# Patient Record
Sex: Male | Born: 1935 | Race: Black or African American | Hispanic: No | Marital: Married | Smoking: Former smoker
Health system: Southern US, Community
[De-identification: ages and names within clinical notes are randomized; demographics above are authoritative.]

## PROBLEM LIST (undated history)

## (undated) DIAGNOSIS — N189 Chronic kidney disease, unspecified: Secondary | ICD-10-CM

## (undated) DIAGNOSIS — M549 Dorsalgia, unspecified: Secondary | ICD-10-CM

## (undated) DIAGNOSIS — C801 Malignant (primary) neoplasm, unspecified: Secondary | ICD-10-CM

## (undated) DIAGNOSIS — E7800 Pure hypercholesterolemia, unspecified: Secondary | ICD-10-CM

## (undated) DIAGNOSIS — I10 Essential (primary) hypertension: Secondary | ICD-10-CM

## (undated) DIAGNOSIS — G8929 Other chronic pain: Secondary | ICD-10-CM

## (undated) DIAGNOSIS — I714 Abdominal aortic aneurysm, without rupture, unspecified: Secondary | ICD-10-CM

## (undated) HISTORY — DX: Abdominal aortic aneurysm, without rupture, unspecified: I71.40

## (undated) HISTORY — PX: MULTIPLE TOOTH EXTRACTIONS: SHX2053

---

## 2015-08-01 ENCOUNTER — Encounter: Payer: Self-pay | Admitting: Pulmonary Disease

## 2016-07-26 ENCOUNTER — Encounter (HOSPITAL_COMMUNITY): Payer: Self-pay | Admitting: Emergency Medicine

## 2016-07-26 ENCOUNTER — Ambulatory Visit (HOSPITAL_COMMUNITY)
Admission: EM | Admit: 2016-07-26 | Discharge: 2016-07-26 | Disposition: A | Discharge status: Home / Self Care | Payer: Medicare Other | Attending: Family Medicine | Admitting: Family Medicine

## 2016-07-26 DIAGNOSIS — R42 Dizziness and giddiness: Secondary | ICD-10-CM

## 2016-07-26 HISTORY — DX: Other chronic pain: G89.29

## 2016-07-26 HISTORY — DX: Dorsalgia, unspecified: M54.9

## 2016-07-26 HISTORY — DX: Pure hypercholesterolemia, unspecified: E78.00

## 2016-07-26 HISTORY — DX: Essential (primary) hypertension: I10

## 2016-07-26 MED ORDER — MECLIZINE HCL 12.5 MG PO TABS: 12.5000 mg | ORAL_TABLET | Freq: Three times a day (TID) | ORAL | 0 refills | Status: DC | PRN

## 2016-07-26 NOTE — ED Triage Notes (Signed)
The patient presented to the [facility] with a complaint of episodes of dizziness that started last week. The patient reported 2 episodes.

## 2016-07-26 NOTE — Discharge Instructions (Signed)
Follow up with your doctor if symptoms return

## 2016-07-26 NOTE — ED Provider Notes (Signed)
[place]    CSN: [id number] Arrival date & time: 07/26/16  1026     History   Chief Complaint Chief Complaint  Patient presents with  . Dizziness    HPI TARA Danny Duke is a 81 y.o. male.   This 81 year old man who presents to the [facility] urgent care center for evaluation of dizziness. He said he had a similar episode last week but it resolved after several hours. The episode that occurred this morning only lasted a few hours and is getting better. It was positional.  He's had no significant ear pain or loss of hearing. Said no fever. Said no head trauma.  The patient was out of town last week on the drive to [place].  Patient has associated problems of hypertension, chronic pain, and hyperlipidemia.      Past Medical History:  Diagnosis Date  . Chronic back pain   . Hypercholesterolemia   . Hypertension     There are no active problems to display for this patient.   History reviewed. No pertinent surgical history.     Home Medications    Prior to Admission medications   Medication Sig Start Date End Date Taking? Authorizing Provider  amLODipine (NORVASC) 10 MG tablet Take 10 mg by mouth daily.   Yes [provider]  lisinopril (PRINIVIL,ZESTRIL) 40 MG tablet Take 40 mg by mouth daily.   Yes [provider]  naproxen (NAPROSYN) 500 MG tablet Take 500 mg by mouth 2 (two) times daily with a meal.   Yes [provider]  oxycodone (ROXICODONE) 30 MG immediate release tablet Take 30 mg by mouth every 4 (four) hours as needed for pain.   Yes [provider]  simvastatin (ZOCOR) 20 MG tablet Take 20 mg by mouth daily.   Yes [provider]  meclizine (ANTIVERT) 12.5 MG tablet Take 1 tablet (12.5 mg total) by mouth 3 (three) times daily as needed for dizziness. 07/26/16   Robyn Haber, MD    Family History History reviewed. No pertinent family history.  Social History Social  History  Substance Use Topics  . Smoking status: Never Smoker  . Smokeless tobacco: Never Used  . Alcohol use No     Allergies   Patient has no known allergies.   Review of Systems Review of Systems  HENT: Negative.   Cardiovascular: Negative.   Neurological: Positive for dizziness.  All other systems reviewed and are negative.    Physical Exam Triage Vital Signs ED Triage Vitals  Enc Vitals Group     BP 07/26/16 1042 (!) 157/91     Pulse Rate 07/26/16 1042 85     Resp 07/26/16 1042 16     Temp 07/26/16 1042 98.2 F (36.8 C)     Temp Source 07/26/16 1042 Oral     SpO2 07/26/16 1042 99 %     Weight --      Height --      Head Circumference --      Peak Flow --      Pain Score 07/26/16 1047 0     Pain Loc --      Pain Edu? --      Excl. in [place]? --    No data found.   Updated Vital Signs BP (!) 157/91 (BP Location: Right Arm)   Pulse 85   Temp 98.2 F (36.8 C) (Oral)   Resp 16   SpO2 99%   Visual Acuity  Right Eye Distance:   Left Eye Distance:   Bilateral Distance:    Right Eye Near:   Left Eye Near:    Bilateral Near:     Physical Exam  Constitutional: He is oriented to person, place, and time. He appears well-developed and well-nourished.  HENT:  Right Ear: External ear normal.  Left Ear: External ear normal.  Mouth/Throat: Oropharynx is clear and moist.  Bilateral cerumen impaction  Terrible dentition  Eyes: Conjunctivae and EOM are normal. Pupils are equal, round, and reactive to light.  Neck: Normal range of motion. Neck supple.  Pulmonary/Chest: Effort normal.  Musculoskeletal: Normal range of motion.  Neurological: He is alert and oriented to person, place, and time. No cranial nerve deficit.  Skin: Skin is warm and dry.  Nursing note and vitals reviewed.    UC Treatments / Results  Labs (all labs ordered are listed, but only abnormal results are displayed) Labs Reviewed - No data to display  EKG  EKG Interpretation None         Radiology No results found.  Procedures Procedures (including critical care time)  Medications Ordered in UC Medications - No data to display   Initial Impression / Assessment and Plan / UC Course  I have reviewed the triage vital signs and the nursing notes.  Pertinent labs & imaging results that were available during my care of the patient were reviewed by me and considered in my medical decision making (see chart for details).     Final Clinical Impressions(s) / UC Diagnoses   Final diagnoses:  Dizziness    New Prescriptions New Prescriptions   MECLIZINE (ANTIVERT) 12.5 MG TABLET    Take 1 tablet (12.5 mg total) by mouth 3 (three) times daily as needed for dizziness.     Robyn Haber, MD 07/26/16 1148

## 2017-02-08 ENCOUNTER — Ambulatory Visit: Payer: Medicare Other | Admitting: Sports Medicine

## 2017-03-29 ENCOUNTER — Ambulatory Visit: Payer: Medicare Other | Admitting: Sports Medicine

## 2017-04-12 ENCOUNTER — Ambulatory Visit: Payer: Self-pay | Admitting: Sports Medicine

## 2017-06-06 ENCOUNTER — Other Ambulatory Visit: Payer: Self-pay | Admitting: Nurse Practitioner

## 2017-06-06 DIAGNOSIS — R9389 Abnormal findings on diagnostic imaging of other specified body structures: Secondary | ICD-10-CM

## 2017-06-16 ENCOUNTER — Ambulatory Visit
Admission: RE | Admit: 2017-06-16 | Discharge: 2017-06-16 | Disposition: A | Discharge status: Home / Self Care | Payer: Medicare Other | Source: Ambulatory Visit | Attending: Nurse Practitioner | Admitting: Nurse Practitioner

## 2017-06-16 DIAGNOSIS — R9389 Abnormal findings on diagnostic imaging of other specified body structures: Secondary | ICD-10-CM

## 2017-06-16 MED ORDER — IOPAMIDOL (ISOVUE-300) INJECTION 61%
75.0000 mL | Freq: Once | INTRAVENOUS | Status: AC | PRN
  Administered 2017-06-16: 75 mL via INTRAVENOUS

## 2017-06-20 ENCOUNTER — Other Ambulatory Visit (HOSPITAL_COMMUNITY): Payer: Self-pay | Admitting: Family Medicine

## 2017-06-20 ENCOUNTER — Other Ambulatory Visit (HOSPITAL_COMMUNITY): Payer: Self-pay | Admitting: Nurse Practitioner

## 2017-06-20 DIAGNOSIS — R9389 Abnormal findings on diagnostic imaging of other specified body structures: Secondary | ICD-10-CM

## 2017-06-20 DIAGNOSIS — R918 Other nonspecific abnormal finding of lung field: Secondary | ICD-10-CM

## 2017-06-28 ENCOUNTER — Other Ambulatory Visit (HOSPITAL_COMMUNITY): Payer: Self-pay | Admitting: Physician Assistant

## 2017-06-28 DIAGNOSIS — R918 Other nonspecific abnormal finding of lung field: Secondary | ICD-10-CM

## 2017-07-05 ENCOUNTER — Encounter (HOSPITAL_COMMUNITY)
Admission: RE | Admit: 2017-07-05 | Discharge: 2017-07-05 | Disposition: A | Discharge status: Home / Self Care | Payer: Medicare Other | Source: Ambulatory Visit | Attending: Physician Assistant | Admitting: Physician Assistant

## 2017-07-05 DIAGNOSIS — I898 Other specified noninfective disorders of lymphatic vessels and lymph nodes: Secondary | ICD-10-CM | POA: Diagnosis not present

## 2017-07-05 DIAGNOSIS — N2889 Other specified disorders of kidney and ureter: Secondary | ICD-10-CM | POA: Diagnosis not present

## 2017-07-05 DIAGNOSIS — M40294 Other kyphosis, thoracic region: Secondary | ICD-10-CM | POA: Diagnosis not present

## 2017-07-05 DIAGNOSIS — I700 Atherosclerosis of aorta: Secondary | ICD-10-CM | POA: Insufficient documentation

## 2017-07-05 DIAGNOSIS — J90 Pleural effusion, not elsewhere classified: Secondary | ICD-10-CM | POA: Diagnosis not present

## 2017-07-05 DIAGNOSIS — J432 Centrilobular emphysema: Secondary | ICD-10-CM | POA: Insufficient documentation

## 2017-07-05 DIAGNOSIS — J984 Other disorders of lung: Secondary | ICD-10-CM | POA: Diagnosis not present

## 2017-07-05 DIAGNOSIS — I2510 Atherosclerotic heart disease of native coronary artery without angina pectoris: Secondary | ICD-10-CM | POA: Insufficient documentation

## 2017-07-05 DIAGNOSIS — I714 Abdominal aortic aneurysm, without rupture: Secondary | ICD-10-CM | POA: Diagnosis not present

## 2017-07-05 DIAGNOSIS — R918 Other nonspecific abnormal finding of lung field: Secondary | ICD-10-CM | POA: Diagnosis present

## 2017-07-05 DIAGNOSIS — K7689 Other specified diseases of liver: Secondary | ICD-10-CM | POA: Insufficient documentation

## 2017-07-05 LAB — GLUCOSE, CAPILLARY: GLUCOSE-CAPILLARY: 98 mg/dL (ref 65–99)

## 2017-07-05 MED ORDER — FLUDEOXYGLUCOSE F - 18 (FDG) INJECTION
8.2200 | Freq: Once | INTRAVENOUS | Status: AC | PRN
  Administered 2017-07-05: 8.22 via INTRAVENOUS

## 2017-07-08 ENCOUNTER — Telehealth: Payer: Self-pay

## 2017-07-08 NOTE — Telephone Encounter (Signed)
That's fine

## 2017-07-08 NOTE — Telephone Encounter (Signed)
Schedule has apparently changed since previous message was sent as there are no openings on Tuesday schedule.  Ok to double book patient?  Thanks.

## 2017-07-08 NOTE — Telephone Encounter (Signed)
Called Danny Duke at [facility], and call was dropped during transfer.  Called back and lmtcb to schedule pt on Tuesday at 8:30 at [facility] approval.

## 2017-07-08 NOTE — Telephone Encounter (Signed)
Spoke with [place]. She is aware of patient's appt. She will contact patient's wife so he will be aware. Nothing else needed at time of call.

## 2017-07-08 NOTE — Telephone Encounter (Signed)
Kristeen Miss is calling back [phone]# [phone]

## 2017-07-08 NOTE — Telephone Encounter (Signed)
Spoke with Kristeen Miss, she stated that the patient needs an urgent referral to our office for newly discovered right lung mass. Advised her that we did not have any openings on Monday, but there are some openings on Tuesday that will require [place] to get permission from the providers. She verbalized understanding.   Dr. Melvyn Novas, you have some 15 min slots open on Tuesday. Please advise if you would be willing to see this patient. Thanks!

## 2017-07-08 NOTE — Telephone Encounter (Signed)
Created in error

## 2017-07-08 NOTE — Telephone Encounter (Signed)
Bring him in at 54 am as he can start working on paperwork then and we'll work him into the schedule when he's done

## 2017-07-12 ENCOUNTER — Ambulatory Visit (INDEPENDENT_AMBULATORY_CARE_PROVIDER_SITE_OTHER): Payer: Medicare Other | Admitting: Internal Medicine

## 2017-07-12 ENCOUNTER — Telehealth: Payer: Self-pay | Admitting: Internal Medicine

## 2017-07-12 ENCOUNTER — Encounter: Payer: Self-pay | Admitting: Internal Medicine

## 2017-07-12 VITALS — BP 130/80 | HR 66 | Ht 68.0 in | Wt 164.8 lb

## 2017-07-12 DIAGNOSIS — R918 Other nonspecific abnormal finding of lung field: Secondary | ICD-10-CM | POA: Diagnosis not present

## 2017-07-12 DIAGNOSIS — I10 Essential (primary) hypertension: Secondary | ICD-10-CM

## 2017-07-12 DIAGNOSIS — J449 Chronic obstructive pulmonary disease, unspecified: Secondary | ICD-10-CM

## 2017-07-12 LAB — SPIROMETRY WITH GRAPH

## 2017-07-12 NOTE — Patient Instructions (Signed)
Drop by those reports as soon as you can and we will be in touch to schedule a biopsy.
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
## 2017-07-12 NOTE — Telephone Encounter (Signed)
Called and spoke to pt's spouse, [name]. Ann states she called PCP office in [place] and requested that records be faxed to our office, as discuss with MW at today's visit.  I have checked with Magda Paganini and up front in [facility] folder, it does not appear that records have been received.  I have made Ann aware of this information. [name] states she will call back to PCP in [place] and request records again.

## 2017-07-13 ENCOUNTER — Encounter: Payer: Self-pay | Admitting: Internal Medicine

## 2017-07-13 DIAGNOSIS — I10 Essential (primary) hypertension: Secondary | ICD-10-CM | POA: Insufficient documentation

## 2017-07-13 DIAGNOSIS — J449 Chronic obstructive pulmonary disease, unspecified: Secondary | ICD-10-CM | POA: Insufficient documentation

## 2017-07-13 NOTE — Telephone Encounter (Signed)
Attempted to call the pt. I did not receive an answer. I have left a message for pt to return our call.  

## 2017-07-13 NOTE — Assessment & Plan Note (Signed)
Quit smoking 1999 - Spirometry 07/12/2017  FEV1 2.38 (97%)  Ratio 52    F/v contour is abnormal either related to effort and the fact that he is on ACE inhibitors as he appeared to have objective  wheezing during the study that he does not have otherwise but as he is minimally symptomatic at present I would actually favor stopping the ACE inhibitors over treating with bronchodilators at this point if he became more symptomatic based on:    When respiratory symptoms begin or become refractory well after a patient reports complete smoking cessation,  Especially when this wasn't the case while they were smoking, a red flag is raised based on the work of Dr Kris Mouton which states:  if you quit smoking when your best day FEV1 is still well preserved (as is the case here)  it is highly unlikely you will progress to severe disease.  That is to say, once the smoking stops,  the symptoms should not suddenly erupt or markedly worsen.  If so, the differential diagnosis should include  obesity/deconditioning,  LPR/Reflux/Aspiration syndromes,  occult CHF, or  especially side effect of medications commonly used in this population, especially ACEi.

## 2017-07-13 NOTE — Telephone Encounter (Signed)
Received a report for pt and placed into Dr. Gustavus Bryant box. Please advise.

## 2017-07-13 NOTE — Telephone Encounter (Signed)
Reviewed studies (the spot in new since 08/01/15 cxr)  and discussed with colleagues  Best / easiest option = ct guided bx rml mass by IR at [facility] Dr Laurence Ferrari approved

## 2017-07-13 NOTE — Assessment & Plan Note (Signed)
The right middle lobe mass and adjacent right middle lobe nodule are both highly hypermetabolic on today's examination. In addition, there is biapical scarring which is mildly hypermetabolic, and mildly hypermetabolic scattered lymph nodes in the mediastinum and hila which are partially calcified. Although malignancy is clearly a concern in the right middle lobe, part or all of the appearance could also be caused by active granulomatous disease  Calcifications in the nodes are strong indication of long-standing disease like sarcoid and therefore we should see some changes on previous evaluation that was done in [place] and should be readily available. If they did not include a CT scan and then restart back at square one with a question of does he have one disease or 2  : If we do a percutaneous biopsy of the mass which appears almost pleural-based and it turns up noncaseating granulomas inflammation then we don't need to sample the nodes. However, if it turns out to be tumor then the nodes will need  to be sampled as a part of the preoperative workup as he appears to be  a candidate for right middle lobectomy.  I explained this all to the patient and his wife in detail and they will try to obtain copies of previous x-ray reports and CT scans from [place] if possible.  Discussed in detail all the  indications, usual  risks and alternatives  relative to the benefits with patient who agrees to proceed with w/u as outlined.     Total time devoted to counseling  > 50 % of initial 60 min office visit:  review case with pt/ discussion of options/alternatives/ personally creating written customized instructions  in presence of pt  then going over those specific  Instructions directly with the pt including how to use all of the meds but in particular covering each new medication in detail and the difference between the maintenance= "automatic" meds and the prns using an action plan format for the  latter (If this problem/symptom => do that organization reading Left to right).  Please see AVS from this visit for a full list of these instructions which I personally wrote for this pt and  are unique to this visit.

## 2017-07-13 NOTE — Assessment & Plan Note (Signed)
He denies a cough or wheezing but had a very unusual upper airway "wheeze" that occurred during spirometry today and does suggest to me the possibility that he is having ACE inhibitor induced upper airway instability. We may need to consider substituting an alternative if this worsens at all clinically. He denies ever happened before the spirometry which actually did not show significant airflow obstruction at the time of his symptoms suggesting upper airway "wheezing" which is typical of an ACEi case.

## 2017-07-14 NOTE — Telephone Encounter (Signed)
Called and spoke to pt's wife. Informed her of the recs per MW. Order placed. Pt verbalized understanding and denied any further questions or concerns at this time.

## 2017-07-28 ENCOUNTER — Other Ambulatory Visit: Payer: Self-pay | Admitting: Radiology

## 2017-07-28 ENCOUNTER — Other Ambulatory Visit: Payer: Self-pay | Admitting: Student

## 2017-07-29 ENCOUNTER — Encounter (HOSPITAL_COMMUNITY): Payer: Self-pay

## 2017-07-29 ENCOUNTER — Ambulatory Visit (HOSPITAL_COMMUNITY)
Admission: RE | Admit: 2017-07-29 | Discharge: 2017-07-29 | Disposition: A | Discharge status: Home / Self Care | Payer: Medicare Other | Source: Ambulatory Visit | Attending: Internal Medicine | Admitting: Internal Medicine

## 2017-07-29 DIAGNOSIS — E7800 Pure hypercholesterolemia, unspecified: Secondary | ICD-10-CM | POA: Diagnosis not present

## 2017-07-29 DIAGNOSIS — Z9889 Other specified postprocedural states: Secondary | ICD-10-CM

## 2017-07-29 DIAGNOSIS — Z7982 Long term (current) use of aspirin: Secondary | ICD-10-CM | POA: Insufficient documentation

## 2017-07-29 DIAGNOSIS — Z79899 Other long term (current) drug therapy: Secondary | ICD-10-CM | POA: Insufficient documentation

## 2017-07-29 DIAGNOSIS — I10 Essential (primary) hypertension: Secondary | ICD-10-CM | POA: Insufficient documentation

## 2017-07-29 DIAGNOSIS — C342 Malignant neoplasm of middle lobe, bronchus or lung: Secondary | ICD-10-CM | POA: Diagnosis not present

## 2017-07-29 DIAGNOSIS — G8929 Other chronic pain: Secondary | ICD-10-CM | POA: Diagnosis not present

## 2017-07-29 DIAGNOSIS — R918 Other nonspecific abnormal finding of lung field: Secondary | ICD-10-CM

## 2017-07-29 DIAGNOSIS — Z87891 Personal history of nicotine dependence: Secondary | ICD-10-CM | POA: Diagnosis not present

## 2017-07-29 DIAGNOSIS — Z800 Family history of malignant neoplasm of digestive organs: Secondary | ICD-10-CM | POA: Diagnosis not present

## 2017-07-29 DIAGNOSIS — M549 Dorsalgia, unspecified: Secondary | ICD-10-CM | POA: Diagnosis not present

## 2017-07-29 LAB — PROTIME-INR
INR: 1.14
Prothrombin Time: 14.5 seconds (ref 11.4–15.2)

## 2017-07-29 LAB — CBC
HEMATOCRIT: 45.2 % (ref 39.0–52.0)
Hemoglobin: 14.8 g/dL (ref 13.0–17.0)
MCH: 31.2 pg (ref 26.0–34.0)
MCHC: 32.7 g/dL (ref 30.0–36.0)
MCV: 95.2 fL (ref 78.0–100.0)
Platelets: 130 10*3/uL — ABNORMAL LOW (ref 150–400)
RBC: 4.75 MIL/uL (ref 4.22–5.81)
RDW: 13 % (ref 11.5–15.5)
WBC: 7.1 10*3/uL (ref 4.0–10.5)

## 2017-07-29 LAB — APTT: APTT: 35 s (ref 24–36)

## 2017-07-29 MED ORDER — FENTANYL CITRATE (PF) 100 MCG/2ML IJ SOLN
INTRAMUSCULAR | Status: AC | PRN
  Administered 2017-07-29: 50 ug via INTRAVENOUS

## 2017-07-29 MED ORDER — SODIUM CHLORIDE 0.9 % IV SOLN
INTRAVENOUS | Status: AC | PRN
  Administered 2017-07-29: 10 mL/h via INTRAVENOUS

## 2017-07-29 MED ORDER — MIDAZOLAM HCL 2 MG/2ML IJ SOLN
INTRAMUSCULAR | Status: AC | PRN
  Administered 2017-07-29: 0.5 mg via INTRAVENOUS
  Administered 2017-07-29: 1 mg via INTRAVENOUS

## 2017-07-29 MED ORDER — SODIUM CHLORIDE 0.9 % IV SOLN: INTRAVENOUS | Status: DC

## 2017-07-29 NOTE — H&P (Signed)
Chief Complaint: Patient was seen in consultation today for lung mass  Referring Physician(s): Wert,Michael B  Supervising Physician: Jacqulynn Cadet  Patient Status: [facility] - Out-pt  History of Present Illness: ACEN CRAUN is a 82 y.o. male with past medical history of chronic back pain and hypertension who was found to have a lung mass at a recent routine physical.   CT Chest 06/16/17 showed: 1. Right middle lobe mass with spiculated margins. Mass measures 3 cm in greatest dimension, but is also contiguous with additional opacity extending to the anterior inferior aspect of the right middle lobe. Neoplasm is suspected. Tissue sampling is warranted. 2. Irregular nodular opacities in the right upper lobe extending to the apex. This may all be scarring. Cannot exclude any of the small nodular components as neoplastic disease. 3. Bilateral apical pleuroparenchymal scarring 4. Small noncalcified left lobe nodule measuring 4 mm. 5. Advanced emphysema. 6. Mild mediastinal and right hilar adenopathy. Multiple lymph nodes demonstrate calcifications consistent with changes from old, healed granulomatous disease. 7. Coronary artery calcifications  PET 07/05/17 showed: 1. The right middle lobe mass and adjacent right middle lobe nodule are both highly hypermetabolic on today's examination. In addition, there is biapical scarring which is mildly hypermetabolic, and mildly hypermetabolic scattered lymph nodes in the mediastinum and hila which are partially calcified. Although malignancy is clearly a concern in the right middle lobe, part or all of the appearance could also be caused by active granulomatous disease. The possibility of active granulomatous disease is somewhat emphasized in this case given that the biapical scarring and calcified lymph nodes would be more characteristic of a prior granulomatous process. That said, the right middle lobe lesions likely warrant sampling  as these lesions are substantially more metabolic than the rest of the findings. 2. Small right pleural effusion with trace pleural thickening and low-grade activity. 3. Fusiform infrarenal abdominal aortic aneurysm, 3.5 cm in diameter. Recommend followup by [place] in 2 years. This recommendation follows ACR consensus guidelines: White Paper of the ACR Incidental Findings Committee II on Vascular Findings. J Am Coll Radiol 2013; 10:789-794. 4. Other imaging findings of potential clinical significance: Aortic Atherosclerosis (ICD10-I70.0) and Emphysema (ICD10-J43.9). Coronary atherosclerosis. Hypodense hepatic and left renal lesions are likely cysts. Thoracic kyphosis.  IR consulted for lung mass biopsy at the request of Dr. Melvyn Novas.  Case reviewed by Dr. Laurence Ferrari who approves patient for procedure.   He presents to radiology department today without complaint.  He has been NPO.  He does not take blood thinners.    Past Medical History:  Diagnosis Date  . Chronic back pain   . Hypercholesterolemia   . Hypertension     History reviewed. No pertinent surgical history.  Allergies: Patient has no known allergies.  Medications: Prior to Admission medications   Medication Sig Start Date End Date Taking? Authorizing Provider  amLODipine (NORVASC) 10 MG tablet Take 10 mg by mouth daily.   Yes [provider]  aspirin EC 81 MG tablet Take 81 mg by mouth daily.   Yes [provider]  lisinopril (PRINIVIL,ZESTRIL) 40 MG tablet Take 40 mg by mouth daily.   Yes [provider]  meclizine (ANTIVERT) 12.5 MG tablet Take 1 tablet (12.5 mg total) by mouth 3 (three) times daily as needed for dizziness. 07/26/16  Yes Robyn Haber, MD  oxycodone (ROXICODONE) 30 MG immediate release tablet Take 30 mg by mouth 4 (four) times daily.    Yes [provider]  simvastatin (ZOCOR) 20 MG  tablet Take 20 mg by mouth daily.   Yes [provider]  naproxen (NAPROSYN)  500 MG tablet Take 500 mg by mouth 2 (two) times daily as needed.     [provider]     Family History  Problem Relation Age of Onset  . Colon cancer Brother   . Stomach cancer Brother     Social History   Socioeconomic History  . Marital status: Married    Spouse name: Not on file  . Number of children: Not on file  . Years of education: Not on file  . Highest education level: Not on file  Occupational History  . Not on file  Social Needs  . Financial resource strain: Not on file  . Food insecurity:    Worry: Not on file    Inability: Not on file  . Transportation needs:    Medical: Not on file    Non-medical: Not on file  Tobacco Use  . Smoking status: Former Smoker    Packs/day: 0.50    Years: 40.00    Pack years: 20.00    Last attempt to quit: 03/08/1997    Years since quitting: 20.4  . Smokeless tobacco: Never Used  Substance and Sexual Activity  . Alcohol use: No  . Drug use: No  . Sexual activity: Not on file  Lifestyle  . Physical activity:    Days per week: Not on file    Minutes per session: Not on file  . Stress: Not on file  Relationships  . Social connections:    Talks on phone: Not on file    Gets together: Not on file    Attends religious service: Not on file    Active member of club or organization: Not on file    Attends meetings of clubs or organizations: Not on file    Relationship status: Not on file  Other Topics Concern  . Not on file  Social History Narrative  . Not on file     Review of Systems: A 12 point ROS discussed and pertinent positives are indicated in the HPI above.  All other systems are negative.  Review of Systems  Constitutional: Negative for fatigue and fever.  Respiratory: Negative for cough and shortness of breath.   Cardiovascular: Negative for chest pain.  Gastrointestinal: Negative for abdominal pain.  Musculoskeletal: Negative for back pain.  Psychiatric/Behavioral: Negative for behavioral problems  and confusion.    Vital Signs: BP 130/84   Pulse 76   Temp (!) 97.4 F (36.3 C)   Ht 5\' 8"  (1.727 m)   Wt 164 lb (74.4 kg)   SpO2 100%   BMI 24.94 kg/m   Physical Exam  Constitutional: He is oriented to person, place, and time. He appears well-developed.  Cardiovascular: Normal rate, regular rhythm and normal heart sounds.  Pulmonary/Chest: Effort normal and breath sounds normal. No respiratory distress.  Abdominal: Soft. He exhibits no distension. There is no tenderness.  Neurological: He is alert and oriented to person, place, and time.  Skin: Skin is warm and dry.  Psychiatric: He has a normal mood and affect. His behavior is normal. Judgment and thought content normal.  Nursing note and vitals reviewed.    MD Evaluation Airway: WNL Heart: WNL Abdomen: WNL Chest/ Lungs: WNL ASA  Classification: 3 Mallampati/Airway Score: One   Imaging: Nm Pet Image Initial (pi) Skull Base To Thigh  Result Date: 07/05/2017 CLINICAL DATA:  Initial treatment strategy for right middle lobe lung  mass. EXAM: NUCLEAR MEDICINE PET SKULL BASE TO THIGH TECHNIQUE: 8.2 mCi F-18 FDG was injected intravenously. Full-ring PET imaging was performed from the skull base to thigh after the radiotracer. CT data was obtained and used for attenuation correction and anatomic localization. Fasting blood glucose: 98 mg/dl COMPARISON:  Chest CT from 06/16/2017 FINDINGS: Mediastinal blood pool activity: SUV max 2.3 NECK: Substantial misregistration of PET and CT data in the head and neck region, I did pay special attention to the non attenuation corrected data because of this. No significant abnormal accentuated metabolic activity in the neck. Incidental CT findings: None CHEST: Spiculated right middle lobe mass 3.4 by 2.5 cm, previously 3.2 by 2.8 cm by my measurements. This has a maximum SUV of 20.1. An adjacent right middle lobe nodule measuring approximately 1.9 by 1.4 cm on image 48/8 has a maximum SUV of 20.9.  There is hypermetabolic right paratracheal, prevascular, left paratracheal, bilateral hilar, bilateral infrahilar, and subcarinal adenopathy. Index right paratracheal node measuring 1.6 cm in short axis on image 51/4 has a maximum SUV of 4.6. Right hilar adenopathy with speckled calcifications has a maximum SUV of 8.2. Small right pleural effusion with trace pleural thickening and very low-level activity with SUV of about 2.0. Indistinct and irregular biapical densities with some faint calcifications and mild accentuated metabolic activity, maximum SUV 4.5 on the right and 5.3 on the left. Incidental CT findings: Severe centrilobular emphysema. Coronary, aortic arch, and branch vessel atherosclerotic vascular disease. ABDOMEN/PELVIS: Indistinctly marginated portacaval lymph node has mildly accentuated metabolic activity with maximum SUV 5.0, and short axis diameter estimated at 1.0 cm. Incidental CT findings: Nonspecific 0.8 cm hypodense lesion inferiorly in the right hepatic lobe without accentuated metabolic activity. Photopenic fluid density lesion of the left kidney upper pole favoring cyst. Similar low-density lesion of the left kidney lower pole. Infrarenal abdominal aortic aneurysm with mural thrombus, measuring 3.5 by 3.4 cm on image 120/4. SKELETON: No hypermetabolic skeletal metastatic lesion is identified. Incidental CT findings: Thoracic kyphosis. Lower lumbar degenerative facet arthropathy. IMPRESSION: 1. The right middle lobe mass and adjacent right middle lobe nodule are both highly hypermetabolic on today's examination. In addition, there is biapical scarring which is mildly hypermetabolic, and mildly hypermetabolic scattered lymph nodes in the mediastinum and hila which are partially calcified. Although malignancy is clearly a concern in the right middle lobe, part or all of the appearance could also be caused by active granulomatous disease. The possibility of active granulomatous disease is  somewhat emphasized in this case given that the biapical scarring and calcified lymph nodes would be more characteristic of a prior granulomatous process. That said, the right middle lobe lesions likely warrant sampling as these lesions are substantially more metabolic than the rest of the findings. 2. Small right pleural effusion with trace pleural thickening and low-grade activity. 3. Fusiform infrarenal abdominal aortic aneurysm, 3.5 cm in diameter. Recommend followup by [place] in 2 years. This recommendation follows ACR consensus guidelines: White Paper of the ACR Incidental Findings Committee II on Vascular Findings. J Am Coll Radiol 2013; 10:789-794. 4. Other imaging findings of potential clinical significance: Aortic Atherosclerosis (ICD10-I70.0) and Emphysema (ICD10-J43.9). Coronary atherosclerosis. Hypodense hepatic and left renal lesions are likely cysts. Thoracic kyphosis. Electronically Signed   By: Van Clines M.D.   On: 07/05/2017 15:40    Labs:  CBC: Recent Labs    07/29/17 1008  WBC 7.1  HGB 14.8  HCT 45.2  PLT PENDING    COAGS: No results for input(s): INR,  APTT in the last 8760 hours.  BMP: No results for input(s): NA, K, CL, CO2, GLUCOSE, BUN, CALCIUM, CREATININE, GFRNONAA, GFRAA in the last 8760 hours.  Invalid input(s): CMP  LIVER FUNCTION TESTS: No results for input(s): BILITOT, AST, ALT, ALKPHOS, PROT, ALBUMIN in the last 8760 hours.  TUMOR MARKERS: No results for input(s): AFPTM, CEA, CA199, CHROMGRNA in the last 8760 hours.  Assessment and Plan: Patient with past medical history of HTN presents with complaint of lung mass.  IR consulted for lung mass biopsy at the request of Dr. Melvyn Novas. Case reviewed by Dr. Laurence Ferrari who approves patient for procedure.  Patient presents today in their usual state of health.  He has been NPO and is not currently on blood thinners.   Risks and benefits discussed with the patient including, but not limited to bleeding,  hemoptysis, respiratory failure requiring intubation, infection, pneumothorax requiring chest tube placement, stroke from air embolism or even death.  All of the patient's questions were answered, patient is agreeable to proceed. Consent signed and in chart.  Thank you for this interesting consult.  I greatly enjoyed meeting DEANGLO HISSONG and look forward to participating in their care.  A copy of this report was sent to the requesting provider on this date.  Electronically Signed: Docia Barrier, PA 07/29/2017, 10:40 AM   I spent a total of  30 Minutes   in face to face in clinical consultation, greater than 50% of which was counseling/coordinating care for lung mass.

## 2017-07-29 NOTE — Procedures (Signed)
Interventional Radiology Procedure Note  Procedure: CT guided biopsy of RML cavitary mass Complications: No immediate Recommendations: - Bedrest until CXR cleared.  Minimize talking, coughing or otherwise straining.  - Follow up 2 hr CXR pending   Signed,  Criselda Peaches, MD

## 2017-07-29 NOTE — Discharge Instructions (Signed)
Needle Biopsy of the Lung, Care After °This sheet gives you information about how to care for yourself after your procedure. Your health care provider may also give you more specific instructions. If you have problems or questions, contact your health care provider. °What can I expect after the procedure? °After the procedure, it is common to have: °· Soreness, pain, and tenderness where a tissue sample was taken (biopsy site). °· A cough. °· A sore throat. ° °Follow these instructions at home: °Biopsy site care °· Follow instructions from your health care provider about when to remove the bandage that was placed on the biopsy site. °· Keep the bandage dry until it has been removed. °· Check your biopsy site every day for signs of infection. Check for: °? More redness, swelling, or pain. °? More fluid or blood. °? Warmth to the touch. °? Pus or a bad smell. °General instructions °· Rest as directed by your health care provider. Ask your health care provider what activities are safe for you. °· Do not take baths, swim, or use a hot tub until your health care provider approves. °· Take over-the-counter and prescription medicines only as told by your health care provider. °· If you have airplane travel scheduled, talk with your health care provider about when it is safe for you to travel by airplane. °· It is up to you to get the results of your procedure. Ask your health care provider, or the department that is doing the procedure, when your results will be ready. °· Keep all follow-up visits as told by your health care provider. This is important. °Contact a health care provider if: °· You have more redness, swelling, or pain around your biopsy site. °· You have more fluid or blood coming from your biopsy site. °· Your biopsy site feels warm to the touch. °· You have pus or a bad smell coming from your biopsy site. °· You have a fever. °· You have pain that does not get better with medicine. °Get help right away  if: °· You have problems breathing. °· You have chest pain. °· You cough up blood. °· You faint. °· You have a fast heart rate. °Summary °· After a needle biopsy of the lung, it is common to have a cough, a sore throat, or soreness, pain, and tenderness where a tissue sample was taken (biopsy site). °· You should check your biopsy area every day for signs of infection, including pus or a bad smell, warmth, more fluid or blood, or more redness, swelling, or pain. °· You should not take baths, swim, or use a hot tub until your health care provider approves. °· It is up to you to get the results of your procedure. Ask your health care provider, or the department that is doing the procedure, when your results will be ready. °This information is not intended to replace advice given to you by your health care provider. Make sure you discuss any questions you have with your health care provider. °Document Released: 12/20/2006 Document Revised: 01/14/2016 Document Reviewed: 01/14/2016 °Elsevier Interactive Patient Education © 2017 [employer] ° ° °

## 2017-08-04 ENCOUNTER — Encounter (HOSPITAL_COMMUNITY): Payer: Self-pay | Admitting: Emergency Medicine

## 2017-08-04 ENCOUNTER — Other Ambulatory Visit: Payer: Self-pay

## 2017-08-05 ENCOUNTER — Encounter (HOSPITAL_COMMUNITY): Payer: Self-pay | Admitting: Anesthesiology

## 2017-08-05 ENCOUNTER — Ambulatory Visit (HOSPITAL_COMMUNITY): Payer: Medicare Other | Admitting: Anesthesiology

## 2017-08-05 ENCOUNTER — Ambulatory Visit (HOSPITAL_COMMUNITY)
Admission: RE | Admit: 2017-08-05 | Discharge: 2017-08-05 | Disposition: A | Discharge status: Home / Self Care | Payer: Medicare Other | Source: Ambulatory Visit | Attending: Pulmonary Disease | Admitting: Pulmonary Disease

## 2017-08-05 ENCOUNTER — Other Ambulatory Visit: Payer: Self-pay

## 2017-08-05 ENCOUNTER — Encounter (HOSPITAL_COMMUNITY)
Admission: RE | Disposition: A | Discharge status: Home / Self Care | Payer: Self-pay | Source: Ambulatory Visit | Attending: Pulmonary Disease

## 2017-08-05 DIAGNOSIS — M1990 Unspecified osteoarthritis, unspecified site: Secondary | ICD-10-CM | POA: Insufficient documentation

## 2017-08-05 DIAGNOSIS — E7800 Pure hypercholesterolemia, unspecified: Secondary | ICD-10-CM | POA: Diagnosis not present

## 2017-08-05 DIAGNOSIS — C3491 Malignant neoplasm of unspecified part of right bronchus or lung: Secondary | ICD-10-CM

## 2017-08-05 DIAGNOSIS — I10 Essential (primary) hypertension: Secondary | ICD-10-CM | POA: Diagnosis not present

## 2017-08-05 DIAGNOSIS — J449 Chronic obstructive pulmonary disease, unspecified: Secondary | ICD-10-CM | POA: Diagnosis not present

## 2017-08-05 DIAGNOSIS — Z87891 Personal history of nicotine dependence: Secondary | ICD-10-CM | POA: Insufficient documentation

## 2017-08-05 DIAGNOSIS — C342 Malignant neoplasm of middle lobe, bronchus or lung: Secondary | ICD-10-CM | POA: Diagnosis not present

## 2017-08-05 DIAGNOSIS — R590 Localized enlarged lymph nodes: Secondary | ICD-10-CM | POA: Insufficient documentation

## 2017-08-05 HISTORY — PX: BRONCHIAL NEEDLE ASPIRATION BIOPSY: SHX5106

## 2017-08-05 HISTORY — PX: ENDOBRONCHIAL ULTRASOUND: SHX5096

## 2017-08-05 SURGERY — ENDOBRONCHIAL ULTRASOUND (EBUS)
Anesthesia: General | Laterality: Bilateral

## 2017-08-05 MED ORDER — ROCURONIUM BROMIDE 10 MG/ML (PF) SYRINGE
PREFILLED_SYRINGE | INTRAVENOUS | Status: DC | PRN
  Administered 2017-08-05: 50 mg via INTRAVENOUS

## 2017-08-05 MED ORDER — PROPOFOL 10 MG/ML IV BOLUS
INTRAVENOUS | Status: DC | PRN
  Administered 2017-08-05: 200 mg via INTRAVENOUS

## 2017-08-05 MED ORDER — ONDANSETRON HCL 4 MG/2ML IJ SOLN
INTRAMUSCULAR | Status: DC | PRN
  Administered 2017-08-05: 4 mg via INTRAVENOUS

## 2017-08-05 MED ORDER — GLYCOPYRROLATE PF 0.2 MG/ML IJ SOSY
PREFILLED_SYRINGE | INTRAMUSCULAR | Status: DC | PRN
  Administered 2017-08-05: .2 mg via INTRAVENOUS

## 2017-08-05 MED ORDER — SUGAMMADEX SODIUM 200 MG/2ML IV SOLN
INTRAVENOUS | Status: DC | PRN
  Administered 2017-08-05: 200 mg via INTRAVENOUS

## 2017-08-05 MED ORDER — DEXAMETHASONE SODIUM PHOSPHATE 10 MG/ML IJ SOLN
INTRAMUSCULAR | Status: DC | PRN
  Administered 2017-08-05: 10 mg via INTRAVENOUS

## 2017-08-05 MED ORDER — PROPOFOL 10 MG/ML IV BOLUS
INTRAVENOUS | Status: AC
  Filled 2017-08-05: qty 20

## 2017-08-05 MED ORDER — FENTANYL CITRATE (PF) 100 MCG/2ML IJ SOLN
INTRAMUSCULAR | Status: AC
  Filled 2017-08-05: qty 2

## 2017-08-05 MED ORDER — LIDOCAINE 2% (20 MG/ML) 5 ML SYRINGE
INTRAMUSCULAR | Status: DC | PRN
  Administered 2017-08-05: 100 mg via INTRAVENOUS

## 2017-08-05 MED ORDER — LACTATED RINGERS IV SOLN
INTRAVENOUS | Status: DC
  Administered 2017-08-05: 1000 mL via INTRAVENOUS

## 2017-08-05 MED ORDER — FENTANYL CITRATE (PF) 100 MCG/2ML IJ SOLN
INTRAMUSCULAR | Status: DC | PRN
  Administered 2017-08-05 (×2): 25 ug via INTRAVENOUS
  Administered 2017-08-05: 50 ug via INTRAVENOUS

## 2017-08-05 NOTE — Anesthesia Procedure Notes (Signed)
Procedure Name: Intubation Date/Time: 08/05/2017 12:58 PM Performed by: Lavina Hamman, CRNA Pre-anesthesia Checklist: Patient identified, Emergency Drugs available, Suction available, Patient being monitored and Timeout performed Patient Re-evaluated:Patient Re-evaluated prior to induction Oxygen Delivery Method: Circle system utilized Preoxygenation: Pre-oxygenation with 100% oxygen Induction Type: IV induction Ventilation: Mask ventilation without difficulty Laryngoscope Size: Mac and 4 Grade View: Grade II Tube type: Oral Tube size: 9.0 mm Number of attempts: 1 Airway Equipment and Method: Stylet Placement Confirmation: ETT inserted through vocal cords under direct vision,  positive ETCO2,  CO2 detector and breath sounds checked- equal and bilateral Secured at: 21 cm Tube secured with: Tape Dental Injury: Teeth and Oropharynx as per pre-operative assessment

## 2017-08-05 NOTE — Discharge Instructions (Signed)
Lung Biopsy, Care After °This sheet gives you information about how to care for yourself after your procedure. Your health care provider may also give you more specific instructions depending on the type of biopsy you had. If you have problems or questions, contact your health care provider. °What can I expect after the procedure? °After the procedure, it is common to have: °· A cough. °· A sore throat. °· Pain where a needle, bronchoscope, or incision was used to collect a biopsy sample (biopsy site). ° °Follow these instructions at home: °Medicines °· Take over-the-counter and prescription medicines only as told by your health care provider. °· Do not drive for 24 hours if you were given a sedative. °· Do not drink alcohol while taking pain medicine. °· Do not drive or use heavy machinery while taking prescription pain medicine. °· To prevent or treat constipation while you are taking prescription pain medicine, your health care provider may recommend that you: °? Drink enough fluid to keep your urine clear or pale yellow. °? Take over-the-counter or prescription medicines. °? Eat foods that are high in fiber, such as fresh fruits and vegetables, whole grains, and beans. °? Limit foods that are high in fat and processed sugars, such as fried and sweet foods. °Activity °· If you had an incision during your procedure, avoid activities that may pull the incision site open. °· Return to your normal activities as told by your health care provider. Ask your health care provider what activities are safe for you. °If you had an open biopsy:  °· Follow instructions from your health care provider about how to take care of your incision. Make sure you: °? Wash your hands with soap and water before you change your bandage (dressing). If soap and water are not available, use hand sanitizer. °? Change your dressing as told by your health care provider. °? Leave stitches (sutures), skin glue, or adhesive strips in place. These  skin closures may need to stay in place for 2 weeks or longer. If adhesive strip edges start to loosen and curl up, you may trim the loose edges. Do not remove adhesive strips completely unless your health care provider tells you to do that. °· Check your incision area every day for signs of infection. Check for: °? Redness, swelling, or pain. °? Fluid or blood. °? Warmth. °? Pus or a bad smell. °General instructions °· It is up to you to get the results of your procedure. Ask your health care provider, or the department that is doing the procedure, when your results will be ready. °Contact a health care provider if: °· You have a fever. °· You have redness, swelling, or pain around your biopsy site. °· You have fluid or blood coming from your biopsy site. °· Your biopsy site feels warm to the touch. °· You have pus or a bad smell coming from your biopsy site. °Get help right away if: °· You cough up blood. °· You have trouble breathing. °· You have chest pain. °Summary °· After the procedure, it is common to have a sore throat and a cough. °· Return to your normal activities as told by your health care provider. Ask your health care provider what activities are safe for you. °· Take over-the-counter and prescription medicines only as told by your health care provider. °· Report any unusual symptoms to your health care provider. °This information is not intended to replace advice given to you by your health care provider. Make sure   you discuss any questions you have with your health care provider. °Document Released: 03/23/2016 Document Revised: 03/23/2016 Document Reviewed: 03/23/2016 °Elsevier Interactive Patient Education © 2018 [employer] ° °

## 2017-08-05 NOTE — Op Note (Signed)
[facility] Cardiopulmonary Patient Name: Danny Duke Procedure Date: 08/05/2017 MRN: [id number] Attending MD: Juanito Doom , MD Date of Birth: [birth year] CSN: [id number] Age: 82 Admit Type: Outpatient Ethnicity: Not Hispanic or Latino Procedure:            Bronchoscopy Indications:          Mediastinal adenopathy, Known lung cancer of the right                        middle lobe Providers:            Juanito Doom, MD, Cleda Daub, RN, Tinnie Gens,                        Technician, Phillis Knack RRT, RCP, Arnoldo Hooker, CRNA Referring MD:          Medicines:            General Anesthesia Complications:        No immediate complications Estimated Blood Loss: Estimated blood loss was minimal. Procedure:      Pre-Anesthesia Assessment:      - A History and Physical has been performed. The patient's medications,       allergies and sensitivities have been reviewed.      - The risks and benefits of the procedure and the sedation options and       risks were discussed with the patient. All questions were answered and       informed consent was obtained.      - CV Examination: normal.      - Respiratory Examination: clear to auscultation.      - Mental Status Examination: alert and oriented.      After obtaining informed consent, the bronchoscope was passed under       direct vision. Throughout the procedure, the patient's blood pressure,       pulse, and oxygen saturations were monitored continuously. the [id number]       ( [id number]) scope was introduced through the mouth, via the endotracheal       tube and advanced to the tracheobronchial tree. the [id number]       scope was introduced through the mouth, via the endotracheal tube and       advanced to the tracheobronchial tree of both lungs. The procedure was       accomplished without difficulty. The patient tolerated the procedure       well. The total duration of the procedure was 1 hour (and 0 minutes).        The procedure was accomplished without difficulty. Findings:      The endotracheal tube is in good position. The visualized portion of the       trachea is of normal caliber. The carina is sharp. The tracheobronchial       tree was examined to at least the first subsegmental level. Bronchial       mucosa and anatomy are normal; there are no endobronchial lesions, and       no secretions.      An endobronchial ultrasound endoscope was utilized to visualize the       lymphadenopathy. As expected there was adenopathy seen at 4R and 11R as       noted on the CT scan. In the subcarinal area there was a well       circumscribed area of abnormal tissue that was  non-vascular but not of       echodensity typical for lymphadenopathy. Using EBUS, I performed fine       needle aspiration of the 4R lymph node and the area of tissue in the       subcarinal region.      Transbronchial needle aspirations of a lymphadenopathy were performed in       the right paratracheal area and in the subcarinal tissue collection       using a fine needle and sent for routine cytology. The procedure was       guided by ultrasound. 8 were obtained from each lymph node. 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      - Endobronchial ultrasound was performed.      - A transbronchial needle aspiration was performed of 4R and an abnormal       collection of cells in the subcarinal area.      - preliminary report from pathology showed lymph tissue without       malignancy at 4R and mostly blood and epithelial cells from the       subcarinal tissue, suspect this is a cyst of some kind. Moderate Sedation:      General Anesthesia administered by Anesthesiologist Recommendation:      - Await cytology results. Procedure Code(s):      --- Professional ---      [phone], Bronchoscopy, rigid or flexible, including  fluoroscopic guidance,       when performed; with transbronchial needle aspiration biopsy(s),       trachea, main stem and/or lobar bronchus(i)      28315, Bronchoscopy, rigid or flexible, including fluoroscopic guidance,       when performed; with transendoscopic endobronchial ultrasound (EBUS)       during bronchoscopic diagnostic or therapeutic intervention(s) for       peripheral lesion(s) (List separately in addition to code for primary       procedure[s]) Diagnosis Code(s):      --- Professional ---      R59.0, Localized enlarged lymph nodes      C34.2, Malignant neoplasm of middle lobe, bronchus or lung CPT copyright 2017 [employer]. All rights reserved. The codes documented in this report are preliminary and upon coder review may  be revised to meet current compliance requirements. Norlene Campbell, MD Juanito Doom, MD 08/05/2017 2:32:18 PM This report has been signed electronically. Number of Addenda: 0 Scope In: Scope Out:

## 2017-08-05 NOTE — H&P (Signed)
[facility]  HPI: Danny Duke is a former smoker with a R middle lobe mass which was biopsy proven to show squamous cell carcinoma earlier this week.  He also has mediastinal lymphadenopathy.  He is here today for a staging EBUS FNA of his mediastinal lymph nodes.  Past Medical History:  Diagnosis Date  . Chronic back pain   . Hypercholesterolemia   . Hypertension      Family History  Problem Relation Age of Onset  . Colon cancer Brother   . Stomach cancer Brother      Social History   Socioeconomic History  . Marital status: Married    Spouse name: Not on file  . Number of children: Not on file  . Years of education: Not on file  . Highest education level: Not on file  Occupational History  . Not on file  Social Needs  . Financial resource strain: Not on file  . Food insecurity:    Worry: Not on file    Inability: Not on file  . Transportation needs:    Medical: Not on file    Non-medical: Not on file  Tobacco Use  . Smoking status: Former Smoker    Packs/day: 0.50    Years: 40.00    Pack years: 20.00    Last attempt to quit: 03/08/1997    Years since quitting: 20.4  . Smokeless tobacco: Never Used  Substance and Sexual Activity  . Alcohol use: No  . Drug use: No  . Sexual activity: Not on file  Lifestyle  . Physical activity:    Days per week: Not on file    Minutes per session: Not on file  . Stress: Not on file  Relationships  . Social connections:    Talks on phone: Not on file    Gets together: Not on file    Attends religious service: Not on file    Active member of club or organization: Not on file    Attends meetings of clubs or organizations: Not on file    Relationship status: Not on file  . Intimate partner violence:    Fear of current or ex partner: Not on file    Emotionally abused: Not on file    Physically abused: Not on file    Forced sexual activity: Not on file  Other Topics Concern  . Not on file  Social History Narrative  . Not on file      No Known Allergies   @[email or web address] @  Vitals:   08/05/17 1113  BP: (!) 147/81  Resp: 14  Temp: 98.9 F (37.2 C)  TempSrc: Oral  SpO2: 99%   General:  Resting comfortably in bed HENT: NCAT OP clear PULM: CTA B, normal effort CV: RRR, no mgr GI: BS+, soft, nontender MSK: normal bulk and tone Neuro: awake, alert, no distress, MAEW  CT chest / PET CT reviewed: there are nodules in the right lung.  There is hilar adenopathy with calcification, there is a 4R lymph node which is 1.5 cm in size  Impression: Squamous cell carcinoma  Mediastinal lymphadenopathy  Plan: Staging EBUS with FNA of lymph nodes Patient and family understand risks and benefits  Roselie Awkward, MD [place] [facility] Pager: [phone] Cell: [phone] After 3pm or if no response, call [phone]

## 2017-08-05 NOTE — Anesthesia Preprocedure Evaluation (Addendum)
Anesthesia Evaluation  Patient identified by MRN, date of birth, ID band Patient awake    Reviewed: Allergy & Precautions, H&P , NPO status , Patient's Chart, lab work & pertinent test results, reviewed documented beta blocker date and time   Airway Mallampati: II  TM Distance: >3 FB Neck ROM: full    Dental no notable dental hx. (+) Poor Dentition, Chipped, Missing,    Pulmonary COPD, former smoker,    Pulmonary exam normal breath sounds clear to auscultation       Cardiovascular Exercise Tolerance: Good hypertension, Pt. on medications  Rhythm:regular Rate:Normal     Neuro/Psych negative neurological ROS  negative psych ROS   GI/Hepatic negative GI ROS, Neg liver ROS,   Endo/Other  negative endocrine ROS  Renal/GU negative Renal ROS  negative genitourinary   Musculoskeletal  (+) Arthritis , Osteoarthritis,    Abdominal   Peds  Hematology negative hematology ROS (+)   Anesthesia Other Findings   Reproductive/Obstetrics negative OB ROS                           Anesthesia Physical Anesthesia Plan  ASA: II  Anesthesia Plan: General   Post-op Pain Management:    Induction: Intravenous  PONV Risk Score and Plan: 2 and Ondansetron and Treatment may vary due to age or medical condition  Airway Management Planned: Oral ETT and LMA  Additional Equipment:   Intra-op Plan:   Post-operative Plan: Extubation in OR  Informed Consent: I have reviewed the patients History and Physical, chart, labs and discussed the procedure including the risks, benefits and alternatives for the proposed anesthesia with the patient or authorized representative who has indicated his/her understanding and acceptance.   Dental Advisory Given  Plan Discussed with: CRNA, Anesthesiologist and Surgeon  Anesthesia Plan Comments: (  )       Anesthesia Quick Evaluation

## 2017-08-05 NOTE — Transfer of Care (Signed)
Immediate Anesthesia Transfer of Care Note  Patient: Danny Duke  Procedure(s) Performed: Procedure(s): ENDOBRONCHIAL ULTRASOUND (Bilateral) BRONCHIAL NEEDLE ASPIRATION BIOPSIES  Patient Location: Endoscopy Unit  Anesthesia Type:General  Level of Consciousness:  sedated, patient cooperative and responds to stimulation  Airway & Oxygen Therapy:Patient Spontanous Breathing and Patient connected to face mask oxgen  Post-op Assessment:  Report given to PACU RN and Post -op Vital signs reviewed and stable  Post vital signs:  Reviewed and stable  Last Vitals:  Vitals:   08/05/17 1113  BP: (!) 147/81  Resp: 14  Temp: 37.2 C  SpO2: 81%    Complications: No apparent anesthesia complications

## 2017-08-07 NOTE — Anesthesia Postprocedure Evaluation (Signed)
Anesthesia Post Note  Patient: Danny Duke  Procedure(s) Performed: ENDOBRONCHIAL ULTRASOUND (Bilateral ) BRONCHIAL NEEDLE ASPIRATION BIOPSIES     Patient location during evaluation: PACU Anesthesia Type: General Level of consciousness: awake and alert Pain management: pain level controlled Vital Signs Assessment: post-procedure vital signs reviewed and stable Respiratory status: spontaneous breathing, nonlabored ventilation, respiratory function stable and patient connected to nasal cannula oxygen Cardiovascular status: blood pressure returned to baseline and stable Postop Assessment: no apparent nausea or vomiting Anesthetic complications: no    Last Vitals:  Vitals:   08/05/17 1410 08/05/17 1425  BP: (!) 128/97 (!) 137/97  Pulse: (!) 108 95  Resp: 20 17  Temp: 36.6 C   SpO2: 97% 98%    Last Pain:  Vitals:   08/05/17 1410  TempSrc: Oral  PainSc: 0-No pain                 Marlane Hirschmann

## 2017-08-08 ENCOUNTER — Encounter (HOSPITAL_COMMUNITY): Payer: Self-pay | Admitting: Pulmonary Disease

## 2017-08-09 ENCOUNTER — Other Ambulatory Visit: Payer: Self-pay | Admitting: Internal Medicine

## 2017-08-09 DIAGNOSIS — R918 Other nonspecific abnormal finding of lung field: Secondary | ICD-10-CM

## 2017-08-09 NOTE — Progress Notes (Signed)
Referral was sent to [facility]

## 2017-08-18 ENCOUNTER — Institutional Professional Consult (permissible substitution) (INDEPENDENT_AMBULATORY_CARE_PROVIDER_SITE_OTHER): Payer: Medicare Other | Admitting: Thoracic Surgery (Cardiothoracic Vascular Surgery)

## 2017-08-18 ENCOUNTER — Encounter: Payer: Self-pay | Admitting: Thoracic Surgery (Cardiothoracic Vascular Surgery)

## 2017-08-18 ENCOUNTER — Other Ambulatory Visit: Payer: Self-pay

## 2017-08-18 VITALS — BP 137/84 | HR 81 | Resp 18 | Ht 68.0 in | Wt 161.4 lb

## 2017-08-18 DIAGNOSIS — R918 Other nonspecific abnormal finding of lung field: Secondary | ICD-10-CM

## 2017-08-18 NOTE — Progress Notes (Signed)
PCP is [place], Erline Levine, [place] Referring Provider is Tanda Rockers, MD  Chief Complaint  Patient presents with  . Lung Mass    new patient, Chest CT 06/16/2017, PET 07/05/2017    HPI: Danny Duke sent for consultation regarding a right middle lobe mass.  Danny Duke is a 82 year old gentleman with a history of hypertension, hyperlipidemia, chronic back pain, arthritis, and remote tobacco abuse (quit 25 years ago).  His wife thinks he may have emphysema as well.  He was in his usual state of health until recently when he went for an annual physical.  A chest x-ray was done which showed a right middle lobe opacity.  A CT of the chest showed a 3 x 2.9 x 2.8 cm spiculated opacity in the right middle lobe.  There also were some calcified granulomas in the right upper lobe and a noncalcified nodule in the left upper lobe.  On PET CT the nodule was markedly hypermetabolic with an SUV of 20.  There were hypermetabolic right paratracheal, left paratracheal, subcarinal, and bilateral hilar lymph nodes.  There was low-level activity in a small pleural effusion.  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  He underwent endobronchial ultrasound by Dr. Lake Bells on 08/05/2017.  Aspirations of 4R and 7 lymph nodes were negative for tumor.  He says his appetite has been fair.  He is lost about 5 pounds over the past 3 months.  He has not had any cough or hemoptysis.  He has not noted any unusual shortness of breath or wheezing.  He denies chest pain, pressure, or tightness at rest or with activity.  He has not had any unusual headaches or visual changes.  Zubrod Score: At the time of surgery this patient's most appropriate activity status/level should be described as: [x]     0    Normal activity, no symptoms []     1    Restricted in physical strenuous activity but ambulatory, able to do out light work []     2    Ambulatory and capable of self care, unable to do work activities,  up and about >50 % of waking hours                              []     3    Only limited self care, in bed greater than 50% of waking hours []     4    Completely disabled, no self care, confined to bed or chair []     5    Moribund  Past Medical History:  Diagnosis Date  . Chronic back pain   . Hypercholesterolemia   . Hypertension     Past Surgical History:  Procedure Laterality Date  . BRONCHIAL NEEDLE ASPIRATION BIOPSY  08/05/2017   Procedure: BRONCHIAL NEEDLE ASPIRATION BIOPSIES;  Surgeon: Juanito Doom, MD;  Location: [facility];  Service: Cardiopulmonary;;  . ENDOBRONCHIAL ULTRASOUND Bilateral 08/05/2017   Procedure: ENDOBRONCHIAL ULTRASOUND;  Surgeon: Juanito Doom, MD;  Location: [facility];  Service: Cardiopulmonary;  Laterality: Bilateral;    Family History  Problem Relation Age of Onset  . Colon cancer Brother   . Stomach cancer Brother     Social History Social History   Tobacco Use  . Smoking status: Former Smoker    Packs/day: 0.50    Years: 40.00    Pack years: 20.00    Last attempt to quit: 03/08/1997  Years since quitting: 20.4  . Smokeless tobacco: Never Used  Substance Use Topics  . Alcohol use: No  . Drug use: No    Current Outpatient Medications  Medication Sig Dispense Refill  . amLODipine (NORVASC) 10 MG tablet Take 10 mg by mouth daily.    Marland Kitchen aspirin EC 81 MG tablet Take 81 mg by mouth daily.    Marland Kitchen lisinopril (PRINIVIL,ZESTRIL) 40 MG tablet Take 40 mg by mouth daily.    . meclizine (ANTIVERT) 12.5 MG tablet Take 1 tablet (12.5 mg total) by mouth 3 (three) times daily as needed for dizziness. 30 tablet 0  . oxycodone (ROXICODONE) 30 MG immediate release tablet Take 20 mg by mouth 4 (four) times daily.     . simvastatin (ZOCOR) 20 MG tablet Take 20 mg by mouth daily.     No current facility-administered medications for this visit.     No Known Allergies  Review of Systems  Constitutional: Positive for unexpected weight change  (Lost 5 pounds in 3 months). Negative for activity change, appetite change, chills, diaphoresis and fever.  HENT: Negative for trouble swallowing and voice change.   Eyes: Negative for visual disturbance.  Respiratory: Negative for chest tightness, shortness of breath and wheezing.   Cardiovascular: Negative for chest pain and leg swelling.  Gastrointestinal: Negative for abdominal pain and anal bleeding.  Genitourinary: Negative for difficulty urinating and dysuria.  Musculoskeletal: Positive for arthralgias and back pain.  Neurological: Negative for dizziness, seizures and syncope.  Hematological: Negative for adenopathy. Does not bruise/bleed easily.    BP 137/84 (BP Location: Right Arm, Patient Position: Sitting, Cuff Size: Normal)   Pulse 81   Resp 18   Ht 5\' 8"  (1.727 m)   Wt 161 lb 6.4 oz (73.2 kg)   SpO2 98% Comment: RA  BMI 24.54 kg/m  Physical Exam  Constitutional: He is oriented to person, place, and time. He appears well-developed and well-nourished. No distress.  HENT:  Head: Normocephalic and atraumatic.  Mouth/Throat: No oropharyngeal exudate.  Eyes: Pupils are equal, round, and reactive to light. Conjunctivae and EOM are normal. No scleral icterus.  Neck: Neck supple. No thyromegaly present.  Cardiovascular: Normal rate, regular rhythm and normal heart sounds. Exam reveals no gallop and no friction rub.  No murmur heard. Pulmonary/Chest: Effort normal and breath sounds normal. No stridor. No respiratory distress. He has no wheezes.  Abdominal: Soft. He exhibits no distension. There is no tenderness.  Musculoskeletal: He exhibits no edema.  Lymphadenopathy:    He has no cervical adenopathy.  Neurological: He is alert and oriented to person, place, and time. No cranial nerve deficit. He exhibits normal muscle tone. Coordination normal.  Skin: Skin is warm and dry.  Vitals reviewed.    Diagnostic Tests: CT CHEST WITH CONTRAST  TECHNIQUE: Multidetector CT  imaging of the chest was performed during intravenous contrast administration.  Creatinine was obtained on site at [facility] at [address]  Results: Creatinine 1.4 mg/dL.  BUN 21.  GFR 55.  CONTRAST:  80mL ISOVUE-300 IOPAMIDOL (ISOVUE-300) INJECTION 61%  COMPARISON:  None.  FINDINGS: Cardiovascular: Heart is normal in size. No pericardial effusion. Three-vessel coronary artery calcifications, most evident on the left. Great vessels are normal in caliber. No aortic dissection or atherosclerosis.  Mediastinum/Nodes: No neck base or axillary masses or pathologically enlarged nodes are subcentimeter thyroid nodules.  Prominent mildly enlarged mediastinal and hilar lymph nodes, many with associated calcifications. Right paratracheal node measures 13 mm short axis. Subcarinal  node measures 19 mm short axis. Right superior hilar node measures 2.8 x 1.6 cm.  Trachea is patent. Esophagus is mildly distended but otherwise unremarkable.  Lungs/Pleura: Trace right pleural effusion.  In the right middle lobe, there is a masslike opacity with spiculated margins. It measures 3.0 x 2.9 x 2.8 cm. There is contiguous opacity extends to the anterior N inferior pleural margins with intervening cystic change. Mass causes retraction of the oblique fissure anteriorly.  There is pleuroparenchymal scarring at both apices. The right, irregular nodular opacities lie in the right lobe extending to the area pleuroparenchymal scarring.  There are 2 discrete calcified granuloma the right upper lobe. 4 mm noncalcified nodule in the left upper lobe, image 54, series 5.  Lungs also show significant changes of emphysema with other areas of mild interstitial thickening peripherally. Scarring with a Siemens noted in both lung bases.  No pulmonary edema.  No pneumothorax.  Upper Abdomen: No acute findings. No visualized liver or adrenal masses.  Musculoskeletal: No  fracture or acute finding. No osteoblastic or osteolytic lesions.  IMPRESSION: 1. Right middle lobe mass with spiculated margins. Mass measures 3 cm in greatest dimension, but is also contiguous with additional opacity extending to the anterior inferior aspect of the right middle lobe. Neoplasm is suspected. Tissue sampling is warranted. 2. Irregular nodular opacities in the right upper lobe extending to the apex. This may all be scarring. Cannot exclude any of the small nodular components as neoplastic disease. 3. Bilateral apical pleuroparenchymal scarring 4. Small noncalcified left lobe nodule measuring 4 mm. 5. Advanced emphysema. 6. Mild mediastinal and right hilar adenopathy. Multiple lymph nodes demonstrate calcifications consistent with changes from old, healed granulomatous disease. 7. Coronary artery calcifications.  Emphysema (ICD10-J43.9).   Electronically Signed   By: Lajean Manes M.D.   On: 06/16/2017 17:15 NUCLEAR MEDICINE PET SKULL BASE TO THIGH  TECHNIQUE: 8.2 mCi F-18 FDG was injected intravenously. Full-ring PET imaging was performed from the skull base to thigh after the radiotracer. CT data was obtained and used for attenuation correction and anatomic localization.  Fasting blood glucose: 98 mg/dl  COMPARISON:  Chest CT from 06/16/2017  FINDINGS: Mediastinal blood pool activity: SUV max 2.3  NECK: Substantial misregistration of PET and CT data in the head and neck region, I did pay special attention to the non attenuation corrected data because of this.  No significant abnormal accentuated metabolic activity in the neck.  Incidental CT findings: None  CHEST: Spiculated right middle lobe mass 3.4 by 2.5 cm, previously 3.2 by 2.8 cm by my measurements. This has a maximum SUV of 20.1. An adjacent right middle lobe nodule measuring approximately 1.9 by 1.4 cm on image 48/8 has a maximum SUV of 20.9.  There is hypermetabolic right  paratracheal, prevascular, left paratracheal, bilateral hilar, bilateral infrahilar, and subcarinal adenopathy. Index right paratracheal node measuring 1.6 cm in short axis on image 51/4 has a maximum SUV of 4.6. Right hilar adenopathy with speckled calcifications has a maximum SUV of 8.2.  Small right pleural effusion with trace pleural thickening and very low-level activity with SUV of about 2.0.  Indistinct and irregular biapical densities with some faint calcifications and mild accentuated metabolic activity, maximum SUV 4.5 on the right and 5.3 on the left.  Incidental CT findings: Severe centrilobular emphysema. Coronary, aortic arch, and branch vessel atherosclerotic vascular disease.  ABDOMEN/PELVIS: Indistinctly marginated portacaval lymph node has mildly accentuated metabolic activity with maximum SUV 5.0, and short axis diameter estimated at 1.0  cm.  Incidental CT findings: Nonspecific 0.8 cm hypodense lesion inferiorly in the right hepatic lobe without accentuated metabolic activity. Photopenic fluid density lesion of the left kidney upper pole favoring cyst. Similar low-density lesion of the left kidney lower pole.  Infrarenal abdominal aortic aneurysm with mural thrombus, measuring 3.5 by 3.4 cm on image 120/4.  SKELETON: No hypermetabolic skeletal metastatic lesion is identified.  Incidental CT findings: Thoracic kyphosis. Lower lumbar degenerative facet arthropathy.  IMPRESSION: 1. The right middle lobe mass and adjacent right middle lobe nodule are both highly hypermetabolic on today's examination. In addition, there is biapical scarring which is mildly hypermetabolic, and mildly hypermetabolic scattered lymph nodes in the mediastinum and hila which are partially calcified. Although malignancy is clearly a concern in the right middle lobe, part or all of the appearance could also be caused by active granulomatous disease. The possibility of active  granulomatous disease is somewhat emphasized in this case given that the biapical scarring and calcified lymph nodes would be more characteristic of a prior granulomatous process. That said, the right middle lobe lesions likely warrant sampling as these lesions are substantially more metabolic than the rest of the findings. 2. Small right pleural effusion with trace pleural thickening and low-grade activity. 3. Fusiform infrarenal abdominal aortic aneurysm, 3.5 cm in diameter. Recommend followup by [place] in 2 years. This recommendation follows ACR consensus guidelines: White Paper of the ACR Incidental Findings Committee II on Vascular Findings. J Am Coll Radiol 2013; 10:789-794. 4. Other imaging findings of potential clinical significance: Aortic Atherosclerosis (ICD10-I70.0) and Emphysema (ICD10-J43.9). Coronary atherosclerosis. Hypodense hepatic and left renal lesions are likely cysts. Thoracic kyphosis.   Electronically Signed   By: Van Clines M.D.   On: 07/05/2017 15:40 I personally reviewed the CT and PET/CT images and concur with the findings noted above.  Impression: Mr. Verrette is a 82 year old gentleman with a remote history of tobacco abuse, and a history of hypertension, hyperlipidemia, arthritis, chronic back pain, and Gold class I COPD.  He recently was found to have a right middle lobe nodule on a chest x-ray was done at the time of a routine physical.  CT confirmed a spiculated right middle lobe nodule.  A CT of the chest showed a 3 x 2.9 x 2.8 cm spiculated mass in the right middle lobe.  On PET CT the nodule was interpreted as 2 separate nodules both with an SUV of 20.  It is unclear if there actually are 2 separate nodules such as a primary and a satellite or whether this is one more cavitary nodule.  On CT it appears more like a single mass with some central cavitation.  CT-guided biopsy of the nodule showed squamous cell carcinoma.  Endobronchial ultrasound  with lymph node aspiration showed no evidence of involvement of the lymph nodes.  I had a long discussion with Mr. and Danny Duke.  I reviewed the films with them.  We discussed that although clinical staging would suggest T2, N3, stage IIIb disease, pathologic staging at this point suggest T2, N0, stage Ib disease.  I also discussed the potential for a false negative biopsy with endobronchial ultrasound due to the limited sample size.  I do think he would need a formal mediastinoscopy before proceeding with resection.  He could be done at the same setting.  I recommended to them that we proceed with mediastinoscopy, right VATS for right middle lobectomy.  We would only proceed with the resection if the mediastinoscopy was truly negative.  I described the general nature of the procedure including the incisions to be used, the need for general anesthesia, the use of a drainage tube postoperatively, the expected hospital stay, and the overall recovery.  I informed him of the indications, risks, benefits, and alternatives.  They understand the risks include, but not limited to death, MI, stroke, DVT, PE, bleeding, possible need for transfusion, infection, recurrent nerve injury, prolonged air leak, cardiac arrhythmias, as well as possibility of other procedural complications.  Danny Duke is much more interested in pursuing surgery than Danny Duke is.  He says that he just does not want to be cut on.  He wanted to know what the alternative treatments were.  I briefly discussed radiation with or without chemotherapy as a treatment option.  He wishes to speak with radiation oncology medical oncology before making a decision as to how to proceed.  I will arrange for him to be seen in our multidisciplinary thoracic oncology clinic next week.  Plan: [place] next week to see radiation oncology and medical oncology.  I will follow-up with him after he has had a chance to talk with them.  We can further discuss  surgery as an option at that time.  [name], MD [facility] [phone]

## 2017-08-22 ENCOUNTER — Telehealth: Payer: Self-pay | Admitting: *Deleted

## 2017-08-22 DIAGNOSIS — C3491 Malignant neoplasm of unspecified part of right bronchus or lung: Secondary | ICD-10-CM

## 2017-08-22 NOTE — Telephone Encounter (Signed)
Oncology Nurse Navigator Documentation  Oncology Nurse Navigator Flowsheets 08/22/2017  Navigator Location [facility]  Referral date to RadOnc/MedOnc 08/22/2017  Navigator Encounter Type Telephone/I called patient. Updated on appt for [facility] [place] with week.  Telephone Outgoing Call  Treatment Phase Pre-Tx/Tx Discussion  Barriers/Navigation Needs Education;Coordination of Care  Education Other  Interventions Coordination of Care;Education  Coordination of Care Appts  Education Method Verbal  Acuity Level 2  Acuity Level 2 Other  Time Spent with Patient 30

## 2017-08-25 ENCOUNTER — Ambulatory Visit
Admission: RE | Admit: 2017-08-25 | Discharge: 2017-08-25 | Disposition: A | Discharge status: Home / Self Care | Payer: Medicare Other | Source: Ambulatory Visit | Attending: Radiation Oncology | Admitting: Radiation Oncology

## 2017-08-25 ENCOUNTER — Encounter: Payer: Self-pay | Admitting: *Deleted

## 2017-08-25 ENCOUNTER — Inpatient Hospital Stay (HOSPITAL_BASED_OUTPATIENT_CLINIC_OR_DEPARTMENT_OTHER): Payer: Medicare Other | Admitting: Internal Medicine

## 2017-08-25 ENCOUNTER — Other Ambulatory Visit: Payer: Self-pay | Admitting: *Deleted

## 2017-08-25 ENCOUNTER — Inpatient Hospital Stay: Payer: Medicare Other | Attending: Internal Medicine

## 2017-08-25 ENCOUNTER — Encounter: Payer: Self-pay | Admitting: Internal Medicine

## 2017-08-25 VITALS — BP 128/79 | HR 84 | Temp 98.9°F | Resp 18 | Ht 68.0 in | Wt 160.8 lb

## 2017-08-25 DIAGNOSIS — C3491 Malignant neoplasm of unspecified part of right bronchus or lung: Secondary | ICD-10-CM

## 2017-08-25 DIAGNOSIS — Z7982 Long term (current) use of aspirin: Secondary | ICD-10-CM | POA: Insufficient documentation

## 2017-08-25 DIAGNOSIS — I10 Essential (primary) hypertension: Secondary | ICD-10-CM | POA: Diagnosis not present

## 2017-08-25 DIAGNOSIS — C778 Secondary and unspecified malignant neoplasm of lymph nodes of multiple regions: Secondary | ICD-10-CM | POA: Insufficient documentation

## 2017-08-25 DIAGNOSIS — C342 Malignant neoplasm of middle lobe, bronchus or lung: Secondary | ICD-10-CM | POA: Diagnosis present

## 2017-08-25 DIAGNOSIS — M549 Dorsalgia, unspecified: Secondary | ICD-10-CM

## 2017-08-25 DIAGNOSIS — Z79899 Other long term (current) drug therapy: Secondary | ICD-10-CM | POA: Diagnosis not present

## 2017-08-25 DIAGNOSIS — G8929 Other chronic pain: Secondary | ICD-10-CM | POA: Diagnosis not present

## 2017-08-25 DIAGNOSIS — Z800 Family history of malignant neoplasm of digestive organs: Secondary | ICD-10-CM | POA: Insufficient documentation

## 2017-08-25 DIAGNOSIS — Z87891 Personal history of nicotine dependence: Secondary | ICD-10-CM | POA: Diagnosis not present

## 2017-08-25 DIAGNOSIS — C3490 Malignant neoplasm of unspecified part of unspecified bronchus or lung: Secondary | ICD-10-CM

## 2017-08-25 DIAGNOSIS — Z803 Family history of malignant neoplasm of breast: Secondary | ICD-10-CM | POA: Diagnosis not present

## 2017-08-25 LAB — CBC WITH DIFFERENTIAL (CANCER CENTER ONLY)
BASOS ABS: 0 10*3/uL (ref 0.0–0.1)
BASOS PCT: 0 %
Eosinophils Absolute: 0.2 10*3/uL (ref 0.0–0.5)
Eosinophils Relative: 3 %
HEMATOCRIT: 40.6 % (ref 38.4–49.9)
Hemoglobin: 13.3 g/dL (ref 13.0–17.1)
Lymphocytes Relative: 35 %
Lymphs Abs: 2.2 10*3/uL (ref 0.9–3.3)
MCH: 31.5 pg (ref 27.2–33.4)
MCHC: 32.8 g/dL (ref 32.0–36.0)
MCV: 96.2 fL (ref 79.3–98.0)
MONO ABS: 0.7 10*3/uL (ref 0.1–0.9)
Monocytes Relative: 11 %
NEUTROS ABS: 3.3 10*3/uL (ref 1.5–6.5)
Neutrophils Relative %: 51 %
PLATELETS: 144 10*3/uL (ref 140–400)
RBC: 4.22 MIL/uL (ref 4.20–5.82)
RDW: 13.4 % (ref 11.0–14.6)
WBC Count: 6.4 10*3/uL (ref 4.0–10.3)

## 2017-08-25 LAB — CMP (CANCER CENTER ONLY)
ALBUMIN: 3.4 g/dL — AB (ref 3.5–5.0)
ALT: <6 U/L (ref 0–55)
AST: 15 U/L (ref 5–34)
Alkaline Phosphatase: 73 U/L (ref 40–150)
Anion gap: 6 (ref 3–11)
BILIRUBIN TOTAL: 0.3 mg/dL (ref 0.2–1.2)
BUN: 12 mg/dL (ref 7–26)
CHLORIDE: 109 mmol/L (ref 98–109)
CO2: 26 mmol/L (ref 22–29)
Calcium: 9.6 mg/dL (ref 8.4–10.4)
Creatinine: 1.47 mg/dL — ABNORMAL HIGH (ref 0.70–1.30)
GFR, Est AFR Am: 51 mL/min — ABNORMAL LOW (ref >60)
GFR, Estimated: 44 mL/min — ABNORMAL LOW (ref >60)
GLUCOSE: 104 mg/dL (ref 70–140)
POTASSIUM: 4.2 mmol/L (ref 3.5–5.1)
Sodium: 141 mmol/L (ref 136–145)
Total Protein: 7.6 g/dL (ref 6.4–8.3)

## 2017-08-25 NOTE — Progress Notes (Signed)
   [facility] Clinical Social Work  Clinical Social Work met with patient/family to offer support and assess for psychosocial needs.  Patient was accompanied by his spouse.  Patient shared they had recently relocated to [place] from [place], [place].  He initially shared he was not sure how he was processing information, but throughout discussion patient reported he would like to proceed with surgery option.   CSW and patient/family explored common reactions to cancer diagnosis and how to manage opinions/expectations of others when sharing diagnosis information.  Clinical Social Work briefly discussed Clinical Social Work role and [employer] support programs/services.  Clinical Social Work encouraged patient to call with any additional questions or concerns.   Maryjean Morn, MSW, LCSW, OSW-C Clinical Social Worker [facility] [phone]

## 2017-08-25 NOTE — Progress Notes (Signed)
Radiation Oncology         ([phone]) [phone] ________________________________ Multidisciplinary Thoracic Oncology Clinic [facility]) Initial Outpatient Consultation  Name: Danny Duke MRN: [id number]  Date of Service: 08/25/2017 DOB: 09-Jul-1935  XH:[id number], [place], [place]  [name], *   REFERRING PHYSICIAN: [name], *  DIAGNOSIS: The encounter diagnosis was Squamous cell carcinoma lung, right ([facility]).    ICD-10-CM   1. Squamous cell carcinoma lung, right ([facility]) C34.91     HISTORY OF PRESENT ILLNESS: Danny Duke is a 82 y.o. male seen at the request of Dr. Roxan Hockey. He has a PMH significant for hypertension, hyperlipidemia, chronic back pain, and arthritis.  At the time of his recent routine annual physical, chest x-ray showed a right middle lobe opacity.  CT scan of the chest was performed on 06/16/2017 for further evaluation and this confirmed a 3 x 2.9 x 2.8 cm spiculated opacity in the right middle lobe as well as some calcified granulomas in the right upper lobe and a noncalcified nodule in the left upper lobe.  This was further evaluated with a PET CT which was performed on 07/05/2017 and demonstrated a slight interval increase in size of the right middle lobe mass measuring 3.4 x 2.5 cm, previously 3.2 x 2.8 cm on CT as well as an adjacent middle lobe nodule measuring 1.9 x 1.4 cm, both hypermetabolic with SUV of 20.  Additionally, there were multiple mildly hypermetabolic, partially calcified lymph nodes in the mediastinum and hila, suspicious for active granulomatous disease and low-grade activity in a small right pleural effusion.         He underwent a CT-guided core needle biopsy of the right middle lobe mass on 07/29/2017 with final pathology confirming moderately differentiated squamous cell carcinoma.  He underwent a bronch/EBUS with FNA of the mediastinal lymph nodes with Dr. Lake Bells on 08/05/2017 for disease staging.  Aspirations of 4R and 7 lymph nodes were  negative for tumor.  He met with Dr. Roxan Hockey on 08/18/17 to discuss treatment options.  Dr. Roxan Hockey recommended for patient to have mediastinoscopy with a right VATS for right middle lobectomy if the mediastinoscopy was truly negative. The patient is not interested in surgery and was therefore kindly referred today for presentation in the multidisciplinary thoracic oncology conference. Radiology studies and pathology slides were presented there for review and discussion of treatment options. A consensus was discussed regarding potential next steps.  PREVIOUS RADIATION THERAPY: No  PAST MEDICAL HISTORY:  Past Medical History:  Diagnosis Date  . Chronic back pain   . Hypercholesterolemia   . Hypertension       PAST SURGICAL HISTORY: Past Surgical History:  Procedure Laterality Date  . BRONCHIAL NEEDLE ASPIRATION BIOPSY  08/05/2017   Procedure: BRONCHIAL NEEDLE ASPIRATION BIOPSIES;  Surgeon: Juanito Doom, MD;  Location: [facility];  Service: Cardiopulmonary;;  . ENDOBRONCHIAL ULTRASOUND Bilateral 08/05/2017   Procedure: ENDOBRONCHIAL ULTRASOUND;  Surgeon: Juanito Doom, MD;  Location: [facility];  Service: Cardiopulmonary;  Laterality: Bilateral;    FAMILY HISTORY:  Family History  Problem Relation Age of Onset  . Colon cancer Brother   . Stomach cancer Brother     SOCIAL HISTORY:  Social History   Socioeconomic History  . Marital status: Married    Spouse name: Not on file  . Number of children: Not on file  . Years of education: Not on file  . Highest education level: Not on file  Occupational History  . Not on file  Social  Needs  . Financial resource strain: Not on file  . Food insecurity:    Worry: Not on file    Inability: Not on file  . Transportation needs:    Medical: Not on file    Non-medical: Not on file  Tobacco Use  . Smoking status: Former Smoker    Packs/day: 0.50    Years: 40.00    Pack years: 20.00    Last attempt to quit:  03/08/1997    Years since quitting: 20.4  . Smokeless tobacco: Never Used  Substance and Sexual Activity  . Alcohol use: No  . Drug use: No  . Sexual activity: Not on file  Lifestyle  . Physical activity:    Days per week: Not on file    Minutes per session: Not on file  . Stress: Not on file  Relationships  . Social connections:    Talks on phone: Not on file    Gets together: Not on file    Attends religious service: Not on file    Active member of club or organization: Not on file    Attends meetings of clubs or organizations: Not on file    Relationship status: Not on file  . Intimate partner violence:    Fear of current or ex partner: Not on file    Emotionally abused: Not on file    Physically abused: Not on file    Forced sexual activity: Not on file  Other Topics Concern  . Not on file  Social History Narrative  . Not on file    ALLERGIES: Patient has no known allergies.  MEDICATIONS:  Current Outpatient Medications  Medication Sig Dispense Refill  . amLODipine (NORVASC) 10 MG tablet Take 10 mg by mouth daily.    Marland Kitchen aspirin EC 81 MG tablet Take 81 mg by mouth daily.    Marland Kitchen lisinopril (PRINIVIL,ZESTRIL) 40 MG tablet Take 40 mg by mouth daily.    . meclizine (ANTIVERT) 12.5 MG tablet Take 1 tablet (12.5 mg total) by mouth 3 (three) times daily as needed for dizziness. 30 tablet 0  . oxycodone (ROXICODONE) 30 MG immediate release tablet Take 20 mg by mouth 4 (four) times daily.     . simvastatin (ZOCOR) 20 MG tablet Take 20 mg by mouth daily.    . tadalafil (CIALIS) 5 MG tablet Take 5 mg by mouth daily.  12   No current facility-administered medications for this encounter.     REVIEW OF SYSTEMS:  On review of systems, the patient reports that he is doing well overall.  He denies any chest pain, shortness of breath, cough, fevers, chills, night sweats, unintended weight changes.  He denies any bowel or bladder disturbances, and denies abdominal pain, nausea or vomiting.   He denies any new musculoskeletal or joint aches or pains. A complete review of systems is obtained and is otherwise negative.  PHYSICAL EXAM:  Wt Readings from Last 3 Encounters:  08/25/17 160 lb 12.8 oz (72.9 kg)  08/18/17 161 lb 6.4 oz (73.2 kg)  07/29/17 164 lb (74.4 kg)   Temp Readings from Last 3 Encounters:  08/25/17 98.9 F (37.2 C) (Oral)  08/05/17 97.8 F (36.6 C) (Oral)  07/29/17 98 F (36.7 C) (Oral)   BP Readings from Last 3 Encounters:  08/25/17 128/79  08/18/17 137/84  08/05/17 (!) 137/97   Pulse Readings from Last 3 Encounters:  08/25/17 84  08/18/17 81  08/05/17 95    /10  In general this is  a well appearing African-American male in no acute distress.  He is alert and oriented x4 and appropriate throughout the examination. HEENT reveals that the patient is normocephalic, atraumatic. EOMs are intact. PERRLA. Skin is intact without any evidence of gross lesions. Cardiovascular exam reveals a regular rate and rhythm, no clicks rubs or murmurs are auscultated. Chest is clear to auscultation bilaterally. Lymphatic assessment is performed and does not reveal any adenopathy in the cervical, supraclavicular, axillary, or inguinal chains. Abdomen has active bowel sounds in all quadrants and is intact. The abdomen is soft, non tender, non distended. Lower extremities are negative for pretibial pitting edema, deep calf tenderness, cyanosis or clubbing.  KPS = 100  100 - Normal; no complaints; no evidence of disease. 90   - Able to carry on normal activity; minor signs or symptoms of disease. 80   - Normal activity with effort; some signs or symptoms of disease. 45   - Cares for self; unable to carry on normal activity or to do active work. 60   - Requires occasional assistance, but is able to care for most of his personal needs. 50   - Requires considerable assistance and frequent medical care. 2   - Disabled; requires special care and assistance. 55   - Severely  disabled; hospital admission is indicated although death not imminent. 3   - Very sick; hospital admission necessary; active supportive treatment necessary. 10   - Moribund; fatal processes progressing rapidly. 0     - Dead  Karnofsky DA, Abelmann [place], Craver LS and Gilgo JH [phone]) The use of the nitrogen mustards in the palliative treatment of carcinoma: with particular reference to bronchogenic carcinoma Cancer 1 [id number]  LABORATORY DATA:  Lab Results  Component Value Date   WBC 6.4 08/25/2017   HGB 13.3 08/25/2017   HCT 40.6 08/25/2017   MCV 96.2 08/25/2017   PLT 144 08/25/2017   Lab Results  Component Value Date   NA 141 08/25/2017   K 4.2 08/25/2017   CL 109 08/25/2017   CO2 26 08/25/2017   Lab Results  Component Value Date   ALT <6 08/25/2017   AST 15 08/25/2017   ALKPHOS 73 08/25/2017   BILITOT 0.3 08/25/2017     RADIOGRAPHY: Ct Biopsy  Result Date: 07/29/2017 INDICATION: 82 year old male with a history of hypermetabolic right middle lobe cavitary mass concerning for primary bronchogenic carcinoma. EXAM: CT-guided biopsy right middle lobe pulmonary nodule Interventional Radiologist:  Criselda Peaches, MD MEDICATIONS: None. ANESTHESIA/SEDATION: Fentanyl 50 mcg IV; Versed 1.5 mg IV Moderate Sedation Time: 13 minutes The patient was continuously monitored during the procedure by the interventional radiology nurse under my direct supervision. FLUOROSCOPY TIME:  Fluoroscopy Time: 0 minutes 0 seconds (0 mGy). COMPLICATIONS: None immediate. Estimated blood loss:  0 PROCEDURE: Informed written consent was obtained from the patient after a thorough discussion of the procedural risks, benefits and alternatives. All questions were addressed. Maximal Sterile Barrier Technique was utilized including caps, mask, sterile gowns, sterile gloves, sterile drape, hand hygiene and skin antiseptic. A timeout was performed prior to the initiation of the procedure. A planning axial CT scan was  performed. The nodule in the right middle lobe was successfully identified. A suitable skin entry site was selected and marked. The region was then sterilely prepped and draped in standard fashion using Betadine skin prep. Local anesthesia was attained by infiltration with 1% lidocaine. A small dermatotomy was made. Under intermittent CT fluoroscopic guidance, a 17 gauge trocar needle was  advanced into the lung and positioned at the margin of the nodule. Multiple 18 gauge core biopsies were then coaxially obtained using the BioPince automated biopsy device. Biopsy specimens were placed in formalin and delivered to pathology for further analysis. A bio sentry device was then deployed per the usual protocol. Post biopsy axial CT imaging demonstrates no evidence of immediate complication. There is no pneumothorax. Mild perilesional alveolar hemorrhage is not unexpected. The patient tolerated the procedure well. IMPRESSION: Technically successful CT-guided biopsy right middle lobe pulmonary mass. Electronically Signed   By: Jacqulynn Cadet M.D.   On: 07/29/2017 13:15   Dg Chest Port 1 View  Result Date: 07/29/2017 CLINICAL DATA:  Right middle lobe biopsy today EXAM: PORTABLE CHEST 1 VIEW COMPARISON:  CT 07/29/2017 FINDINGS: Right middle lobe mass lesion.  No pneumothorax post biopsy COPD. Apical pleural and parenchymal scarring bilaterally. Small right pleural effusion IMPRESSION: Negative for pneumothorax post right middle lobe biopsy. Electronically Signed   By: Franchot Gallo M.D.   On: 07/29/2017 13:46      IMPRESSION/PLAN: 1. 82 y.o. with recently diagnosed pT2N0 NSCLC, squamous cell carcinoma of the right middle lobe. Today, we talked to the patient and family about the findings and workup thus far. We discussed the natural history of non-small cell carcinoma of the lung and general treatment, highlighting the role of radiotherapy in the management. We discussed the available radiation techniques, and  focused on the details of logistics and delivery. The consensus recommendation from multidisciplinary lung conference today is to proceed with mediastinoscopy with a right VATS for right middle lobectomy if the mediastinoscopy is truly negative.  If mediastinoscopy shows lymph node involvement, he would be referred back to consider concurrent chemoradiation over the course of 6.5 weeks of daily radiation treatments.  If the mediastinoscopy is negative but the patient is unable to undergo VATS resection, we would offer gated SBRT to the RML lesion(s) for treatment of his disease.  We discussed the need to have fiducial markers placed prior to CT Simulation in preparation for radiotherapy.  We reviewed the anticipated acute and late sequelae associated with radiation in this setting. The patient was encouraged to ask questions that were answered to his satisfaction.  At the conclusion of our conversation, the patient elects to proceed with surgical intervention as recommended.  He will meet back with Dr. Roxan Hockey again today to move forward with scheduling this procedure in the near future.  We enjoyed meeting him and his wife today and would be happy to participate in his care if clinically indicated in the future.  More than 50% of today's visit was spent in counseling and/or coordination of care.   Nicholos Johns, PA-C    Tyler Pita, MD  [facility] Direct Dial: [phone]  Fax: [phone] [email or web address]  Skype  LinkedIn

## 2017-08-25 NOTE — Progress Notes (Signed)
[place] [phone]   [phone] Multidisciplinary thoracic oncology clinic  CONSULT NOTE  REFERRING PHYSICIAN: Dr. Simonne Maffucci  REASON FOR CONSULTATION:  82 years old African-American male recently diagnosed with lung cancer.  HPI Danny Duke is a 82 y.o. male with past medical history significant for chronic back pain, hypertension and dyslipidemia as well as long history for smoking.  The patient was seen by his primary care provider for routine physical examination and chest x-ray was performed in March 2019 and showed abnormality in the right lung.  This was followed by CT scan of the chest on 06/16/2017 and it showed a masslike opacity with spiculated margins located in the right middle lobe and measuring 3.0 x 2.9 x 2.8 cm.  There was also a prominent mildly enlarged mediastinal and hilar lymph nodes many with associated calcification.  The right paratracheal node measured 1.3 cm in short axis, subcarinal node measured 1.9 cm, right superior hilar node measured 2.8 x 1.6 cm.  A PET scan was performed on 07/05/2017 and that showed the right middle lobe mass and adjacent right middle lobe nodule or post highly hypermetabolic.  There was also mildly hypermetabolic scattered lymph nodes in the mediastinum and hila which are partially calcified concerning for malignancy versus active granulomatous disease.  On Jul 29, 2017 the patient had CT-guided core biopsy of the right middle lobe lung mass by interventional radiology and the final pathology ([id number]) showed moderately differentiated squamous cell carcinoma.  The patient was seen by Dr. Lake Bells and he underwent bronchoscopy with endobronchial ultrasound and biopsy of level 7 and 4R lymph nodes were negative for malignancy. The patient was also seen by Dr. Roxan Hockey for consideration of mediastinoscopy and surgical resection but he was reluctant about the surgical approach and he would like to  be seen at the multidisciplinary thoracic oncology clinic first before making a decision. When seen today the patient is feeling fine with no specific complaints except for mild dry cough.  He denied having any chest pain, shortness of breath or hemoptysis.  He lost around 5 pounds in the last few weeks.  He denied having any nausea, vomiting, diarrhea or constipation.  He denied having any headache or visual changes. Family history significant for brother with colon cancer, brother with stomach cancer, sister with breast cancer, father died from complication of alcoholism and mother died from old age. The patient is married and has 8 children.  He was accompanied today by his wife, [name]. 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  He has a history of smoking 1 pack/day for around 40 years and quit 25 years ago.  He just recently moved from [place] to [place] 2 years ago.  He has history of alcohol abuse in the past but not recently and no history of drug abuse.  HPI  Past Medical History:  Diagnosis Date  . Chronic back pain   . Hypercholesterolemia   . Hypertension     Past Surgical History:  Procedure Laterality Date  . BRONCHIAL NEEDLE ASPIRATION BIOPSY  08/05/2017   Procedure: BRONCHIAL NEEDLE ASPIRATION BIOPSIES;  Surgeon: Juanito Doom, MD;  Location: [facility];  Service: Cardiopulmonary;;  . ENDOBRONCHIAL ULTRASOUND Bilateral 08/05/2017   Procedure: ENDOBRONCHIAL ULTRASOUND;  Surgeon: Juanito Doom, MD;  Location: [facility];  Service: Cardiopulmonary;  Laterality: Bilateral;    Family History  Problem Relation Age of Onset  . Colon cancer Brother   . Stomach  cancer Brother     Social History Social History   Tobacco Use  . Smoking status: Former Smoker    Packs/day: 0.50    Years: 40.00    Pack years: 20.00    Last attempt to quit: 03/08/1997    Years since quitting: 20.4  . Smokeless tobacco: Never Used  Substance Use Topics  . Alcohol  use: No  . Drug use: No    No Known Allergies  Current Outpatient Medications  Medication Sig Dispense Refill  . amLODipine (NORVASC) 10 MG tablet Take 10 mg by mouth daily.    Marland Kitchen aspirin EC 81 MG tablet Take 81 mg by mouth daily.    Marland Kitchen lisinopril (PRINIVIL,ZESTRIL) 40 MG tablet Take 40 mg by mouth daily.    . meclizine (ANTIVERT) 12.5 MG tablet Take 1 tablet (12.5 mg total) by mouth 3 (three) times daily as needed for dizziness. 30 tablet 0  . oxycodone (ROXICODONE) 30 MG immediate release tablet Take 20 mg by mouth 4 (four) times daily.     . simvastatin (ZOCOR) 20 MG tablet Take 20 mg by mouth daily.     No current facility-administered medications for this visit.     Review of Systems  Constitutional: positive for weight loss Eyes: negative Ears, nose, mouth, throat, and face: negative Respiratory: positive for cough Cardiovascular: negative Gastrointestinal: negative Genitourinary:negative Integument/breast: negative Hematologic/lymphatic: negative Musculoskeletal:negative Neurological: negative Behavioral/Psych: negative Endocrine: negative Allergic/Immunologic: negative  Physical Exam  QIO:NGEXB, healthy, no distress, well nourished and well developed SKIN: skin color, texture, turgor are normal, no rashes or significant lesions HEAD: Normocephalic, No masses, lesions, tenderness or abnormalities EYES: normal, PERRLA, Conjunctiva are pink and non-injected EARS: External ears normal, Canals clear OROPHARYNX:no exudate, no erythema and lips, buccal mucosa, and tongue normal  NECK: supple, no adenopathy, no JVD LYMPH:  no palpable lymphadenopathy, no hepatosplenomegaly LUNGS: clear to auscultation , and palpation HEART: regular rate & rhythm, no murmurs and no gallops ABDOMEN:abdomen soft, non-tender, normal bowel sounds and no masses or organomegaly BACK: Back symmetric, no curvature., No CVA tenderness EXTREMITIES:no joint deformities, effusion, or inflammation,  no edema  NEURO: alert & oriented x 3 with fluent speech, no focal motor/sensory deficits  PERFORMANCE STATUS: ECOG 1  LABORATORY DATA: Lab Results  Component Value Date   WBC 7.1 06-Aug-2017   HGB 14.8 2017/08/06   HCT 45.2 08-06-2017   MCV 95.2 08/06/2017   PLT 130 (L) 08/06/17      Chemistry   No results found for: NA, K, CL, CO2, BUN, CREATININE, GLU No results found for: CALCIUM, ALKPHOS, AST, ALT, BILITOT     RADIOGRAPHIC STUDIES: Ct Biopsy  Result Date: 2017/08/06 INDICATION: 82 year old male with a history of hypermetabolic right middle lobe cavitary mass concerning for primary bronchogenic carcinoma. EXAM: CT-guided biopsy right middle lobe pulmonary nodule Interventional Radiologist:  Criselda Peaches, MD MEDICATIONS: None. ANESTHESIA/SEDATION: Fentanyl 50 mcg IV; Versed 1.5 mg IV Moderate Sedation Time: 13 minutes The patient was continuously monitored during the procedure by the interventional radiology nurse under my direct supervision. FLUOROSCOPY TIME:  Fluoroscopy Time: 0 minutes 0 seconds (0 mGy). COMPLICATIONS: None immediate. Estimated blood loss:  0 PROCEDURE: Informed written consent was obtained from the patient after a thorough discussion of the procedural risks, benefits and alternatives. All questions were addressed. Maximal Sterile Barrier Technique was utilized including caps, mask, sterile gowns, sterile gloves, sterile drape, hand hygiene and skin antiseptic. A timeout was performed prior to the initiation of the procedure. A planning  axial CT scan was performed. The nodule in the right middle lobe was successfully identified. A suitable skin entry site was selected and marked. The region was then sterilely prepped and draped in standard fashion using Betadine skin prep. Local anesthesia was attained by infiltration with 1% lidocaine. A small dermatotomy was made. Under intermittent CT fluoroscopic guidance, a 17 gauge trocar needle was advanced into the lung  and positioned at the margin of the nodule. Multiple 18 gauge core biopsies were then coaxially obtained using the BioPince automated biopsy device. Biopsy specimens were placed in formalin and delivered to pathology for further analysis. A bio sentry device was then deployed per the usual protocol. Post biopsy axial CT imaging demonstrates no evidence of immediate complication. There is no pneumothorax. Mild perilesional alveolar hemorrhage is not unexpected. The patient tolerated the procedure well. IMPRESSION: Technically successful CT-guided biopsy right middle lobe pulmonary mass. Electronically Signed   By: Jacqulynn Cadet M.D.   On: 07/29/2017 13:15   Dg Chest Port 1 View  Result Date: 07/29/2017 CLINICAL DATA:  Right middle lobe biopsy today EXAM: PORTABLE CHEST 1 VIEW COMPARISON:  CT 07/29/2017 FINDINGS: Right middle lobe mass lesion.  No pneumothorax post biopsy COPD. Apical pleural and parenchymal scarring bilaterally. Small right pleural effusion IMPRESSION: Negative for pneumothorax post right middle lobe biopsy. Electronically Signed   By: Franchot Gallo M.D.   On: 07/29/2017 13:46    ASSESSMENT: This is a very pleasant 82 years old African-American male with recently diagnosed non-small cell lung cancer, squamous cell carcinoma suspicious for a stage Ib/IIIb depending on the status of the mediastinal lymph nodes diagnosed in May 2019.  Presented with right middle lobe lung mass as well as adjacent nodule in addition to suspicious hypermetabolic mediastinal lymph nodes.   PLAN: I had a lengthy discussion with the patient and his wife about his current disease stage, prognosis and treatment options.  I personally and independently reviewed the scan images and discussed the result and showed the images to the patient and his wife. I strongly recommended for the patient to proceed with the recommendation of Dr. Roxan Hockey regarding mediastinoscopy followed by surgical resection of the  mediastinal lymph nodes are negative for malignancy. If the mediastinal lymph nodes are positive for malignancy, the patient may benefit from a course of concurrent chemoradiation and this will be discussed with him in more details in the future visit. We will also complete the staging work-up by ordering MRI of the brain to rule out brain metastasis. The patient will also see Dr. Tammi Klippel for evaluation and discussion of the radiotherapy option. He was seen during the multidisciplinary thoracic oncology clinic today by medical oncology, radiation oncology, physical therapist and social worker as well as the thoracic navigator. I will arrange for the patient a follow-up appointment with me after his evaluation by Dr. Roxan Hockey. He was advised to call immediately if he has any concerning symptoms in the interval. The patient voices understanding of current disease status and treatment options and is in agreement with the current care plan.  All questions were answered. The patient knows to call the clinic with any problems, questions or concerns. We can certainly see the patient much sooner if necessary.  Thank you so much for allowing me to participate in the care of Danny Duke. I will continue to follow up the patient with you and assist in his care.  I spent 40 minutes counseling the patient face to face. The total time spent in  the appointment was 60 minutes.  Disclaimer: This note was dictated with voice recognition software. Similar sounding words can inadvertently be transcribed and may not be corrected upon review.   Eilleen Kempf August 25, 2017, 2:28 PM

## 2017-08-26 ENCOUNTER — Other Ambulatory Visit: Payer: Self-pay

## 2017-08-26 DIAGNOSIS — R911 Solitary pulmonary nodule: Secondary | ICD-10-CM

## 2017-08-31 ENCOUNTER — Ambulatory Visit (HOSPITAL_COMMUNITY)
Admission: RE | Admit: 2017-08-31 | Discharge: 2017-08-31 | Disposition: A | Discharge status: Home / Self Care | Payer: Medicare Other | Source: Ambulatory Visit | Attending: Internal Medicine | Admitting: Internal Medicine

## 2017-08-31 DIAGNOSIS — I739 Peripheral vascular disease, unspecified: Secondary | ICD-10-CM | POA: Diagnosis not present

## 2017-08-31 DIAGNOSIS — G319 Degenerative disease of nervous system, unspecified: Secondary | ICD-10-CM | POA: Diagnosis not present

## 2017-08-31 DIAGNOSIS — C3490 Malignant neoplasm of unspecified part of unspecified bronchus or lung: Secondary | ICD-10-CM | POA: Diagnosis present

## 2017-08-31 MED ORDER — GADOBENATE DIMEGLUMINE 529 MG/ML IV SOLN
15.0000 mL | Freq: Once | INTRAVENOUS | Status: AC | PRN
  Administered 2017-08-31: 14 mL via INTRAVENOUS

## 2017-09-06 NOTE — Pre-Procedure Instructions (Signed)
Danny Duke  09/06/2017      [facility] [address], [place] [address] Phone: [phone] Fax: [phone]    Your procedure is scheduled on September 12, 2017.  Report to [facility] Admitting at 830 AM.  Call this number if you have problems the morning of surgery:  [phone]   Remember:  Do not eat or drink after midnight.    Take these medicines the morning of surgery with A SIP OF WATER  Amlodipine (norvasc) Oxycodone meclizine (antivert)-if needed for dizziness  Follow your surgeon's instructions on when to hold/resume aspirin 81 mg.  7 days prior to surgery STOP taking any  Aleve, Naproxen, Ibuprofen, Motrin, Advil, Goody's, BC's, all herbal medications, fish oil, and all vitamins    Do not wear jewelry, make-up or nail polish.  Do not wear lotions, powders, or perfumes, or deodorant.  Do not shave 48 hours prior to surgery.  Men may shave face and neck.  Do not bring valuables to the hospital.  [facility] is not responsible for any belongings or valuables.  Contacts, dentures or bridgework may not be worn into surgery.  Leave your suitcase in the car.  After surgery it may be brought to your room.  For patients admitted to the hospital, discharge time will be determined by your treatment team.  Patients discharged the day of surgery will not be allowed to drive home.    [place]- Preparing For Surgery  Before surgery, you can play an important role. Because skin is not sterile, your skin needs to be as free of germs as possible. You can reduce the number of germs on your skin by washing with CHG (chlorahexidine gluconate) Soap before surgery.  CHG is an antiseptic cleaner which kills germs and bonds with the skin to continue killing germs even after washing.    Oral Hygiene is also important to reduce your risk of infection.  Remember -  BRUSH YOUR TEETH THE MORNING OF SURGERY WITH YOUR REGULAR TOOTHPASTE  Please do not use if you have an allergy to CHG or antibacterial soaps. If your skin becomes reddened/irritated stop using the CHG.  Do not shave (including legs and underarms) for at least 48 hours prior to first CHG shower. It is OK to shave your face.  Please follow these instructions carefully.   1. Shower the NIGHT BEFORE SURGERY and the MORNING OF SURGERY with CHG.   2. If you chose to wash your hair, wash your hair first as usual with your normal shampoo.  3. After you shampoo, rinse your hair and body thoroughly to remove the shampoo.  4. Use CHG as you would any other liquid soap. You can apply CHG directly to the skin and wash gently with a scrungie or a clean washcloth.   5. Apply the CHG Soap to your body ONLY FROM THE NECK DOWN.  Do not use on open wounds or open sores. Avoid contact with your eyes, ears, mouth and genitals (private parts). Wash Face and genitals (private parts)  with your normal soap.  6. Wash thoroughly, paying special attention to the area where your surgery will be performed.  7. Thoroughly rinse your body with warm water from the neck down.  8. DO NOT shower/wash with your normal soap after using and rinsing off the CHG Soap.  9. Pat yourself dry with  a CLEAN TOWEL.  10. Wear CLEAN PAJAMAS to bed the night before surgery, wear comfortable clothes the morning of surgery  11. Place CLEAN SHEETS on your bed the night of your first shower and DO NOT SLEEP WITH PETS.  Day of Surgery:  Do not apply any deodorants/lotions.  Please wear clean clothes to the hospital/surgery center.   Remember to brush your teeth WITH YOUR REGULAR TOOTHPASTE.  Please read over the following fact sheets that you were given. Pain Booklet, Coughing and Deep Breathing, MRSA Information and Surgical Site Infection Prevention

## 2017-09-06 NOTE — Progress Notes (Addendum)
PCP: Precious Gilding, PA  Cardiologist: pt denies  EKG: obtained today  Stress test: pt denies  ECHO: pt denies  Cardiac Cath: pt denies ever  Chest x-ray: will obtain day of surgery per order  Patient is on aspirin 81 mg and reports he took his last dose prior to surgery today 09/07/17

## 2017-09-07 ENCOUNTER — Encounter (HOSPITAL_COMMUNITY)
Admission: RE | Admit: 2017-09-07 | Discharge: 2017-09-07 | Disposition: A | Discharge status: Home / Self Care | Payer: Medicare Other | Source: Ambulatory Visit | Attending: Thoracic Surgery (Cardiothoracic Vascular Surgery) | Admitting: Thoracic Surgery (Cardiothoracic Vascular Surgery)

## 2017-09-07 ENCOUNTER — Other Ambulatory Visit: Payer: Self-pay

## 2017-09-07 ENCOUNTER — Encounter (HOSPITAL_COMMUNITY): Payer: Self-pay

## 2017-09-07 DIAGNOSIS — Z01812 Encounter for preprocedural laboratory examination: Secondary | ICD-10-CM | POA: Diagnosis present

## 2017-09-07 DIAGNOSIS — R911 Solitary pulmonary nodule: Secondary | ICD-10-CM | POA: Insufficient documentation

## 2017-09-07 HISTORY — DX: Malignant (primary) neoplasm, unspecified: C80.1

## 2017-09-07 LAB — URINALYSIS, ROUTINE W REFLEX MICROSCOPIC
BILIRUBIN URINE: NEGATIVE
Bacteria, UA: NONE SEEN
GLUCOSE, UA: NEGATIVE mg/dL
Ketones, ur: NEGATIVE mg/dL
LEUKOCYTES UA: NEGATIVE
Nitrite: NEGATIVE
PROTEIN: NEGATIVE mg/dL
SPECIFIC GRAVITY, URINE: 1.012 (ref 1.005–1.030)
pH: 5 (ref 5.0–8.0)

## 2017-09-07 LAB — COMPREHENSIVE METABOLIC PANEL
ALK PHOS: 64 U/L (ref 38–126)
ALT: 11 U/L (ref 0–44)
AST: 24 U/L (ref 15–41)
Albumin: 3.6 g/dL (ref 3.5–5.0)
Anion gap: 11 (ref 5–15)
BILIRUBIN TOTAL: 0.8 mg/dL (ref 0.3–1.2)
BUN: 12 mg/dL (ref 8–23)
CALCIUM: 9.4 mg/dL (ref 8.9–10.3)
CO2: 22 mmol/L (ref 22–32)
CREATININE: 1.27 mg/dL — AB (ref 0.61–1.24)
Chloride: 106 mmol/L (ref 98–111)
GFR, EST NON AFRICAN AMERICAN: 52 mL/min — AB (ref >60)
Glucose, Bld: 97 mg/dL (ref 70–99)
Potassium: 3.5 mmol/L (ref 3.5–5.1)
Sodium: 139 mmol/L (ref 135–145)
Total Protein: 7.9 g/dL (ref 6.5–8.1)

## 2017-09-07 LAB — BLOOD GAS, ARTERIAL
Acid-base deficit: 0.4 mmol/L (ref 0.0–2.0)
Bicarbonate: 22.9 mmol/L (ref 20.0–28.0)
DRAWN BY: 470591
FIO2: 21
O2 SAT: 89.9 %
PCO2 ART: 32.1 mmHg (ref 32.0–48.0)
Patient temperature: 98.6
pH, Arterial: 7.467 — ABNORMAL HIGH (ref 7.350–7.450)
pO2, Arterial: 55.7 mmHg — ABNORMAL LOW (ref 83.0–108.0)

## 2017-09-07 LAB — CBC
HCT: 42.3 % (ref 39.0–52.0)
HEMOGLOBIN: 13.6 g/dL (ref 13.0–17.0)
MCH: 30.9 pg (ref 26.0–34.0)
MCHC: 32.2 g/dL (ref 30.0–36.0)
MCV: 96.1 fL (ref 78.0–100.0)
Platelets: 151 10*3/uL (ref 150–400)
RBC: 4.4 MIL/uL (ref 4.22–5.81)
RDW: 12.9 % (ref 11.5–15.5)
WBC: 7 10*3/uL (ref 4.0–10.5)

## 2017-09-07 LAB — TYPE AND SCREEN
ABO/RH(D): O POS
Antibody Screen: NEGATIVE

## 2017-09-07 LAB — PROTIME-INR
INR: 1.1
PROTHROMBIN TIME: 14.1 s (ref 11.4–15.2)

## 2017-09-07 LAB — SURGICAL PCR SCREEN
MRSA, PCR: NEGATIVE
Staphylococcus aureus: POSITIVE — AB

## 2017-09-07 LAB — ABO/RH: ABO/RH(D): O POS

## 2017-09-07 LAB — APTT: aPTT: 35 seconds (ref 24–36)

## 2017-09-07 NOTE — Progress Notes (Signed)
Anesthesia Chart Review:  Case:  [id number] Date/Time:  09/12/17 1016   Procedures:      MEDIASTINOSCOPY (N/A )     RIGHT VIDEO ASSISTED THORACOSCOPY (VATS)/ LOBECTOMY (Right Chest)   Anesthesia type:  General   Pre-op diagnosis:      Lung Cancer     RML Lung Nodule   Location:  [facility] OR ROOM 10 / [facility]   Surgeon:  [name], MD      DISCUSSION:  82 y.o. male scheduled for above procedure. Past medical history significant for chronic back pain, hypertension and dyslipidemia as well as long history of smoking.   Noted abnormal ABGs in the setting of non-small cell lung cancer.  Expect can proceed with surgery as planned barring acute status change.  VS: BP (!) 142/92   Pulse 81   Temp 36.8 C   Resp 18   Ht 5\' 8"  (1.727 m)   Wt 160 lb 1.6 oz (72.6 kg)   SpO2 100%   BMI 24.34 kg/m   PROVIDERS: Precious Gilding, PA is PCP at [place], MD is Oncologist last seen 08/25/2017 Tanda Rockers, MD is Pulmonologist last seen 07/12/2017   LABS: Labs reviewed: Acceptable for surgery. (all labs ordered are listed, but only abnormal results are displayed)  Labs Reviewed  SURGICAL PCR SCREEN - Abnormal; Notable for the following components:      Result Value   Staphylococcus aureus POSITIVE (*)    All other components within normal limits  BLOOD GAS, ARTERIAL - Abnormal; Notable for the following components:   pH, Arterial 7.467 (*)    pO2, Arterial 55.7 (*)    All other components within normal limits  COMPREHENSIVE METABOLIC PANEL - Abnormal; Notable for the following components:   Creatinine, Ser 1.27 (*)    GFR calc non Af Amer 52 (*)    All other components within normal limits  URINALYSIS, ROUTINE W REFLEX MICROSCOPIC - Abnormal; Notable for the following components:   Hgb urine dipstick MODERATE (*)    All other components within normal limits  APTT  CBC  PROTIME-INR  TYPE AND SCREEN  ABO/RH     IMAGES: CT Chest  06/16/2017: IMPRESSION: 1. Right middle lobe mass with spiculated margins. Mass measures 3 cm in greatest dimension, but is also contiguous with additional opacity extending to the anterior inferior aspect of the right middle lobe. Neoplasm is suspected. Tissue sampling is warranted. 2. Irregular nodular opacities in the right upper lobe extending to the apex. This may all be scarring. Cannot exclude any of the small nodular components as neoplastic disease. 3. Bilateral apical pleuroparenchymal scarring 4. Small noncalcified left lobe nodule measuring 4 mm. 5. Advanced emphysema. 6. Mild mediastinal and right hilar adenopathy. Multiple lymph nodes demonstrate calcifications consistent with changes from old, healed granulomatous disease. 7. Coronary artery calcifications.  EKG: 09/07/2017 shows NSR  CV: N/A  Past Medical History:  Diagnosis Date  . Cancer [facility])    possible lung cancer  . Chronic back pain   . Hypercholesterolemia   . Hypertension     Past Surgical History:  Procedure Laterality Date  . BRONCHIAL NEEDLE ASPIRATION BIOPSY  08/05/2017   Procedure: BRONCHIAL NEEDLE ASPIRATION BIOPSIES;  Surgeon: Juanito Doom, MD;  Location: [facility];  Service: Cardiopulmonary;;  . ENDOBRONCHIAL ULTRASOUND Bilateral 08/05/2017   Procedure: ENDOBRONCHIAL ULTRASOUND;  Surgeon: Juanito Doom, MD;  Location: [facility];  Service: Cardiopulmonary;  Laterality: Bilateral;  . MULTIPLE TOOTH EXTRACTIONS  MEDICATIONS: . amLODipine (NORVASC) 10 MG tablet  . aspirin EC 81 MG tablet  . lisinopril (PRINIVIL,ZESTRIL) 40 MG tablet  . meclizine (ANTIVERT) 12.5 MG tablet  . Oxycodone HCl 20 MG TABS  . simvastatin (ZOCOR) 20 MG tablet  . tadalafil (CIALIS) 5 MG tablet   No current facility-administered medications for this encounter.     Wynonia Musty [facility] Short Stay Center/Anesthesiology Phone [phone] 09/07/2017 3:04 PM

## 2017-09-12 ENCOUNTER — Other Ambulatory Visit: Payer: Self-pay | Admitting: *Deleted

## 2017-09-12 DIAGNOSIS — R911 Solitary pulmonary nodule: Secondary | ICD-10-CM

## 2017-09-19 ENCOUNTER — Encounter (HOSPITAL_COMMUNITY): Payer: Self-pay | Admitting: *Deleted

## 2017-09-19 ENCOUNTER — Inpatient Hospital Stay (HOSPITAL_COMMUNITY): Payer: Medicare Other

## 2017-09-19 ENCOUNTER — Encounter (HOSPITAL_COMMUNITY)
Admission: RE | Disposition: A | Discharge status: Home / Self Care | Payer: Self-pay | Source: Home / Self Care | Attending: Thoracic Surgery (Cardiothoracic Vascular Surgery)

## 2017-09-19 ENCOUNTER — Inpatient Hospital Stay (HOSPITAL_COMMUNITY): Payer: Medicare Other | Admitting: Certified Registered Nurse Anesthetist

## 2017-09-19 ENCOUNTER — Inpatient Hospital Stay (HOSPITAL_COMMUNITY)
Admission: RE | Admit: 2017-09-19 | Discharge: 2017-09-26 | DRG: 164 | Disposition: A | Discharge status: Home / Self Care | Payer: Medicare Other | Attending: Thoracic Surgery (Cardiothoracic Vascular Surgery) | Admitting: Thoracic Surgery (Cardiothoracic Vascular Surgery)

## 2017-09-19 ENCOUNTER — Other Ambulatory Visit: Payer: Self-pay

## 2017-09-19 ENCOUNTER — Inpatient Hospital Stay (HOSPITAL_COMMUNITY): Payer: Medicare Other | Admitting: Physician Assistant

## 2017-09-19 DIAGNOSIS — E785 Hyperlipidemia, unspecified: Secondary | ICD-10-CM | POA: Diagnosis present

## 2017-09-19 DIAGNOSIS — Z79899 Other long term (current) drug therapy: Secondary | ICD-10-CM

## 2017-09-19 DIAGNOSIS — Z87891 Personal history of nicotine dependence: Secondary | ICD-10-CM | POA: Diagnosis not present

## 2017-09-19 DIAGNOSIS — Z4682 Encounter for fitting and adjustment of non-vascular catheter: Secondary | ICD-10-CM

## 2017-09-19 DIAGNOSIS — D62 Acute posthemorrhagic anemia: Secondary | ICD-10-CM | POA: Diagnosis not present

## 2017-09-19 DIAGNOSIS — J90 Pleural effusion, not elsewhere classified: Secondary | ICD-10-CM | POA: Diagnosis present

## 2017-09-19 DIAGNOSIS — R918 Other nonspecific abnormal finding of lung field: Secondary | ICD-10-CM | POA: Diagnosis present

## 2017-09-19 DIAGNOSIS — J449 Chronic obstructive pulmonary disease, unspecified: Secondary | ICD-10-CM | POA: Diagnosis present

## 2017-09-19 DIAGNOSIS — G8929 Other chronic pain: Secondary | ICD-10-CM | POA: Diagnosis present

## 2017-09-19 DIAGNOSIS — R000 Tachycardia, unspecified: Secondary | ICD-10-CM | POA: Diagnosis not present

## 2017-09-19 DIAGNOSIS — Z79891 Long term (current) use of opiate analgesic: Secondary | ICD-10-CM

## 2017-09-19 DIAGNOSIS — I129 Hypertensive chronic kidney disease with stage 1 through stage 4 chronic kidney disease, or unspecified chronic kidney disease: Secondary | ICD-10-CM | POA: Diagnosis present

## 2017-09-19 DIAGNOSIS — N183 Chronic kidney disease, stage 3 (moderate): Secondary | ICD-10-CM | POA: Diagnosis present

## 2017-09-19 DIAGNOSIS — C342 Malignant neoplasm of middle lobe, bronchus or lung: Secondary | ICD-10-CM | POA: Diagnosis present

## 2017-09-19 DIAGNOSIS — Z7982 Long term (current) use of aspirin: Secondary | ICD-10-CM | POA: Diagnosis not present

## 2017-09-19 DIAGNOSIS — T8182XA Emphysema (subcutaneous) resulting from a procedure, initial encounter: Secondary | ICD-10-CM

## 2017-09-19 DIAGNOSIS — Z9889 Other specified postprocedural states: Secondary | ICD-10-CM

## 2017-09-19 DIAGNOSIS — J9382 Other air leak: Secondary | ICD-10-CM | POA: Diagnosis not present

## 2017-09-19 DIAGNOSIS — D696 Thrombocytopenia, unspecified: Secondary | ICD-10-CM | POA: Diagnosis not present

## 2017-09-19 DIAGNOSIS — Z902 Acquired absence of lung [part of]: Secondary | ICD-10-CM

## 2017-09-19 DIAGNOSIS — I480 Paroxysmal atrial fibrillation: Secondary | ICD-10-CM | POA: Diagnosis not present

## 2017-09-19 DIAGNOSIS — R911 Solitary pulmonary nodule: Secondary | ICD-10-CM

## 2017-09-19 HISTORY — PX: VIDEO ASSISTED THORACOSCOPY (VATS)/ LOBECTOMY: SHX6169

## 2017-09-19 HISTORY — PX: MEDIASTINOSCOPY: SHX5086

## 2017-09-19 LAB — POCT I-STAT 7, (LYTES, BLD GAS, ICA,H+H)
ACID-BASE EXCESS: 1 mmol/L (ref 0.0–2.0)
ACID-BASE EXCESS: 1 mmol/L (ref 0.0–2.0)
BICARBONATE: 26.3 mmol/L (ref 20.0–28.0)
BICARBONATE: 28.2 mmol/L — AB (ref 20.0–28.0)
Calcium, Ion: 1.24 mmol/L (ref 1.15–1.40)
Calcium, Ion: 1.22 mmol/L (ref 1.15–1.40)
HCT: 35 % — ABNORMAL LOW (ref 39.0–52.0)
HCT: 32 % — ABNORMAL LOW (ref 39.0–52.0)
Hemoglobin: 11.9 g/dL — ABNORMAL LOW (ref 13.0–17.0)
Hemoglobin: 10.9 g/dL — ABNORMAL LOW (ref 13.0–17.0)
O2 SAT: 100 %
O2 Saturation: 96 %
PH ART: 7.325 — AB (ref 7.350–7.450)
PO2 ART: 79 mmHg — AB (ref 83.0–108.0)
PO2 ART: 397 mmHg — AB (ref 83.0–108.0)
POTASSIUM: 3.6 mmol/L (ref 3.5–5.1)
Patient temperature: 35.8
Potassium: 4 mmol/L (ref 3.5–5.1)
SODIUM: 141 mmol/L (ref 135–145)
Sodium: 139 mmol/L (ref 135–145)
TCO2: 28 mmol/L (ref 22–32)
TCO2: 30 mmol/L (ref 22–32)
pCO2 arterial: 42.9 mmHg (ref 32.0–48.0)
pCO2 arterial: 53.3 mmHg — ABNORMAL HIGH (ref 32.0–48.0)
pH, Arterial: 7.391 (ref 7.350–7.450)

## 2017-09-19 LAB — CBC
HEMATOCRIT: 44.6 % (ref 39.0–52.0)
Hemoglobin: 14.1 g/dL (ref 13.0–17.0)
MCH: 30.9 pg (ref 26.0–34.0)
MCHC: 31.6 g/dL (ref 30.0–36.0)
MCV: 97.6 fL (ref 78.0–100.0)
PLATELETS: 153 10*3/uL (ref 150–400)
RBC: 4.57 MIL/uL (ref 4.22–5.81)
RDW: 13.1 % (ref 11.5–15.5)
WBC: 7.5 10*3/uL (ref 4.0–10.5)

## 2017-09-19 LAB — BASIC METABOLIC PANEL
Anion gap: 9 (ref 5–15)
BUN: 10 mg/dL (ref 8–23)
CHLORIDE: 110 mmol/L (ref 98–111)
CO2: 22 mmol/L (ref 22–32)
CREATININE: 1.23 mg/dL (ref 0.61–1.24)
Calcium: 9.3 mg/dL (ref 8.9–10.3)
GFR calc Af Amer: >60 mL/min (ref >60)
GFR calc non Af Amer: 54 mL/min — ABNORMAL LOW (ref >60)
GLUCOSE: 97 mg/dL (ref 70–99)
POTASSIUM: 3.5 mmol/L (ref 3.5–5.1)
Sodium: 141 mmol/L (ref 135–145)

## 2017-09-19 SURGERY — MEDIASTINOSCOPY
Anesthesia: General | Site: Chest | Laterality: Right

## 2017-09-19 MED ORDER — SUGAMMADEX SODIUM 200 MG/2ML IV SOLN
INTRAVENOUS | Status: AC
  Filled 2017-09-19: qty 2

## 2017-09-19 MED ORDER — CEFAZOLIN SODIUM-DEXTROSE 2-4 GM/100ML-% IV SOLN
2.0000 g | INTRAVENOUS | Status: AC
  Administered 2017-09-19: 2 g via INTRAVENOUS

## 2017-09-19 MED ORDER — SENNOSIDES-DOCUSATE SODIUM 8.6-50 MG PO TABS
1.0000 | ORAL_TABLET | Freq: Every day | ORAL | Status: DC
  Administered 2017-09-20 – 2017-09-25 (×4): 1 via ORAL
  Filled 2017-09-19 (×6): qty 1

## 2017-09-19 MED ORDER — CEFAZOLIN SODIUM-DEXTROSE 2-4 GM/100ML-% IV SOLN
2.0000 g | Freq: Three times a day (TID) | INTRAVENOUS | Status: AC
  Administered 2017-09-19 – 2017-09-20 (×2): 2 g via INTRAVENOUS
  Filled 2017-09-19 (×2): qty 100

## 2017-09-19 MED ORDER — ALBUMIN HUMAN 5 % IV SOLN
INTRAVENOUS | Status: DC | PRN
  Administered 2017-09-19: 18:00:00 via INTRAVENOUS

## 2017-09-19 MED ORDER — FENTANYL CITRATE (PF) 100 MCG/2ML IJ SOLN: 50.0000 ug | Freq: Once | INTRAMUSCULAR | Status: DC

## 2017-09-19 MED ORDER — PROPOFOL 10 MG/ML IV BOLUS
INTRAVENOUS | Status: AC
  Filled 2017-09-19: qty 20

## 2017-09-19 MED ORDER — FENTANYL CITRATE (PF) 100 MCG/2ML IJ SOLN
INTRAMUSCULAR | Status: AC
  Filled 2017-09-19: qty 2

## 2017-09-19 MED ORDER — DIPHENHYDRAMINE HCL 50 MG/ML IJ SOLN: 12.5000 mg | Freq: Four times a day (QID) | INTRAMUSCULAR | Status: DC | PRN

## 2017-09-19 MED ORDER — FENTANYL 40 MCG/ML IV SOLN
INTRAVENOUS | Status: DC
  Administered 2017-09-19: 1000 ug via INTRAVENOUS
  Administered 2017-09-19 – 2017-09-20 (×3): 10 ug via INTRAVENOUS
  Administered 2017-09-20: 30 ug via INTRAVENOUS
  Administered 2017-09-20 (×4): 20 ug via INTRAVENOUS
  Administered 2017-09-21: 40 ug via INTRAVENOUS
  Administered 2017-09-21: 30 ug via INTRAVENOUS
  Administered 2017-09-22: 40 ug via INTRAVENOUS
  Administered 2017-09-22: 60 ug via INTRAVENOUS
  Administered 2017-09-22: 10 ug via INTRAVENOUS
  Administered 2017-09-22: 20 ug via INTRAVENOUS
  Administered 2017-09-22: 90 ug via INTRAVENOUS
  Administered 2017-09-22: 10 ug via INTRAVENOUS
  Administered 2017-09-23: 80 ug via INTRAVENOUS
  Administered 2017-09-23 (×2): 20 ug via INTRAVENOUS
  Administered 2017-09-23: 4 ug via INTRAVENOUS
  Administered 2017-09-23: 50 ug via INTRAVENOUS
  Administered 2017-09-24: 0 ug via INTRAVENOUS
  Administered 2017-09-24: 10 ug via INTRAVENOUS
  Administered 2017-09-24: 3 ug via INTRAVENOUS
  Administered 2017-09-24: 20 ug via INTRAVENOUS
  Administered 2017-09-25: 50 ug via INTRAVENOUS
  Filled 2017-09-19 (×3): qty 25

## 2017-09-19 MED ORDER — PHENYLEPHRINE 40 MCG/ML (10ML) SYRINGE FOR IV PUSH (FOR BLOOD PRESSURE SUPPORT)
PREFILLED_SYRINGE | INTRAVENOUS | Status: AC
  Filled 2017-09-19: qty 10

## 2017-09-19 MED ORDER — FENTANYL CITRATE (PF) 100 MCG/2ML IJ SOLN
INTRAMUSCULAR | Status: AC
  Administered 2017-09-19: 50 ug
  Filled 2017-09-19: qty 2

## 2017-09-19 MED ORDER — HEMOSTATIC AGENTS (NO CHARGE) OPTIME
TOPICAL | Status: DC | PRN
  Administered 2017-09-19: 1 via TOPICAL

## 2017-09-19 MED ORDER — MEPERIDINE HCL 50 MG/ML IJ SOLN: 6.2500 mg | INTRAMUSCULAR | Status: DC | PRN

## 2017-09-19 MED ORDER — DEXAMETHASONE SODIUM PHOSPHATE 10 MG/ML IJ SOLN
INTRAMUSCULAR | Status: AC
  Filled 2017-09-19: qty 1

## 2017-09-19 MED ORDER — LUNG SURGERY BOOK
Freq: Once | Status: AC
  Administered 2017-09-20: 11:00:00
  Filled 2017-09-19: qty 1

## 2017-09-19 MED ORDER — LEVALBUTEROL HCL 0.63 MG/3ML IN NEBU
0.6300 mg | INHALATION_SOLUTION | Freq: Four times a day (QID) | RESPIRATORY_TRACT | Status: DC
  Administered 2017-09-19 – 2017-09-20 (×2): 0.63 mg via RESPIRATORY_TRACT
  Filled 2017-09-19 (×3): qty 3

## 2017-09-19 MED ORDER — FENTANYL CITRATE (PF) 100 MCG/2ML IJ SOLN
25.0000 ug | INTRAMUSCULAR | Status: DC | PRN
  Administered 2017-09-19 (×2): 50 ug via INTRAVENOUS

## 2017-09-19 MED ORDER — ONDANSETRON HCL 4 MG/2ML IJ SOLN
INTRAMUSCULAR | Status: DC | PRN
  Administered 2017-09-19: 4 mg via INTRAVENOUS

## 2017-09-19 MED ORDER — BUPIVACAINE HCL (PF) 0.5 % IJ SOLN
INTRAMUSCULAR | Status: DC | PRN
  Administered 2017-09-19: 30 mL

## 2017-09-19 MED ORDER — NALOXONE HCL 0.4 MG/ML IJ SOLN: 0.4000 mg | INTRAMUSCULAR | Status: DC | PRN

## 2017-09-19 MED ORDER — BUPIVACAINE HCL (PF) 0.5 % IJ SOLN
INTRAMUSCULAR | Status: AC
  Filled 2017-09-19: qty 30

## 2017-09-19 MED ORDER — PROMETHAZINE HCL 25 MG/ML IJ SOLN: 6.2500 mg | INTRAMUSCULAR | Status: DC | PRN

## 2017-09-19 MED ORDER — SODIUM CHLORIDE 0.9 % IJ SOLN
INTRAMUSCULAR | Status: DC | PRN
  Administered 2017-09-19: 50 mL via INTRAVENOUS

## 2017-09-19 MED ORDER — MIDAZOLAM HCL 2 MG/2ML IJ SOLN: 1.0000 mg | Freq: Once | INTRAMUSCULAR | Status: DC

## 2017-09-19 MED ORDER — TRAMADOL HCL 50 MG PO TABS: 50.0000 mg | ORAL_TABLET | Freq: Four times a day (QID) | ORAL | Status: DC | PRN

## 2017-09-19 MED ORDER — FENTANYL CITRATE (PF) 250 MCG/5ML IJ SOLN
INTRAMUSCULAR | Status: AC
  Filled 2017-09-19: qty 5

## 2017-09-19 MED ORDER — DIPHENHYDRAMINE HCL 12.5 MG/5ML PO ELIX
12.5000 mg | ORAL_SOLUTION | Freq: Four times a day (QID) | ORAL | Status: DC | PRN
  Filled 2017-09-19: qty 5

## 2017-09-19 MED ORDER — SIMVASTATIN 20 MG PO TABS
20.0000 mg | ORAL_TABLET | Freq: Every day | ORAL | Status: DC
  Administered 2017-09-20 – 2017-09-25 (×6): 20 mg via ORAL
  Filled 2017-09-19 (×6): qty 1

## 2017-09-19 MED ORDER — POTASSIUM CHLORIDE 10 MEQ/50ML IV SOLN: 10.0000 meq | Freq: Every day | INTRAVENOUS | Status: DC | PRN

## 2017-09-19 MED ORDER — LACTATED RINGERS IV SOLN
INTRAVENOUS | Status: DC | PRN
  Administered 2017-09-19 (×2): via INTRAVENOUS

## 2017-09-19 MED ORDER — METOCLOPRAMIDE HCL 5 MG/ML IJ SOLN
10.0000 mg | Freq: Four times a day (QID) | INTRAMUSCULAR | Status: AC
  Administered 2017-09-19 – 2017-09-24 (×15): 10 mg via INTRAVENOUS
  Filled 2017-09-19 (×18): qty 2

## 2017-09-19 MED ORDER — BUPIVACAINE LIPOSOME 1.3 % IJ SUSP
20.0000 mL | INTRAMUSCULAR | Status: AC
  Administered 2017-09-19: 20 mL
  Filled 2017-09-19: qty 20

## 2017-09-19 MED ORDER — 0.9 % SODIUM CHLORIDE (POUR BTL) OPTIME
TOPICAL | Status: DC | PRN
  Administered 2017-09-19: 3000 mL

## 2017-09-19 MED ORDER — AMLODIPINE BESYLATE 10 MG PO TABS
10.0000 mg | ORAL_TABLET | Freq: Every day | ORAL | Status: DC
  Administered 2017-09-21 – 2017-09-26 (×6): 10 mg via ORAL
  Filled 2017-09-19 (×7): qty 1

## 2017-09-19 MED ORDER — PHENYLEPHRINE HCL 10 MG/ML IJ SOLN
INTRAMUSCULAR | Status: DC | PRN
  Administered 2017-09-19 (×3): 40 ug via INTRAVENOUS

## 2017-09-19 MED ORDER — ROCURONIUM BROMIDE 10 MG/ML (PF) SYRINGE
PREFILLED_SYRINGE | INTRAVENOUS | Status: AC
  Filled 2017-09-19: qty 10

## 2017-09-19 MED ORDER — DEXAMETHASONE SODIUM PHOSPHATE 10 MG/ML IJ SOLN
INTRAMUSCULAR | Status: DC | PRN
  Administered 2017-09-19: 10 mg via INTRAVENOUS

## 2017-09-19 MED ORDER — SODIUM CHLORIDE 0.9 % IV SOLN
INTRAVENOUS | Status: DC | PRN
  Administered 2017-09-19 – 2017-09-22 (×3): via INTRAVENOUS

## 2017-09-19 MED ORDER — MIDAZOLAM HCL 2 MG/2ML IJ SOLN: 0.5000 mg | Freq: Once | INTRAMUSCULAR | Status: DC | PRN

## 2017-09-19 MED ORDER — ONDANSETRON HCL 4 MG/2ML IJ SOLN
INTRAMUSCULAR | Status: AC
  Filled 2017-09-19: qty 2

## 2017-09-19 MED ORDER — EPHEDRINE SULFATE 50 MG/ML IJ SOLN
INTRAMUSCULAR | Status: DC | PRN
  Administered 2017-09-19 (×2): 5 mg via INTRAVENOUS
  Administered 2017-09-19 (×2): 10 mg via INTRAVENOUS

## 2017-09-19 MED ORDER — ROCURONIUM BROMIDE 100 MG/10ML IV SOLN
INTRAVENOUS | Status: DC | PRN
  Administered 2017-09-19: 10 mg via INTRAVENOUS
  Administered 2017-09-19: 20 mg via INTRAVENOUS
  Administered 2017-09-19: 50 mg via INTRAVENOUS
  Administered 2017-09-19: 10 mg via INTRAVENOUS
  Administered 2017-09-19: 20 mg via INTRAVENOUS
  Administered 2017-09-19 (×2): 10 mg via INTRAVENOUS
  Administered 2017-09-19: 20 mg via INTRAVENOUS

## 2017-09-19 MED ORDER — ACETAMINOPHEN 500 MG PO TABS
1000.0000 mg | ORAL_TABLET | Freq: Four times a day (QID) | ORAL | Status: AC
  Administered 2017-09-19 – 2017-09-24 (×15): 1000 mg via ORAL
  Filled 2017-09-19 (×15): qty 2

## 2017-09-19 MED ORDER — POTASSIUM CHLORIDE IN NACL 20-0.9 MEQ/L-% IV SOLN
INTRAVENOUS | Status: DC
  Administered 2017-09-19: 21:00:00 via INTRAVENOUS
  Filled 2017-09-19 (×2): qty 1000

## 2017-09-19 MED ORDER — SUGAMMADEX SODIUM 200 MG/2ML IV SOLN
INTRAVENOUS | Status: DC | PRN
  Administered 2017-09-19: 200 mg via INTRAVENOUS

## 2017-09-19 MED ORDER — ORAL CARE MOUTH RINSE
15.0000 mL | Freq: Two times a day (BID) | OROMUCOSAL | Status: DC
  Administered 2017-09-20 – 2017-09-25 (×10): 15 mL via OROMUCOSAL

## 2017-09-19 MED ORDER — FENTANYL CITRATE (PF) 100 MCG/2ML IJ SOLN
INTRAMUSCULAR | Status: DC | PRN
  Administered 2017-09-19: 100 ug via INTRAVENOUS
  Administered 2017-09-19: 50 ug via INTRAVENOUS
  Administered 2017-09-19: 100 ug via INTRAVENOUS
  Administered 2017-09-19 (×2): 50 ug via INTRAVENOUS

## 2017-09-19 MED ORDER — LACTATED RINGERS IV SOLN
INTRAVENOUS | Status: DC
  Administered 2017-09-19: 10:00:00 via INTRAVENOUS

## 2017-09-19 MED ORDER — ACETAMINOPHEN 160 MG/5ML PO SOLN: 1000.0000 mg | Freq: Four times a day (QID) | ORAL | Status: AC

## 2017-09-19 MED ORDER — SUCCINYLCHOLINE CHLORIDE 200 MG/10ML IV SOSY
PREFILLED_SYRINGE | INTRAVENOUS | Status: AC
  Filled 2017-09-19: qty 10

## 2017-09-19 MED ORDER — LISINOPRIL 20 MG PO TABS
40.0000 mg | ORAL_TABLET | Freq: Every day | ORAL | Status: DC
  Filled 2017-09-19: qty 2

## 2017-09-19 MED ORDER — BISACODYL 5 MG PO TBEC
10.0000 mg | DELAYED_RELEASE_TABLET | Freq: Every day | ORAL | Status: DC
  Administered 2017-09-20 – 2017-09-26 (×3): 10 mg via ORAL
  Filled 2017-09-19 (×6): qty 2

## 2017-09-19 MED ORDER — MIDAZOLAM HCL 2 MG/2ML IJ SOLN
INTRAMUSCULAR | Status: AC
  Administered 2017-09-19: 1 mg
  Filled 2017-09-19: qty 2

## 2017-09-19 MED ORDER — CEFAZOLIN SODIUM-DEXTROSE 2-4 GM/100ML-% IV SOLN
INTRAVENOUS | Status: AC
  Filled 2017-09-19: qty 100

## 2017-09-19 MED ORDER — PROPOFOL 10 MG/ML IV BOLUS
INTRAVENOUS | Status: DC | PRN
  Administered 2017-09-19: 100 mg via INTRAVENOUS

## 2017-09-19 MED ORDER — ONDANSETRON HCL 4 MG/2ML IJ SOLN: 4.0000 mg | Freq: Four times a day (QID) | INTRAMUSCULAR | Status: DC | PRN

## 2017-09-19 MED ORDER — OXYCODONE HCL 5 MG PO TABS: 20.0000 mg | ORAL_TABLET | Freq: Four times a day (QID) | ORAL | Status: DC

## 2017-09-19 MED ORDER — SODIUM CHLORIDE 0.9% FLUSH
9.0000 mL | INTRAVENOUS | Status: DC | PRN
  Administered 2017-09-23: 10 mL via INTRAVENOUS
  Filled 2017-09-19: qty 9

## 2017-09-19 MED ORDER — CHLORHEXIDINE GLUCONATE CLOTH 2 % EX PADS
6.0000 | MEDICATED_PAD | Freq: Every day | CUTANEOUS | Status: DC
  Administered 2017-09-20 – 2017-09-26 (×7): 6 via TOPICAL

## 2017-09-19 MED ORDER — SODIUM CHLORIDE 0.9% FLUSH
10.0000 mL | Freq: Two times a day (BID) | INTRAVENOUS | Status: DC
  Administered 2017-09-20 – 2017-09-25 (×9): 10 mL

## 2017-09-19 SURGICAL SUPPLY — 105 items
APPLICATOR COTTON TIP 6 STRL (MISCELLANEOUS) ×2 IMPLANT
APPLICATOR COTTON TIP 6IN STRL (MISCELLANEOUS) ×4
APPLIER CLIP LOGIC TI 5 (MISCELLANEOUS) IMPLANT
APPLIER CLIP ROT 10 11.4 M/L (STAPLE)
CANISTER SUCT 3000ML PPV (MISCELLANEOUS) ×4 IMPLANT
CATH THORACIC 28FR (CATHETERS) ×4 IMPLANT
CATH THORACIC 28FR RT ANG (CATHETERS) IMPLANT
CATH THORACIC 36FR (CATHETERS) IMPLANT
CATH THORACIC 36FR RT ANG (CATHETERS) IMPLANT
CLIP APPLIE ROT 10 11.4 M/L (STAPLE) IMPLANT
CLIP VESOCCLUDE MED 6/CT (CLIP) ×4 IMPLANT
CONN ST 1/4X3/8  BEN (MISCELLANEOUS) ×2
CONN ST 1/4X3/8 BEN (MISCELLANEOUS) ×2 IMPLANT
CONN Y 3/8X3/8X3/8  BEN (MISCELLANEOUS)
CONN Y 3/8X3/8X3/8 BEN (MISCELLANEOUS) IMPLANT
CONT SPEC 4OZ CLIKSEAL STRL BL (MISCELLANEOUS) ×92 IMPLANT
COVER SURGICAL LIGHT HANDLE (MISCELLANEOUS) ×4 IMPLANT
CUTTER ECHEON FLEX ENDO 45 340 (ENDOMECHANICALS) ×8 IMPLANT
DERMABOND ADVANCED (GAUZE/BANDAGES/DRESSINGS) ×4
DERMABOND ADVANCED .7 DNX12 (GAUZE/BANDAGES/DRESSINGS) ×4 IMPLANT
DRAIN CHANNEL 28F RND 3/8 FF (WOUND CARE) IMPLANT
DRAIN CHANNEL 32F RND 10.7 FF (WOUND CARE) ×4 IMPLANT
DRAPE CHEST BREAST 15X10 FENES (DRAPES) ×4 IMPLANT
DRAPE LAPAROSCOPIC ABDOMINAL (DRAPES) ×4 IMPLANT
DRAPE WARM FLUID 44X44 (DRAPE) ×4 IMPLANT
ELECT BLADE 6.5 EXT (BLADE) ×4 IMPLANT
ELECT CAUTERY BLADE 6.4 (BLADE) ×4 IMPLANT
ELECT REM PT RETURN 9FT ADLT (ELECTROSURGICAL) ×4
ELECTRODE REM PT RTRN 9FT ADLT (ELECTROSURGICAL) ×2 IMPLANT
GAUZE SPONGE 4X4 12PLY STRL (GAUZE/BANDAGES/DRESSINGS) ×4 IMPLANT
GAUZE SPONGE 4X4 16PLY XRAY LF (GAUZE/BANDAGES/DRESSINGS) ×4 IMPLANT
GLOVE BIO SURGEON STRL SZ 6.5 (GLOVE) ×3 IMPLANT
GLOVE BIO SURGEONS STRL SZ 6.5 (GLOVE) ×1
GLOVE BIOGEL PI IND STRL 6 (GLOVE) ×2 IMPLANT
GLOVE BIOGEL PI IND STRL 6.5 (GLOVE) ×2 IMPLANT
GLOVE BIOGEL PI INDICATOR 6 (GLOVE) ×2
GLOVE BIOGEL PI INDICATOR 6.5 (GLOVE) ×2
GLOVE ECLIPSE 6.5 STRL STRAW (GLOVE) ×4 IMPLANT
GLOVE SURG SIGNA 7.5 PF LTX (GLOVE) ×16 IMPLANT
GOWN STRL REUS W/ TWL LRG LVL3 (GOWN DISPOSABLE) ×8 IMPLANT
GOWN STRL REUS W/ TWL XL LVL3 (GOWN DISPOSABLE) ×4 IMPLANT
GOWN STRL REUS W/TWL LRG LVL3 (GOWN DISPOSABLE) ×8
GOWN STRL REUS W/TWL XL LVL3 (GOWN DISPOSABLE) ×4
HEMOSTAT SURGICEL 2X14 (HEMOSTASIS) ×8 IMPLANT
KIT BASIN OR (CUSTOM PROCEDURE TRAY) ×4 IMPLANT
KIT SUCTION CATH 14FR (SUCTIONS) IMPLANT
KIT TURNOVER KIT B (KITS) ×4 IMPLANT
NEEDLE HYPO 25GX1X1/2 BEV (NEEDLE) ×4 IMPLANT
NEEDLE SPNL 18GX3.5 QUINCKE PK (NEEDLE) ×4 IMPLANT
NEEDLE SPNL 22GX3.5 QUINCKE BK (NEEDLE) IMPLANT
NS IRRIG 1000ML POUR BTL (IV SOLUTION) ×12 IMPLANT
PACK CHEST (CUSTOM PROCEDURE TRAY) ×4 IMPLANT
PACK GENERAL/GYN (CUSTOM PROCEDURE TRAY) ×4 IMPLANT
PAD ARMBOARD 7.5X6 YLW CONV (MISCELLANEOUS) ×8 IMPLANT
POUCH ENDO CATCH II 15MM (MISCELLANEOUS) IMPLANT
POUCH SPECIMEN RETRIEVAL 10MM (ENDOMECHANICALS) ×8 IMPLANT
RELOAD STAPLER GOLD 60MM (STAPLE) ×2 IMPLANT
SCISSORS ENDO CVD 5DCS (MISCELLANEOUS) IMPLANT
SEALANT PROGEL (MISCELLANEOUS) ×4 IMPLANT
SEALANT SURG COSEAL 4ML (VASCULAR PRODUCTS) IMPLANT
SEALANT SURG COSEAL 8ML (VASCULAR PRODUCTS) IMPLANT
SHEARS HARMONIC HDI 20CM (ELECTROSURGICAL) ×8 IMPLANT
SOLUTION ANTI FOG 6CC (MISCELLANEOUS) ×4 IMPLANT
SPECIMEN JAR MEDIUM (MISCELLANEOUS) IMPLANT
SPONGE INTESTINAL PEANUT (DISPOSABLE) ×28 IMPLANT
SPONGE TONSIL TAPE 1 RFD (DISPOSABLE) ×8 IMPLANT
STAPLE RELOAD 2.5MM WHITE (STAPLE) ×12 IMPLANT
STAPLE RELOAD 45 GRN (STAPLE) ×8 IMPLANT
STAPLE RELOAD 45MM GOLD (STAPLE) ×64 IMPLANT
STAPLE RELOAD 45MM GREEN (STAPLE) ×8
STAPLER ECHELON POWERED (MISCELLANEOUS) ×4 IMPLANT
STAPLER RELOAD GOLD 60MM (STAPLE) ×4
STAPLER VASCULAR ECHELON 35 (CUTTER) ×4 IMPLANT
SUT PROLENE 4 0 RB 1 (SUTURE)
SUT PROLENE 4-0 RB1 .5 CRCL 36 (SUTURE) IMPLANT
SUT SILK  1 MH (SUTURE) ×4
SUT SILK 1 MH (SUTURE) ×4 IMPLANT
SUT SILK 1 TIES 10X30 (SUTURE) ×4 IMPLANT
SUT SILK 2 0 (SUTURE)
SUT SILK 2 0 SH (SUTURE) IMPLANT
SUT SILK 2 0SH CR/8 30 (SUTURE) IMPLANT
SUT SILK 2-0 18XBRD TIE 12 (SUTURE) IMPLANT
SUT SILK 3 0 SH 30 (SUTURE) IMPLANT
SUT SILK 3 0SH CR/8 30 (SUTURE) ×4 IMPLANT
SUT VIC AB 1 CTX 36 (SUTURE) ×4
SUT VIC AB 1 CTX36XBRD ANBCTR (SUTURE) ×4 IMPLANT
SUT VIC AB 2-0 CT1 27 (SUTURE)
SUT VIC AB 2-0 CT1 TAPERPNT 27 (SUTURE) IMPLANT
SUT VIC AB 2-0 CTX 36 (SUTURE) ×8 IMPLANT
SUT VIC AB 3-0 MH 27 (SUTURE) IMPLANT
SUT VIC AB 3-0 SH 18 (SUTURE) ×4 IMPLANT
SUT VIC AB 3-0 X1 27 (SUTURE) ×4 IMPLANT
SUT VICRYL 2 TP 1 (SUTURE) IMPLANT
SUT VICRYL 4-0 PS2 18IN ABS (SUTURE) ×4 IMPLANT
SYR 10ML LL (SYRINGE) IMPLANT
SYR 30ML LL (SYRINGE) ×4 IMPLANT
SYSTEM SAHARA CHEST DRAIN ATS (WOUND CARE) ×4 IMPLANT
TAPE CLOTH 4X10 WHT NS (GAUZE/BANDAGES/DRESSINGS) ×4 IMPLANT
TIP APPLICATOR SPRAY EXTEND 16 (VASCULAR PRODUCTS) ×4 IMPLANT
TOWEL GREEN STERILE (TOWEL DISPOSABLE) ×4 IMPLANT
TOWEL GREEN STERILE FF (TOWEL DISPOSABLE) ×4 IMPLANT
TRAP SPECIMEN MUCOUS 40CC (MISCELLANEOUS) ×4 IMPLANT
TRAY FOLEY MTR SLVR 16FR STAT (SET/KITS/TRAYS/PACK) ×4 IMPLANT
TROCAR XCEL BLADELESS 5X75MML (TROCAR) ×4 IMPLANT
WATER STERILE IRR 1000ML POUR (IV SOLUTION) ×4 IMPLANT

## 2017-09-19 NOTE — Interval H&P Note (Signed)
History and Physical Interval Note: After meeting with Oncology and Radiation oncology, [name] has decided to proceed with surgical resection for treatment of his RML mass. We ill plan to do mediastinoscopy first to conform EBUS results (due to possibility of a false negative with needle aspirations. Then Right VATS for middle lobectomy if nodes are clear. He is aware of the indications, risks, benefits and alternatives.  09/19/2017 12:34 PM  Danny Duke  has presented today for surgery, with the diagnosis of Lung Cancer RML Lung Nodule  The various methods of treatment have been discussed with the patient and family. After consideration of risks, benefits and other options for treatment, the patient has consented to  Procedure(s): MEDIASTINOSCOPY (N/A) RIGHT VIDEO ASSISTED THORACOSCOPY (VATS)/ LOBECTOMY (Right) as a surgical intervention .  The patient's history has been reviewed, patient examined, no change in status, stable for surgery.  I have reviewed the patient's chart and labs.  Questions were answered to the patient's satisfaction.     Danny Duke

## 2017-09-19 NOTE — H&P (Signed)
PCP is [place], Erline Levine, [place] Referring Provider is Tanda Rockers, MD      Chief Complaint  Patient presents with  . Lung Mass    new patient, Chest CT 06/16/2017, PET 07/05/2017    HPI: Mr. Champoux sent for consultation regarding a right middle lobe mass.  Dontaye Hur is a 82 year old gentleman with a history of hypertension, hyperlipidemia, chronic back pain, arthritis, and remote tobacco abuse (quit 25 years ago).  His wife thinks he may have emphysema as well.  He was in his usual state of health until recently when he went for an annual physical.  A chest x-ray was done which showed a right middle lobe opacity.  A CT of the chest showed a 3 x 2.9 x 2.8 cm spiculated opacity in the right middle lobe.  There also were some calcified granulomas in the right upper lobe and a noncalcified nodule in the left upper lobe.  On PET CT the nodule was markedly hypermetabolic with an SUV of 20.  There were hypermetabolic right paratracheal, left paratracheal, subcarinal, and bilateral hilar lymph nodes.  There was low-level activity in a small pleural effusion.  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  He underwent endobronchial ultrasound by Dr. Lake Bells on 08/05/2017.  Aspirations of 4R and 7 lymph nodes were negative for tumor.  He says his appetite has been fair.  He is lost about 5 pounds over the past 3 months.  He has not had any cough or hemoptysis.  He has not noted any unusual shortness of breath or wheezing.  He denies chest pain, pressure, or tightness at rest or with activity.  He has not had any unusual headaches or visual changes.  Zubrod Score: At the time of surgery this patient's most appropriate activity status/level should be described as: [x]     0    Normal activity, no symptoms []     1    Restricted in physical strenuous activity but ambulatory, able to do out light work []     2    Ambulatory and capable of self care, unable to do  work activities, up and about >50 % of waking hours                              []     3    Only limited self care, in bed greater than 50% of waking hours []     4    Completely disabled, no self care, confined to bed or chair []     5    Moribund      Past Medical History:  Diagnosis Date  . Chronic back pain   . Hypercholesterolemia   . Hypertension          Past Surgical History:  Procedure Laterality Date  . BRONCHIAL NEEDLE ASPIRATION BIOPSY  08/05/2017   Procedure: BRONCHIAL NEEDLE ASPIRATION BIOPSIES;  Surgeon: Juanito Doom, MD;  Location: [facility];  Service: Cardiopulmonary;;  . ENDOBRONCHIAL ULTRASOUND Bilateral 08/05/2017   Procedure: ENDOBRONCHIAL ULTRASOUND;  Surgeon: Juanito Doom, MD;  Location: [facility];  Service: Cardiopulmonary;  Laterality: Bilateral;         Family History  Problem Relation Age of Onset  . Colon cancer Brother   . Stomach cancer Brother     Social History Social History        Tobacco Use  . Smoking status: Former Smoker  Packs/day: 0.50    Years: 40.00    Pack years: 20.00    Last attempt to quit: 03/08/1997    Years since quitting: 20.4  . Smokeless tobacco: Never Used  Substance Use Topics  . Alcohol use: No  . Drug use: No          Current Outpatient Medications  Medication Sig Dispense Refill  . amLODipine (NORVASC) 10 MG tablet Take 10 mg by mouth daily.    Marland Kitchen aspirin EC 81 MG tablet Take 81 mg by mouth daily.    Marland Kitchen lisinopril (PRINIVIL,ZESTRIL) 40 MG tablet Take 40 mg by mouth daily.    . meclizine (ANTIVERT) 12.5 MG tablet Take 1 tablet (12.5 mg total) by mouth 3 (three) times daily as needed for dizziness. 30 tablet 0  . oxycodone (ROXICODONE) 30 MG immediate release tablet Take 20 mg by mouth 4 (four) times daily.     . simvastatin (ZOCOR) 20 MG tablet Take 20 mg by mouth daily.     No current facility-administered medications for this visit.     No Known  Allergies  Review of Systems  Constitutional: Positive for unexpected weight change (Lost 5 pounds in 3 months). Negative for activity change, appetite change, chills, diaphoresis and fever.  HENT: Negative for trouble swallowing and voice change.   Eyes: Negative for visual disturbance.  Respiratory: Negative for chest tightness, shortness of breath and wheezing.   Cardiovascular: Negative for chest pain and leg swelling.  Gastrointestinal: Negative for abdominal pain and anal bleeding.  Genitourinary: Negative for difficulty urinating and dysuria.  Musculoskeletal: Positive for arthralgias and back pain.  Neurological: Negative for dizziness, seizures and syncope.  Hematological: Negative for adenopathy. Does not bruise/bleed easily.    BP 137/84 (BP Location: Right Arm, Patient Position: Sitting, Cuff Size: Normal)   Pulse 81   Resp 18   Ht 5\' 8"  (1.727 m)   Wt 161 lb 6.4 oz (73.2 kg)   SpO2 98% Comment: RA  BMI 24.54 kg/m  Physical Exam  Constitutional: He is oriented to person, place, and time. He appears well-developed and well-nourished. No distress.  HENT:  Head: Normocephalic and atraumatic.  Mouth/Throat: No oropharyngeal exudate.  Eyes: Pupils are equal, round, and reactive to light. Conjunctivae and EOM are normal. No scleral icterus.  Neck: Neck supple. No thyromegaly present.  Cardiovascular: Normal rate, regular rhythm and normal heart sounds. Exam reveals no gallop and no friction rub.  No murmur heard. Pulmonary/Chest: Effort normal and breath sounds normal. No stridor. No respiratory distress. He has no wheezes.  Abdominal: Soft. He exhibits no distension. There is no tenderness.  Musculoskeletal: He exhibits no edema.  Lymphadenopathy:    He has no cervical adenopathy.  Neurological: He is alert and oriented to person, place, and time. No cranial nerve deficit. He exhibits normal muscle tone. Coordination normal.  Skin: Skin is warm and dry.  Vitals  reviewed.    Diagnostic Tests: CT CHEST WITH CONTRAST  TECHNIQUE: Multidetector CT imaging of the chest was performed during intravenous contrast administration.  Creatinine was obtained on site at [facility] at [address]  Results: Creatinine 1.4 mg/dL. BUN 21. GFR 55.  CONTRAST: 43mL ISOVUE-300 IOPAMIDOL (ISOVUE-300) INJECTION 61%  COMPARISON: None.  FINDINGS: Cardiovascular: Heart is normal in size. No pericardial effusion. Three-vessel coronary artery calcifications, most evident on the left. Great vessels are normal in caliber. No aortic dissection or atherosclerosis.  Mediastinum/Nodes: No neck base or axillary masses or pathologically enlarged nodes  are subcentimeter thyroid nodules.  Prominent mildly enlarged mediastinal and hilar lymph nodes, many with associated calcifications. Right paratracheal node measures 13 mm short axis. Subcarinal node measures 19 mm short axis. Right superior hilar node measures 2.8 x 1.6 cm.  Trachea is patent. Esophagus is mildly distended but otherwise unremarkable.  Lungs/Pleura: Trace right pleural effusion.  In the right middle lobe, there is a masslike opacity with spiculated margins. It measures 3.0 x 2.9 x 2.8 cm. There is contiguous opacity extends to the anterior N inferior pleural margins with intervening cystic change. Mass causes retraction of the oblique fissure anteriorly.  There is pleuroparenchymal scarring at both apices. The right, irregular nodular opacities lie in the right lobe extending to the area pleuroparenchymal scarring.  There are 2 discrete calcified granuloma the right upper lobe. 4 mm noncalcified nodule in the left upper lobe, image 54, series 5.  Lungs also show significant changes of emphysema with other areas of mild interstitial thickening peripherally. Scarring with a Siemens noted in both lung bases.  No pulmonary edema. No  pneumothorax.  Upper Abdomen: No acute findings. No visualized liver or adrenal masses.  Musculoskeletal: No fracture or acute finding. No osteoblastic or osteolytic lesions.  IMPRESSION: 1. Right middle lobe mass with spiculated margins. Mass measures 3 cm in greatest dimension, but is also contiguous with additional opacity extending to the anterior inferior aspect of the right middle lobe. Neoplasm is suspected. Tissue sampling is warranted. 2. Irregular nodular opacities in the right upper lobe extending to the apex. This may all be scarring. Cannot exclude any of the small nodular components as neoplastic disease. 3. Bilateral apical pleuroparenchymal scarring 4. Small noncalcified left lobe nodule measuring 4 mm. 5. Advanced emphysema. 6. Mild mediastinal and right hilar adenopathy. Multiple lymph nodes demonstrate calcifications consistent with changes from old, healed granulomatous disease. 7. Coronary artery calcifications.  Emphysema (ICD10-J43.9).   Electronically Signed By: Lajean Manes M.D. On: 06/16/2017 17:15 NUCLEAR MEDICINE PET SKULL BASE TO THIGH  TECHNIQUE: 8.2 mCi F-18 FDG was injected intravenously. Full-ring PET imaging was performed from the skull base to thigh after the radiotracer. CT data was obtained and used for attenuation correction and anatomic localization.  Fasting blood glucose: 98 mg/dl  COMPARISON: Chest CT from 06/16/2017  FINDINGS: Mediastinal blood pool activity: SUV max 2.3  NECK: Substantial misregistration of PET and CT data in the head and neck region, I did pay special attention to the non attenuation corrected data because of this.  No significant abnormal accentuated metabolic activity in the neck.  Incidental CT findings: None  CHEST: Spiculated right middle lobe mass 3.4 by 2.5 cm, previously 3.2 by 2.8 cm by my measurements. This has a maximum SUV of 20.1. An adjacent right middle lobe nodule  measuring approximately 1.9 by 1.4 cm on image 48/8 has a maximum SUV of 20.9.  There is hypermetabolic right paratracheal, prevascular, left paratracheal, bilateral hilar, bilateral infrahilar, and subcarinal adenopathy. Index right paratracheal node measuring 1.6 cm in short axis on image 51/4 has a maximum SUV of 4.6. Right hilar adenopathy with speckled calcifications has a maximum SUV of 8.2.  Small right pleural effusion with trace pleural thickening and very low-level activity with SUV of about 2.0.  Indistinct and irregular biapical densities with some faint calcifications and mild accentuated metabolic activity, maximum SUV 4.5 on the right and 5.3 on the left.  Incidental CT findings: Severe centrilobular emphysema. Coronary, aortic arch, and branch vessel atherosclerotic vascular disease.  ABDOMEN/PELVIS: Indistinctly marginated  portacaval lymph node has mildly accentuated metabolic activity with maximum SUV 5.0, and short axis diameter estimated at 1.0 cm.  Incidental CT findings: Nonspecific 0.8 cm hypodense lesion inferiorly in the right hepatic lobe without accentuated metabolic activity. Photopenic fluid density lesion of the left kidney upper pole favoring cyst. Similar low-density lesion of the left kidney lower pole.  Infrarenal abdominal aortic aneurysm with mural thrombus, measuring 3.5 by 3.4 cm on image 120/4.  SKELETON: No hypermetabolic skeletal metastatic lesion is identified.  Incidental CT findings: Thoracic kyphosis. Lower lumbar degenerative facet arthropathy.  IMPRESSION: 1. The right middle lobe mass and adjacent right middle lobe nodule are both highly hypermetabolic on today's examination. In addition, there is biapical scarring which is mildly hypermetabolic, and mildly hypermetabolic scattered lymph nodes in the mediastinum and hila which are partially calcified. Although malignancy is clearly a concern in the right middle  lobe, part or all of the appearance could also be caused by active granulomatous disease. The possibility of active granulomatous disease is somewhat emphasized in this case given that the biapical scarring and calcified lymph nodes would be more characteristic of a prior granulomatous process. That said, the right middle lobe lesions likely warrant sampling as these lesions are substantially more metabolic than the rest of the findings. 2. Small right pleural effusion with trace pleural thickening and low-grade activity. 3. Fusiform infrarenal abdominal aortic aneurysm, 3.5 cm in diameter. Recommend followup by [place] in 2 years. This recommendation follows ACR consensus guidelines: White Paper of the ACR Incidental Findings Committee II on Vascular Findings. J Am Coll Radiol 2013; 10:789-794. 4. Other imaging findings of potential clinical significance: Aortic Atherosclerosis (ICD10-I70.0) and Emphysema (ICD10-J43.9). Coronary atherosclerosis. Hypodense hepatic and left renal lesions are likely cysts. Thoracic kyphosis.   Electronically Signed By: Van Clines M.D. On: 07/05/2017 15:40 I personally reviewed the CT and PET/CT images and concur with the findings noted above.  Impression: Mr. Naves is a 82 year old gentleman with a remote history of tobacco abuse, and a history of hypertension, hyperlipidemia, arthritis, chronic back pain, and Gold class I COPD.  He recently was found to have a right middle lobe nodule on a chest x-ray was done at the time of a routine physical.  CT confirmed a spiculated right middle lobe nodule.  A CT of the chest showed a 3 x 2.9 x 2.8 cm spiculated mass in the right middle lobe.  On PET CT the nodule was interpreted as 2 separate nodules both with an SUV of 20.  It is unclear if there actually are 2 separate nodules such as a primary and a satellite or whether this is one more cavitary nodule.  On CT it appears more like a single mass with  some central cavitation.  CT-guided biopsy of the nodule showed squamous cell carcinoma.  Endobronchial ultrasound with lymph node aspiration showed no evidence of involvement of the lymph nodes.  I had a long discussion with Mr. and Mrs. Lenzo.  I reviewed the films with them.  We discussed that although clinical staging would suggest T2, N3, stage IIIb disease, pathologic staging at this point suggest T2, N0, stage Ib disease.  I also discussed the potential for a false negative biopsy with endobronchial ultrasound due to the limited sample size.  I do think he would need a formal mediastinoscopy before proceeding with resection.  He could be done at the same setting.  I recommended to them that we proceed with mediastinoscopy, right VATS for right middle lobectomy.  We would only proceed with the resection if the mediastinoscopy was truly negative.  I described the general nature of the procedure including the incisions to be used, the need for general anesthesia, the use of a drainage tube postoperatively, the expected hospital stay, and the overall recovery.  I informed him of the indications, risks, benefits, and alternatives.  They understand the risks include, but not limited to death, MI, stroke, DVT, PE, bleeding, possible need for transfusion, infection, recurrent nerve injury, prolonged air leak, cardiac arrhythmias, as well as possibility of other procedural complications.  Mrs. Thielke is much more interested in pursuing surgery than Mr. Sawaya is.  He says that he just does not want to be cut on.  He wanted to know what the alternative treatments were.  I briefly discussed radiation with or without chemotherapy as a treatment option.  He wishes to speak with radiation oncology medical oncology before making a decision as to how to proceed.  I will arrange for him to be seen in our multidisciplinary thoracic oncology clinic next week.  Plan: [place] next week to see radiation oncology and  medical oncology.  I will follow-up with him after he has had a chance to talk with them.  We can further discuss surgery as an option at that time.  [name], MD [facility] [phone]

## 2017-09-19 NOTE — Transfer of Care (Signed)
Immediate Anesthesia Transfer of Care Note  Patient: Danny Duke  Procedure(s) Performed: MEDIASTINOSCOPY (N/A ) RIGHT VIDEO ASSISTED THORACOSCOPY (VATS)/ LOBECTOMY (Right Chest)  Patient Location: PACU  Anesthesia Type:General  Level of Consciousness: drowsy  Airway & Oxygen Therapy: Patient Spontanous Breathing and Patient connected to face mask oxygen  Post-op Assessment: Report given to RN and Post -op Vital signs reviewed and stable  Post vital signs: Reviewed and stable  Last Vitals:  Vitals Value Taken Time  BP    Temp    Pulse    Resp    SpO2      Last Pain:  Vitals:   09/19/17 1002  TempSrc:   PainSc: 0-No pain      Patients Stated Pain Goal: 2 (17/49/44 9675)  Complications: No apparent anesthesia complications

## 2017-09-19 NOTE — Anesthesia Procedure Notes (Signed)
Procedure Name: Intubation Date/Time: 09/19/2017 12:58 PM Performed by: Shirlyn Goltz, CRNA Pre-anesthesia Checklist: Patient identified, Emergency Drugs available, Suction available and Patient being monitored Patient Re-evaluated:Patient Re-evaluated prior to induction Oxygen Delivery Method: Circle system utilized Preoxygenation: Pre-oxygenation with 100% oxygen Induction Type: IV induction Ventilation: Mask ventilation without difficulty Laryngoscope Size: Mac and 4 Grade View: Grade I Tube type: Oral Tube size: 8.5 mm Number of attempts: 1 Airway Equipment and Method: Stylet Placement Confirmation: ETT inserted through vocal cords under direct vision,  positive ETCO2 and breath sounds checked- equal and bilateral Secured at: 22 cm Tube secured with: Tape Dental Injury: Teeth and Oropharynx as per pre-operative assessment

## 2017-09-19 NOTE — Anesthesia Preprocedure Evaluation (Addendum)
Anesthesia Evaluation  Patient identified by MRN, date of birth, ID band Patient awake    Reviewed: Allergy & Precautions, NPO status , Patient's Chart, lab work & pertinent test results  History of Anesthesia Complications Negative for: history of anesthetic complications  Airway Mallampati: I  TM Distance: >3 FB Neck ROM: Full    Dental  (+) Poor Dentition, Missing, Chipped, Dental Advisory Given   Pulmonary COPD, former smoker (quit 1999),  Squamous cell RML lung   breath sounds clear to auscultation       Cardiovascular hypertension, Pt. on medications (-) angina Rhythm:Regular Rate:Normal     Neuro/Psych negative neurological ROS     GI/Hepatic negative GI ROS, Neg liver ROS,   Endo/Other  negative endocrine ROS  Renal/GU negative Renal ROS     Musculoskeletal   Abdominal   Peds  Hematology negative hematology ROS (+)   Anesthesia Other Findings   Reproductive/Obstetrics                            Anesthesia Physical Anesthesia Plan  ASA: III  Anesthesia Plan: General   Post-op Pain Management:    Induction: Intravenous  PONV Risk Score and Plan: 3 and Ondansetron, Dexamethasone and Treatment may vary due to age or medical condition  Airway Management Planned: Double Lumen EBT  Additional Equipment: Arterial line, CVP and Ultrasound Guidance Line Placement  Intra-op Plan:   Post-operative Plan: Extubation in OR  Informed Consent: I have reviewed the patients History and Physical, chart, labs and discussed the procedure including the risks, benefits and alternatives for the proposed anesthesia with the patient or authorized representative who has indicated his/her understanding and acceptance.   Dental advisory given  Plan Discussed with: CRNA and Surgeon  Anesthesia Plan Comments: (Plan routine monitors, A line, CVP, GETA with DLT)        Anesthesia Quick  Evaluation

## 2017-09-19 NOTE — Plan of Care (Signed)
  Problem: Education: Goal: Knowledge of General Education information will improve Outcome: Progressing   Problem: Health Behavior/Discharge Planning: Goal: Ability to manage health-related needs will improve Outcome: Progressing   Problem: Clinical Measurements: Goal: Ability to maintain clinical measurements within normal limits will improve Outcome: Progressing Goal: Will remain free from infection Outcome: Progressing Goal: Diagnostic test results will improve Outcome: Progressing Goal: Respiratory complications will improve Outcome: Progressing Goal: Cardiovascular complication will be avoided Outcome: Progressing   Problem: Activity: Goal: Risk for activity intolerance will decrease Outcome: Progressing   Problem: Nutrition: Goal: Adequate nutrition will be maintained Outcome: Progressing   Problem: Coping: Goal: Level of anxiety will decrease Outcome: Progressing   Problem: Elimination: Goal: Will not experience complications related to bowel motility Outcome: Progressing Goal: Will not experience complications related to urinary retention Outcome: Progressing   Problem: Pain Managment: Goal: General experience of comfort will improve Outcome: Progressing   Problem: Safety: Goal: Ability to remain free from injury will improve Outcome: Progressing   Problem: Skin Integrity: Goal: Risk for impaired skin integrity will decrease Outcome: Progressing   Problem: Spiritual Needs Goal: Ability to function at adequate level Outcome: Progressing   Problem: Education: Goal: Knowledge of disease or condition will improve Outcome: Progressing Goal: Knowledge of the prescribed therapeutic regimen will improve Outcome: Progressing   Problem: Activity: Goal: Risk for activity intolerance will decrease Outcome: Progressing   Problem: Cardiac: Goal: Hemodynamic stability will improve Outcome: Progressing   Problem: Clinical Measurements: Goal: Postoperative  complications will be avoided or minimized Outcome: Progressing   Problem: Respiratory: Goal: Respiratory status will improve Outcome: Progressing   Problem: Pain Management: Goal: Pain level will decrease Outcome: Progressing   Problem: Skin Integrity: Goal: Wound healing without signs and symptoms infection will improve Outcome: Progressing

## 2017-09-19 NOTE — Anesthesia Procedure Notes (Signed)
Arterial Line Insertion Start/End7/15/2019 10:45 AM Performed by: White, Amedeo Plenty, CRNA, CRNA  Lidocaine 1% used for infiltration Left, radial was placed Catheter size: 20 G Hand hygiene performed  and maximum sterile barriers used  Allen's test indicative of satisfactory collateral circulation Attempts: 1 Procedure performed without using ultrasound guided technique. Following insertion, dressing applied and Biopatch. Post procedure assessment: normal  Patient tolerated the procedure well with no immediate complications.

## 2017-09-19 NOTE — Anesthesia Procedure Notes (Signed)
Central Venous Catheter Insertion Performed by: Annye Asa, MD, anesthesiologist Start/End7/15/2019 11:33 AM, 09/19/2017 11:42 AM Patient location: Pre-op. Preanesthetic checklist: patient identified, IV checked, site marked, risks and benefits discussed, surgical consent, monitors and equipment checked, pre-op evaluation, timeout performed and anesthesia consent Position: supine Lidocaine 1% used for infiltration and patient sedated Hand hygiene performed , maximum sterile barriers used  and Seldinger technique used Catheter size: 8 Fr Central line was placed.Double lumen Procedure performed using ultrasound guided technique. Ultrasound Notes:anatomy identified, needle tip was noted to be adjacent to the nerve/plexus identified, no ultrasound evidence of intravascular and/or intraneural injection and image(s) printed for medical record Attempts: 1 Following insertion, line sutured, dressing applied and Biopatch. Post procedure assessment: blood return through all ports, free fluid flow and no air  Patient tolerated the procedure well with no immediate complications. Additional procedure comments: CVP: Timeout, sterile prep, drape, FBP R neck.  Supine position.  1% lido local, finder and trocar RIJ 1st pass with US guidance.  2 lumen placed over J wire. Biopatch and sterile dressing on.  Patient tolerated well.  VSS.  Jenita Seashore, MD.

## 2017-09-19 NOTE — Brief Op Note (Signed)
09/19/2017  7:06 PM  PATIENT:  Danny Duke  82 y.o. male  PRE-OPERATIVE DIAGNOSIS:  Lung Cancer RML Lung Nodule  POST-OPERATIVE DIAGNOSIS:  Lung Cancer RML Lung Nodule  PROCEDURE:  Procedure(s):  MEDIASTINOSCOPY (N/A) RIGHT VIDEO ASSISTED THORACOSCOPY  -Right Middle Lobectomy -Lymph Node Sampling -Chest wall Sampling -Intercostal Nerve Block with Exparel  SURGEON:  Surgeon(s) and Role:    * [name], MD - Primary  PHYSICIAN ASSISTANT: Jadene Pierini PA-C, Hibo Blasdell PA-C  ANESTHESIA:   general  EBL:  600 mL   BLOOD ADMINISTERED:none  DRAINS: 32 Blake, 28 Straight Chest Tube   LOCAL MEDICATIONS USED:  BUPIVICAINE   SPECIMEN:  Source of Specimen:  Right Middle Lobe, Lymph Nodes, Chest Wall  DISPOSITION OF SPECIMEN:  PATHOLOGY  COUNTS:  YES  TOURNIQUET:  * No tourniquets in log *  DICTATION: .Dragon Dictation  PLAN OF CARE: Admit to inpatient   PATIENT DISPOSITION:  ICU - extubated and stable.   Delay start of Pharmacological VTE agent (>24hrs) due to surgical blood loss or risk of bleeding: no

## 2017-09-19 NOTE — Anesthesia Postprocedure Evaluation (Signed)
Anesthesia Post Note  Patient: RITA PROM  Procedure(s) Performed: MEDIASTINOSCOPY (N/A ) RIGHT VIDEO ASSISTED THORACOSCOPY (VATS)/ LOBECTOMY (Right Chest)     Patient location during evaluation: PACU Anesthesia Type: General Level of consciousness: awake and alert Pain management: pain level controlled Vital Signs Assessment: post-procedure vital signs reviewed and stable Respiratory status: spontaneous breathing, nonlabored ventilation, respiratory function stable and patient connected to nasal cannula oxygen Cardiovascular status: blood pressure returned to baseline and stable Postop Assessment: no apparent nausea or vomiting Anesthetic complications: no    Last Vitals:  Vitals:   09/19/17 2100 09/19/17 2106  BP:    Pulse: 95   Resp: 17 12  Temp:    SpO2: 96% 96%    Last Pain:  Vitals:   09/19/17 2106  TempSrc:   PainSc: 2                  Effie Berkshire

## 2017-09-19 NOTE — Progress Notes (Signed)
Pt arrived to 2H15 without event at 20:55. Family visiting at 21:15, educated on visitation guidelines. Will continue to monitor.

## 2017-09-20 ENCOUNTER — Encounter (HOSPITAL_COMMUNITY): Payer: Self-pay | Admitting: Thoracic Surgery (Cardiothoracic Vascular Surgery)

## 2017-09-20 ENCOUNTER — Inpatient Hospital Stay (HOSPITAL_COMMUNITY): Payer: Medicare Other

## 2017-09-20 LAB — CBC
HCT: 34.8 % — ABNORMAL LOW (ref 39.0–52.0)
Hemoglobin: 11.4 g/dL — ABNORMAL LOW (ref 13.0–17.0)
MCH: 31.4 pg (ref 26.0–34.0)
MCHC: 32.8 g/dL (ref 30.0–36.0)
MCV: 95.9 fL (ref 78.0–100.0)
PLATELETS: 128 10*3/uL — AB (ref 150–400)
RBC: 3.63 MIL/uL — ABNORMAL LOW (ref 4.22–5.81)
RDW: 13 % (ref 11.5–15.5)
WBC: 9.7 10*3/uL (ref 4.0–10.5)

## 2017-09-20 LAB — BASIC METABOLIC PANEL
ANION GAP: 7 (ref 5–15)
BUN: 12 mg/dL (ref 8–23)
CALCIUM: 8.8 mg/dL — AB (ref 8.9–10.3)
CHLORIDE: 111 mmol/L (ref 98–111)
CO2: 22 mmol/L (ref 22–32)
Creatinine, Ser: 1.2 mg/dL (ref 0.61–1.24)
GFR calc non Af Amer: 55 mL/min — ABNORMAL LOW (ref >60)
GLUCOSE: 182 mg/dL — AB (ref 70–99)
Potassium: 4.6 mmol/L (ref 3.5–5.1)
Sodium: 140 mmol/L (ref 135–145)

## 2017-09-20 LAB — BLOOD GAS, ARTERIAL
Acid-base deficit: 3.7 mmol/L — ABNORMAL HIGH (ref 0.0–2.0)
Bicarbonate: 20.1 mmol/L (ref 20.0–28.0)
Drawn by: 51191
O2 CONTENT: 1 L/min
O2 SAT: 97.2 %
PCO2 ART: 32.4 mmHg (ref 32.0–48.0)
PH ART: 7.41 (ref 7.350–7.450)
Patient temperature: 98.6
pO2, Arterial: 92.6 mmHg (ref 83.0–108.0)

## 2017-09-20 LAB — GLUCOSE, CAPILLARY
GLUCOSE-CAPILLARY: 133 mg/dL — AB (ref 70–99)
Glucose-Capillary: 124 mg/dL — ABNORMAL HIGH (ref 70–99)
Glucose-Capillary: 110 mg/dL — ABNORMAL HIGH (ref 70–99)

## 2017-09-20 MED ORDER — LEVALBUTEROL HCL 0.63 MG/3ML IN NEBU: 0.6300 mg | INHALATION_SOLUTION | Freq: Four times a day (QID) | RESPIRATORY_TRACT | Status: DC | PRN

## 2017-09-20 MED ORDER — OXYCODONE HCL 5 MG PO TABS
20.0000 mg | ORAL_TABLET | Freq: Four times a day (QID) | ORAL | Status: DC | PRN
  Administered 2017-09-20 – 2017-09-26 (×18): 20 mg via ORAL
  Filled 2017-09-20 (×18): qty 4

## 2017-09-20 MED ORDER — INSULIN ASPART 100 UNIT/ML ~~LOC~~ SOLN
0.0000 [IU] | Freq: Three times a day (TID) | SUBCUTANEOUS | Status: DC
  Administered 2017-09-20 – 2017-09-24 (×4): 1 [IU] via SUBCUTANEOUS

## 2017-09-20 MED ORDER — ASPIRIN EC 81 MG PO TBEC
81.0000 mg | DELAYED_RELEASE_TABLET | Freq: Every day | ORAL | Status: DC
  Administered 2017-09-20 – 2017-09-26 (×7): 81 mg via ORAL
  Filled 2017-09-20 (×7): qty 1

## 2017-09-20 MED ORDER — MECLIZINE HCL 12.5 MG PO TABS
12.5000 mg | ORAL_TABLET | Freq: Three times a day (TID) | ORAL | Status: DC | PRN
  Filled 2017-09-20: qty 1

## 2017-09-20 MED ORDER — ENSURE ENLIVE PO LIQD
237.0000 mL | Freq: Two times a day (BID) | ORAL | Status: DC
  Administered 2017-09-20 – 2017-09-25 (×5): 237 mL via ORAL

## 2017-09-20 MED ORDER — ENOXAPARIN SODIUM 40 MG/0.4ML ~~LOC~~ SOLN
40.0000 mg | SUBCUTANEOUS | Status: DC
  Administered 2017-09-20 – 2017-09-26 (×7): 40 mg via SUBCUTANEOUS
  Filled 2017-09-20 (×7): qty 0.4

## 2017-09-20 NOTE — Progress Notes (Signed)
Initial Nutrition Assessment  DOCUMENTATION CODES:   Not applicable  INTERVENTION:    Ensure Enlive po BID, each supplement provides 350 kcal and 20 grams of protein  NUTRITION DIAGNOSIS:   Increased nutrient needs related to catabolic illness, post-op healing as evidenced by estimated needs  GOAL:   Patient will meet greater than or equal to 90% of their needs  MONITOR:   PO intake, Supplement acceptance, Labs, Weight trends, Skin, I & O's  REASON FOR ASSESSMENT:   Malnutrition Screening Tool  ASSESSMENT:   82 yo Male with PMH of history of HTN, HLD, chronic back pain, arthritis, and remote tobacco abuse; admitted for surgical intervention of R lobe mass.   Pt s/p procedures 7/15: RIGHT VIDEO ASSISTED THORACOSCOPY (VATS) LOBECTOMY   RD spoke with pt's wife at bedside. Pt is sitting in his recliner. His wife reports he ate good this AM. Had eggs, hashbrowns and coffee. She reveals pt was experiencing a decreased appetite PTA. He was anxious for surgery.  Wife also shares pt has lost about 8 lbs. Time frame unknown. His UBW is 160 lbs. Feel pt would benefit from addition of nutrition supplements. He is amenable. Labs and mediations reviewed. On IV Reglan.  NUTRITION - FOCUSED PHYSICAL EXAM:  Deferred at this time.  Diet Order:   Diet Order           Diet heart healthy/carb modified Room service appropriate? Yes; Fluid consistency: Thin  Diet effective now         EDUCATION NEEDS:   No education needs have been identified at this time  Skin:  Skin Assessment: Reviewed RN Assessment  Last BM:  7/16  Height:   Ht Readings from Last 1 Encounters:  09/19/17 5\' 8"  (1.727 m)   Weight:   Wt Readings from Last 1 Encounters:  09/19/17 157 lb 13.6 oz (71.6 kg)   Ideal Body Weight:  70 kg  BMI:  Body mass index is 24 kg/m.  Estimated Nutritional Needs:   Kcal:  1800-2000  Protein:  90-105 gm  Fluid:  1.8-2.0 L  Arthur Holms, RD, LDN Pager #:  [phone] After-Hours Pager #: [phone]

## 2017-09-20 NOTE — Care Management Note (Signed)
Case Management Note Marvetta Gibbons RN,BSN Unit [facility] 1-22 Case Manager  [phone]  Patient Details  Name: Danny Duke MRN: [id number] Date of Birth: April 13, 1935  Subjective/Objective:   Pt admitted s/p MEDIASTINOSCOPY (N/A) RIGHT VIDEO ASSISTED THORACOSCOPY (VATS)/ LOBECTOMY (Right)                Action/Plan: PTA pt lived at home with spouse, anticipate return home, CM to follow for transition of care needs  Expected Discharge Date:                  Expected Discharge Plan:  Home/Self Care  In-House Referral:     Discharge planning Services  CM Consult  Post Acute Care Choice:    Choice offered to:     DME Arranged:    DME Agency:     HH Arranged:    [place] Agency:     Status of Service:  In process, will continue to follow  If discussed at Long Length of Stay Meetings, dates discussed:    Discharge Disposition:   Additional Comments:  Dawayne Patricia, RN 09/20/2017, 11:18 AM

## 2017-09-20 NOTE — Progress Notes (Signed)
1 Day Post-Op Procedure(s) (LRB): MEDIASTINOSCOPY (N/A) RIGHT VIDEO ASSISTED THORACOSCOPY (VATS)/ LOBECTOMY (Right) Subjective: Some incisional pain  Objective: Vital signs in last 24 hours: Temp:  [97.5 F (36.4 C)-98.8 F (37.1 C)] 98.2 F (36.8 C) (07/16 0720) Pulse Rate:  [75-98] 85 (07/16 0700) Cardiac Rhythm: Normal sinus rhythm (07/16 0400) Resp:  [11-25] 20 (07/16 0700) BP: (91-148)/(65-95) 110/71 (07/16 0700) SpO2:  [93 %-100 %] 93 % (07/16 0700) Arterial Line BP: (119-166)/(53-74) 127/63 (07/16 0700) Weight:  [157 lb 13.6 oz (71.6 kg)-161 lb (73 kg)] 157 lb 13.6 oz (71.6 kg) (07/15 2100)  Hemodynamic parameters for last 24 hours:    Intake/Output from previous day: 07/15 0701 - 07/16 0700 In: 3426.5 [P.O.:30; I.V.:3036.4; IV Piggyback:350.1] Out: 2175 [Urine:1185; Blood:600; Chest Tube:390] Intake/Output this shift: No intake/output data recorded.  General appearance: alert, cooperative and no distress Neurologic: intact Heart: regular rate and rhythm Lungs: diminished breath sounds right base Abdomen: normal findings: soft, non-tender + air leak  Lab Results: Recent Labs    09/19/17 1015  09/19/17 1852 09/20/17 0332  WBC 7.5  --   --  9.7  HGB 14.1   < > 10.9* 11.4*  HCT 44.6   < > 32.0* 34.8*  PLT 153  --   --  128*   < > = values in this interval not displayed.   BMET:  Recent Labs    09/19/17 1015  09/19/17 1852 09/20/17 0332  NA 141   < > 139 140  K 3.5   < > 4.0 4.6  CL 110  --   --  111  CO2 22  --   --  22  GLUCOSE 97  --   --  182*  BUN 10  --   --  12  CREATININE 1.23  --   --  1.20  CALCIUM 9.3  --   --  8.8*   < > = values in this interval not displayed.    PT/INR: No results for input(s): LABPROT, INR in the last 72 hours. ABG    Component Value Date/Time   PHART 7.410 09/20/2017 0300   HCO3 20.1 09/20/2017 0300   TCO2 30 09/19/2017 1852   ACIDBASEDEF 3.7 (H) 09/20/2017 0300   O2SAT 97.2 09/20/2017 0300   CBG (last 3)   No results for input(s): GLUCAP in the last 72 hours.  Assessment/Plan: S/P Procedure(s) (LRB): MEDIASTINOSCOPY (N/A) RIGHT VIDEO ASSISTED THORACOSCOPY (VATS)/ LOBECTOMY (Right) Plan for transfer to step-down: see transfer orders  Looks good this AM CV- stable- dc A line  RESP- no distress, on minimal O2, ABG OK  IS, xopenex  Air leak- CXR look better this AM, keep CT to suction RENAL- creatinine below baseline, keep Foley today  ENDO- CBG elevated this AM- will order CBG AC and HS  Anemia secondary to ABL- mild, follow  Thrombocytopenia- mild, follow  SCD, enoxaparin + ambulation for DVT prophylaxis   LOS: 1 day    Danny Duke 09/20/2017

## 2017-09-20 NOTE — Progress Notes (Signed)
CT surgery p.m. Rounds  Patient had a good day ambulating in hall O2 sat satisfactory on room air Pain well controlled Moderate persistent postop air leak Continue postop care

## 2017-09-21 ENCOUNTER — Inpatient Hospital Stay (HOSPITAL_COMMUNITY): Payer: Medicare Other

## 2017-09-21 ENCOUNTER — Other Ambulatory Visit: Payer: Self-pay

## 2017-09-21 LAB — GLUCOSE, CAPILLARY
GLUCOSE-CAPILLARY: 139 mg/dL — AB (ref 70–99)
Glucose-Capillary: 108 mg/dL — ABNORMAL HIGH (ref 70–99)
Glucose-Capillary: 138 mg/dL — ABNORMAL HIGH (ref 70–99)
Glucose-Capillary: 141 mg/dL — ABNORMAL HIGH (ref 70–99)

## 2017-09-21 LAB — CBC
HCT: 33.5 % — ABNORMAL LOW (ref 39.0–52.0)
Hemoglobin: 11 g/dL — ABNORMAL LOW (ref 13.0–17.0)
MCH: 31.7 pg (ref 26.0–34.0)
MCHC: 32.8 g/dL (ref 30.0–36.0)
MCV: 96.5 fL (ref 78.0–100.0)
PLATELETS: 120 10*3/uL — AB (ref 150–400)
RBC: 3.47 MIL/uL — ABNORMAL LOW (ref 4.22–5.81)
RDW: 13.2 % (ref 11.5–15.5)
WBC: 12.3 10*3/uL — ABNORMAL HIGH (ref 4.0–10.5)

## 2017-09-21 LAB — COMPREHENSIVE METABOLIC PANEL
ALT: 11 U/L (ref 0–44)
ANION GAP: 6 (ref 5–15)
AST: 25 U/L (ref 15–41)
Albumin: 2.6 g/dL — ABNORMAL LOW (ref 3.5–5.0)
Alkaline Phosphatase: 46 U/L (ref 38–126)
BUN: 20 mg/dL (ref 8–23)
CO2: 23 mmol/L (ref 22–32)
CREATININE: 1.6 mg/dL — AB (ref 0.61–1.24)
Calcium: 8.5 mg/dL — ABNORMAL LOW (ref 8.9–10.3)
Chloride: 106 mmol/L (ref 98–111)
GFR, EST AFRICAN AMERICAN: 45 mL/min — AB (ref >60)
GFR, EST NON AFRICAN AMERICAN: 39 mL/min — AB (ref >60)
Glucose, Bld: 121 mg/dL — ABNORMAL HIGH (ref 70–99)
Potassium: 3.8 mmol/L (ref 3.5–5.1)
SODIUM: 135 mmol/L (ref 135–145)
Total Bilirubin: 0.8 mg/dL (ref 0.3–1.2)
Total Protein: 6.2 g/dL — ABNORMAL LOW (ref 6.5–8.1)

## 2017-09-21 MED ORDER — AMIODARONE HCL IN DEXTROSE 360-4.14 MG/200ML-% IV SOLN
60.0000 mg/h | INTRAVENOUS | Status: AC
  Administered 2017-09-21 (×2): 60 mg/h via INTRAVENOUS
  Filled 2017-09-21: qty 200

## 2017-09-21 MED ORDER — AMIODARONE HCL IN DEXTROSE 360-4.14 MG/200ML-% IV SOLN
30.0000 mg/h | INTRAVENOUS | Status: AC
Start: 2017-09-21 — End: 2017-09-22
  Administered 2017-09-21: 30 mg/h via INTRAVENOUS
  Filled 2017-09-21 (×2): qty 200

## 2017-09-21 MED ORDER — AMIODARONE LOAD VIA INFUSION
150.0000 mg | Freq: Once | INTRAVENOUS | Status: AC
  Administered 2017-09-21: 150 mg via INTRAVENOUS
  Filled 2017-09-21: qty 83.34

## 2017-09-21 NOTE — Progress Notes (Signed)
2 Days Post-Op Procedure(s) (LRB): MEDIASTINOSCOPY (N/A) RIGHT VIDEO ASSISTED THORACOSCOPY (VATS)/ LOBECTOMY (Right) Subjective: No complaints this AM Denies pain and nausea  Objective: Vital signs in last 24 hours: Temp:  [97.5 F (36.4 C)-98.2 F (36.8 C)] 98 F (36.7 C) (07/17 0700) Pulse Rate:  [79-111] 101 (07/17 0800) Cardiac Rhythm: Sinus tachycardia (07/17 0800) Resp:  [11-28] 25 (07/17 0800) BP: (88-135)/(58-87) 111/86 (07/17 0800) SpO2:  [90 %-97 %] 93 % (07/17 0800) Weight:  [146 lb 13.2 oz (66.6 kg)] 146 lb 13.2 oz (66.6 kg) (07/17 0600)  Hemodynamic parameters for last 24 hours:    Intake/Output from previous day: 07/16 0701 - 07/17 0700 In: 1237.6 [P.O.:790; I.V.:347.6; IV Piggyback:100] Out: 2003 [Urine:1065; Chest Tube:440] Intake/Output this shift: Total I/O In: 130 [P.O.:120; I.V.:10] Out: 130 [Urine:80; Chest Tube:50]  General appearance: alert, cooperative and no distress Neurologic: intact Heart: tachy, regular Lungs: diminished breath sounds right base + air leak  Lab Results: Recent Labs    09/20/17 0332 09/21/17 0340  WBC 9.7 12.3*  HGB 11.4* 11.0*  HCT 34.8* 33.5*  PLT 128* 120*   BMET:  Recent Labs    09/20/17 0332 09/21/17 0340  NA 140 135  K 4.6 3.8  CL 111 106  CO2 22 23  GLUCOSE 182* 121*  BUN 12 20  CREATININE 1.20 1.60*  CALCIUM 8.8* 8.5*    PT/INR: No results for input(s): LABPROT, INR in the last 72 hours. ABG    Component Value Date/Time   PHART 7.410 09/20/2017 0300   HCO3 20.1 09/20/2017 0300   TCO2 30 09/19/2017 1852   ACIDBASEDEF 3.7 (H) 09/20/2017 0300   O2SAT 97.2 09/20/2017 0300   CBG (last 3)  Recent Labs    09/20/17 1153 09/20/17 1611 09/20/17 2141  GLUCAP 124* 110* 133*    Assessment/Plan: S/P Procedure(s) (LRB): MEDIASTINOSCOPY (N/A) RIGHT VIDEO ASSISTED THORACOSCOPY (VATS)/ LOBECTOMY (Right) -CV- in sinus tachycardia, BP Ok  RESP- increased SQ air on CXR. Air leak unchanged to  slightly smaller  Will keep CT to suction   Continue IS, nebs  RENAL- creatinine elevated- will stop lisinopril  ENDO- CBG well controlled  SCD + enoxaparin for DVT prophylaxis  DC central line and Foley   LOS: 2 days    [name] 09/21/2017

## 2017-09-21 NOTE — Progress Notes (Signed)
Patient went into Afib RVR 140s-150s. 12 lead EKG obtained. Dr. Roxan Hockey paged and verbal order received to start Amiodarone infusion with loading dose. Education provided to patient and his wife. Will implement orders and continue to monitor patient closely.

## 2017-09-21 NOTE — Plan of Care (Signed)
  Problem: Education: Goal: Knowledge of General Education information will improve Outcome: Progressing   Problem: Health Behavior/Discharge Planning: Goal: Ability to manage health-related needs will improve Outcome: Progressing   Problem: Clinical Measurements: Goal: Ability to maintain clinical measurements within normal limits will improve Outcome: Progressing Goal: Will remain free from infection Outcome: Progressing Goal: Diagnostic test results will improve Outcome: Progressing Goal: Respiratory complications will improve Outcome: Progressing Goal: Cardiovascular complication will be avoided Outcome: Progressing   Problem: Activity: Goal: Risk for activity intolerance will decrease Outcome: Progressing   Problem: Elimination: Goal: Will not experience complications related to bowel motility Outcome: Progressing Goal: Will not experience complications related to urinary retention Outcome: Progressing   Problem: Coping: Goal: Level of anxiety will decrease Outcome: Progressing   Problem: Nutrition: Goal: Adequate nutrition will be maintained Outcome: Progressing   Problem: Activity: Goal: Risk for activity intolerance will decrease Outcome: Progressing

## 2017-09-21 NOTE — Progress Notes (Signed)
Patient ID: Danny Duke, male   DOB: [birth year], 82 y.o.   MRN: [id number] EVENING ROUNDS NOTE :     [address]       [place] [id number]             [phone]                 2 Days Post-Op Procedure(s) (LRB): MEDIASTINOSCOPY (N/A) RIGHT VIDEO ASSISTED THORACOSCOPY (VATS)/ LOBECTOMY (Right)  Total Length of Stay:  LOS: 2 days  BP 130/90   Pulse (!) 111   Temp 99.6 F (37.6 C)   Resp (!) 26   Ht 5\' 8"  (1.727 m)   Wt 146 lb 13.2 oz (66.6 kg)   SpO2 96%   BMI 22.32 kg/m   .Intake/Output      07/16 0701 - 07/17 0700 07/17 0701 - 07/18 0700   P.O. 790 360   I.V. (mL/kg) 347.6 (5.2) 146.6 (2.2)   Other     IV Piggyback 100    Total Intake(mL/kg) 1237.6 (18.6) 506.6 (7.6)   Urine (mL/kg/hr) 1065 (0.7) 355 (0.5)   Stool 0 0   Blood     Chest Tube 440 50   Total Output 1505 405   Net -267.5 +101.6        Stool Occurrence 1 x 1 x     . sodium chloride 10 mL/hr at 09/21/17 1500  . amiodarone 60 mg/hr (09/21/17 1619)   Followed by  . amiodarone    . potassium chloride       Lab Results  Component Value Date   WBC 12.3 (H) 09/21/2017   HGB 11.0 (L) 09/21/2017   HCT 33.5 (L) 09/21/2017   PLT 120 (L) 09/21/2017   GLUCOSE 121 (H) 09/21/2017   ALT 11 09/21/2017   AST 25 09/21/2017   NA 135 09/21/2017   K 3.8 09/21/2017   CL 106 09/21/2017   CREATININE 1.60 (H) 09/21/2017   BUN 20 09/21/2017   CO2 23 09/21/2017   INR 1.10 09/07/2017   started on Cordarone this am for a fib     Grace Isaac MD  [place] Office [phone] 09/21/2017 5:59 PM

## 2017-09-22 ENCOUNTER — Inpatient Hospital Stay (HOSPITAL_COMMUNITY): Payer: Medicare Other

## 2017-09-22 LAB — BASIC METABOLIC PANEL
ANION GAP: 10 (ref 5–15)
BUN: 22 mg/dL (ref 8–23)
CHLORIDE: 101 mmol/L (ref 98–111)
CO2: 24 mmol/L (ref 22–32)
Calcium: 9.1 mg/dL (ref 8.9–10.3)
Creatinine, Ser: 1.63 mg/dL — ABNORMAL HIGH (ref 0.61–1.24)
GFR calc Af Amer: 44 mL/min — ABNORMAL LOW (ref >60)
GFR, EST NON AFRICAN AMERICAN: 38 mL/min — AB (ref >60)
Glucose, Bld: 127 mg/dL — ABNORMAL HIGH (ref 70–99)
POTASSIUM: 3.5 mmol/L (ref 3.5–5.1)
SODIUM: 135 mmol/L (ref 135–145)

## 2017-09-22 LAB — GLUCOSE, CAPILLARY
GLUCOSE-CAPILLARY: 112 mg/dL — AB (ref 70–99)
Glucose-Capillary: 115 mg/dL — ABNORMAL HIGH (ref 70–99)
Glucose-Capillary: 111 mg/dL — ABNORMAL HIGH (ref 70–99)
Glucose-Capillary: 104 mg/dL — ABNORMAL HIGH (ref 70–99)

## 2017-09-22 MED ORDER — POTASSIUM CHLORIDE 10 MEQ/100ML IV SOLN
10.0000 meq | INTRAVENOUS | Status: AC
  Administered 2017-09-22 (×3): 10 meq via INTRAVENOUS
  Filled 2017-09-22 (×3): qty 100

## 2017-09-22 MED ORDER — POTASSIUM CHLORIDE 10 MEQ/50ML IV SOLN: 10.0000 meq | INTRAVENOUS | Status: DC

## 2017-09-22 MED ORDER — AMIODARONE HCL 200 MG PO TABS
400.0000 mg | ORAL_TABLET | Freq: Two times a day (BID) | ORAL | Status: DC
  Administered 2017-09-22 – 2017-09-26 (×9): 400 mg via ORAL
  Filled 2017-09-22 (×10): qty 2

## 2017-09-22 MED ORDER — METOPROLOL SUCCINATE ER 25 MG PO TB24
25.0000 mg | ORAL_TABLET | Freq: Every day | ORAL | Status: DC
  Administered 2017-09-22 – 2017-09-26 (×5): 25 mg via ORAL
  Filled 2017-09-22 (×5): qty 1

## 2017-09-22 NOTE — Op Note (Signed)
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  POSTOPERATIVE DIAGNOSIS:  Nonsmall cell cancer, right middle lobe, clinical stage IB (T2, N0).    PROCEDURE:   Mediastinoscopy, Right video-assisted thoracoscopy, Right middle lobectomy (extrapleural, Mediastinal lymph node sampling, Intercostal nerve block.  SURGEON:  Modesto Charon, MD  ASSISTANT:  Jadene Pierini, PA-C and Ellwood Handler, PA-C.  ANESTHESIA:  General.  FINDINGS:   Mediastinoscopy- enlarged, but otherwise benign appearing lymph nodes, no tumor seen on frozen section.   Right middle lobe mass adherent to the chest wall anteriorly, resected in extrapleural plane.  Deep margins negative for tumor.  CLINICAL NOTE:  [name] is an 82 year old man with a history of tobacco abuse, although he quit 25 years ago.  He also has emphysema.  He recently went for an annual physical and a chest x-ray showed a right middle lobe opacity.  CT of the chest  showed a 3 x 2.9 x 2.8 cm spiculated mass in the right middle lobe.  There also were calcified granulomas in the right upper lobe and a noncalcified nodule in the left upper lobe.  On PET CT of the right middle lobe nodule was markedly hypermetabolic with  an SUV of 20.  There also were hypermetabolic hilar and mediastinal lymph nodes.  He underwent bronchoscopy and endobronchial ultrasound with Dr. Lake Bells.  The lymph nodes were negative.  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  He was advised to undergo mediastinoscopy for confirmation of the mediastinal nodes followed by right VATS for right middle lobectomy, should the mediastinal lymph nodes be negative.  The indications, risks,  benefits, and alternatives  were discussed in detail with the patient.  He understood and accepted the risks and agreed to proceed.  DESCRIPTION OF PROCEDURE:  The patient was brought to the preoperative holding area on 09/19/2017.  Anesthesia placed an arterial blood pressure monitoring line and a central venous catheter.  He was taken to the Operating Room, anesthetized and intubated.   Intravenous antibiotics were administered.  A Foley catheter was placed.  Sequential compression devices were placed on the calves for DVT prophylaxis.  A timeout was performed.  A transverse incision was made one fingerbreadth above the sternal notch.  It was carried through the skin and subcutaneous tissue.  The strap muscles were separated in the midline.  The pretracheal fascia was incised.   Pretracheal plane was developed bluntly into the mediastinum using a fingertip.  The mediastinoscope was inserted and systematic inspection of the mediastinal lymph node stations was carried out.  There were multiple enlarged, but otherwise benign  appearing lymph nodes that were sampled.  These were sent for frozen section.  The frozens returned with no tumor seen.  After the sampling the nodes, there was some bleeding.  The wound was packed with gauze.  After 5 minutes, the packing was removed.  The  mediastinoscope was inserted.  There was no ongoing bleeding.  The mediastinoscope was removed.  The incision was closed in 2 layers.  Dermabond was applied.  By this point, the frozen section results had returned and the decision was made to proceed  with the right VATS as discussed with the patient preoperatively.  He was reintubated with a double lumen endotracheal tube.  He was  placed in a left lateral decubitus position and the right chest was prepped and draped in the usual sterile fashion.  Single lung ventilation of the left lung was initiated and was  tolerated well throughout the procedure.  A solution containing  20 mL of liposomal bupivacaine, 30 mL of 0.5% bupivacaine and 50 mL of saline was used for local anesthesia and nerve blocks.  The solution was injected at the site for the port incision.  The incision was made.  A 5 mm port was  inserted.  The thoracoscope was advanced into the chest.  There was good isolation of the right lung.  The mass was visible adherent to the anterior chest wall.  Posteriorly, there was a thick fibrous appearing pleural plaque.  There was a small pleural  effusion.  The area for the incision was anesthetized with the bupivicaine solution and a 5 cm working incision was made.  This was later extended to approximately 10 cm in length.  No rib spreading was performed during the procedure.  Intercostal nerve blocks were performed  from the third to the tenth interspace.  10 mL of the liposomal bupivacaine solution was injected into each interspace in a subpleural plane.  The pleural effusion was evacuated and sent for cytology.  The inferior ligament was mobilized.  There was edema and some fibrosis in this area.  There was a small lymph node.  Tissue in this area was removed and sent for frozen section.  Both that and  the pleural plaque came back benign with no tumor seen.  Decision was made to proceed with the middle lobectomy.  The pleural reflection was divided at the hilum posteriorly.  Large level 7 nodes were removed.  All lymph nodes that were removed were sent  for permanent pathology.  All were relatively enlarged and had a fibrotic reaction around them, but all were anthracotic and otherwise benign appearing.  There also was relatively fibrotic tissue around the blood vessels and the airways.  The dissection  was limited anteriorly due to the adhesions to the anterior chest wall.  Ultimately, the decision was made to divide the right middle lobe and complete the hilar dissection and the chest wall dissection separately.  This was done with sequential  firings of an Echelon  stapler.  There was then good exposure and the hilar dissection was initiated.  The middle lobe vein was identified.  It was encircled. This was a slow and tedious process due to the fibrotic tissue around the vein. The vein was  encircled and divided with a vascular stapler.  The minor fissure was completed with the Echelon stapler as was the major fissure,  the right middle lobe bronchus was dissected out, encircled and divided with the Echelon stapler using a green  cartridge.  The stapler was initially placed across the bronchus and closed.  A test inflation showed good aeration of the upper and lower lobes.  The stapler was fired transecting the bronchus.  The right middle lobe arterial branches then were divided  individually with the endoscopic stapler.  The specimen was sent for frozen section of the bronchial margin, which later returned with no tumor seen.    Attention then was turned to the anterior chest wall.  Because of dense adhesions in this area, the plan was to do an extrapleural resection and possible chest wall resection as necessary.  The extrapleural plane was developed through relatively  thickened pleural plaque.  The plane then developed relatively easily.  Harmonic scalpel was used to complete the dissection.  A 2 cm rim of the parietal pleura was taken en bloc with the specimen.  There was no evidence of involvement of the muscles or  the ribs.  The specimen was placed into an endoscopic retrieval bag, removed and sent for frozen section.  The anterior, posterior and deep margins were marked for the frozen sections.  Additional nodes were harvested.  The chest was copiously  irrigated with warm saline.  A test inflation showed some air leakage from the right upper lobe and the confluence of the major and minor fissures.  Progel was applied to this area.  The margins came back with some atypical cells at the anterior margin.   Additional tissue was resected from this area and sent  for permanent pathology.  A 28 French chest tube was placed through the original port incision and a 32 [place] Blake drain was placed through a separate port incision.  These were secured to the  skin with #1 silk sutures.  The upper and lower lobes then were reinflated.  The working incision was closed in 3 layers in the standard fashion.  All sponge, needle and instrument counts were correct at the end of the procedure.  The chest tubes were  placed to suction.  The patient was placed back in supine position.    He was extubated in the operating room and taken to the [facility] in good condition.  AN/NUANCE  D:09/22/2017 T:09/22/2017 JOB:[id number]

## 2017-09-22 NOTE — Progress Notes (Signed)
Responded to scc to assist with AD. Nurse will give pt AD and pt and family will review and get back with chaplain when ready to move forward.  Jaclynn Major, [place], [facility], Pager [phone]

## 2017-09-22 NOTE — Plan of Care (Signed)
Continue current care plan 

## 2017-09-22 NOTE — Progress Notes (Signed)
3 Days Post-Op Procedure(s) (LRB): MEDIASTINOSCOPY (N/A) RIGHT VIDEO ASSISTED THORACOSCOPY (VATS)/ LOBECTOMY (Right) Subjective: No complaints this AM. Denies nausea. Using PCA frequently  Objective: Vital signs in last 24 hours: Temp:  [98.4 F (36.9 C)-99.6 F (37.6 C)] 98.4 F (36.9 C) (07/18 0737) Pulse Rate:  [65-154] 142 (07/18 0737) Cardiac Rhythm: Normal sinus rhythm (07/18 0700) Resp:  [11-30] 21 (07/18 0349) BP: (105-160)/(64-116) 122/67 (07/18 0737) SpO2:  [64 %-100 %] 100 % (07/18 0737)  Hemodynamic parameters for last 24 hours:    Intake/Output from previous day: 07/17 0701 - 07/18 0700 In: 714 [P.O.:360; I.V.:354] Out: 900 [Urine:730; Chest Tube:170] Intake/Output this shift: No intake/output data recorded.  General appearance: alert, cooperative and no distress Neurologic: intact Heart: irregularly irregular rhythm Lungs: diminished breath sounds bibasilar Wound: clean and dry no air leak  Lab Results: Recent Labs    09/20/17 0332 09/21/17 0340  WBC 9.7 12.3*  HGB 11.4* 11.0*  HCT 34.8* 33.5*  PLT 128* 120*   BMET:  Recent Labs    09/21/17 0340 09/22/17 0148  NA 135 135  K 3.8 3.5  CL 106 101  CO2 23 24  GLUCOSE 121* 127*  BUN 20 22  CREATININE 1.60* 1.63*  CALCIUM 8.5* 9.1    PT/INR: No results for input(s): LABPROT, INR in the last 72 hours. ABG    Component Value Date/Time   PHART 7.410 09/20/2017 0300   HCO3 20.1 09/20/2017 0300   TCO2 30 09/19/2017 1852   ACIDBASEDEF 3.7 (H) 09/20/2017 0300   O2SAT 97.2 09/20/2017 0300   CBG (last 3)  Recent Labs    09/21/17 1621 09/21/17 2123 09/22/17 0737  GLUCAP 138* 141* 112*    Assessment/Plan: S/P Procedure(s) (LRB): MEDIASTINOSCOPY (N/A) RIGHT VIDEO ASSISTED THORACOSCOPY (VATS)/ LOBECTOMY (Right) -CV- went into atrial fib with RVR yesterday. Started on amiodarone drip  This AM in SR with PACs- will add Toprol 25 mg daily and change Amiodarone to PO  RESP- no air leak- CT  to water seal  Drainage trending down but will keep both tubes today  RENAL- creatinine stable after lisinopril stopped- stage III CKD  Supplement K  ENDO- CBG well controlled  SCD + enoxaparin for DVT prophylaxis  Continue ambulation   LOS: 3 days    [name] 09/22/2017

## 2017-09-23 ENCOUNTER — Inpatient Hospital Stay (HOSPITAL_COMMUNITY): Payer: Medicare Other

## 2017-09-23 LAB — BASIC METABOLIC PANEL
ANION GAP: 7 (ref 5–15)
BUN: 19 mg/dL (ref 8–23)
CALCIUM: 8.9 mg/dL (ref 8.9–10.3)
CO2: 23 mmol/L (ref 22–32)
Chloride: 107 mmol/L (ref 98–111)
Creatinine, Ser: 1.42 mg/dL — ABNORMAL HIGH (ref 0.61–1.24)
GFR, EST AFRICAN AMERICAN: 52 mL/min — AB (ref >60)
GFR, EST NON AFRICAN AMERICAN: 45 mL/min — AB (ref >60)
GLUCOSE: 112 mg/dL — AB (ref 70–99)
POTASSIUM: 4 mmol/L (ref 3.5–5.1)
Sodium: 137 mmol/L (ref 135–145)

## 2017-09-23 LAB — GLUCOSE, CAPILLARY
GLUCOSE-CAPILLARY: 138 mg/dL — AB (ref 70–99)
GLUCOSE-CAPILLARY: 90 mg/dL (ref 70–99)
Glucose-Capillary: 111 mg/dL — ABNORMAL HIGH (ref 70–99)
Glucose-Capillary: 111 mg/dL — ABNORMAL HIGH (ref 70–99)

## 2017-09-23 LAB — MAGNESIUM: MAGNESIUM: 2.1 mg/dL (ref 1.7–2.4)

## 2017-09-23 LAB — TSH: TSH: 1.188 u[IU]/mL (ref 0.350–4.500)

## 2017-09-23 NOTE — Discharge Summary (Addendum)
[address]       [place] [address]             [phone]      Physician Discharge Summary  Patient ID: Danny Duke MRN: [id number] DOB/AGE: May 19, 1935 82 y.o.  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 Status post surgery 09/19/2017  . Squamous cell carcinoma lung, right ([facility])   . COPD GOLD I  07/13/2017  . Essential hypertension 07/13/2017  . 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 Hiswife thinks he may have emphysema as well. He was in his usual state of health until recently when he went for anannual physical. A chest x-ray was done which showed a right middle lobe opacity. A CT of the chest showed a 3 x 2.9 x 2.8 cm spiculated opacity in the right middle lobe. There also were some calcified granulomas in the right upper lobe and a noncalcified nodule in the left upper lobe. On PET CT the nodule was markedly hypermetabolic with an SUV of 20. There were hypermetabolic right paratracheal, left paratracheal, subcarinal, and bilateral hilar lymph nodes. There was low-level activity ina small pleural effusion.  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 He underwent endobronchial ultrasound by Dr. Lake Bells on 08/05/2017. Aspirations of 4R and 7 lymph nodes were negative for tumor.  He says his appetite has been fair. He is lost about 5 pounds over the past 3 months. He has not had any cough or  hemoptysis. He has not noted any unusual shortness of breath or wheezing. He denies chest pain, pressure, or tightness at rest or with activity. He has not had any unusual headaches or visual changes.   Hospital Course:   On 09/19/2017 Danny Duke underwent a mediastinoscopy, right video-assisted thoracoscopy, and right middle lobectomy with Dr. Roxan Hockey.  He tolerated the procedure well and was transferred to the PACU.  He was extubated timely manner.  He was then transferred to the stepdown unit.  Postop day 1 he remained hemodynamically stable.  We discontinued his arterial line.  We initiated Xopenex for wheezing.  He did have a small air leak so we kept his chest tube to suction.  We initiated Lovenox for DVT prophylaxis.Postop day 2 he did have increased subcu air on chest x-ray.  We continued his chest tube to suction.  Due to his creatinine being elevated we stopped his lisinopril.  We discontinued his central line and Foley catheter.  That evening he went into atrial fibrillation therefore IV amiodarone was started per protocol.  Postop day 3 we changed his IV amiodarone to p.o.  We added metoprolol 25 mg daily for better heart rate and blood pressure control.  We change his chest tube to waterseal.  Postop day 4 he continued to progress.  We discontinued his anterior chest tube today but continued his posterior chest tube to waterseal.  All chest tubes have been removed at this point.  His creatinine  was much better after discontinuing the lisinopril.  We continue to avoid nephrotoxic medications.  His afib has stabilized and he is in sinus rhythm.  He is ambulating well without significant difficulty.  Incisions are healing well without evidence of infection.  Oxygen has been weaned and he maintains good saturations on room air.  Pain is well controlled.  At time of discharge the patient is felt to be quite stable.  Consults: nutrition  Significant Diagnostic Studies:   CLINICAL DATA:   Shortness of breath.  EXAM: PORTABLE CHEST 1 VIEW  COMPARISON:  Chest radiograph 09/21/2017.  FINDINGS: Monitoring leads overlie the patient. Right chest tube stable in position. Stable cardiac and mediastinal contours with tortuosity of the thoracic aorta. Grossly unchanged heterogeneous opacities in postsurgical change right lung. Small lucency right lung apex. No large pleural effusion. Subcutaneous emphysema overlying the right lateral chest wall.  IMPRESSION: Tiny lucency right lung apex is nonspecific. Small right apical pneumothorax not excluded versus gas within the overlying soft tissues. Recommend attention on follow-up.  Stable right chest tubes.  Postsurgical changes right hemithorax with associated consolidation, similar to prior.   Electronically Signed   By: Lovey Newcomer M.D.   On: 09/22/2017 10:54   Treatments:   NAME: Danny Duke, Danny Duke MEDICAL RECORD [id number] ACCOUNT [id number] DATE OF BIRTH:February 23, 1936 FACILITY: [facility] LOCATION: [facility] PHYSICIAN:STEVEN Chaya Jan, MD  OPERATIVE REPORT  DATE OF PROCEDURE:  09/22/2017  PREOPERATIVE DIAGNOSIS:  Non-small cell carcinoma, right middle lobe probable T1, N0.  POSTOPERATIVE DIAGNOSIS:  Nonsmall cell cancer, right middle lobe, clinical stage IB (T2, N0).    PROCEDURE:  Mediastinoscopy, right video-assisted thoracoscopy, right middle lobectomy (extrapleural) mediastinal lymph node sampling, intercostal nerve block.  SURGEON:  Modesto Charon, MD  ASSISTANT:  Jadene Pierini, PA-C and Ellwood Handler, PA-C.  ANESTHESIA:  General.  FINDINGS:  Mediastinoscopy enlarged, but otherwise benign appearing lymph nodes, no tumor seen on frozen section.  Right middle lobe mass anteriorly adherent to the chest wall, resecting extrapleural plane deep margins negative for tumor.    Discharge Exam: Blood pressure 122/71, pulse 76, temperature 98.3 F (36.8 C), temperature source Oral, resp. rate  20, height 5\' 8"  (1.727 m), weight 66.6 kg (146 lb 13.2 oz), SpO2 94 %.     General appearance: alert, cooperative and no distress Heart: regular rate and rhythm Lungs: fair air exchange throughout  Abdomen: benign Extremities: no edema or calf tenderness Wound: incis healing well   Disposition: Discharge disposition: 01-Home or Self Care       Discharge Instructions    Discharge patient   Complete by:  As directed    Discharge disposition:  01-Home or Self Care   Discharge patient date:  09/26/2017     Allergies as of 09/26/2017   No Known Allergies     Medication List    STOP taking these medications   lisinopril 40 MG tablet Commonly known as:  PRINIVIL,ZESTRIL   meclizine 12.5 MG tablet Commonly known as:  ANTIVERT   tadalafil 5 MG tablet Commonly known as:  CIALIS     TAKE these medications   amiodarone 200 MG tablet Commonly known as:  PACERONE Take 1 tablet (200 mg total) by mouth 2 (two) times daily.   amLODipine 10 MG tablet Commonly known as:  NORVASC Take 10 mg by mouth daily.   aspirin EC 81 MG tablet Take 81 mg by mouth daily.   metoprolol succinate 25 MG 24 hr tablet Commonly known as:  TOPROL-XL Take 1 tablet (  25 mg total) by mouth daily.   Oxycodone HCl 20 MG Tabs Take 1 tablet (20 mg total) by mouth every 6 (six) hours as needed for up to 5 days for severe pain. What changed:    when to take this  reasons to take this   simvastatin 20 MG tablet Commonly known as:  ZOCOR Take 20 mg by mouth daily.      Follow-up Information    [place], [place], [place]. Call in 1 day(s).   Specialty:  Physician Assistant Contact information: [place] [address] [phone]        [name], MD Follow up.   Specialty:  Cardiothoracic Surgery Why:  Your routine follow-up appointment is on 10/25/2016 at 9:45 AM.  Please arrive at 9:15 AM for a chest x-ray located at [facility] imaging which on the first floor  of our building. Contact information: [address] [place] [address] [phone]        nursing suture removal Follow up.   Why:  Your chest tube suture removal appointment is on 09/30/2017 at 10:30 AM. Contact information: Dr. Leonarda Salon office          Signed: John Giovanni 09/26/2017, 7:55 AM

## 2017-09-23 NOTE — Discharge Instructions (Signed)
Video-Assisted Thoracic Surgery, Care After ° °This sheet gives you information about how to care for yourself after your procedure. Your health care provider may also give you more specific instructions. If you have problems or questions, contact your health care provider. °What can I expect after the procedure? °After the procedure, it is common to have: °· Some pain and soreness in your chest. °· Pain when breathing in (inhaling) and coughing. °· Constipation. °· Fatigue. °· Difficulty sleeping. ° °Follow these instructions at home: °Preventing pneumonia °· Take deep breaths or do breathing exercises as instructed by your health care provider. Doing this helps prevent lung infection (pneumonia). °· Cough frequently. Coughing may cause discomfort, but it is important to clear mucus (phlegm) and expand your lungs. If it hurts to cough, hold a pillow against your chest or place the palms of both hands on top of the incision (use splinting) when you cough. This may help relieve discomfort. °· If you were given an incentive spirometer, use it as directed. An incentive spirometer is a tool that measures how well you are filling your lungs with each breath. °· Participate in pulmonary rehabilitation as directed by your health care provider. This is a program that combines education, exercise, and support from a team of specialists. The goal is to help you heal and get back to your normal activities as soon as possible. °Medicines °· Take over-the-counter or prescription medicines only as told by your health care provider. °· If you have pain, take pain-relieving medicine before your pain becomes severe. This is important because if your pain is under control, you will be able to breathe and cough more comfortably. °· If you were prescribed an antibiotic medicine, take it as told by your health care provider. Do not stop taking the antibiotic even if you start to feel better. °Activity °· Ask your health care provider  what activities are safe for you. °· Avoid activities that use your chest muscles for at least 3-4 weeks. °· Do not lift anything that is heavier than 10 lb (4.5 kg), or the limit that your health care provider tells you, until he or she says that it is safe. °Incision care °· Follow instructions from your health care provider about how to take care of your incision(s). Make sure you: °? Wash your hands with soap and water before you change your bandage (dressing). If soap and water are not available, use hand sanitizer. °? Change your dressing as told by your health care provider. °? Leave stitches (sutures), skin glue, or adhesive strips in place. These skin closures may need to stay in place for 2 weeks or longer. If adhesive strip edges start to loosen and curl up, you may trim the loose edges. Do not remove adhesive strips completely unless your health care provider tells you to do that. °· Keep your dressing dry until it has been removed. °· Check your incision area every day for signs of infection. Check for: °? Redness, swelling, or pain. °? Fluid or blood. °? Warmth. °? Pus or a bad smell. °Bathing °· Do not take baths, swim, or use a hot tub until your health care provider approves. You may take showers. °· After your dressing has been removed, use soap and water to gently wash your incision area. Do not use anything else to clean your incision(s) unless your health care provider tells you to do this. °Driving °· Do not drive until your health care provider approves. °· Do not drive   or use heavy machinery while taking prescription pain medicine. °Eating and drinking °· Eat a healthy, balanced diet as instructed by your health care provider. A healthy diet includes plenty of fresh fruits and vegetables, whole grains, and low-fat (lean) proteins. °· Limit foods that are high in fat and processed sugars, such as fried and sweet foods. °· Drink enough fluid to keep your urine clear or pale yellow. °General  instructions °· To prevent or treat constipation while you are taking prescription pain medicine, your health care provider may recommend that you: °? Take over-the-counter or prescription medicines. °? Eat foods that are high in fiber, such as beans, fresh fruits and vegetables, and whole grains. °· Do not use any products that contain nicotine or tobacco, such as cigarettes and e-cigarettes. If you need help quitting, ask your health care provider. °· Avoid secondhand smoke. °· Wear compression stockings as told by your health care provider. These stockings help to prevent blood clots and reduce swelling in your legs. °· If you have a chest tube, care for it as instructed by your health care provider. Do not travel by airplane during the 2 weeks after your chest tube is removed, or until your health care provider says that this is safe. °· Keep all follow-up visits as told by your health care provider. This is important. °Contact a health care provider if: °· You have redness, swelling, or pain around an incision. °· You have fluid or blood coming from an incision. °· Your incision area feels warm to the touch. °· You have pus or a bad smell coming from an incision. °· You have a fever or chills. °· You have nausea or vomiting. °· You have pain that does not get better with medicine. °Get help right away if: °· You have chest pain. °· Your heart is fluttering or beating rapidly. °· You develop a rash. °· You have shortness of breath or trouble breathing. °· You are confused. °· You have trouble speaking. °· You feel weak, light-headed, or dizzy. °· You faint. °Summary °· To help prevent lung infection (pneumonia), take deep breaths or do breathing exercises as instructed by your health care provider. °· Cough frequently to clear mucus (phlegm) and expand your lungs. If it hurts to cough, hold a pillow against your chest or place the palms of both hands on top of the incision (use splinting) when you cough. °· If  you have pain, take pain-relieving medicine before your pain becomes severe. This is important because if your pain is under control, you will be able to breathe and cough more comfortably. °· Ask your health care provider what activities are safe for you. °This information is not intended to replace advice given to you by your health care provider. Make sure you discuss any questions you have with your health care provider. °Document Released: 06/19/2012 Document Revised: 02/02/2016 Document Reviewed: 02/02/2016 °Elsevier Interactive Patient Education © 2017 [employer] ° °

## 2017-09-23 NOTE — Progress Notes (Addendum)
[address]       [place] [address]             [phone]      4 Days Post-Op Procedure(s) (LRB): MEDIASTINOSCOPY (N/A) RIGHT VIDEO ASSISTED THORACOSCOPY (VATS)/ LOBECTOMY (Right) Subjective: Feels fairly well, denies sign SOB . Ambulation is improving Some interm afib  Objective: Vital signs in last 24 hours: Temp:  [98.3 F (36.8 C)-98.8 F (37.1 C)] 98.8 F (37.1 C) (07/19 0724) Pulse Rate:  [71-86] 84 (07/19 0724) Cardiac Rhythm: Atrial fibrillation (07/19 0700) Resp:  [15-22] 15 (07/19 0724) BP: (110-144)/(70-86) 144/86 (07/19 0724) SpO2:  [95 %-99 %] 98 % (07/19 0724)  Hemodynamic parameters for last 24 hours:    Intake/Output from previous day: 07/18 0701 - 07/19 0700 In: 1361.3 [P.O.:840; I.V.:362.4; IV Piggyback:158.9] Out: 1410 [Urine:1050; Chest Tube:360] Intake/Output this shift: Total I/O In: -  Out: 125 [Urine:125]  General appearance: alert, cooperative and no distress Heart: regular rate and rhythm Lungs: some right basilar crackles Abdomen: benign Extremities: no edema or calf tenderness Wound: incis healing well  Lab Results: Recent Labs    09/21/17 0340  WBC 12.3*  HGB 11.0*  HCT 33.5*  PLT 120*   BMET:  Recent Labs    09/22/17 0148 09/23/17 0252  NA 135 137  K 3.5 4.0  CL 101 107  CO2 24 23  GLUCOSE 127* 112*  BUN 22 19  CREATININE 1.63* 1.42*  CALCIUM 9.1 8.9    PT/INR: No results for input(s): LABPROT, INR in the last 72 hours. ABG    Component Value Date/Time   PHART 7.410 09/20/2017 0300   HCO3 20.1 09/20/2017 0300   TCO2 30 09/19/2017 1852   ACIDBASEDEF 3.7 (H) 09/20/2017 0300   O2SAT 97.2 09/20/2017 0300   CBG (last 3)  Recent Labs    09/22/17 1215 09/22/17 1624 09/22/17 2104  GLUCAP 115* 111* 104*    Meds Scheduled Meds: . acetaminophen  1,000 mg Oral Q6H   Or  . acetaminophen (TYLENOL) oral liquid 160 mg/5 mL  1,000 mg Oral Q6H  . amiodarone  400 mg Oral BID  .  amLODipine  10 mg Oral Daily  . aspirin EC  81 mg Oral Daily  . bisacodyl  10 mg Oral Daily  . Chlorhexidine Gluconate Cloth  6 each Topical Daily  . enoxaparin (LOVENOX) injection  40 mg Subcutaneous Q24H  . feeding supplement (ENSURE ENLIVE)  237 mL Oral BID BM  . fentaNYL   Intravenous Q4H  . insulin aspart  0-9 Units Subcutaneous TID WC  . mouth rinse  15 mL Mouth Rinse BID  . metoCLOPramide (REGLAN) injection  10 mg Intravenous Q6H  . metoprolol succinate  25 mg Oral Daily  . senna-docusate  1 tablet Oral QHS  . simvastatin  20 mg Oral QHS  . sodium chloride flush  10-40 mL Intracatheter Q12H   Continuous Infusions: . sodium chloride 10 mL/hr at 09/22/17 1900  . potassium chloride     PRN Meds:.sodium chloride, diphenhydrAMINE **OR** diphenhydrAMINE, levalbuterol, meclizine, naloxone **AND** sodium chloride flush, ondansetron (ZOFRAN) IV, oxyCODONE, potassium chloride, traMADol  Xrays Dg Chest Port 1 View  Result Date: 09/22/2017 CLINICAL DATA:  Shortness of breath. EXAM: PORTABLE CHEST 1 VIEW COMPARISON:  Chest radiograph 09/21/2017. FINDINGS: Monitoring leads overlie the patient. Right chest tube stable in position. Stable cardiac and mediastinal contours with tortuosity of the thoracic aorta. Grossly unchanged heterogeneous opacities in postsurgical change right lung. Small  lucency right lung apex. No large pleural effusion. Subcutaneous emphysema overlying the right lateral chest wall. IMPRESSION: Tiny lucency right lung apex is nonspecific. Small right apical pneumothorax not excluded versus gas within the overlying soft tissues. Recommend attention on follow-up. Stable right chest tubes. Postsurgical changes right hemithorax with associated consolidation, similar to prior. Electronically Signed   By: Lovey Newcomer M.D.   On: 09/22/2017 10:54    Assessment/Plan: S/P Procedure(s) (LRB): MEDIASTINOSCOPY (N/A) RIGHT VIDEO ASSISTED THORACOSCOPY (VATS)/ LOBECTOMY (Right)  1 conts  to progress well 2 some afib at times- cont amio/Toprol. K+ = 4. Will check MG++ level/also TSH 3 creat trend improving 1.63>>>1.42 today. Does not appear to be on nephrotoxic meds 4 chest tube- 240 cc out yesterday, 120 so far today, + large air leak. Check CXR and cont tubes for now 5 push pulm toilet/ rehab as able- sats good on RA 6 some HTN- mostly well controlled- cont to monitor on current  meds 7 cont PCA for now- pain well controlled 8 sugars controlled 9 on lovenox for DVT prophy  John Giovanni 09/23/2017 Patient seen and examined, agree with above Creatinine better after discontinuing lisinopril I see a large tidal movement with no air leak- will dc anterior CT, keep posterior CT to water seal Path T2, N0 stage IB squamous cell carcinoma  Remo Lipps C. Roxan Hockey, MD [facility] [phone]

## 2017-09-24 ENCOUNTER — Inpatient Hospital Stay (HOSPITAL_COMMUNITY): Payer: Medicare Other

## 2017-09-24 LAB — GLUCOSE, CAPILLARY
GLUCOSE-CAPILLARY: 121 mg/dL — AB (ref 70–99)
GLUCOSE-CAPILLARY: 110 mg/dL — AB (ref 70–99)
Glucose-Capillary: 106 mg/dL — ABNORMAL HIGH (ref 70–99)
Glucose-Capillary: 109 mg/dL — ABNORMAL HIGH (ref 70–99)

## 2017-09-24 NOTE — Progress Notes (Addendum)
      [address]       [place] [address]             [phone]      5 Days Post-Op Procedure(s) (LRB): MEDIASTINOSCOPY (N/A) RIGHT VIDEO ASSISTED THORACOSCOPY (VATS)/ LOBECTOMY (Right)   Subjective:  No new complaints.  Denies pain, states he is doing pretty well.  + BM  Objective: Vital signs in last 24 hours: Temp:  [98.1 F (36.7 C)-99.1 F (37.3 C)] 98.1 F (36.7 C) (07/20 0324) Pulse Rate:  [74-98] 84 (07/20 0324) Cardiac Rhythm: Sinus tachycardia (07/20 0701) Resp:  [16-22] 18 (07/20 0324) BP: (108-130)/(68-86) 130/86 (07/20 0324) SpO2:  [97 %-100 %] 98 % (07/20 0324)  Intake/Output from previous day: 07/19 0701 - 07/20 0700 In: 900 [P.O.:600; I.V.:300] Out: 1345 [Urine:1225; Chest Tube:120] Intake/Output this shift: Total I/O In: -  Out: 300 [Urine:300]  General appearance: alert, cooperative and no distress Heart: regular rate and rhythm Lungs: clear to auscultation bilaterally and some whistling wtih expiration Abdomen: soft, non-tender; bowel sounds normal; no masses,  no organomegaly Extremities: extremities normal, atraumatic, no cyanosis or edema Wound: clean and dry  Lab Results: No results for input(s): WBC, HGB, HCT, PLT in the last 72 hours. BMET:  Recent Labs    09/22/17 0148 09/23/17 0252  NA 135 137  K 3.5 4.0  CL 101 107  CO2 24 23  GLUCOSE 127* 112*  BUN 22 19  CREATININE 1.63* 1.42*  CALCIUM 9.1 8.9    PT/INR: No results for input(s): LABPROT, INR in the last 72 hours. ABG    Component Value Date/Time   PHART 7.410 09/20/2017 0300   HCO3 20.1 09/20/2017 0300   TCO2 30 09/19/2017 1852   ACIDBASEDEF 3.7 (H) 09/20/2017 0300   O2SAT 97.2 09/20/2017 0300   CBG (last 3)  Recent Labs    09/23/17 1237 09/23/17 1702 09/23/17 2125  GLUCAP 111* 138* 90    Assessment/Plan: S/P Procedure(s) (LRB): MEDIASTINOSCOPY (N/A) RIGHT VIDEO ASSISTED THORACOSCOPY (VATS)/ LOBECTOMY (Right)  1. Chest tube- 230 nl  output, no air leak present- CXR without pneumothorax, sub q emphysema remains unchanged- possibly d/c chest tube today vs tomorrow 2. CV- PAF, maintaining Sinus Tach currently, TSH, MG level are okay- continue Amiodarone, Toprol, and Norvasc 3. Pulm- off oxygen, continue IS,  4. cbgs controlled, patient not a diabetic, will stop SSIP 5. Dispo- patient stable, no air leak present on water seal. CXR is free from pneumothorax, there is extensive sub q air present, possibly remove chest tube today vs tomorrow will discuss with staff.   LOS: 5 days    Ellwood Handler 09/24/2017 DC right chest tube in a.m. if no change in x-ray and no air leak present through chest tube Patient breathing comfortably without significant pain Dahlia Byes MD

## 2017-09-24 NOTE — Plan of Care (Signed)
Continue current care plan 

## 2017-09-25 ENCOUNTER — Inpatient Hospital Stay (HOSPITAL_COMMUNITY): Payer: Medicare Other

## 2017-09-25 LAB — GLUCOSE, CAPILLARY
GLUCOSE-CAPILLARY: 103 mg/dL — AB (ref 70–99)
Glucose-Capillary: 103 mg/dL — ABNORMAL HIGH (ref 70–99)

## 2017-09-25 NOTE — Progress Notes (Addendum)
      [address]       [place] [address]             [phone]      6 Days Post-Op Procedure(s) (LRB): MEDIASTINOSCOPY (N/A) RIGHT VIDEO ASSISTED THORACOSCOPY (VATS)/ LOBECTOMY (Right)   Subjective:  No new complaints.  Feels pretty good.  Asks if nephew can bring him some fried chicken for dinner.  + ambulation  + BM  Objective: Vital signs in last 24 hours: Temp:  [98 F (36.7 C)-99.4 F (37.4 C)] 98.3 F (36.8 C) (07/21 0720) Pulse Rate:  [61-102] 102 (07/21 0902) Cardiac Rhythm: Atrial fibrillation (07/21 0720) Resp:  [10-20] 11 (07/21 0720) BP: (127-147)/(78-100) 147/100 (07/21 0720) SpO2:  [90 %-99 %] 99 % (07/21 0720)  Intake/Output from previous day: 07/20 0701 - 07/21 0700 In: 899.8 [P.O.:720; I.V.:179.8] Out: 915 [Urine:825; Chest Tube:90] Intake/Output this shift: Total I/O In: -  Out: 275 [Urine:275]  General appearance: alert, cooperative and no distress Heart: regular rate and rhythm Lungs: clear to auscultation bilaterally Abdomen: soft, non-tender; bowel sounds normal; no masses,  no organomegaly Extremities: extremities normal, atraumatic, no cyanosis or edema Wound: clean and dry  Lab Results: No results for input(s): WBC, HGB, HCT, PLT in the last 72 hours. BMET:  Recent Labs    09/23/17 0252  NA 137  K 4.0  CL 107  CO2 23  GLUCOSE 112*  BUN 19  CREATININE 1.42*  CALCIUM 8.9    PT/INR: No results for input(s): LABPROT, INR in the last 72 hours. ABG    Component Value Date/Time   PHART 7.410 09/20/2017 0300   HCO3 20.1 09/20/2017 0300   TCO2 30 09/19/2017 1852   ACIDBASEDEF 3.7 (H) 09/20/2017 0300   O2SAT 97.2 09/20/2017 0300   CBG (last 3)  Recent Labs    09/24/17 1718 09/24/17 2113 09/25/17 0837  GLUCAP 121* 110* 103*    Assessment/Plan: S/P Procedure(s) (LRB): MEDIASTINOSCOPY (N/A) RIGHT VIDEO ASSISTED THORACOSCOPY (VATS)/ LOBECTOMY (Right)  1. Chest tube- no air leak present, output remains  minimal- will d/c chest tube today 2. Pulm- off oxygen, no acute issues, CXR with stable appearance of sub q air and basilar pneumothorax 3. CV- NSR- continue Amiodarone, Toprol, Norvasc 4. Dispo- patient stable, will d/c chest tube, PCA today, repeat CXR in AM if remains clinically stable, possible d/c in AM   LOS: 6 days    Erin Barrett 09/25/2017  chest tube out, cxr in am   incision clean,dry patient examined and medical record reviewed,agree with above note. Tharon Aquas Trigt III 09/25/2017

## 2017-09-25 NOTE — Progress Notes (Signed)
Wasted 20ML of fentanyl pca with Marita Kansas, Therapist, sports.

## 2017-09-26 ENCOUNTER — Inpatient Hospital Stay (HOSPITAL_COMMUNITY): Payer: Medicare Other

## 2017-09-26 MED ORDER — OXYCODONE HCL 20 MG PO TABS: 20.0000 mg | ORAL_TABLET | Freq: Four times a day (QID) | ORAL | 0 refills | Status: AC | PRN

## 2017-09-26 MED ORDER — AMIODARONE HCL 200 MG PO TABS: 200.0000 mg | ORAL_TABLET | Freq: Two times a day (BID) | ORAL | 1 refills | Status: DC

## 2017-09-26 MED ORDER — METOPROLOL SUCCINATE ER 25 MG PO TB24: 25.0000 mg | ORAL_TABLET | Freq: Every day | ORAL | 1 refills | Status: AC

## 2017-09-26 NOTE — Care Management Important Message (Signed)
Important Message  Patient Details  Name: Danny Duke MRN: [id number] Date of Birth: 08-29-1935   Medicare Important Message Given:  Yes    [name] P Spindale 09/26/2017, 1:19 PM

## 2017-09-26 NOTE — Progress Notes (Signed)
Explained and discussed discharge instructions to wife and pt. Follow up appt given to wife as well as prescriptions. To see dr. Leonarda Salon nurse on 7/26 to d/c sutures. Pain 4 in lower back and knee chromic pain oxy given po.

## 2017-09-26 NOTE — Plan of Care (Signed)
Pt met care plan, pt will be going home later today

## 2017-09-26 NOTE — Progress Notes (Addendum)
[address]       [place] [address]             [phone]      7 Days Post-Op Procedure(s) (LRB): MEDIASTINOSCOPY (N/A) RIGHT VIDEO ASSISTED THORACOSCOPY (VATS)/ LOBECTOMY (Right) Subjective: Feels well, ambulating well, no fevers, no productive cough,+ BM  Objective: Vital signs in last 24 hours: Temp:  [98.1 F (36.7 C)-98.7 F (37.1 C)] 98.3 F (36.8 C) (07/22 0717) Pulse Rate:  [70-102] 76 (07/22 0717) Cardiac Rhythm: Normal sinus rhythm (07/22 0700) Resp:  [13-20] 20 (07/22 0717) BP: (109-131)/(71-98) 122/71 (07/22 0717) SpO2:  [94 %-100 %] 94 % (07/22 0717)  Hemodynamic parameters for last 24 hours:    Intake/Output from previous day: 07/21 0701 - 07/22 0700 In: 480 [P.O.:480] Out: 925 [Urine:925] Intake/Output this shift: No intake/output data recorded.  General appearance: alert, cooperative and no distress Heart: regular rate and rhythm Lungs: fair air exchange throughout  Abdomen: benign Extremities: no edema or calf tenderness Wound: incis healing well  Lab Results: No results for input(s): WBC, HGB, HCT, PLT in the last 72 hours. BMET: No results for input(s): NA, K, CL, CO2, GLUCOSE, BUN, CREATININE, CALCIUM in the last 72 hours.  PT/INR: No results for input(s): LABPROT, INR in the last 72 hours. ABG    Component Value Date/Time   PHART 7.410 09/20/2017 0300   HCO3 20.1 09/20/2017 0300   TCO2 30 09/19/2017 1852   ACIDBASEDEF 3.7 (H) 09/20/2017 0300   O2SAT 97.2 09/20/2017 0300   CBG (last 3)  Recent Labs    09/24/17 2113 09/25/17 0837 09/25/17 1157  GLUCAP 110* 103* 103*    Meds Scheduled Meds: . amiodarone  400 mg Oral BID  . amLODipine  10 mg Oral Daily  . aspirin EC  81 mg Oral Daily  . bisacodyl  10 mg Oral Daily  . Chlorhexidine Gluconate Cloth  6 each Topical Daily  . enoxaparin (LOVENOX) injection  40 mg Subcutaneous Q24H  . feeding supplement (ENSURE ENLIVE)  237 mL Oral BID BM  . mouth rinse   15 mL Mouth Rinse BID  . metoprolol succinate  25 mg Oral Daily  . senna-docusate  1 tablet Oral QHS  . simvastatin  20 mg Oral QHS  . sodium chloride flush  10-40 mL Intracatheter Q12H   Continuous Infusions: . sodium chloride 10 mL/hr at 09/25/17 0000  . potassium chloride     PRN Meds:.sodium chloride, levalbuterol, meclizine, ondansetron (ZOFRAN) IV, oxyCODONE, potassium chloride, traMADol  Xrays Dg Chest Port 1 View  Result Date: 09/25/2017 CLINICAL DATA:  Right chest tube removal. Six days status post VATS. Ex-smoker. EXAM: PORTABLE CHEST 1 VIEW COMPARISON:  Earlier today. FINDINGS: The right chest tube has been removed. No significant change in the previously demonstrated approximately 5% right basilar pneumothorax and right subcutaneous emphysema. No significant change in mild patchy opacity in the right upper lung zone. The left lung remains clear. Biapical pleural and parenchymal scarring is again demonstrated. Normal sized heart. Tortuous aorta. Unremarkable bones. IMPRESSION: 1. Stable approximately 5% right basilar pneumothorax and right subcutaneous emphysema following right chest tube removal. 2. Stable probable mild postoperative edema or hemorrhage in the right upper lung zone. Pneumonia remains less likely. Electronically Signed   By: Claudie Revering M.D.   On: 09/25/2017 14:59   Dg Chest Port 1 View  Result Date: 09/25/2017 CLINICAL DATA:  Followup right subcutaneous emphysema following right lung lobectomy. EXAM: PORTABLE CHEST 1  VIEW COMPARISON:  Yesterday. FINDINGS: A right chest tube remains in place. Tiny right basilar pneumothorax without significant change. No significant change in right lateral subcutaneous emphysema. Mild right upper lung zone airspace opacity and pleural thickening or fluid is unchanged. Biapical pleural and parenchymal scarring is stable. Clear left lung. Normal sized heart. Thoracic spine degenerative changes. IMPRESSION: 1. Stable approximately 5%  right basilar pneumothorax with a chest tube in place. 2. Stable right lateral subcutaneous emphysema. 3. Stable probable mild postoperative edema or hemorrhage in the right upper lung zone. Pneumonia is less likely. Electronically Signed   By: Claudie Revering M.D.   On: 09/25/2017 08:11    Assessment/Plan: S/P Procedure(s) (LRB): MEDIASTINOSCOPY (N/A) RIGHT VIDEO ASSISTED THORACOSCOPY (VATS)/ LOBECTOMY (Right) Plan for discharge: see discharge orders hemodyn stable in sinus rhythm CXR is stable in appearance, sats good on RA Sugars well controlled   LOS: 7 days    Danny Duke 09/26/2017 Patient seen and examined, agree with above Home today  [place]. Roxan Hockey, MD [facility] [phone]

## 2017-09-29 ENCOUNTER — Other Ambulatory Visit: Payer: Self-pay | Admitting: *Deleted

## 2017-09-30 ENCOUNTER — Ambulatory Visit (INDEPENDENT_AMBULATORY_CARE_PROVIDER_SITE_OTHER): Payer: Self-pay

## 2017-09-30 DIAGNOSIS — Z4802 Encounter for removal of sutures: Secondary | ICD-10-CM

## 2017-09-30 NOTE — Progress Notes (Signed)
Removed 2 sutures from chest tube incision sites with no sign of infection and patient tolerated well.

## 2017-10-03 ENCOUNTER — Telehealth: Payer: Self-pay

## 2017-10-03 NOTE — Telephone Encounter (Signed)
Mr. Hase wife, Webb Silversmith contacted the office wanting to know if he could take Musinex DM.  She was aware that some medications would not be tolerated with his medications currently taken.  I advised him to take only the plain Musinex, with only the expectorant in it, no decongestant due to the possibility of raising his blood pressure/ heart rate.  She acknowledged receipt.

## 2017-10-24 ENCOUNTER — Other Ambulatory Visit: Payer: Self-pay | Admitting: Thoracic Surgery (Cardiothoracic Vascular Surgery)

## 2017-10-24 DIAGNOSIS — C3491 Malignant neoplasm of unspecified part of right bronchus or lung: Secondary | ICD-10-CM

## 2017-10-25 ENCOUNTER — Ambulatory Visit (INDEPENDENT_AMBULATORY_CARE_PROVIDER_SITE_OTHER): Payer: Self-pay | Admitting: Thoracic Surgery (Cardiothoracic Vascular Surgery)

## 2017-10-25 ENCOUNTER — Ambulatory Visit
Admission: RE | Admit: 2017-10-25 | Discharge: 2017-10-25 | Disposition: A | Discharge status: Home / Self Care | Payer: Medicare Other | Source: Ambulatory Visit | Attending: Thoracic Surgery (Cardiothoracic Vascular Surgery) | Admitting: Thoracic Surgery (Cardiothoracic Vascular Surgery)

## 2017-10-25 ENCOUNTER — Other Ambulatory Visit: Payer: Self-pay

## 2017-10-25 ENCOUNTER — Encounter: Payer: Self-pay | Admitting: Thoracic Surgery (Cardiothoracic Vascular Surgery)

## 2017-10-25 VITALS — BP 132/77 | HR 60 | Resp 18 | Ht 68.0 in | Wt 152.4 lb

## 2017-10-25 DIAGNOSIS — C3491 Malignant neoplasm of unspecified part of right bronchus or lung: Secondary | ICD-10-CM

## 2017-10-25 DIAGNOSIS — Z902 Acquired absence of lung [part of]: Secondary | ICD-10-CM

## 2017-10-25 MED ORDER — AMIODARONE HCL 200 MG PO TABS: 200.0000 mg | ORAL_TABLET | Freq: Every day | ORAL | 1 refills | Status: DC

## 2017-10-25 NOTE — Patient Instructions (Signed)
Decrease amiodarone to once daily

## 2017-10-25 NOTE — Progress Notes (Signed)
[address]       [place] [address]             [phone]       HPI: Danny Duke returns for a scheduled follow-up visit  Danny Duke is an 82 year old man with a history of remote tobacco abuse and emphysema.  He also has a history of hypertension, hyperlipidemia, chronic back pain, and arthritis.  Earlier this year he had an annual physical.  Chest x-ray showed a right middle lobe opacity.  CT of the chest showed a 3 cm right middle lobe nodule.  It was hypermetabolic on PET CT.  He had a CT-guided biopsy which showed squamous cell carcinoma.  He had enlarged lymph nodes were also hypermetabolic.  EBUS showed no tumor.    I did a mediastinoscopy followed by an extrapleural right middle lobectomy on 09/19/2017.  The tumor turned out to be 4.2 cm with involvement of the visceral pleura but no invasion of the actual chest wall.  All lymph nodes were negative.  Staging was T2, N0, 1B.  He had some transient atrial fibrillation postoperatively he converted to sinus rhythm with amiodarone.  He was discharged home on metoprolol and amiodarone.  He denies pain.  He feels well.  He is not having any respiratory difficulties.  He is anxious to go back to work.  Past Medical History:  Diagnosis Date  . Cancer [facility])    possible lung cancer  . Chronic back pain   . Hypercholesterolemia   . Hypertension       Current Outpatient Medications  Medication Sig Dispense Refill  . amiodarone (PACERONE) 200 MG tablet Take 1 tablet (200 mg total) by mouth daily. 60 tablet 1  . amLODipine (NORVASC) 10 MG tablet Take 10 mg by mouth daily.    Marland Kitchen aspirin EC 81 MG tablet Take 81 mg by mouth daily.    . metoprolol succinate (TOPROL-XL) 25 MG 24 hr tablet Take 1 tablet (25 mg total) by mouth daily. 60 tablet 1  . simvastatin (ZOCOR) 20 MG tablet Take 20 mg by mouth daily.     No current facility-administered medications for this visit.     Physical Exam BP 132/77 (BP Location:  Right Arm, Patient Position: Sitting, Cuff Size: Normal)   Pulse 60   Resp 18   Ht 5\' 8"  (1.727 m)   Wt 152 lb 6.4 oz (69.1 kg)   SpO2 98% Comment: RA  BMI 23.7 kg/m  82 year old man in no acute distress Alert and oriented x3 with no focal deficits Lungs clear with equal breath sounds bilaterally Incisions well-healed Cardiac regular rate and rhythm  Diagnostic Tests: CHEST - 2 VIEW  COMPARISON:  09/26/2017   FINDINGS: Postoperative changes on the right. Areas of pleural thickening, atelectasis and scarring throughout the right lung. Biapical pleural thickening/scarring. Left lung clear. Heart is normal size. No pneumothorax. Subcutaneous emphysema has resolved.  IMPRESSION: Postoperative changes on the right. Areas of scarring and/or atelectasis as well as pleural thickening. No pneumothorax.   Electronically Signed   By: Rolm Baptise M.D.   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  He is now about a month out from surgery and  is doing extremely well.  He is not taking any pain medication.  He does not have any discomfort.  He is anxious to return to work.  He did have a large tumor at 4.2 cm.  He needs to follow-up with oncology and radiation oncology to discuss potential for adjuvant chemo with or without radiation.  We will arrange for that appointment.  He may begin driving.  He should build into new activities gradually.  I think he should wait about another month before returning to work.  Postoperative atrial fibrillation-continue metoprolol.  Decrease amiodarone to 200 mg daily.  Consider discontinuing amiodarone at next follow-up  Plan: Decrease amiodarone to 200 mg  daily Return in 1 month with PA and lateral chest x-ray  [name], MD [facility] [phone]

## 2017-10-26 ENCOUNTER — Telehealth: Payer: Self-pay | Admitting: *Deleted

## 2017-10-26 DIAGNOSIS — C3491 Malignant neoplasm of unspecified part of right bronchus or lung: Secondary | ICD-10-CM

## 2017-10-26 NOTE — Telephone Encounter (Signed)
Oncology Nurse Navigator Documentation  Oncology Nurse Navigator Flowsheets 10/26/2017  Navigator Location [facility]  Referral date to RadOnc/MedOnc 10/24/2017  Navigator Encounter Type Telephone/I received referral on Danny Duke. I called and scheduled him to be seen on 11/10/17 at [facility].    Telephone Outgoing Call  Treatment Phase Other  Barriers/Navigation Needs Education;Coordination of Care  Education Other  Interventions Coordination of Care;Education  Coordination of Care Appts  Education Method Verbal  Acuity Level 1  Time Spent with Patient 15

## 2017-11-03 ENCOUNTER — Telehealth: Payer: Self-pay

## 2017-11-03 NOTE — Telephone Encounter (Signed)
Patient's wife called with questions about Amiodarone.  She stated that she could not remember if he was to continue taking the medication or to stop it.  According to Dr. Leonarda Salon note when the patient was seen in the office on 10/25/17, he stated for patient to continue, but decrease the dosage to 200 mg daily and he would consider stopping it at his next appointment here in the office on 11/29/17.  Patient's wife was explained the plan and will be seen at next follow-up.

## 2017-11-10 ENCOUNTER — Encounter: Payer: Self-pay | Admitting: *Deleted

## 2017-11-10 ENCOUNTER — Inpatient Hospital Stay: Payer: Medicare Other

## 2017-11-10 ENCOUNTER — Ambulatory Visit: Payer: Medicare Other | Attending: Internal Medicine | Admitting: Rehabilitation

## 2017-11-10 ENCOUNTER — Other Ambulatory Visit: Payer: Self-pay

## 2017-11-10 ENCOUNTER — Ambulatory Visit
Admission: RE | Admit: 2017-11-10 | Discharge: 2017-11-10 | Disposition: A | Discharge status: Home / Self Care | Payer: Medicare Other | Source: Ambulatory Visit | Attending: Radiation Oncology | Admitting: Radiation Oncology

## 2017-11-10 ENCOUNTER — Inpatient Hospital Stay: Payer: Medicare Other | Attending: Internal Medicine | Admitting: Internal Medicine

## 2017-11-10 ENCOUNTER — Encounter: Payer: Self-pay | Admitting: Internal Medicine

## 2017-11-10 ENCOUNTER — Encounter: Payer: Self-pay | Admitting: Rehabilitation

## 2017-11-10 ENCOUNTER — Telehealth: Payer: Self-pay | Admitting: Internal Medicine

## 2017-11-10 ENCOUNTER — Other Ambulatory Visit: Payer: Self-pay | Admitting: *Deleted

## 2017-11-10 VITALS — BP 130/79 | HR 70 | Temp 98.4°F | Resp 17 | Ht 68.0 in | Wt 155.6 lb

## 2017-11-10 DIAGNOSIS — C3491 Malignant neoplasm of unspecified part of right bronchus or lung: Secondary | ICD-10-CM

## 2017-11-10 DIAGNOSIS — Z7689 Persons encountering health services in other specified circumstances: Secondary | ICD-10-CM | POA: Insufficient documentation

## 2017-11-10 DIAGNOSIS — Z79899 Other long term (current) drug therapy: Secondary | ICD-10-CM | POA: Diagnosis not present

## 2017-11-10 DIAGNOSIS — I10 Essential (primary) hypertension: Secondary | ICD-10-CM | POA: Diagnosis not present

## 2017-11-10 DIAGNOSIS — Z7982 Long term (current) use of aspirin: Secondary | ICD-10-CM

## 2017-11-10 DIAGNOSIS — C342 Malignant neoplasm of middle lobe, bronchus or lung: Secondary | ICD-10-CM

## 2017-11-10 DIAGNOSIS — Z902 Acquired absence of lung [part of]: Secondary | ICD-10-CM | POA: Insufficient documentation

## 2017-11-10 DIAGNOSIS — Z5111 Encounter for antineoplastic chemotherapy: Secondary | ICD-10-CM | POA: Insufficient documentation

## 2017-11-10 DIAGNOSIS — E7800 Pure hypercholesterolemia, unspecified: Secondary | ICD-10-CM | POA: Diagnosis not present

## 2017-11-10 DIAGNOSIS — R066 Hiccough: Secondary | ICD-10-CM | POA: Diagnosis not present

## 2017-11-10 DIAGNOSIS — M549 Dorsalgia, unspecified: Secondary | ICD-10-CM | POA: Insufficient documentation

## 2017-11-10 DIAGNOSIS — R293 Abnormal posture: Secondary | ICD-10-CM

## 2017-11-10 LAB — CBC WITH DIFFERENTIAL (CANCER CENTER ONLY)
Basophils Absolute: 0 10*3/uL (ref 0.0–0.1)
Basophils Relative: 0 %
Eosinophils Absolute: 0 10*3/uL (ref 0.0–0.5)
Eosinophils Relative: 1 %
HEMATOCRIT: 38.5 % (ref 38.4–49.9)
Hemoglobin: 12.4 g/dL — ABNORMAL LOW (ref 13.0–17.1)
Lymphocytes Relative: 21 %
Lymphs Abs: 1.3 10*3/uL (ref 0.9–3.3)
MCH: 31 pg (ref 27.2–33.4)
MCHC: 32.2 g/dL (ref 32.0–36.0)
MCV: 96.3 fL (ref 79.3–98.0)
MONO ABS: 0.5 10*3/uL (ref 0.1–0.9)
MONOS PCT: 8 %
Neutro Abs: 4.5 10*3/uL (ref 1.5–6.5)
Neutrophils Relative %: 70 %
Platelet Count: 160 10*3/uL (ref 140–400)
RBC: 4 MIL/uL — ABNORMAL LOW (ref 4.20–5.82)
RDW: 14.1 % (ref 11.0–14.6)
WBC: 6.4 10*3/uL (ref 4.0–10.3)

## 2017-11-10 LAB — CMP (CANCER CENTER ONLY)
ALBUMIN: 3.1 g/dL — AB (ref 3.5–5.0)
ALK PHOS: 87 U/L (ref 38–126)
ALT: 7 U/L (ref 0–44)
AST: 15 U/L (ref 15–41)
Anion gap: 8 (ref 5–15)
BILIRUBIN TOTAL: 0.4 mg/dL (ref 0.3–1.2)
BUN: 13 mg/dL (ref 8–23)
CALCIUM: 9.5 mg/dL (ref 8.9–10.3)
CO2: 25 mmol/L (ref 22–32)
Chloride: 107 mmol/L (ref 98–111)
Creatinine: 1.4 mg/dL — ABNORMAL HIGH (ref 0.61–1.24)
GFR, EST NON AFRICAN AMERICAN: 46 mL/min — AB (ref >60)
GFR, Est AFR Am: 53 mL/min — ABNORMAL LOW (ref >60)
GLUCOSE: 110 mg/dL — AB (ref 70–99)
Potassium: 4.3 mmol/L (ref 3.5–5.1)
Sodium: 140 mmol/L (ref 135–145)
TOTAL PROTEIN: 7.9 g/dL (ref 6.5–8.1)

## 2017-11-10 MED ORDER — PROCHLORPERAZINE MALEATE 10 MG PO TABS: 10.0000 mg | ORAL_TABLET | Freq: Four times a day (QID) | ORAL | 0 refills | Status: DC | PRN

## 2017-11-10 NOTE — Telephone Encounter (Signed)
Appt for Chemo Edu scheduled/ added to infusion schedule for treatment start date of 9/11(?) and patient will be advised once appts have been added per 9/5 los

## 2017-11-10 NOTE — Progress Notes (Signed)
START ON PATHWAY REGIMEN - Non-Small Cell Lung     A cycle is every 21 days:     Paclitaxel      Carboplatin   **Always confirm dose/schedule in your pharmacy ordering system**  Patient Characteristics: Stage IIB - III - Resected (Adjuvant), No Prior Chemotherapy, Squamous Cell AJCC T Category: T3 Current Disease Status: No Distant Mets or Local Recurrence AJCC N Category: N0 AJCC M Category: M0 AJCC 8 Stage Grouping: IIB Histology: Squamous Cell Intent of Therapy: Curative Intent, Discussed with Patient

## 2017-11-10 NOTE — Progress Notes (Signed)
Oncology Nurse Navigator Documentation  Oncology Nurse Navigator Flowsheets 11/10/2017  Navigator Location [facility]  Navigator Encounter Type Clinic/MDC/I spoke with patient and wife today at thoracic clinic.  I gave and explained information on diagnosis, treatment, and patient/family support programs. Barriers identified is education and materials provided and explained.   Abnormal Finding Date 06/16/2017  Confirmed Diagnosis Date 09/19/2017  Surgery Date 09/19/2017  Multidisiplinary Clinic Date 11/10/2017  Patient Visit Type MedOnc  Treatment Phase Pre-Tx/Tx Discussion  Barriers/Navigation Needs Education  Education Other  Coordination of Care Other  Education Method Verbal;Written  Acuity Level 2  Time Spent with Patient 50

## 2017-11-10 NOTE — Progress Notes (Signed)
[facility] [phone]   [phone]  OFFICE PROGRESS NOTE  [place], [place], [place] [address]  DIAGNOSIS: Stage IIB (T3, N0, M0) non-small cell lung cancer, invasive well-differentiated squamous cell carcinoma presented with right middle lobe lung mass.  PRIOR THERAPY: Status post right VATS, right middle lobectomy with mediastinal lymph node sampling under the care of Dr. Roxan Hockey on 09/19/2017.  The tumor measured 4.2 cm but the carcinoma extends through the visceral pleura.  CURRENT THERAPY: Adjuvant systemic chemotherapy with carboplatin for AUC of 6 and paclitaxel 200 mg/M2 every 3 weeks.  First dose 11/16/2017.  INTERVAL HISTORY: Danny REIERSON 82 y.o. male returns to the clinic today for follow-up visit accompanied by his wife.  The patient is feeling fine today with no concerning complaints except for mild fatigue and shortness of breath with exertion.  He was seen few weeks ago by Dr. Roxan Hockey for the right middle lobe lung mass and he underwent right VATS with right middle lobectomy with mediastinal lymph node sampling on 09/19/2017 and the final pathology was consistent with invasive well-differentiated squamous cell carcinoma measuring 4.2 cm but with extension through the visceral pleura.  The patient is here today for evaluation and recommendation regarding adjuvant treatment.  He denied having any nausea, vomiting, diarrhea or constipation.  He denied having any fever or chills.  He has no headache or visual changes.  He has no recent weight loss or night sweats.  MEDICAL HISTORY: Past Medical History:  Diagnosis Date  . Cancer [facility])    possible lung cancer  . Chronic back pain   . Hypercholesterolemia   . Hypertension     ALLERGIES:  has No Known Allergies.  MEDICATIONS:  Current Outpatient Medications  Medication Sig Dispense Refill  . amiodarone (PACERONE) 200 MG tablet Take 1 tablet (200 mg  total) by mouth daily. 60 tablet 1  . amLODipine (NORVASC) 10 MG tablet Take 10 mg by mouth daily.    Marland Kitchen aspirin EC 81 MG tablet Take 81 mg by mouth daily.    Marland Kitchen lisinopril (PRINIVIL,ZESTRIL) 40 MG tablet Take 40 mg by mouth daily.  1  . metoprolol succinate (TOPROL-XL) 25 MG 24 hr tablet Take 1 tablet (25 mg total) by mouth daily. 60 tablet 1  . Oxycodone HCl 20 MG TABS Take 1 tablet by mouth every 6 (six) hours as needed.  0  . simvastatin (ZOCOR) 20 MG tablet Take 20 mg by mouth daily.    . tadalafil (CIALIS) 5 MG tablet Take 5 mg by mouth daily.  12   No current facility-administered medications for this visit.     SURGICAL HISTORY:  Past Surgical History:  Procedure Laterality Date  . BRONCHIAL NEEDLE ASPIRATION BIOPSY  08/05/2017   Procedure: BRONCHIAL NEEDLE ASPIRATION BIOPSIES;  Surgeon: Juanito Doom, MD;  Location: [facility];  Service: Cardiopulmonary;;  . ENDOBRONCHIAL ULTRASOUND Bilateral 08/05/2017   Procedure: ENDOBRONCHIAL ULTRASOUND;  Surgeon: Juanito Doom, MD;  Location: [facility];  Service: Cardiopulmonary;  Laterality: Bilateral;  . MEDIASTINOSCOPY N/A 09/19/2017   Procedure: MEDIASTINOSCOPY;  Surgeon: [name], MD;  Location: [facility] OR;  Service: Thoracic;  Laterality: N/A;  . MULTIPLE TOOTH EXTRACTIONS    . VIDEO ASSISTED THORACOSCOPY (VATS)/ LOBECTOMY Right 09/19/2017   Procedure: RIGHT VIDEO ASSISTED THORACOSCOPY (VATS)/ LOBECTOMY;  Surgeon: [name], MD;  Location: [place];  Service: Thoracic;  Laterality: Right;    REVIEW OF SYSTEMS:  Constitutional: positive  for fatigue Eyes: negative Ears, nose, mouth, throat, and face: negative Respiratory: positive for dyspnea on exertion Cardiovascular: negative Gastrointestinal: negative Genitourinary:negative Integument/breast: negative Hematologic/lymphatic: negative Musculoskeletal:negative Neurological: negative Behavioral/Psych: negative Endocrine: negative Allergic/Immunologic:  negative   PHYSICAL EXAMINATION: General appearance: alert, cooperative, fatigued and no distress Head: Normocephalic, without obvious abnormality, atraumatic Neck: no adenopathy, no JVD, supple, symmetrical, trachea midline and thyroid not enlarged, symmetric, no tenderness/mass/nodules Lymph nodes: Cervical, supraclavicular, and axillary nodes normal. Resp: clear to auscultation bilaterally Back: symmetric, no curvature. ROM normal. No CVA tenderness. Cardio: regular rate and rhythm, S1, S2 normal, no murmur, click, rub or gallop GI: soft, non-tender; bowel sounds normal; no masses,  no organomegaly Extremities: extremities normal, atraumatic, no cyanosis or edema Neurologic: Alert and oriented X 3, normal strength and tone. Normal symmetric reflexes. Normal coordination and gait  ECOG PERFORMANCE STATUS: 1 - Symptomatic but completely ambulatory  Blood pressure 130/79, pulse 70, temperature 98.4 F (36.9 C), temperature source Oral, resp. rate 17, height 5\' 8"  (1.727 m), weight 155 lb 9.6 oz (70.6 kg), SpO2 99 %.  LABORATORY DATA: Lab Results  Component Value Date   WBC 6.4 11/10/2017   HGB 12.4 (L) 11/10/2017   HCT 38.5 11/10/2017   MCV 96.3 11/10/2017   PLT 160 11/10/2017      Chemistry      Component Value Date/Time   NA 137 09/23/2017 0252   K 4.0 09/23/2017 0252   CL 107 09/23/2017 0252   CO2 23 09/23/2017 0252   BUN 19 09/23/2017 0252   CREATININE 1.42 (H) 09/23/2017 0252   CREATININE 1.47 (H) 08/25/2017 1424      Component Value Date/Time   CALCIUM 8.9 09/23/2017 0252   ALKPHOS 46 09/21/2017 0340   AST 25 09/21/2017 0340   AST 15 08/25/2017 1424   ALT 11 09/21/2017 0340   ALT <6 08/25/2017 1424   BILITOT 0.8 09/21/2017 0340   BILITOT 0.3 08/25/2017 1424       RADIOGRAPHIC STUDIES: Dg Chest 2 View  Result Date: 10/25/2017 CLINICAL DATA:  Post VATS EXAM: CHEST - 2 VIEW COMPARISON:  09/26/2017 FINDINGS: Postoperative changes on the right. Areas of pleural  thickening, atelectasis and scarring throughout the right lung. Biapical pleural thickening/scarring. Left lung clear. Heart is normal size. No pneumothorax. Subcutaneous emphysema has resolved. IMPRESSION: Postoperative changes on the right. Areas of scarring and/or atelectasis as well as pleural thickening. No pneumothorax. Electronically Signed   By: Rolm Baptise M.D.   On: 10/25/2017 09:38    ASSESSMENT AND PLAN: This is a very pleasant 82 years old African-American male recently diagnosed with stage IIB (T3, N0, M0) invasive well-differentiated squamous cell carcinoma presented with right middle lobe lung mass status post right middle lobectomy with lymph node sampling on September 19, 2017 under the care of Dr. Roxan Hockey. I had a lengthy discussion with the patient and his wife today about his current disease stage, prognosis and treatment options. I recommended for the patient consideration of treatment with adjuvant systemic chemotherapy with platinum based regimen for 4 cycles.  The patient may not be a good candidate for cisplatin because of his age and other comorbidities. I recommended for him treatment with carboplatin for AUC of 6 and paclitaxel 200 mg/M2 every 3 weeks with Neulasta support. I discussed with the patient the adverse effect of this treatment including but not limited to alopecia, myelosuppression, nausea and vomiting, peripheral neuropathy, liver or renal dysfunction. The patient would like to proceed with the treatment as planned.  He is expected to start the first cycle of this treatment on 11/16/2017. I will arrange for the patient to have a chemotherapy education class before the first dose of his treatment. I will send prescription for Compazine 10 mg p.o. every 6 hours as needed for nausea to his pharmacy. After completion of the adjuvant systemic chemotherapy, he may benefit from adjuvant radiotherapy to the invasive pleural area.  He was seen in the past by Dr.  Tammi Klippel. The patient will come back for follow-up visit in 2 weeks for evaluation and management of any adverse effect of his treatment. He was advised to call immediately if he has any concerning symptoms in the interval. The patient voices understanding of current disease status and treatment options and is in agreement with the current care plan.  All questions were answered. The patient knows to call the clinic with any problems, questions or concerns. We can certainly see the patient much sooner if necessary.  I spent 20 minutes counseling the patient face to face. The total time spent in the appointment was 30 minutes.  Disclaimer: This note was dictated with voice recognition software. Similar sounding words can inadvertently be transcribed and may not be corrected upon review.

## 2017-11-10 NOTE — Patient Instructions (Signed)
Given walking program information, why stay active during cancer treatment, and activity modification information

## 2017-11-10 NOTE — Therapy (Signed)
[place], [place], [address] Phone: [phone]   Fax:  [phone]  Physical Therapy Evaluation  Patient Details  Name: Danny Duke MRN: [id number] Date of Birth: [birth year] Referring Provider: [name]   Encounter Date: 11/10/2017  PT End of Session - 11/10/17 1538    Visit Number  1    Number of Visits  1    PT Start Time  0300    PT Stop Time  0320    PT Time Calculation (min)  20 min    Activity Tolerance  Patient tolerated treatment well    Behavior During Therapy  [facility] for tasks assessed/performed       Past Medical History:  Diagnosis Date  . Cancer [facility])    possible lung cancer  . Chronic back pain   . Hypercholesterolemia   . Hypertension     Past Surgical History:  Procedure Laterality Date  . BRONCHIAL NEEDLE ASPIRATION BIOPSY  08/05/2017   Procedure: BRONCHIAL NEEDLE ASPIRATION BIOPSIES;  Surgeon: Danny Doom, MD;  Location: [facility];  Service: Cardiopulmonary;;  . ENDOBRONCHIAL ULTRASOUND Bilateral 08/05/2017   Procedure: ENDOBRONCHIAL ULTRASOUND;  Surgeon: Danny Doom, MD;  Location: [facility];  Service: Cardiopulmonary;  Laterality: Bilateral;  . MEDIASTINOSCOPY N/A 09/19/2017   Procedure: MEDIASTINOSCOPY;  Surgeon: Danny Nakayama, MD;  Location: [facility] OR;  Service: Thoracic;  Laterality: N/A;  . MULTIPLE TOOTH EXTRACTIONS    . VIDEO ASSISTED THORACOSCOPY (VATS)/ LOBECTOMY Right 09/19/2017   Procedure: RIGHT VIDEO ASSISTED THORACOSCOPY (VATS)/ LOBECTOMY;  Surgeon: Danny Nakayama, MD;  Location: [place];  Service: Thoracic;  Laterality: Right;    There were no vitals filed for this visit.   Subjective Assessment - 11/10/17 1524    Subjective  I've never felt better    Patient is accompained by:  Family member    Pertinent History  HTN, high cholesterol, R lobectomy 09/22/17    Patient Stated Goals  get information from all providers     Currently in Pain?  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    Onset Date/Surgical Date  09/22/17    Hand Dominance  Right    Next MD Visit  next week    Prior Therapy  no      Precautions   Precaution Comments  cancer      Restrictions   Weight Bearing Restrictions  No    Other Position/Activity Restrictions  n      Balance Screen   Has the patient fallen in the past 6 months  No    Has the patient had a decrease in activity level because of a fear of falling?   No    Is the patient reluctant to leave their home because of a fear of falling?   No      Home Film/video editor residence    Living Arrangements  Spouse/significant other      Prior Function   Level of [place]  Retired    Leisure  walking and push ups x 20 each day, only doing 10 now      Cognition   Overall Cognitive Status  Within Functional Limits for tasks assessed      Coordination   Gross Motor Movements are Fluid and Coordinated  Yes      Functional Tests   Functional tests  Sit to Stand;Single leg stance;Other      Single Leg Stance   Comments  unable Rt/Lt      Sit to Stand   Comments  x12 in 30 seconds using hands      Other:   Other/ Comments  tandem stance EC increased sway x 6" SBA      Posture/Postural Control   Posture/Postural Control  Postural limitations                Objective measurements completed on examination: See above findings.              PT Education - 11/10/17 1538    Education Details  Given walking program information, why stay active during cancer treatment, and activity modification information    Person(s) Educated  Patient;Spouse    Methods  Explanation;Handout    Comprehension  Verbalized understanding             Lung Clinic Goals - 11/10/17 1542      Patient will be able to verbalize understanding of the benefit of exercise to decrease fatigue.   Status  Achieved      Patient will be able to verbalize the importance of posture.   Status  Achieved      Patient will be able to demonstrate diaphragmatic breathing for improved lung function.   Status  Deferred      Patient will be able to verbalize understanding of the role of physical therapy to prevent functional decline and who to contact if physical therapy is needed.   Status  Achieved           Plan - 11/10/17 1538    Clinical Impression Statement  Danny Duke presents to [facility] today feeling good overall.  He currently walks and does push ups daily.  No SOB with activity reported.  Able to perform sit to stand x 12 which is average to good per normative values.  Balance was diminshed by pt reports no LOB recently.  He was educated on combating side effects of future treatment with walking and continuing to exercise    History and Personal Factors relevant to plan of care:  09/23/17 lobectomy , HTN, OA    Clinical Presentation  Stable    Clinical Decision Making  Low    PT Frequency  One time visit    PT Treatment/Interventions  ADLs/Self Care Home Management;Patient/family education    Consulted and Agree with Plan of Care  Patient       Patient will benefit from skilled therapeutic intervention in order to improve the following deficits and impairments:     Visit Diagnosis: S/P lobectomy of lung  Squamous cell carcinoma lung, right [facility])     Problem List Patient Active Problem List   Diagnosis Date Noted  . Status post surgery 09/19/2017  . S/P lobectomy of lung 09/19/2017  . Squamous cell carcinoma lung, right ([place])   . COPD GOLD I  07/13/2017  . Essential hypertension 07/13/2017  . Mass of middle lobe of right lung 07/12/2017    Danny Duke, PT 11/10/2017, 3:43 PM  [place], [place], [address] Phone: [phone]   Fax:  [phone]  Name: Danny Duke MRN: [id number] Date of Birth:  02/16/1936  

## 2017-11-16 ENCOUNTER — Inpatient Hospital Stay: Payer: Medicare Other

## 2017-11-16 ENCOUNTER — Telehealth: Payer: Self-pay | Admitting: *Deleted

## 2017-11-16 DIAGNOSIS — C342 Malignant neoplasm of middle lobe, bronchus or lung: Secondary | ICD-10-CM | POA: Diagnosis not present

## 2017-11-16 DIAGNOSIS — C3491 Malignant neoplasm of unspecified part of right bronchus or lung: Secondary | ICD-10-CM

## 2017-11-16 LAB — CMP (CANCER CENTER ONLY)
ALT: 8 U/L (ref 0–44)
ANION GAP: 8 (ref 5–15)
AST: 13 U/L — ABNORMAL LOW (ref 15–41)
Albumin: 3.1 g/dL — ABNORMAL LOW (ref 3.5–5.0)
Alkaline Phosphatase: 85 U/L (ref 38–126)
BUN: 13 mg/dL (ref 8–23)
CALCIUM: 10 mg/dL (ref 8.9–10.3)
CHLORIDE: 106 mmol/L (ref 98–111)
CO2: 26 mmol/L (ref 22–32)
Creatinine: 1.38 mg/dL — ABNORMAL HIGH (ref 0.61–1.24)
GFR, Est AFR Am: 54 mL/min — ABNORMAL LOW (ref >60)
GFR, Estimated: 47 mL/min — ABNORMAL LOW (ref >60)
GLUCOSE: 97 mg/dL (ref 70–99)
POTASSIUM: 4.4 mmol/L (ref 3.5–5.1)
Sodium: 140 mmol/L (ref 135–145)
Total Bilirubin: 0.5 mg/dL (ref 0.3–1.2)
Total Protein: 8.1 g/dL (ref 6.5–8.1)

## 2017-11-16 LAB — CBC WITH DIFFERENTIAL (CANCER CENTER ONLY)
Basophils Absolute: 0 10*3/uL (ref 0.0–0.1)
Basophils Relative: 0 %
Eosinophils Absolute: 0.1 10*3/uL (ref 0.0–0.5)
Eosinophils Relative: 1 %
HEMATOCRIT: 39.1 % (ref 38.4–49.9)
HEMOGLOBIN: 12.7 g/dL — AB (ref 13.0–17.1)
LYMPHS ABS: 1.1 10*3/uL (ref 0.9–3.3)
LYMPHS PCT: 13 %
MCH: 30.8 pg (ref 27.2–33.4)
MCHC: 32.6 g/dL (ref 32.0–36.0)
MCV: 94.6 fL (ref 79.3–98.0)
MONO ABS: 0.7 10*3/uL (ref 0.1–0.9)
MONOS PCT: 9 %
NEUTROS ABS: 6.6 10*3/uL — AB (ref 1.5–6.5)
NEUTROS PCT: 77 %
Platelet Count: 163 10*3/uL (ref 140–400)
RBC: 4.14 MIL/uL — ABNORMAL LOW (ref 4.20–5.82)
RDW: 14.5 % (ref 11.0–14.6)
WBC Count: 8.6 10*3/uL (ref 4.0–10.3)

## 2017-11-16 NOTE — Telephone Encounter (Signed)
Call received from spouse of Danny Duke in reference to cancelling tomorrow's appointment.  Call transferred to collaborative to help with this request.

## 2017-11-17 ENCOUNTER — Inpatient Hospital Stay: Payer: Medicare Other

## 2017-11-17 NOTE — Telephone Encounter (Signed)
"  Awaiting return call from yesterday in reference to my husband's appointment today.  Also left message with scheduling.  Would like to reschedule.  Danny Duke would like to see Dr. Julien Nordmann.  He needed more time to think about chemotherapy after Chemo education class.  The information made [place] both nervous."

## 2017-11-19 ENCOUNTER — Ambulatory Visit: Payer: Medicare Other

## 2017-11-21 ENCOUNTER — Telehealth: Payer: Self-pay | Admitting: *Deleted

## 2017-11-21 NOTE — Telephone Encounter (Signed)
Call received from [place] wife "Danny Duke.  We just received a call in reference to an appointment Wednesday we knew nothing about.  He cancelled his first treatment appointment.  Were told the doctor would call and waiting hear from the doctor about canceling the first treatment." Call transferred to Collaborative to assist with this request.

## 2017-11-21 NOTE — Telephone Encounter (Signed)
Spoke w/ pt wife -pt wants to start treatment this week. Schedule message sent to r/s appts for September through 12/15/17.

## 2017-11-23 ENCOUNTER — Other Ambulatory Visit: Payer: Medicare Other

## 2017-11-23 ENCOUNTER — Ambulatory Visit: Payer: Medicare Other | Admitting: Oncology

## 2017-11-24 ENCOUNTER — Inpatient Hospital Stay: Payer: Medicare Other

## 2017-11-24 ENCOUNTER — Other Ambulatory Visit: Payer: Self-pay | Admitting: Internal Medicine

## 2017-11-24 ENCOUNTER — Telehealth: Payer: Self-pay | Admitting: Internal Medicine

## 2017-11-24 VITALS — BP 119/68 | HR 61 | Temp 98.7°F | Resp 16 | Wt 154.2 lb

## 2017-11-24 DIAGNOSIS — C342 Malignant neoplasm of middle lobe, bronchus or lung: Secondary | ICD-10-CM | POA: Diagnosis not present

## 2017-11-24 DIAGNOSIS — C3491 Malignant neoplasm of unspecified part of right bronchus or lung: Secondary | ICD-10-CM

## 2017-11-24 LAB — CMP (CANCER CENTER ONLY)
ALBUMIN: 3 g/dL — AB (ref 3.5–5.0)
ALK PHOS: 83 U/L (ref 38–126)
ALT: 15 U/L (ref 0–44)
AST: 17 U/L (ref 15–41)
Anion gap: 9 (ref 5–15)
BILIRUBIN TOTAL: 0.3 mg/dL (ref 0.3–1.2)
BUN: 15 mg/dL (ref 8–23)
CALCIUM: 9.5 mg/dL (ref 8.9–10.3)
CO2: 24 mmol/L (ref 22–32)
Chloride: 107 mmol/L (ref 98–111)
Creatinine: 1.44 mg/dL — ABNORMAL HIGH (ref 0.61–1.24)
GFR, EST AFRICAN AMERICAN: 51 mL/min — AB (ref >60)
GFR, EST NON AFRICAN AMERICAN: 44 mL/min — AB (ref >60)
GLUCOSE: 156 mg/dL — AB (ref 70–99)
POTASSIUM: 3.4 mmol/L — AB (ref 3.5–5.1)
Sodium: 140 mmol/L (ref 135–145)
Total Protein: 8.3 g/dL — ABNORMAL HIGH (ref 6.5–8.1)

## 2017-11-24 LAB — CBC WITH DIFFERENTIAL (CANCER CENTER ONLY)
BASOS PCT: 0 %
Basophils Absolute: 0 10*3/uL (ref 0.0–0.1)
EOS ABS: 0.1 10*3/uL (ref 0.0–0.5)
EOS PCT: 1 %
HCT: 38.4 % (ref 38.4–49.9)
Hemoglobin: 12.7 g/dL — ABNORMAL LOW (ref 13.0–17.1)
Lymphocytes Relative: 17 %
Lymphs Abs: 1.2 10*3/uL (ref 0.9–3.3)
MCH: 31.2 pg (ref 27.2–33.4)
MCHC: 33 g/dL (ref 32.0–36.0)
MCV: 94.5 fL (ref 79.3–98.0)
MONO ABS: 0.6 10*3/uL (ref 0.1–0.9)
MONOS PCT: 10 %
Neutro Abs: 4.9 10*3/uL (ref 1.5–6.5)
Neutrophils Relative %: 72 %
PLATELETS: 208 10*3/uL (ref 140–400)
RBC: 4.06 MIL/uL — ABNORMAL LOW (ref 4.20–5.82)
RDW: 14.2 % (ref 11.0–14.6)
WBC Count: 6.8 10*3/uL (ref 4.0–10.3)

## 2017-11-24 MED ORDER — FAMOTIDINE IN NACL 20-0.9 MG/50ML-% IV SOLN
20.0000 mg | Freq: Once | INTRAVENOUS | Status: AC
  Administered 2017-11-24: 20 mg via INTRAVENOUS

## 2017-11-24 MED ORDER — PALONOSETRON HCL INJECTION 0.25 MG/5ML
0.2500 mg | Freq: Once | INTRAVENOUS | Status: AC
  Administered 2017-11-24: 0.25 mg via INTRAVENOUS

## 2017-11-24 MED ORDER — SODIUM CHLORIDE 0.9 % IV SOLN
200.0000 mg/m2 | Freq: Once | INTRAVENOUS | Status: AC
  Administered 2017-11-24: 366 mg via INTRAVENOUS
  Filled 2017-11-24: qty 61

## 2017-11-24 MED ORDER — DIPHENHYDRAMINE HCL 50 MG/ML IJ SOLN
50.0000 mg | Freq: Once | INTRAMUSCULAR | Status: AC
  Administered 2017-11-24: 50 mg via INTRAVENOUS

## 2017-11-24 MED ORDER — SODIUM CHLORIDE 0.9 % IV SOLN
Freq: Once | INTRAVENOUS | Status: AC
  Administered 2017-11-24: 10:00:00 via INTRAVENOUS
  Filled 2017-11-24: qty 250

## 2017-11-24 MED ORDER — PALONOSETRON HCL INJECTION 0.25 MG/5ML
INTRAVENOUS | Status: AC
  Filled 2017-11-24: qty 5

## 2017-11-24 MED ORDER — SODIUM CHLORIDE 0.9 % IV SOLN
400.0000 mg | Freq: Once | INTRAVENOUS | Status: AC
  Administered 2017-11-24: 400 mg via INTRAVENOUS
  Filled 2017-11-24: qty 40

## 2017-11-24 MED ORDER — SODIUM CHLORIDE 0.9 % IV SOLN: 20.0000 mg | Freq: Once | INTRAVENOUS | Status: DC

## 2017-11-24 MED ORDER — SODIUM CHLORIDE 0.9 % IV SOLN
Freq: Once | INTRAVENOUS | Status: AC
  Administered 2017-11-24: 11:00:00 via INTRAVENOUS
  Filled 2017-11-24: qty 5

## 2017-11-24 MED ORDER — DIPHENHYDRAMINE HCL 50 MG/ML IJ SOLN
INTRAMUSCULAR | Status: AC
  Filled 2017-11-24: qty 1

## 2017-11-24 MED ORDER — FAMOTIDINE IN NACL 20-0.9 MG/50ML-% IV SOLN
INTRAVENOUS | Status: AC
  Filled 2017-11-24: qty 50

## 2017-11-24 NOTE — Progress Notes (Signed)
Call to physician to add discrete dose for carboplatin. All other orders okay to verify by pharmacy.

## 2017-11-24 NOTE — Telephone Encounter (Signed)
Appointments complete per 9/16 schedule message. Spoke with patient/wife while in infusion. Patient wife given appointment schedule for September and October.

## 2017-11-24 NOTE — Patient Instructions (Signed)
[facility] Discharge Instructions for Patients Receiving Chemotherapy  Today you received the following chemotherapy agents Taxol and carboplatin  To help prevent nausea and vomiting after your treatment, we encourage you to take your nausea medication as directed  If you develop nausea and vomiting that is not controlled by your nausea medication, call the clinic.   BELOW ARE SYMPTOMS THAT SHOULD BE REPORTED IMMEDIATELY:  *FEVER GREATER THAN 100.5 F  *CHILLS WITH OR WITHOUT FEVER  NAUSEA AND VOMITING THAT IS NOT CONTROLLED WITH YOUR NAUSEA MEDICATION  *UNUSUAL SHORTNESS OF BREATH  *UNUSUAL BRUISING OR BLEEDING  TENDERNESS IN MOUTH AND THROAT WITH OR WITHOUT PRESENCE OF ULCERS  *URINARY PROBLEMS  *BOWEL PROBLEMS  UNUSUAL RASH Items with * indicate a potential emergency and should be followed up as soon as possible.  Feel free to call the clinic should you have any questions or concerns. The clinic phone number is [phone].  Please show the [facility] at check-in to the Emergency Department and triage nurse.      Carboplatin injection What is this medicine? CARBOPLATIN (KAR boe pla tin) is a chemotherapy drug. It targets fast dividing cells, like cancer cells, and causes these cells to die. This medicine is used to treat ovarian cancer and many other cancers. This medicine may be used for other purposes; ask your health care provider or pharmacist if you have questions. COMMON BRAND NAME(S): Paraplatin What should I tell my health care provider before I take this medicine? They need to know if you have any of these conditions: -blood disorders -hearing problems -kidney disease -recent or ongoing radiation therapy -an unusual or allergic reaction to carboplatin, cisplatin, other chemotherapy, other medicines, foods, dyes, or preservatives -pregnant or trying to get pregnant -breast-feeding How should I use this medicine? This drug is  usually given as an infusion into a vein. It is administered in a hospital or clinic by a specially trained health care professional. Talk to your pediatrician regarding the use of this medicine in children. Special care may be needed. Overdosage: If you think you have taken too much of this medicine contact a poison control center or emergency room at once. NOTE: This medicine is only for you. Do not share this medicine with others. What if I miss a dose? It is important not to miss a dose. Call your doctor or health care professional if you are unable to keep an appointment. What may interact with this medicine? -medicines for seizures -medicines to increase blood counts like filgrastim, pegfilgrastim, sargramostim -some antibiotics like amikacin, gentamicin, neomycin, streptomycin, tobramycin -vaccines Talk to your doctor or health care professional before taking any of these medicines: -acetaminophen -aspirin -ibuprofen -ketoprofen -naproxen This list may not describe all possible interactions. Give your health care provider a list of all the medicines, herbs, non-prescription drugs, or dietary supplements you use. Also tell them if you smoke, drink alcohol, or use illegal drugs. Some items may interact with your medicine. What should I watch for while using this medicine? Your condition will be monitored carefully while you are receiving this medicine. You will need important blood work done while you are taking this medicine. This drug may make you feel generally unwell. This is not uncommon, as chemotherapy can affect healthy cells as well as cancer cells. Report any side effects. Continue your course of treatment even though you feel ill unless your doctor tells you to stop. In some cases, you may be given additional medicines to help  with side effects. Follow all directions for their use. Call your doctor or health care professional for advice if you get a fever, chills or sore throat,  or other symptoms of a cold or flu. Do not treat yourself. This drug decreases your body's ability to fight infections. Try to avoid being around people who are sick. This medicine may increase your risk to bruise or bleed. Call your doctor or health care professional if you notice any unusual bleeding. Be careful brushing and flossing your teeth or using a toothpick because you may get an infection or bleed more easily. If you have any dental work done, tell your dentist you are receiving this medicine. Avoid taking products that contain aspirin, acetaminophen, ibuprofen, naproxen, or ketoprofen unless instructed by your doctor. These medicines may hide a fever. Do not become pregnant while taking this medicine. Women should inform their doctor if they wish to become pregnant or think they might be pregnant. There is a potential for serious side effects to an unborn child. Talk to your health care professional or pharmacist for more information. Do not breast-feed an infant while taking this medicine. What side effects may I notice from receiving this medicine? Side effects that you should report to your doctor or health care professional as soon as possible: -allergic reactions like skin rash, itching or hives, swelling of the face, lips, or tongue -signs of infection - fever or chills, cough, sore throat, pain or difficulty passing urine -signs of decreased platelets or bleeding - bruising, pinpoint red spots on the skin, black, tarry stools, nosebleeds -signs of decreased red blood cells - unusually weak or tired, fainting spells, lightheadedness -breathing problems -changes in hearing -changes in vision -chest pain -high blood pressure -low blood counts - This drug may decrease the number of white blood cells, red blood cells and platelets. You may be at increased risk for infections and bleeding. -nausea and vomiting -pain, swelling, redness or irritation at the injection site -pain,  tingling, numbness in the hands or feet -problems with balance, talking, walking -trouble passing urine or change in the amount of urine Side effects that usually do not require medical attention (report to your doctor or health care professional if they continue or are bothersome): -hair loss -loss of appetite -metallic taste in the mouth or changes in taste This list may not describe all possible side effects. Call your doctor for medical advice about side effects. You may report side effects to FDA at 1-800-FDA-1088. Where should I keep my medicine? This drug is given in a hospital or clinic and will not be stored at home. NOTE: This sheet is a summary. It may not cover all possible information. If you have questions about this medicine, talk to your doctor, pharmacist, or health care provider.  2018 Elsevier/Gold Standard (2007-05-30 14:38:05) Paclitaxel injection What is this medicine? PACLITAXEL (PAK li TAX el) is a chemotherapy drug. It targets fast dividing cells, like cancer cells, and causes these cells to die. This medicine is used to treat ovarian cancer, breast cancer, and other cancers. This medicine may be used for other purposes; ask your health care provider or pharmacist if you have questions. COMMON BRAND NAME(S): Onxol, Taxol What should I tell my health care provider before I take this medicine? They need to know if you have any of these conditions: -blood disorders -irregular heartbeat -infection (especially a virus infection such as chickenpox, cold sores, or herpes) -liver disease -previous or ongoing radiation therapy -an unusual or  allergic reaction to paclitaxel, alcohol, polyoxyethylated castor oil, other chemotherapy agents, other medicines, foods, dyes, or preservatives -pregnant or trying to get pregnant -breast-feeding How should I use this medicine? This drug is given as an infusion into a vein. It is administered in a hospital or clinic by a specially  trained health care professional. Talk to your pediatrician regarding the use of this medicine in children. Special care may be needed. Overdosage: If you think you have taken too much of this medicine contact a poison control center or emergency room at once. NOTE: This medicine is only for you. Do not share this medicine with others. What if I miss a dose? It is important not to miss your dose. Call your doctor or health care professional if you are unable to keep an appointment. What may interact with this medicine? Do not take this medicine with any of the following medications: -disulfiram -metronidazole This medicine may also interact with the following medications: -cyclosporine -diazepam -ketoconazole -medicines to increase blood counts like filgrastim, pegfilgrastim, sargramostim -other chemotherapy drugs like cisplatin, doxorubicin, epirubicin, etoposide, teniposide, vincristine -quinidine -testosterone -vaccines -verapamil Talk to your doctor or health care professional before taking any of these medicines: -acetaminophen -aspirin -ibuprofen -ketoprofen -naproxen This list may not describe all possible interactions. Give your health care provider a list of all the medicines, herbs, non-prescription drugs, or dietary supplements you use. Also tell them if you smoke, drink alcohol, or use illegal drugs. Some items may interact with your medicine. What should I watch for while using this medicine? Your condition will be monitored carefully while you are receiving this medicine. You will need important blood work done while you are taking this medicine. This medicine can cause serious allergic reactions. To reduce your risk you will need to take other medicine(s) before treatment with this medicine. If you experience allergic reactions like skin rash, itching or hives, swelling of the face, lips, or tongue, tell your doctor or health care professional right away. In some cases,  you may be given additional medicines to help with side effects. Follow all directions for their use. This drug may make you feel generally unwell. This is not uncommon, as chemotherapy can affect healthy cells as well as cancer cells. Report any side effects. Continue your course of treatment even though you feel ill unless your doctor tells you to stop. Call your doctor or health care professional for advice if you get a fever, chills or sore throat, or other symptoms of a cold or flu. Do not treat yourself. This drug decreases your body's ability to fight infections. Try to avoid being around people who are sick. This medicine may increase your risk to bruise or bleed. Call your doctor or health care professional if you notice any unusual bleeding. Be careful brushing and flossing your teeth or using a toothpick because you may get an infection or bleed more easily. If you have any dental work done, tell your dentist you are receiving this medicine. Avoid taking products that contain aspirin, acetaminophen, ibuprofen, naproxen, or ketoprofen unless instructed by your doctor. These medicines may hide a fever. Do not become pregnant while taking this medicine. Women should inform their doctor if they wish to become pregnant or think they might be pregnant. There is a potential for serious side effects to an unborn child. Talk to your health care professional or pharmacist for more information. Do not breast-feed an infant while taking this medicine. Men are advised not to father a child  while receiving this medicine. This product may contain alcohol. Ask your pharmacist or healthcare provider if this medicine contains alcohol. Be sure to tell all healthcare providers you are taking this medicine. Certain medicines, like metronidazole and disulfiram, can cause an unpleasant reaction when taken with alcohol. The reaction includes flushing, headache, nausea, vomiting, sweating, and increased thirst. The  reaction can last from 30 minutes to several hours. What side effects may I notice from receiving this medicine? Side effects that you should report to your doctor or health care professional as soon as possible: -allergic reactions like skin rash, itching or hives, swelling of the face, lips, or tongue -low blood counts - This drug may decrease the number of white blood cells, red blood cells and platelets. You may be at increased risk for infections and bleeding. -signs of infection - fever or chills, cough, sore throat, pain or difficulty passing urine -signs of decreased platelets or bleeding - bruising, pinpoint red spots on the skin, black, tarry stools, nosebleeds -signs of decreased red blood cells - unusually weak or tired, fainting spells, lightheadedness -breathing problems -chest pain -high or low blood pressure -mouth sores -nausea and vomiting -pain, swelling, redness or irritation at the injection site -pain, tingling, numbness in the hands or feet -slow or irregular heartbeat -swelling of the ankle, feet, hands Side effects that usually do not require medical attention (report to your doctor or health care professional if they continue or are bothersome): -bone pain -complete hair loss including hair on your head, underarms, pubic hair, eyebrows, and eyelashes -changes in the color of fingernails -diarrhea -loosening of the fingernails -loss of appetite -muscle or joint pain -red flush to skin -sweating This list may not describe all possible side effects. Call your doctor for medical advice about side effects. You may report side effects to FDA at 1-800-FDA-1088. Where should I keep my medicine? This drug is given in a hospital or clinic and will not be stored at home. NOTE: This sheet is a summary. It may not cover all possible information. If you have questions about this medicine, talk to your doctor, pharmacist, or health care provider.  2018 Elsevier/Gold  Standard (2014-12-24 19:58:00)

## 2017-11-26 ENCOUNTER — Inpatient Hospital Stay: Payer: Medicare Other

## 2017-11-26 VITALS — BP 131/75 | HR 76 | Temp 97.9°F | Resp 16

## 2017-11-26 DIAGNOSIS — C342 Malignant neoplasm of middle lobe, bronchus or lung: Secondary | ICD-10-CM | POA: Diagnosis not present

## 2017-11-26 DIAGNOSIS — C3491 Malignant neoplasm of unspecified part of right bronchus or lung: Secondary | ICD-10-CM

## 2017-11-26 MED ORDER — PEGFILGRASTIM-CBQV 6 MG/0.6ML ~~LOC~~ SOSY
6.0000 mg | PREFILLED_SYRINGE | Freq: Once | SUBCUTANEOUS | Status: AC
  Administered 2017-11-26: 6 mg via SUBCUTANEOUS

## 2017-11-26 MED ORDER — PEGFILGRASTIM-CBQV 6 MG/0.6ML ~~LOC~~ SOSY
PREFILLED_SYRINGE | SUBCUTANEOUS | Status: AC
  Filled 2017-11-26: qty 0.6

## 2017-11-26 NOTE — Patient Instructions (Signed)
Pegfilgrastim injection What is this medicine? PEGFILGRASTIM (PEG fil gra stim) is a long-acting granulocyte colony-stimulating factor that stimulates the growth of neutrophils, a type of white blood cell important in the body's fight against infection. It is used to reduce the incidence of fever and infection in patients with certain types of cancer who are receiving chemotherapy that affects the bone marrow, and to increase survival after being exposed to high doses of radiation. This medicine may be used for other purposes; ask your health care provider or pharmacist if you have questions. COMMON BRAND NAME(S): Neulasta What should I tell my health care provider before I take this medicine? They need to know if you have any of these conditions: -kidney disease -latex allergy -ongoing radiation therapy -sickle cell disease -skin reactions to acrylic adhesives (On-Body Injector only) -an unusual or allergic reaction to pegfilgrastim, filgrastim, other medicines, foods, dyes, or preservatives -pregnant or trying to get pregnant -breast-feeding How should I use this medicine? This medicine is for injection under the skin. If you get this medicine at home, you will be taught how to prepare and give the pre-filled syringe or how to use the On-body Injector. Refer to the patient Instructions for Use for detailed instructions. Use exactly as directed. Tell your healthcare provider immediately if you suspect that the On-body Injector may not have performed as intended or if you suspect the use of the On-body Injector resulted in a missed or partial dose. It is important that you put your used needles and syringes in a special sharps container. Do not put them in a trash can. If you do not have a sharps container, call your pharmacist or healthcare provider to get one. Talk to your pediatrician regarding the use of this medicine in children. While this drug may be prescribed for selected conditions,  precautions do apply. Overdosage: If you think you have taken too much of this medicine contact a poison control center or emergency room at once. NOTE: This medicine is only for you. Do not share this medicine with others. What if I miss a dose? It is important not to miss your dose. Call your doctor or health care professional if you miss your dose. If you miss a dose due to an On-body Injector failure or leakage, a new dose should be administered as soon as possible using a single prefilled syringe for manual use. What may interact with this medicine? Interactions have not been studied. Give your health care provider a list of all the medicines, herbs, non-prescription drugs, or dietary supplements you use. Also tell them if you smoke, drink alcohol, or use illegal drugs. Some items may interact with your medicine. This list may not describe all possible interactions. Give your health care provider a list of all the medicines, herbs, non-prescription drugs, or dietary supplements you use. Also tell them if you smoke, drink alcohol, or use illegal drugs. Some items may interact with your medicine. What should I watch for while using this medicine? You may need blood work done while you are taking this medicine. If you are going to need a MRI, CT scan, or other procedure, tell your doctor that you are using this medicine (On-Body Injector only). What side effects may I notice from receiving this medicine? Side effects that you should report to your doctor or health care professional as soon as possible: -allergic reactions like skin rash, itching or hives, swelling of the face, lips, or tongue -dizziness -fever -pain, redness, or irritation at site   where injected -pinpoint red spots on the skin -red or dark-brown urine -shortness of breath or breathing problems -stomach or side pain, or pain at the shoulder -swelling -tiredness -trouble passing urine or change in the amount of urine Side  effects that usually do not require medical attention (report to your doctor or health care professional if they continue or are bothersome): -bone pain -muscle pain This list may not describe all possible side effects. Call your doctor for medical advice about side effects. You may report side effects to FDA at 1-800-FDA-1088. Where should I keep my medicine? Keep out of the reach of children. Store pre-filled syringes in a refrigerator between 2 and 8 degrees C (36 and 46 degrees F). Do not freeze. Keep in carton to protect from light. Throw away this medicine if it is left out of the refrigerator for more than 48 hours. Throw away any unused medicine after the expiration date. NOTE: This sheet is a summary. It may not cover all possible information. If you have questions about this medicine, talk to your doctor, pharmacist, or health care provider.  2018 Elsevier/Gold Standard (2016-02-19 12:58:03)  

## 2017-11-28 ENCOUNTER — Other Ambulatory Visit: Payer: Self-pay | Admitting: Thoracic Surgery (Cardiothoracic Vascular Surgery)

## 2017-11-28 ENCOUNTER — Telehealth: Payer: Self-pay | Admitting: *Deleted

## 2017-11-28 DIAGNOSIS — C3491 Malignant neoplasm of unspecified part of right bronchus or lung: Secondary | ICD-10-CM

## 2017-11-28 NOTE — Telephone Encounter (Signed)
Returned call to wife regarding pedicure. Discussed it is not a good idea to have manicure or pedicure at this time while going through treatment.Wife verbalized understanding no further concerns

## 2017-11-29 ENCOUNTER — Ambulatory Visit
Admission: RE | Admit: 2017-11-29 | Discharge: 2017-11-29 | Disposition: A | Discharge status: Home / Self Care | Payer: Medicare Other | Source: Ambulatory Visit | Attending: Thoracic Surgery (Cardiothoracic Vascular Surgery) | Admitting: Thoracic Surgery (Cardiothoracic Vascular Surgery)

## 2017-11-29 ENCOUNTER — Ambulatory Visit (INDEPENDENT_AMBULATORY_CARE_PROVIDER_SITE_OTHER): Payer: Self-pay | Admitting: Thoracic Surgery (Cardiothoracic Vascular Surgery)

## 2017-11-29 VITALS — BP 135/74 | HR 92 | Resp 20 | Ht 68.0 in | Wt 157.0 lb

## 2017-11-29 DIAGNOSIS — C3491 Malignant neoplasm of unspecified part of right bronchus or lung: Secondary | ICD-10-CM

## 2017-11-29 DIAGNOSIS — Z902 Acquired absence of lung [part of]: Secondary | ICD-10-CM

## 2017-11-29 NOTE — Progress Notes (Signed)
[address]       [place] [address]             [phone]      HPI: Danny Duke returns for scheduled follow-up visit  Danny Duke is an 82 year old gentleman with a remote history of tobacco abuse.  He also has a history of emphysema, hypertension, hyperlipidemia, chronic back pain, and arthritis.  He had a right middle lobectomy on 09/19/2017.  He had a T2, N0, stage IB squamous cell carcinoma.  Postoperatively he had atrial fibrillation and was treated with amiodarone.  He converted back to sinus prior to discharge.   I saw him in the office in August.  He was doing well at that time.  He was not having any pain.  I decreased his amiodarone to 200 mg daily.  In the interim since his last visit he has started chemotherapy.  He tolerated the first cycle of that well.  He does still have some decrease in his appetite  Current Outpatient Medications  Medication Sig Dispense Refill  . amLODipine (NORVASC) 10 MG tablet Take 10 mg by mouth daily.    Marland Kitchen aspirin EC 81 MG tablet Take 81 mg by mouth daily.    Marland Kitchen lisinopril (PRINIVIL,ZESTRIL) 40 MG tablet Take 40 mg by mouth daily.  1  . metoprolol succinate (TOPROL-XL) 25 MG 24 hr tablet Take 1 tablet (25 mg total) by mouth daily. 60 tablet 1  . Oxycodone HCl 20 MG TABS Take 1 tablet by mouth every 6 (six) hours as needed.  0  . prochlorperazine (COMPAZINE) 10 MG tablet Take 1 tablet (10 mg total) by mouth every 6 (six) hours as needed for nausea or vomiting. 30 tablet 0  . simvastatin (ZOCOR) 20 MG tablet Take 20 mg by mouth daily.    . tadalafil (CIALIS) 5 MG tablet Take 5 mg by mouth daily.  12   No current facility-administered medications for this visit.     Physical Exam BP 135/74   Pulse 92   Resp 20   Ht 5\' 8"  (1.727 m)   Wt 157 lb (71.2 kg)   SpO2 97% Comment: RA  BMI 23.36 kg/m  82 year old man in no acute distress Alert and oriented x3 with no focal deficits Incision well-healed Lungs clear with  equal breath sounds bilaterally Cardiac regular rate and rhythm normal S1-S2  Diagnostic Tests: CHEST - 2 VIEW  COMPARISON:  PA and lateral chest x-ray of October 25, 2017  FINDINGS: The left lung is well-expanded. There is stable apical pleural thickening and patchy density in the apex. There is persistent increased density in the left infrahilar region. There is shift of the mediastinum toward the right which is stable. Patchy interstitial density in the right upper, mid, and lower lung is present with blunting of the lateral and posterior costophrenic angles consistent with small pleural effusion. The heart is normal in size. There is calcification in the wall of the thoracic aorta. There is multilevel degenerative disc disease of the thoracic spine.  IMPRESSION: Stable pleuroparenchymal changes bilaterally. Small right pleural effusion. Stable mediastinal shift toward the right. No acute pneumonia nor obvious progression of disease.  Thoracic aortic atherosclerosis.   Electronically Signed   By: David  Martinique M.D.   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  He is receiving adjuvant chemotherapy.  He is doing well from the standpoint of surgery.  He does not have any pain.  He has had issues with appetite and nausea since the operation.  So that may be due to amiodarone.  He has had no cardiac issues since discharge, so I think we can stop the amiodarone at this point.  Plan: Discontinue amiodarone Continue with chemotherapy and follow-up with Dr. Julien Nordmann Return in 10 months for a one-year follow-up visit.  [name], MD [facility] [phone]

## 2017-11-30 ENCOUNTER — Other Ambulatory Visit: Payer: Self-pay | Admitting: Internal Medicine

## 2017-12-01 ENCOUNTER — Telehealth: Payer: Self-pay

## 2017-12-01 ENCOUNTER — Encounter: Payer: Self-pay | Admitting: Oncology

## 2017-12-01 ENCOUNTER — Other Ambulatory Visit: Payer: Self-pay | Admitting: *Deleted

## 2017-12-01 ENCOUNTER — Inpatient Hospital Stay (HOSPITAL_BASED_OUTPATIENT_CLINIC_OR_DEPARTMENT_OTHER): Payer: Medicare Other | Admitting: Oncology

## 2017-12-01 ENCOUNTER — Inpatient Hospital Stay: Payer: Medicare Other

## 2017-12-01 ENCOUNTER — Other Ambulatory Visit: Payer: Self-pay

## 2017-12-01 VITALS — BP 139/85 | HR 83 | Temp 99.4°F | Resp 18 | Ht 68.0 in | Wt 154.8 lb

## 2017-12-01 DIAGNOSIS — Z5111 Encounter for antineoplastic chemotherapy: Secondary | ICD-10-CM

## 2017-12-01 DIAGNOSIS — R066 Hiccough: Secondary | ICD-10-CM | POA: Diagnosis not present

## 2017-12-01 DIAGNOSIS — I10 Essential (primary) hypertension: Secondary | ICD-10-CM

## 2017-12-01 DIAGNOSIS — Z7689 Persons encountering health services in other specified circumstances: Secondary | ICD-10-CM | POA: Diagnosis not present

## 2017-12-01 DIAGNOSIS — C342 Malignant neoplasm of middle lobe, bronchus or lung: Secondary | ICD-10-CM

## 2017-12-01 DIAGNOSIS — Z7982 Long term (current) use of aspirin: Secondary | ICD-10-CM

## 2017-12-01 DIAGNOSIS — Z79899 Other long term (current) drug therapy: Secondary | ICD-10-CM

## 2017-12-01 DIAGNOSIS — M549 Dorsalgia, unspecified: Secondary | ICD-10-CM

## 2017-12-01 DIAGNOSIS — C3491 Malignant neoplasm of unspecified part of right bronchus or lung: Secondary | ICD-10-CM

## 2017-12-01 DIAGNOSIS — E7800 Pure hypercholesterolemia, unspecified: Secondary | ICD-10-CM

## 2017-12-01 LAB — CMP (CANCER CENTER ONLY)
ALBUMIN: 3.3 g/dL — AB (ref 3.5–5.0)
ALT: 82 U/L — ABNORMAL HIGH (ref 0–44)
ANION GAP: 7 (ref 5–15)
AST: 55 U/L — AB (ref 15–41)
Alkaline Phosphatase: 99 U/L (ref 38–126)
BUN: 13 mg/dL (ref 8–23)
CHLORIDE: 103 mmol/L (ref 98–111)
CO2: 28 mmol/L (ref 22–32)
Calcium: 9.8 mg/dL (ref 8.9–10.3)
Creatinine: 1.39 mg/dL — ABNORMAL HIGH (ref 0.61–1.24)
GFR, EST NON AFRICAN AMERICAN: 46 mL/min — AB (ref >60)
GFR, Est AFR Am: 54 mL/min — ABNORMAL LOW (ref >60)
Glucose, Bld: 110 mg/dL — ABNORMAL HIGH (ref 70–99)
POTASSIUM: 4.5 mmol/L (ref 3.5–5.1)
Sodium: 138 mmol/L (ref 135–145)
Total Bilirubin: 0.5 mg/dL (ref 0.3–1.2)
Total Protein: 8.1 g/dL (ref 6.5–8.1)

## 2017-12-01 LAB — CBC WITH DIFFERENTIAL (CANCER CENTER ONLY)
BASOS ABS: 0 10*3/uL (ref 0.0–0.1)
Basophils Relative: 1 %
EOS PCT: 5 %
Eosinophils Absolute: 0.2 10*3/uL (ref 0.0–0.5)
HEMATOCRIT: 37.5 % — AB (ref 38.4–49.9)
HEMOGLOBIN: 12.2 g/dL — AB (ref 13.0–17.1)
LYMPHS ABS: 1 10*3/uL (ref 0.9–3.3)
LYMPHS PCT: 31 %
MCH: 30.7 pg (ref 27.2–33.4)
MCHC: 32.5 g/dL (ref 32.0–36.0)
MCV: 94.3 fL (ref 79.3–98.0)
Monocytes Absolute: 0.6 10*3/uL (ref 0.1–0.9)
Monocytes Relative: 19 %
NEUTROS ABS: 1.4 10*3/uL — AB (ref 1.5–6.5)
Neutrophils Relative %: 44 %
PLATELETS: 92 10*3/uL — AB (ref 140–400)
RBC: 3.97 MIL/uL — AB (ref 4.20–5.82)
RDW: 14.2 % (ref 11.0–14.6)
WBC Count: 3.2 10*3/uL — ABNORMAL LOW (ref 4.0–10.3)

## 2017-12-01 MED ORDER — CHLORPROMAZINE HCL 25 MG PO TABS: 25.0000 mg | ORAL_TABLET | Freq: Three times a day (TID) | ORAL | 0 refills | Status: DC | PRN

## 2017-12-01 NOTE — Progress Notes (Signed)
[facility] OFFICE PROGRESS NOTE  [place], [place], [place] [address]  DIAGNOSIS: Stage IIB (T3, N0, M0) non-small cell lung cancer, invasive well-differentiated squamous cell carcinoma presented with right middle lobe lung mass.  PRIOR THERAPY: Status post right VATS, right middle lobectomy with mediastinal lymph node sampling under the care of Dr. Roxan Hockey on 09/19/2017.  The tumor measured 4.2 cm but the carcinoma extends through the visceral pleura.  CURRENT THERAPY: Adjuvant systemic chemotherapy with carboplatin for AUC of 6 and paclitaxel 200 mg/M2 every 3 weeks.  First dose 11/16/2017.  Status post 1 cycle.  INTERVAL HISTORY: Danny Duke 82 y.o. male returns for a routine follow-up visit accompanied by his wife.  The patient reports that he tolerated his first cycle of chemotherapy well overall.  He experienced arthralgias related to Neulasta.  He took Claritin and Tylenol as needed.  His arthralgias have resolved.  He denies fevers and chills.  Denies chest pain, shortness of breath, cough, hemoptysis.  Denies nausea, vomiting, constipation, diarrhea.  Denies recent weight loss or night sweats.  He has had increased hiccups with his chemotherapy.  The patient is here for evaluation and repeat lab work.  MEDICAL HISTORY: Past Medical History:  Diagnosis Date  . Cancer [facility])    possible lung cancer  . Chronic back pain   . Hypercholesterolemia   . Hypertension     ALLERGIES:  has No Known Allergies.  MEDICATIONS:  Current Outpatient Medications  Medication Sig Dispense Refill  . amLODipine (NORVASC) 10 MG tablet Take 10 mg by mouth daily.    Marland Kitchen aspirin EC 81 MG tablet Take 81 mg by mouth daily.    Marland Kitchen lisinopril (PRINIVIL,ZESTRIL) 40 MG tablet Take 40 mg by mouth daily.  1  . metoprolol succinate (TOPROL-XL) 25 MG 24 hr tablet Take 1 tablet (25 mg total) by mouth daily. 60 tablet 1  . Oxycodone HCl 20 MG TABS Take 1 tablet by mouth  every 6 (six) hours as needed.  0  . prochlorperazine (COMPAZINE) 10 MG tablet Take 1 tablet (10 mg total) by mouth every 6 (six) hours as needed for nausea or vomiting. 30 tablet 0  . simvastatin (ZOCOR) 20 MG tablet Take 20 mg by mouth daily.    . tadalafil (CIALIS) 5 MG tablet Take 5 mg by mouth daily.  12  . chlorproMAZINE (THORAZINE) 25 MG tablet Take 1 tablet (25 mg total) by mouth 3 (three) times daily as needed. 30 tablet 0   No current facility-administered medications for this visit.     SURGICAL HISTORY:  Past Surgical History:  Procedure Laterality Date  . BRONCHIAL NEEDLE ASPIRATION BIOPSY  08/05/2017   Procedure: BRONCHIAL NEEDLE ASPIRATION BIOPSIES;  Surgeon: Juanito Doom, MD;  Location: [facility];  Service: Cardiopulmonary;;  . ENDOBRONCHIAL ULTRASOUND Bilateral 08/05/2017   Procedure: ENDOBRONCHIAL ULTRASOUND;  Surgeon: Juanito Doom, MD;  Location: [facility];  Service: Cardiopulmonary;  Laterality: Bilateral;  . MEDIASTINOSCOPY N/A 09/19/2017   Procedure: MEDIASTINOSCOPY;  Surgeon: [name], MD;  [facility] OR;  Service: Thoracic;  Laterality: N/A;  . MULTIPLE TOOTH EXTRACTIONS    . VIDEO ASSISTED THORACOSCOPY (VATS)/ LOBECTOMY Right 09/19/2017   Procedure: RIGHT VIDEO ASSISTED THORACOSCOPY (VATS)/ LOBECTOMY;  Surgeon: [name], MD;  Location: [facility];  Service: Thoracic;  Laterality: Right;    REVIEW OF SYSTEMS:   Review of Systems  Constitutional: Negative for appetite change, chills, fatigue, fever and unexpected weight change.  HENT:  Negative for mouth sores, nosebleeds, sore throat and trouble swallowing.   Eyes: Negative for eye problems and icterus.  Respiratory: Negative for cough, hemoptysis, shortness of breath and wheezing.   Cardiovascular: Negative for chest pain and leg swelling.  Gastrointestinal: Negative for abdominal pain, constipation, diarrhea, nausea and vomiting.  Positive for hiccups. Genitourinary:  Negative for bladder incontinence, difficulty urinating, dysuria, frequency and hematuria.   Musculoskeletal: Negative for back pain, gait problem, neck pain and neck stiffness.  Skin: Negative for itching and rash.  Neurological: Negative for dizziness, extremity weakness, gait problem, headaches, light-headedness and seizures.  Hematological: Negative for adenopathy. Does not bruise/bleed easily.  Psychiatric/Behavioral: Negative for confusion, depression and sleep disturbance. The patient is not nervous/anxious.     PHYSICAL EXAMINATION:  Blood pressure 139/85, pulse 83, temperature 99.4 F (37.4 C), temperature source Oral, resp. rate 18, height 5\' 8"  (1.727 m), weight 154 lb 12.8 oz (70.2 kg), SpO2 97 %.  ECOG PERFORMANCE STATUS: 1 - Symptomatic but completely ambulatory  Physical Exam  Constitutional: Oriented to person, place, and time and well-developed, well-nourished, and in no distress. No distress.  HENT:  Head: Normocephalic and atraumatic.  Mouth/Throat: Oropharynx is clear and moist. No oropharyngeal exudate.  Eyes: Conjunctivae are normal. Right eye exhibits no discharge. Left eye exhibits no discharge. No scleral icterus.  Neck: Normal range of motion. Neck supple.  Cardiovascular: Normal rate, regular rhythm, normal heart sounds and intact distal pulses.   Pulmonary/Chest: Effort normal and breath sounds normal. No respiratory distress. No wheezes. No rales.  Abdominal: Soft. Bowel sounds are normal. Exhibits no distension and no mass. There is no tenderness.  Musculoskeletal: Normal range of motion. Exhibits no edema.  Lymphadenopathy:    No cervical adenopathy.  Neurological: Alert and oriented to person, place, and time. Exhibits normal muscle tone. Gait normal. Coordination normal.  Skin: Skin is warm and dry. No rash noted. Not diaphoretic. No erythema. No pallor.  Psychiatric: Mood, memory and judgment normal.  Vitals reviewed.  LABORATORY DATA: Lab Results   Component Value Date   WBC 3.2 (L) 12/01/2017   HGB 12.2 (L) 12/01/2017   HCT 37.5 (L) 12/01/2017   MCV 94.3 12/01/2017   PLT 92 (L) 12/01/2017      Chemistry      Component Value Date/Time   NA 138 12/01/2017 1014   K 4.5 12/01/2017 1014   CL 103 12/01/2017 1014   CO2 28 12/01/2017 1014   BUN 13 12/01/2017 1014   CREATININE 1.39 (H) 12/01/2017 1014      Component Value Date/Time   CALCIUM 9.8 12/01/2017 1014   ALKPHOS 99 12/01/2017 1014   AST 55 (H) 12/01/2017 1014   ALT 82 (H) 12/01/2017 1014   BILITOT 0.5 12/01/2017 1014       RADIOGRAPHIC STUDIES:  Dg Chest 2 View  Result Date: 11/29/2017 CLINICAL DATA:  History of right lobectomy in July 2015 for squamous cell lung malignancy. EXAM: CHEST - 2 VIEW COMPARISON:  PA and lateral chest x-ray of October 25, 2017 FINDINGS: The left lung is well-expanded. There is stable apical pleural thickening and patchy density in the apex. There is persistent increased density in the left infrahilar region. There is shift of the mediastinum toward the right which is stable. Patchy interstitial density in the right upper, mid, and lower lung is present with blunting of the lateral and posterior costophrenic angles consistent with small pleural effusion. The heart is normal in size. There is calcification in the wall of  the thoracic aorta. There is multilevel degenerative disc disease of the thoracic spine. IMPRESSION: Stable pleuroparenchymal changes bilaterally. Small right pleural effusion. Stable mediastinal shift toward the right. No acute pneumonia nor obvious progression of disease. Thoracic aortic atherosclerosis. Electronically Signed   By: David  Martinique M.D.   On: 11/29/2017 10:35     ASSESSMENT/PLAN:  Squamous cell carcinoma lung, right [facility]) This is a very pleasant 82 year old African-American male recently diagnosed with stage IIB (T3, N0, M0) invasive well-differentiated squamous cell carcinoma presented with right middle lobe  lung mass status post right middle lobectomy with lymph node sampling on September 19, 2017 under the care of Dr. Roxan Hockey. He is currently on adjuvant systemic chemotherapy with carboplatin for AUC of 6 and paclitaxel 200 mg/M2 every 3 weeks with Neulasta support.  Status post 1 cycle.  He tolerated the first cycle well overall with exception of arthralgias related to Neulasta and hiccups. Labs have been reviewed with the patient and his wife and are overall stable.  Recommend that he continue to have weekly labs.  For hiccups, he was given a prescription for Thorazine 25 mg 3 times a day as needed.  The patient will follow-up in 2 weeks for evaluation prior to cycle #2 of his chemotherapy.  He was advised to call immediately if he has any concerning symptoms in the interval. The patient voices understanding of current disease status and treatment options and is in agreement with the current care plan.  All questions were answered. The patient knows to call the clinic with any problems, questions or concerns. We can certainly see the patient much sooner if necessary.   No orders of the defined types were placed in this encounter.    Mikey Bussing, DNP, AGPCNP-BC, AOCNP 12/01/17

## 2017-12-01 NOTE — Assessment & Plan Note (Signed)
This is a very pleasant 81 year old African-American male recently diagnosed with stage IIB (T3, N0, M0) invasive well-differentiated squamous cell carcinoma presented with right middle lobe lung mass status post right middle lobectomy with lymph node sampling on September 19, 2017 under the care of Dr. Roxan Hockey. He is currently on adjuvant systemic chemotherapy with carboplatin for AUC of 6 and paclitaxel 200 mg/M2 every 3 weeks with Neulasta support.  Status post 1 cycle.  He tolerated the first cycle well overall with exception of arthralgias related to Neulasta and hiccups. Labs have been reviewed with the patient and his wife and are overall stable.  Recommend that he continue to have weekly labs.  For hiccups, he was given a prescription for Thorazine 25 mg 3 times a day as needed.  The patient will follow-up in 2 weeks for evaluation prior to cycle #2 of his chemotherapy.  He was advised to call immediately if he has any concerning symptoms in the interval. The patient voices understanding of current disease status and treatment options and is in agreement with the current care plan.  All questions were answered. The patient knows to call the clinic with any problems, questions or concerns. We can certainly see the patient much sooner if necessary.

## 2017-12-01 NOTE — Telephone Encounter (Signed)
Printed avs and calender of upcoming appointment. Per 9/26 los 

## 2017-12-07 ENCOUNTER — Ambulatory Visit: Payer: Medicare Other

## 2017-12-08 ENCOUNTER — Inpatient Hospital Stay: Payer: Medicare Other | Attending: Internal Medicine

## 2017-12-08 DIAGNOSIS — G629 Polyneuropathy, unspecified: Secondary | ICD-10-CM | POA: Insufficient documentation

## 2017-12-08 DIAGNOSIS — M549 Dorsalgia, unspecified: Secondary | ICD-10-CM | POA: Diagnosis not present

## 2017-12-08 DIAGNOSIS — E7800 Pure hypercholesterolemia, unspecified: Secondary | ICD-10-CM | POA: Insufficient documentation

## 2017-12-08 DIAGNOSIS — Z7689 Persons encountering health services in other specified circumstances: Secondary | ICD-10-CM | POA: Insufficient documentation

## 2017-12-08 DIAGNOSIS — I10 Essential (primary) hypertension: Secondary | ICD-10-CM | POA: Insufficient documentation

## 2017-12-08 DIAGNOSIS — Z79899 Other long term (current) drug therapy: Secondary | ICD-10-CM | POA: Diagnosis not present

## 2017-12-08 DIAGNOSIS — R630 Anorexia: Secondary | ICD-10-CM | POA: Diagnosis not present

## 2017-12-08 DIAGNOSIS — R634 Abnormal weight loss: Secondary | ICD-10-CM | POA: Insufficient documentation

## 2017-12-08 DIAGNOSIS — Z7982 Long term (current) use of aspirin: Secondary | ICD-10-CM | POA: Diagnosis not present

## 2017-12-08 DIAGNOSIS — C342 Malignant neoplasm of middle lobe, bronchus or lung: Secondary | ICD-10-CM | POA: Diagnosis not present

## 2017-12-08 DIAGNOSIS — C3491 Malignant neoplasm of unspecified part of right bronchus or lung: Secondary | ICD-10-CM

## 2017-12-08 DIAGNOSIS — Z5111 Encounter for antineoplastic chemotherapy: Secondary | ICD-10-CM | POA: Diagnosis not present

## 2017-12-08 DIAGNOSIS — K5900 Constipation, unspecified: Secondary | ICD-10-CM | POA: Diagnosis not present

## 2017-12-08 DIAGNOSIS — R5383 Other fatigue: Secondary | ICD-10-CM | POA: Insufficient documentation

## 2017-12-08 LAB — CMP (CANCER CENTER ONLY)
ALT: 25 U/L (ref 0–44)
AST: 19 U/L (ref 15–41)
Albumin: 3.1 g/dL — ABNORMAL LOW (ref 3.5–5.0)
Alkaline Phosphatase: 117 U/L (ref 38–126)
Anion gap: 8 (ref 5–15)
BUN: 13 mg/dL (ref 8–23)
CHLORIDE: 107 mmol/L (ref 98–111)
CO2: 24 mmol/L (ref 22–32)
Calcium: 9.6 mg/dL (ref 8.9–10.3)
Creatinine: 1.6 mg/dL — ABNORMAL HIGH (ref 0.61–1.24)
GFR, EST AFRICAN AMERICAN: 45 mL/min — AB (ref >60)
GFR, EST NON AFRICAN AMERICAN: 39 mL/min — AB (ref >60)
Glucose, Bld: 109 mg/dL — ABNORMAL HIGH (ref 70–99)
POTASSIUM: 4.5 mmol/L (ref 3.5–5.1)
SODIUM: 139 mmol/L (ref 135–145)
Total Bilirubin: 0.3 mg/dL (ref 0.3–1.2)
Total Protein: 7.8 g/dL (ref 6.5–8.1)

## 2017-12-08 LAB — CBC WITH DIFFERENTIAL (CANCER CENTER ONLY)
Basophils Absolute: 0 10*3/uL (ref 0.0–0.1)
Basophils Relative: 0 %
Eosinophils Absolute: 0.1 10*3/uL (ref 0.0–0.5)
Eosinophils Relative: 0 %
HCT: 37.1 % — ABNORMAL LOW (ref 38.4–49.9)
HEMOGLOBIN: 12.3 g/dL — AB (ref 13.0–17.1)
LYMPHS ABS: 1.6 10*3/uL (ref 0.9–3.3)
LYMPHS PCT: 10 %
MCH: 30.9 pg (ref 27.2–33.4)
MCHC: 33.1 g/dL (ref 32.0–36.0)
MCV: 93.2 fL (ref 79.3–98.0)
Monocytes Absolute: 1.5 10*3/uL — ABNORMAL HIGH (ref 0.1–0.9)
Monocytes Relative: 10 %
NEUTROS PCT: 80 %
Neutro Abs: 12.7 10*3/uL — ABNORMAL HIGH (ref 1.5–6.5)
Platelet Count: 100 10*3/uL — ABNORMAL LOW (ref 140–400)
RBC: 3.98 MIL/uL — AB (ref 4.20–5.82)
RDW: 14.8 % — ABNORMAL HIGH (ref 11.0–14.6)
WBC: 15.9 10*3/uL — AB (ref 4.0–10.3)

## 2017-12-09 ENCOUNTER — Ambulatory Visit: Payer: Medicare Other

## 2017-12-13 ENCOUNTER — Inpatient Hospital Stay: Payer: Medicare Other

## 2017-12-13 ENCOUNTER — Encounter: Payer: Self-pay | Admitting: Internal Medicine

## 2017-12-13 ENCOUNTER — Telehealth: Payer: Self-pay | Admitting: Oncology

## 2017-12-13 ENCOUNTER — Telehealth: Payer: Self-pay

## 2017-12-13 ENCOUNTER — Inpatient Hospital Stay (HOSPITAL_BASED_OUTPATIENT_CLINIC_OR_DEPARTMENT_OTHER): Payer: Medicare Other | Admitting: Internal Medicine

## 2017-12-13 VITALS — BP 131/74 | HR 74 | Temp 98.6°F | Resp 18 | Ht 68.0 in | Wt 156.7 lb

## 2017-12-13 DIAGNOSIS — Z7982 Long term (current) use of aspirin: Secondary | ICD-10-CM

## 2017-12-13 DIAGNOSIS — E7800 Pure hypercholesterolemia, unspecified: Secondary | ICD-10-CM

## 2017-12-13 DIAGNOSIS — R5383 Other fatigue: Secondary | ICD-10-CM | POA: Diagnosis not present

## 2017-12-13 DIAGNOSIS — Z79899 Other long term (current) drug therapy: Secondary | ICD-10-CM

## 2017-12-13 DIAGNOSIS — I10 Essential (primary) hypertension: Secondary | ICD-10-CM

## 2017-12-13 DIAGNOSIS — M549 Dorsalgia, unspecified: Secondary | ICD-10-CM

## 2017-12-13 DIAGNOSIS — C342 Malignant neoplasm of middle lobe, bronchus or lung: Secondary | ICD-10-CM | POA: Diagnosis not present

## 2017-12-13 DIAGNOSIS — C3491 Malignant neoplasm of unspecified part of right bronchus or lung: Secondary | ICD-10-CM

## 2017-12-13 DIAGNOSIS — Z7189 Other specified counseling: Secondary | ICD-10-CM

## 2017-12-13 DIAGNOSIS — Z7689 Persons encountering health services in other specified circumstances: Secondary | ICD-10-CM

## 2017-12-13 DIAGNOSIS — Z5111 Encounter for antineoplastic chemotherapy: Secondary | ICD-10-CM | POA: Diagnosis not present

## 2017-12-13 LAB — CBC WITH DIFFERENTIAL (CANCER CENTER ONLY)
Abs Immature Granulocytes: 0.05 10*3/uL (ref 0.00–0.07)
BASOS PCT: 1 %
Basophils Absolute: 0.1 10*3/uL (ref 0.0–0.1)
EOS ABS: 0.1 10*3/uL (ref 0.0–0.5)
EOS PCT: 1 %
HCT: 36.1 % — ABNORMAL LOW (ref 39.0–52.0)
Hemoglobin: 11.6 g/dL — ABNORMAL LOW (ref 13.0–17.0)
Immature Granulocytes: 0 %
Lymphocytes Relative: 15 %
Lymphs Abs: 1.9 10*3/uL (ref 0.7–4.0)
MCH: 30.5 pg (ref 26.0–34.0)
MCHC: 32.1 g/dL (ref 30.0–36.0)
MCV: 95 fL (ref 80.0–100.0)
MONO ABS: 1 10*3/uL (ref 0.1–1.0)
MONOS PCT: 8 %
Neutro Abs: 9 10*3/uL — ABNORMAL HIGH (ref 1.7–7.7)
Neutrophils Relative %: 75 %
PLATELETS: 251 10*3/uL (ref 150–400)
RBC: 3.8 MIL/uL — ABNORMAL LOW (ref 4.22–5.81)
RDW: 14.5 % (ref 11.5–15.5)
WBC Count: 12.1 10*3/uL — ABNORMAL HIGH (ref 4.0–10.5)
nRBC: 0 % (ref 0.0–0.2)

## 2017-12-13 LAB — CMP (CANCER CENTER ONLY)
ALK PHOS: 94 U/L (ref 38–126)
ALT: 15 U/L (ref 0–44)
ANION GAP: 8 (ref 5–15)
AST: 18 U/L (ref 15–41)
Albumin: 3 g/dL — ABNORMAL LOW (ref 3.5–5.0)
BILIRUBIN TOTAL: 0.3 mg/dL (ref 0.3–1.2)
BUN: 11 mg/dL (ref 8–23)
CALCIUM: 9.3 mg/dL (ref 8.9–10.3)
CO2: 25 mmol/L (ref 22–32)
CREATININE: 1.39 mg/dL — AB (ref 0.61–1.24)
Chloride: 108 mmol/L (ref 98–111)
GFR, Est AFR Am: 54 mL/min — ABNORMAL LOW (ref >60)
GFR, Estimated: 46 mL/min — ABNORMAL LOW (ref >60)
GLUCOSE: 103 mg/dL — AB (ref 70–99)
Potassium: 4.3 mmol/L (ref 3.5–5.1)
Sodium: 141 mmol/L (ref 135–145)
TOTAL PROTEIN: 7.7 g/dL (ref 6.5–8.1)

## 2017-12-13 NOTE — Telephone Encounter (Signed)
Appts already scheduled per 10/8 los. No additional appts added.

## 2017-12-13 NOTE — Telephone Encounter (Signed)
Printed avs and calender of upcoming appointment. Per 10/8 los.

## 2017-12-13 NOTE — Progress Notes (Signed)
[facility] [phone]   [phone]  OFFICE PROGRESS NOTE  [place], [place], [place] [address]  DIAGNOSIS: Stage IIB (T3, N0, M0) non-small cell lung cancer, invasive well-differentiated squamous cell carcinoma presented with right middle lobe lung mass.  PRIOR THERAPY: Status post right VATS, right middle lobectomy with mediastinal lymph node sampling under the care of Dr. Roxan Hockey on 09/19/2017.  The tumor measured 4.2 cm but the carcinoma extends through the visceral pleura.  CURRENT THERAPY: Adjuvant systemic chemotherapy with carboplatin for AUC of 6 and paclitaxel 200 mg/M2 every 3 weeks.  First dose 11/16/2017.  Status post 1 cycle.  INTERVAL HISTORY: Danny Duke 82 y.o. male returns to the clinic today for follow-up visit accompanied by his wife.  The patient tolerated the first cycle of his treatment with carboplatin and paclitaxel fairly well except for the aching pain and fatigue after the Neulasta injection.  He was treated with Claritin and Tylenol with some mild improvement of his symptoms.  He denied having any current chest pain, shortness of breath, cough or hemoptysis.  He denied having any fever or chills.  He has no nausea, vomiting, diarrhea or constipation.  The patient is here today for evaluation before starting cycle #2 of his treatment.   MEDICAL HISTORY: Past Medical History:  Diagnosis Date  . Cancer [facility])    possible lung cancer  . Chronic back pain   . Hypercholesterolemia   . Hypertension     ALLERGIES:  has No Known Allergies.  MEDICATIONS:  Current Outpatient Medications  Medication Sig Dispense Refill  . amLODipine (NORVASC) 10 MG tablet Take 10 mg by mouth daily.    Marland Kitchen aspirin EC 81 MG tablet Take 81 mg by mouth daily.    . chlorproMAZINE (THORAZINE) 25 MG tablet Take 1 tablet (25 mg total) by mouth 3 (three) times daily as needed. 30 tablet 0  . lisinopril  (PRINIVIL,ZESTRIL) 40 MG tablet Take 40 mg by mouth daily.  1  . metoprolol succinate (TOPROL-XL) 25 MG 24 hr tablet Take 1 tablet (25 mg total) by mouth daily. 60 tablet 1  . Oxycodone HCl 20 MG TABS Take 1 tablet by mouth every 6 (six) hours as needed.  0  . prochlorperazine (COMPAZINE) 10 MG tablet Take 1 tablet (10 mg total) by mouth every 6 (six) hours as needed for nausea or vomiting. 30 tablet 0  . simvastatin (ZOCOR) 20 MG tablet Take 20 mg by mouth daily.    . tadalafil (CIALIS) 5 MG tablet Take 5 mg by mouth daily.  12   No current facility-administered medications for this visit.     SURGICAL HISTORY:  Past Surgical History:  Procedure Laterality Date  . BRONCHIAL NEEDLE ASPIRATION BIOPSY  08/05/2017   Procedure: BRONCHIAL NEEDLE ASPIRATION BIOPSIES;  Surgeon: Juanito Doom, MD;  Location[facility];  Service: Cardiopulmonary;;  . ENDOBRONCHIAL ULTRASOUND Bilateral 08/05/2017   Procedure: ENDOBRONCHIAL ULTRASOUND;  Surgeon: Juanito Doom, MD;  [facility];  Service: Cardiopulmonary;  Laterality: Bilateral;  . MEDIASTINOSCOPY N/A 09/19/2017   Procedure: MEDIASTINOSCOPY;  Surgeon: [name], MD;  Location[facility] OR;  Service: Thoracic;  Laterality: N/A;  . MULTIPLE TOOTH EXTRACTIONS    . VIDEO ASSISTED THORACOSCOPY (VATS)/ LOBECTOMY Right 09/19/2017   Procedure: RIGHT VIDEO ASSISTED THORACOSCOPY (VATS)/ LOBECTOMY;  Surgeon: [name], MD;  Location: [facility];  Service: Thoracic;  Laterality: Right;    REVIEW OF SYSTEMS:  A comprehensive review of systems was negative except for: Constitutional: positive for fatigue   PHYSICAL EXAMINATION: General appearance: alert, cooperative, fatigued and no distress Head: Normocephalic, without obvious abnormality, atraumatic Neck: no adenopathy, no JVD, supple, symmetrical, trachea midline and thyroid not enlarged, symmetric, no tenderness/mass/nodules Lymph nodes: Cervical, supraclavicular, and axillary  nodes normal. Resp: clear to auscultation bilaterally Back: symmetric, no curvature. ROM normal. No CVA tenderness. Cardio: regular rate and rhythm, S1, S2 normal, no murmur, click, rub or gallop GI: soft, non-tender; bowel sounds normal; no masses,  no organomegaly Extremities: extremities normal, atraumatic, no cyanosis or edema  ECOG PERFORMANCE STATUS: 1 - Symptomatic but completely ambulatory  Blood pressure 131/74, pulse 74, temperature 98.6 F (37 C), temperature source Oral, resp. rate 18, height 5\' 8"  (1.727 m), weight 156 lb 11.2 oz (71.1 kg), SpO2 98 %.  LABORATORY DATA: Lab Results  Component Value Date   WBC 12.1 (H) 12/13/2017   HGB 11.6 (L) 12/13/2017   HCT 36.1 (L) 12/13/2017   MCV 95.0 12/13/2017   PLT 251 12/13/2017      Chemistry      Component Value Date/Time   NA 141 12/13/2017 1259   K 4.3 12/13/2017 1259   CL 108 12/13/2017 1259   CO2 25 12/13/2017 1259   BUN 11 12/13/2017 1259   CREATININE 1.39 (H) 12/13/2017 1259      Component Value Date/Time   CALCIUM 9.3 12/13/2017 1259   ALKPHOS 94 12/13/2017 1259   AST 18 12/13/2017 1259   ALT 15 12/13/2017 1259   BILITOT 0.3 12/13/2017 1259       RADIOGRAPHIC STUDIES: Dg Chest 2 View  Result Date: 11/29/2017 CLINICAL DATA:  History of right lobectomy in July 2015 for squamous cell lung malignancy. EXAM: CHEST - 2 VIEW COMPARISON:  PA and lateral chest x-ray of October 25, 2017 FINDINGS: The left lung is well-expanded. There is stable apical pleural thickening and patchy density in the apex. There is persistent increased density in the left infrahilar region. There is shift of the mediastinum toward the right which is stable. Patchy interstitial density in the right upper, mid, and lower lung is present with blunting of the lateral and posterior costophrenic angles consistent with small pleural effusion. The heart is normal in size. There is calcification in the wall of the thoracic aorta. There is multilevel  degenerative disc disease of the thoracic spine. IMPRESSION: Stable pleuroparenchymal changes bilaterally. Small right pleural effusion. Stable mediastinal shift toward the right. No acute pneumonia nor obvious progression of disease. Thoracic aortic atherosclerosis. Electronically Signed   By: David  Martinique M.D.   On: 11/29/2017 10:35    ASSESSMENT AND PLAN: This is a very pleasant 82 years old African-American male recently diagnosed with stage IIB (T3, N0, M0) invasive well-differentiated squamous cell carcinoma presented with right middle lobe lung mass status post right middle lobectomy with lymph node sampling on September 19, 2017 under the care of Dr. Roxan Hockey. The patient is currently undergoing adjuvant treatment with systemic chemotherapy with carboplatin for AUC of 6 and paclitaxel 200 mg/M2 every 3 weeks with Neulasta support status post 1 cycle. He tolerated the first cycle of his treatment well except for fatigue after the Neulasta injection. I recommended for the patient to proceed with cycle #2 of his treatment tomorrow as a schedule. He will come back for follow-up visit in 3 weeks for evaluation before starting cycle #3. The patient was advised to call immediately if he has any concerning symptoms in  the interval. The patient voices understanding of current disease status and treatment options and is in agreement with the current care plan.  All questions were answered. The patient knows to call the clinic with any problems, questions or concerns. We can certainly see the patient much sooner if necessary.  I spent 10 minutes counseling the patient face to face. The total time spent in the appointment was 15 minutes.  Disclaimer: This note was dictated with voice recognition software. Similar sounding words can inadvertently be transcribed and may not be corrected upon review.

## 2017-12-14 ENCOUNTER — Inpatient Hospital Stay: Payer: Medicare Other

## 2017-12-14 VITALS — BP 123/75 | HR 74 | Temp 98.4°F | Resp 17

## 2017-12-14 DIAGNOSIS — C3491 Malignant neoplasm of unspecified part of right bronchus or lung: Secondary | ICD-10-CM

## 2017-12-14 DIAGNOSIS — C342 Malignant neoplasm of middle lobe, bronchus or lung: Secondary | ICD-10-CM | POA: Diagnosis not present

## 2017-12-14 MED ORDER — SODIUM CHLORIDE 0.9 % IV SOLN
Freq: Once | INTRAVENOUS | Status: AC
  Administered 2017-12-14: 09:00:00 via INTRAVENOUS
  Filled 2017-12-14: qty 5

## 2017-12-14 MED ORDER — DIPHENHYDRAMINE HCL 50 MG/ML IJ SOLN
INTRAMUSCULAR | Status: AC
  Filled 2017-12-14: qty 1

## 2017-12-14 MED ORDER — PALONOSETRON HCL INJECTION 0.25 MG/5ML
INTRAVENOUS | Status: AC
  Filled 2017-12-14: qty 5

## 2017-12-14 MED ORDER — DIPHENHYDRAMINE HCL 50 MG/ML IJ SOLN
50.0000 mg | Freq: Once | INTRAMUSCULAR | Status: AC
  Administered 2017-12-14: 50 mg via INTRAVENOUS

## 2017-12-14 MED ORDER — FAMOTIDINE IN NACL 20-0.9 MG/50ML-% IV SOLN
20.0000 mg | Freq: Once | INTRAVENOUS | Status: AC
  Administered 2017-12-14: 20 mg via INTRAVENOUS

## 2017-12-14 MED ORDER — SODIUM CHLORIDE 0.9 % IV SOLN
400.0000 mg | Freq: Once | INTRAVENOUS | Status: AC
  Administered 2017-12-14: 400 mg via INTRAVENOUS
  Filled 2017-12-14: qty 40

## 2017-12-14 MED ORDER — SODIUM CHLORIDE 0.9 % IV SOLN
200.0000 mg/m2 | Freq: Once | INTRAVENOUS | Status: AC
  Administered 2017-12-14: 366 mg via INTRAVENOUS
  Filled 2017-12-14: qty 61

## 2017-12-14 MED ORDER — FAMOTIDINE IN NACL 20-0.9 MG/50ML-% IV SOLN
INTRAVENOUS | Status: AC
  Filled 2017-12-14: qty 50

## 2017-12-14 MED ORDER — PALONOSETRON HCL INJECTION 0.25 MG/5ML
0.2500 mg | Freq: Once | INTRAVENOUS | Status: AC
  Administered 2017-12-14: 0.25 mg via INTRAVENOUS

## 2017-12-14 MED ORDER — SODIUM CHLORIDE 0.9 % IV SOLN
Freq: Once | INTRAVENOUS | Status: AC
  Administered 2017-12-14: 08:00:00 via INTRAVENOUS
  Filled 2017-12-14: qty 250

## 2017-12-14 NOTE — Patient Instructions (Signed)
[facility] Discharge Instructions for Patients Receiving Chemotherapy  Today you received the following chemotherapy agents Paclitaxel (Taxol) & Carboplatin (Paraplatin).  To help prevent nausea and vomiting after your treatment, we encourage you to take your nausea medication as prescribed.  If you develop nausea and vomiting that is not controlled by your nausea medication, call the clinic.   BELOW ARE SYMPTOMS THAT SHOULD BE REPORTED IMMEDIATELY:  *FEVER GREATER THAN 100.5 F  *CHILLS WITH OR WITHOUT FEVER  NAUSEA AND VOMITING THAT IS NOT CONTROLLED WITH YOUR NAUSEA MEDICATION  *UNUSUAL SHORTNESS OF BREATH  *UNUSUAL BRUISING OR BLEEDING  TENDERNESS IN MOUTH AND THROAT WITH OR WITHOUT PRESENCE OF ULCERS  *URINARY PROBLEMS  *BOWEL PROBLEMS  UNUSUAL RASH Items with * indicate a potential emergency and should be followed up as soon as possible.  Feel free to call the clinic should you have any questions or concerns. The clinic phone number is [phone].  Please show the [place] at check-in to the Emergency Department and triage nurse.

## 2017-12-15 ENCOUNTER — Emergency Department (HOSPITAL_COMMUNITY)
Admission: EM | Admit: 2017-12-15 | Discharge: 2017-12-16 | Disposition: A | Discharge status: Home / Self Care | Payer: Medicare Other | Attending: Emergency Medicine | Admitting: Emergency Medicine

## 2017-12-15 ENCOUNTER — Emergency Department (HOSPITAL_COMMUNITY): Payer: Medicare Other

## 2017-12-15 ENCOUNTER — Other Ambulatory Visit: Payer: Self-pay

## 2017-12-15 ENCOUNTER — Encounter (HOSPITAL_COMMUNITY): Payer: Self-pay

## 2017-12-15 DIAGNOSIS — Z79899 Other long term (current) drug therapy: Secondary | ICD-10-CM | POA: Insufficient documentation

## 2017-12-15 DIAGNOSIS — Z87891 Personal history of nicotine dependence: Secondary | ICD-10-CM | POA: Insufficient documentation

## 2017-12-15 DIAGNOSIS — R0789 Other chest pain: Secondary | ICD-10-CM | POA: Insufficient documentation

## 2017-12-15 DIAGNOSIS — Z7982 Long term (current) use of aspirin: Secondary | ICD-10-CM | POA: Insufficient documentation

## 2017-12-15 DIAGNOSIS — I10 Essential (primary) hypertension: Secondary | ICD-10-CM | POA: Insufficient documentation

## 2017-12-15 DIAGNOSIS — R1013 Epigastric pain: Secondary | ICD-10-CM | POA: Diagnosis not present

## 2017-12-15 NOTE — ED Triage Notes (Signed)
Pt here from home for evaluation of chest pain and upper abdominal pain. Denies fevers or chills.  Cancer patient and on dialysis once every 3 weeks.  Had dialysis yesterday.  A&Ox4.

## 2017-12-16 ENCOUNTER — Inpatient Hospital Stay: Payer: Medicare Other

## 2017-12-16 DIAGNOSIS — C3491 Malignant neoplasm of unspecified part of right bronchus or lung: Secondary | ICD-10-CM

## 2017-12-16 DIAGNOSIS — C342 Malignant neoplasm of middle lobe, bronchus or lung: Secondary | ICD-10-CM | POA: Diagnosis not present

## 2017-12-16 LAB — COMPREHENSIVE METABOLIC PANEL
ALBUMIN: 3.1 g/dL — AB (ref 3.5–5.0)
ALT: 29 U/L (ref 0–44)
ANION GAP: 8 (ref 5–15)
AST: 56 U/L — ABNORMAL HIGH (ref 15–41)
Alkaline Phosphatase: 80 U/L (ref 38–126)
BUN: 14 mg/dL (ref 8–23)
CO2: 22 mmol/L (ref 22–32)
Calcium: 9 mg/dL (ref 8.9–10.3)
Chloride: 106 mmol/L (ref 98–111)
Creatinine, Ser: 1.3 mg/dL — ABNORMAL HIGH (ref 0.61–1.24)
GFR calc non Af Amer: 50 mL/min — ABNORMAL LOW (ref >60)
GFR, EST AFRICAN AMERICAN: 58 mL/min — AB (ref >60)
GLUCOSE: 118 mg/dL — AB (ref 70–99)
POTASSIUM: 3.9 mmol/L (ref 3.5–5.1)
Sodium: 136 mmol/L (ref 135–145)
Total Bilirubin: 0.5 mg/dL (ref 0.3–1.2)
Total Protein: 7.3 g/dL (ref 6.5–8.1)

## 2017-12-16 LAB — CBC
HCT: 36.3 % — ABNORMAL LOW (ref 39.0–52.0)
Hemoglobin: 11.3 g/dL — ABNORMAL LOW (ref 13.0–17.0)
MCH: 29.5 pg (ref 26.0–34.0)
MCHC: 31.1 g/dL (ref 30.0–36.0)
MCV: 94.8 fL (ref 80.0–100.0)
NRBC: 0 % (ref 0.0–0.2)
PLATELETS: 281 10*3/uL (ref 150–400)
RBC: 3.83 MIL/uL — AB (ref 4.22–5.81)
RDW: 14.3 % (ref 11.5–15.5)
WBC: 8.4 10*3/uL (ref 4.0–10.5)

## 2017-12-16 LAB — I-STAT TROPONIN, ED
TROPONIN I, POC: 0 ng/mL (ref 0.00–0.08)
TROPONIN I, POC: 0.01 ng/mL (ref 0.00–0.08)

## 2017-12-16 LAB — LIPASE, BLOOD: Lipase: 29 U/L (ref 11–51)

## 2017-12-16 MED ORDER — PEGFILGRASTIM-CBQV 6 MG/0.6ML ~~LOC~~ SOSY
PREFILLED_SYRINGE | SUBCUTANEOUS | Status: AC
  Filled 2017-12-16: qty 0.6

## 2017-12-16 MED ORDER — PEGFILGRASTIM-CBQV 6 MG/0.6ML ~~LOC~~ SOSY
6.0000 mg | PREFILLED_SYRINGE | Freq: Once | SUBCUTANEOUS | Status: AC
  Administered 2017-12-16: 6 mg via SUBCUTANEOUS

## 2017-12-16 NOTE — ED Provider Notes (Signed)
[facility] EMERGENCY DEPARTMENT Provider Note   CSN: [id number] Arrival date & time: 12/15/17  2318     History   Chief Complaint Chief Complaint  Patient presents with  . Chest Pain    HPI Danny Duke is a 82 y.o. male.  Patient presents to the emergency department for evaluation of abdominal and chest pain.  Patient reports that symptoms began after he went to bed tonight.  Around 1030 he started having dull aching pain in his upper abdomen.  This was continuous.  He had mild nausea but no vomiting.  No diarrhea or constipation.  Patient reports that the pain was persistent so he came to the ER.  After he came to the ER the pain spontaneously resolved.  It was present for approximately 45 minutes total.  He is now completely symptom-free and back to his normal baseline.  He has never had similar symptoms previously.       Past Medical History:  Diagnosis Date  . Cancer [facility])    possible lung cancer  . Chronic back pain   . Hypercholesterolemia   . Hypertension     Patient Active Problem List   Diagnosis Date Noted  . Goals of care, counseling/discussion 12/13/2017  . Encounter for antineoplastic chemotherapy 12/13/2017  . Status post surgery 09/19/2017  . S/P lobectomy of lung 09/19/2017  . Squamous cell carcinoma lung, right ([place])   . COPD GOLD I  07/13/2017  . Essential hypertension 07/13/2017  . Mass of middle lobe of right lung 07/12/2017    Past Surgical History:  Procedure Laterality Date  . BRONCHIAL NEEDLE ASPIRATION BIOPSY  08/05/2017   Procedure: BRONCHIAL NEEDLE ASPIRATION BIOPSIES;  Surgeon: Juanito Doom, MD;  [facility];  Service: Cardiopulmonary;;  . ENDOBRONCHIAL ULTRASOUND Bilateral 08/05/2017   Procedure: ENDOBRONCHIAL ULTRASOUND;  Surgeon: Juanito Doom, MD;  [facility];  Service: Cardiopulmonary;  Laterality: Bilateral;  . MEDIASTINOSCOPY N/A 09/19/2017   Procedure: MEDIASTINOSCOPY;   Surgeon: [name], MD;  Location: [facility] OR;  Service: Thoracic;  Laterality: N/A;  . MULTIPLE TOOTH EXTRACTIONS    . VIDEO ASSISTED THORACOSCOPY (VATS)/ LOBECTOMY Right 09/19/2017   Procedure: RIGHT VIDEO ASSISTED THORACOSCOPY (VATS)/ LOBECTOMY;  Surgeon: [name], MD;  Location: [facility];  Service: Thoracic;  Laterality: Right;        Home Medications    Prior to Admission medications   Medication Sig Start Date End Date Taking? Authorizing Provider  amLODipine (NORVASC) 10 MG tablet Take 10 mg by mouth daily.    [provider]  aspirin EC 81 MG tablet Take 81 mg by mouth daily.    [provider]  chlorproMAZINE (THORAZINE) 25 MG tablet Take 1 tablet (25 mg total) by mouth 3 (three) times daily as needed. 12/01/17   Maryanna Shape, NP  lisinopril (PRINIVIL,ZESTRIL) 40 MG tablet Take 40 mg by mouth daily. 10/31/17   [provider]  metoprolol succinate (TOPROL-XL) 25 MG 24 hr tablet Take 1 tablet (25 mg total) by mouth daily. 09/26/17   Gold, Wilder Glade, PA-C  Oxycodone HCl 20 MG TABS Take 1 tablet by mouth every 6 (six) hours as needed. 10/31/17   [provider]  prochlorperazine (COMPAZINE) 10 MG tablet Take 1 tablet (10 mg total) by mouth every 6 (six) hours as needed for nausea or vomiting. 11/10/17   Curt Bears, MD  simvastatin (ZOCOR) 20 MG tablet Take 20 mg by mouth daily.    [provider]  tadalafil (CIALIS) 5 MG tablet Take 5 mg by mouth daily. 10/25/17   [provider]    Family History Family History  Problem Relation Age of Onset  . Colon cancer Brother   . Stomach cancer Brother     Social History Social History   Tobacco Use  . Smoking status: Former Smoker    Packs/day: 0.50    Years: 40.00    Pack years: 20.00    Last attempt to quit: 03/08/1997    Years since quitting: 20.7  . Smokeless tobacco: Never Used  Substance Use Topics  . Alcohol use: No  . Drug use: No     Allergies    Patient has no known allergies.   Review of Systems Review of Systems  Gastrointestinal: Positive for abdominal pain and nausea.  All other systems reviewed and are negative.    Physical Exam Updated Vital Signs BP 113/72   Pulse (!) 59   Temp 98.3 F (36.8 C)   Resp 13   Ht 5\' 8"  (1.727 m)   Wt 70.8 kg   SpO2 100%   BMI 23.72 kg/m   Physical Exam  Constitutional: He is oriented to person, place, and time. He appears well-developed and well-nourished. No distress.  HENT:  Head: Normocephalic and atraumatic.  Right Ear: Hearing normal.  Left Ear: Hearing normal.  Nose: Nose normal.  Mouth/Throat: Oropharynx is clear and moist and mucous membranes are normal.  Eyes: Pupils are equal, round, and reactive to light. Conjunctivae and EOM are normal.  Neck: Normal range of motion. Neck supple.  Cardiovascular: Regular rhythm, S1 normal and S2 normal. Exam reveals no gallop and no friction rub.  No murmur heard. Pulmonary/Chest: Effort normal and breath sounds normal. No respiratory distress. He exhibits no tenderness.  Abdominal: Soft. Normal appearance and bowel sounds are normal. There is no hepatosplenomegaly. There is no tenderness. There is no rebound, no guarding, no tenderness at McBurney's point and negative Murphy's sign. No hernia.  Musculoskeletal: Normal range of motion.  Neurological: He is alert and oriented to person, place, and time. He has normal strength. No cranial nerve deficit or sensory deficit. Coordination normal. GCS eye subscore is 4. GCS verbal subscore is 5. GCS motor subscore is 6.  Skin: Skin is warm, dry and intact. No rash noted. No cyanosis.  Psychiatric: He has a normal mood and affect. His speech is normal and behavior is normal. Thought content normal.  Nursing note and vitals reviewed.    ED Treatments / Results  Labs (all labs ordered are listed, but only abnormal results are displayed) Labs Reviewed  CBC - Abnormal; Notable for the  following components:      Result Value   RBC 3.83 (*)    Hemoglobin 11.3 (*)    HCT 36.3 (*)    All other components within normal limits  COMPREHENSIVE METABOLIC PANEL - Abnormal; Notable for the following components:   Glucose, Bld 118 (*)    Creatinine, Ser 1.30 (*)    Albumin 3.1 (*)    AST 56 (*)    GFR calc non Af Amer 50 (*)    GFR calc Af Amer 58 (*)    All other components within normal limits  LIPASE, BLOOD  I-STAT TROPONIN, ED  I-STAT TROPONIN, ED    EKG None   ED ECG REPORT   Date: 12/16/2017  Rate: 86  Rhythm: normal sinus rhythm  QRS Axis: normal  Intervals: normal  ST/T  Wave abnormalities: nonspecific T wave changes  Conduction Disutrbances:none  Narrative Interpretation:   Old EKG Reviewed: unchanged  I have personally reviewed the EKG tracing and agree with the computerized printout as noted.   Radiology Dg Chest 2 View  Result Date: 12/16/2017 CLINICAL DATA:  Chest pain EXAM: CHEST - 2 VIEW COMPARISON:  11/29/2017 FINDINGS: Unchanged appearance of scarring/atelectasis at the right lung base and right mid lung. Postsurgical changes of the right lung. No focal airspace consolidation or pulmonary edema. IMPRESSION: No active cardiopulmonary disease. Electronically Signed   By: Ulyses Jarred M.D.   On: 12/16/2017 00:35    Procedures Procedures (including critical care time)  Medications Ordered in ED Medications - No data to display   Initial Impression / Assessment and Plan / ED Course  I have reviewed the triage vital signs and the nursing notes.  Pertinent labs & imaging results that were available during my care of the patient were reviewed by me and considered in my medical decision making (see chart for details).     Patient presented to the ER for evaluation of abdominal discomfort.  Symptoms began suddenly while he was lying in bed.  He had an aching pain, like a toothache, for approximately 45 minutes in the center of his upper  abdomen.  This pain spontaneously resolved and did not recur.  His abdominal exam is benign, nontender.  I cannot reproduce the pain here in the ER.  He did not have associated chest pain.  Cardiac work-up unremarkable and cardiac etiology felt to be unlikely.  As the pain spontaneously resolved, doubt aortic dissection/AAA, etc.  Abdominal exam is essentially normal at this time, does not require imaging of the abdomen.  Patient monitored for a period of time, no occurrence of the pain.  We will have him follow-up with PCP, return to the ER if he has any recurrent symptoms.  Final Clinical Impressions(s) / ED Diagnoses   Final diagnoses:  Atypical chest pain  Epigastric pain    ED Discharge Orders    None       Orpah Greek, MD 12/16/17 [phone]

## 2017-12-16 NOTE — ED Notes (Signed)
Pt verbalizes understanding of d/c instructions. Pt ambulatory at d/c with all belongings and with family.   

## 2017-12-19 ENCOUNTER — Other Ambulatory Visit: Payer: Self-pay | Admitting: Medical Oncology

## 2017-12-19 ENCOUNTER — Telehealth: Payer: Self-pay | Admitting: *Deleted

## 2017-12-19 DIAGNOSIS — I878 Other specified disorders of veins: Secondary | ICD-10-CM

## 2017-12-19 NOTE — Telephone Encounter (Signed)
TC received from pt's wife. She stated that she and her husband would like to have a port placed for his future chemo treatments as his veins have been difficult to access.  His next treatment is for 01/05/18. They would like to have it placed before that appt.

## 2017-12-22 ENCOUNTER — Inpatient Hospital Stay: Payer: Medicare Other

## 2017-12-22 DIAGNOSIS — C3491 Malignant neoplasm of unspecified part of right bronchus or lung: Secondary | ICD-10-CM

## 2017-12-22 DIAGNOSIS — C342 Malignant neoplasm of middle lobe, bronchus or lung: Secondary | ICD-10-CM | POA: Diagnosis not present

## 2017-12-22 LAB — CBC WITH DIFFERENTIAL (CANCER CENTER ONLY)
Abs Immature Granulocytes: 0.59 10*3/uL — ABNORMAL HIGH (ref 0.00–0.07)
BASOS ABS: 0 10*3/uL (ref 0.0–0.1)
Basophils Relative: 1 %
EOS PCT: 3 %
Eosinophils Absolute: 0.2 10*3/uL (ref 0.0–0.5)
HEMATOCRIT: 34.8 % — AB (ref 39.0–52.0)
HEMOGLOBIN: 11.2 g/dL — AB (ref 13.0–17.0)
Immature Granulocytes: 11 %
LYMPHS ABS: 1.6 10*3/uL (ref 0.7–4.0)
Lymphocytes Relative: 29 %
MCH: 30.4 pg (ref 26.0–34.0)
MCHC: 32.2 g/dL (ref 30.0–36.0)
MCV: 94.3 fL (ref 80.0–100.0)
MONO ABS: 1 10*3/uL (ref 0.1–1.0)
Monocytes Relative: 18 %
NRBC: 0 % (ref 0.0–0.2)
Neutro Abs: 2.2 10*3/uL (ref 1.7–7.7)
Neutrophils Relative %: 38 %
Platelet Count: 98 10*3/uL — ABNORMAL LOW (ref 150–400)
RBC: 3.69 MIL/uL — AB (ref 4.22–5.81)
RDW: 14.1 % (ref 11.5–15.5)
WBC: 5.6 10*3/uL (ref 4.0–10.5)

## 2017-12-22 LAB — CMP (CANCER CENTER ONLY)
ALK PHOS: 133 U/L — AB (ref 38–126)
ALT: 99 U/L — AB (ref 0–44)
AST: 47 U/L — ABNORMAL HIGH (ref 15–41)
Albumin: 3.3 g/dL — ABNORMAL LOW (ref 3.5–5.0)
Anion gap: 9 (ref 5–15)
BUN: 14 mg/dL (ref 8–23)
CO2: 25 mmol/L (ref 22–32)
CREATININE: 1.41 mg/dL — AB (ref 0.61–1.24)
Calcium: 9.7 mg/dL (ref 8.9–10.3)
Chloride: 105 mmol/L (ref 98–111)
GFR, EST AFRICAN AMERICAN: 53 mL/min — AB (ref >60)
GFR, EST NON AFRICAN AMERICAN: 45 mL/min — AB (ref >60)
Glucose, Bld: 105 mg/dL — ABNORMAL HIGH (ref 70–99)
Potassium: 4.4 mmol/L (ref 3.5–5.1)
Sodium: 139 mmol/L (ref 135–145)
Total Bilirubin: 0.4 mg/dL (ref 0.3–1.2)
Total Protein: 7.6 g/dL (ref 6.5–8.1)

## 2017-12-26 ENCOUNTER — Other Ambulatory Visit: Payer: Self-pay | Admitting: Student

## 2017-12-26 ENCOUNTER — Telehealth: Payer: Self-pay | Admitting: *Deleted

## 2017-12-26 NOTE — Telephone Encounter (Signed)
Pt wife called states " Danny Duke is having problems and says it hurts to walk/burning sensation, aching all over after the shot, constipation" Discussed with Danny Duke side effects of the chemo and Udenyca. Encouraged pt to eat prunes,raisins, apple juice, prune juice, to help with constipation, increase fluids. Joint will ache after injection, a SE that will probably not go away. For the feet burning and difficulty walking, encouraged pt to continue to monitor symptoms, use assistive devices when ambulating. Message forward to provider for review.

## 2017-12-27 ENCOUNTER — Ambulatory Visit (HOSPITAL_COMMUNITY)
Admission: RE | Admit: 2017-12-27 | Discharge: 2017-12-27 | Disposition: A | Discharge status: Home / Self Care | Payer: Medicare Other | Source: Ambulatory Visit | Attending: Internal Medicine | Admitting: Internal Medicine

## 2017-12-27 ENCOUNTER — Other Ambulatory Visit: Payer: Self-pay | Admitting: Internal Medicine

## 2017-12-27 ENCOUNTER — Encounter (HOSPITAL_COMMUNITY): Payer: Self-pay | Admitting: Interventional Radiology

## 2017-12-27 ENCOUNTER — Other Ambulatory Visit: Payer: Self-pay

## 2017-12-27 DIAGNOSIS — E7800 Pure hypercholesterolemia, unspecified: Secondary | ICD-10-CM | POA: Insufficient documentation

## 2017-12-27 DIAGNOSIS — Z7982 Long term (current) use of aspirin: Secondary | ICD-10-CM | POA: Diagnosis not present

## 2017-12-27 DIAGNOSIS — C3491 Malignant neoplasm of unspecified part of right bronchus or lung: Secondary | ICD-10-CM | POA: Diagnosis not present

## 2017-12-27 DIAGNOSIS — Z902 Acquired absence of lung [part of]: Secondary | ICD-10-CM | POA: Diagnosis not present

## 2017-12-27 DIAGNOSIS — Z79899 Other long term (current) drug therapy: Secondary | ICD-10-CM | POA: Insufficient documentation

## 2017-12-27 DIAGNOSIS — I878 Other specified disorders of veins: Secondary | ICD-10-CM

## 2017-12-27 DIAGNOSIS — Z9889 Other specified postprocedural states: Secondary | ICD-10-CM | POA: Insufficient documentation

## 2017-12-27 DIAGNOSIS — I10 Essential (primary) hypertension: Secondary | ICD-10-CM | POA: Insufficient documentation

## 2017-12-27 HISTORY — PX: IR IMAGING GUIDED PORT INSERTION: IMG5740

## 2017-12-27 LAB — CBC
HCT: 37.2 % — ABNORMAL LOW (ref 39.0–52.0)
Hemoglobin: 11.5 g/dL — ABNORMAL LOW (ref 13.0–17.0)
MCH: 30 pg (ref 26.0–34.0)
MCHC: 30.9 g/dL (ref 30.0–36.0)
MCV: 97.1 fL (ref 80.0–100.0)
NRBC: 0.2 % (ref 0.0–0.2)
PLATELETS: 121 10*3/uL — AB (ref 150–400)
RBC: 3.83 MIL/uL — ABNORMAL LOW (ref 4.22–5.81)
RDW: 15.9 % — AB (ref 11.5–15.5)
WBC: 20.9 10*3/uL — AB (ref 4.0–10.5)

## 2017-12-27 LAB — PROTIME-INR
INR: 0.94
PROTHROMBIN TIME: 12.5 s (ref 11.4–15.2)

## 2017-12-27 LAB — APTT: APTT: 33 s (ref 24–36)

## 2017-12-27 MED ORDER — LIDOCAINE-EPINEPHRINE (PF) 2 %-1:200000 IJ SOLN
INTRAMUSCULAR | Status: AC
  Filled 2017-12-27: qty 20

## 2017-12-27 MED ORDER — CEFAZOLIN SODIUM-DEXTROSE 2-4 GM/100ML-% IV SOLN
2.0000 g | Freq: Once | INTRAVENOUS | Status: AC
  Administered 2017-12-27: 2 g via INTRAVENOUS

## 2017-12-27 MED ORDER — MIDAZOLAM HCL 2 MG/2ML IJ SOLN
INTRAMUSCULAR | Status: AC | PRN
  Administered 2017-12-27 (×2): 1 mg via INTRAVENOUS

## 2017-12-27 MED ORDER — HEPARIN SOD (PORK) LOCK FLUSH 100 UNIT/ML IV SOLN
INTRAVENOUS | Status: AC | PRN
  Administered 2017-12-27: 500 [IU] via INTRAVENOUS

## 2017-12-27 MED ORDER — LIDOCAINE HCL (PF) 1 % IJ SOLN
INTRAMUSCULAR | Status: AC | PRN
  Administered 2017-12-27: 5 mL

## 2017-12-27 MED ORDER — LIDOCAINE-EPINEPHRINE (PF) 2 %-1:200000 IJ SOLN
INTRAMUSCULAR | Status: AC | PRN
  Administered 2017-12-27: 10 mL

## 2017-12-27 MED ORDER — FENTANYL CITRATE (PF) 100 MCG/2ML IJ SOLN
INTRAMUSCULAR | Status: AC
  Filled 2017-12-27: qty 2

## 2017-12-27 MED ORDER — SODIUM CHLORIDE 0.9 % IV SOLN
INTRAVENOUS | Status: DC
  Administered 2017-12-27: 12:00:00 via INTRAVENOUS

## 2017-12-27 MED ORDER — MIDAZOLAM HCL 2 MG/2ML IJ SOLN
INTRAMUSCULAR | Status: AC
  Filled 2017-12-27: qty 4

## 2017-12-27 MED ORDER — LIDOCAINE HCL 1 % IJ SOLN
INTRAMUSCULAR | Status: AC
  Filled 2017-12-27: qty 20

## 2017-12-27 MED ORDER — FENTANYL CITRATE (PF) 100 MCG/2ML IJ SOLN
INTRAMUSCULAR | Status: AC | PRN
  Administered 2017-12-27 (×2): 50 ug via INTRAVENOUS

## 2017-12-27 MED ORDER — CEFAZOLIN SODIUM-DEXTROSE 2-4 GM/100ML-% IV SOLN
INTRAVENOUS | Status: AC
  Filled 2017-12-27: qty 100

## 2017-12-27 MED ORDER — HEPARIN SOD (PORK) LOCK FLUSH 100 UNIT/ML IV SOLN
INTRAVENOUS | Status: AC
  Filled 2017-12-27: qty 5

## 2017-12-27 NOTE — Procedures (Signed)
Lung ca  S/p RT IJ PORT  TIP SVCRA NO COMP STABLE EBL 0 READY FOR USE FULL REPORT IN PACS

## 2017-12-27 NOTE — H&P (Signed)
Referring Physician(s): Mohamed,Mohamed  Supervising Physician: Daryll Brod  Patient Status:  [facility]  Chief Complaint:  "I'm getting a port"   Subjective: Patient familiar to IR service from prior right lung mass biopsy on 07/29/2017.  He has a history of stage IIIb squamous cell carcinoma of the right lung with prior right VATS/right middle lobectomy/mediastinal lymph node sampling on 09/19/2017.  He is currently receiving systemic chemotherapy.  He has poor venous access and presents today for Port-A-Cath placement.  He currently denies fever, headache, worsening chest pain/dyspnea, cough, abdominal/back pain, nausea, vomiting or bleeding.  Past Medical History:  Diagnosis Date  . Cancer [facility])    possible lung cancer  . Chronic back pain   . Hypercholesterolemia   . Hypertension    Past Surgical History:  Procedure Laterality Date  . BRONCHIAL NEEDLE ASPIRATION BIOPSY  08/05/2017   Procedure: BRONCHIAL NEEDLE ASPIRATION BIOPSIES;  Surgeon: Juanito Doom, MD;  [facility];  Service: Cardiopulmonary;;  . ENDOBRONCHIAL ULTRASOUND Bilateral 08/05/2017   Procedure: ENDOBRONCHIAL ULTRASOUND;  Surgeon: Juanito Doom, MD;  [facility];  Service: Cardiopulmonary;  Laterality: Bilateral;  . MEDIASTINOSCOPY N/A 09/19/2017   Procedure: MEDIASTINOSCOPY;  Surgeon: [name], MD;  Location: [facility] OR;  Service: Thoracic;  Laterality: N/A;  . MULTIPLE TOOTH EXTRACTIONS    . VIDEO ASSISTED THORACOSCOPY (VATS)/ LOBECTOMY Right 09/19/2017   Procedure: RIGHT VIDEO ASSISTED THORACOSCOPY (VATS)/ LOBECTOMY;  Surgeon: [name], MD;  Location: [place];  Service: Thoracic;  Laterality: Right;      Allergies: Patient has no known allergies.  Medications: Prior to Admission medications   Medication Sig Start Date End Date Taking? Authorizing Provider  amLODipine (NORVASC) 10 MG tablet Take 10 mg by mouth daily.   Yes [provider]    aspirin EC 81 MG tablet Take 81 mg by mouth daily.   Yes [provider]  lisinopril (PRINIVIL,ZESTRIL) 40 MG tablet Take 40 mg by mouth daily. 10/31/17  Yes [provider]  metoprolol succinate (TOPROL-XL) 25 MG 24 hr tablet Take 1 tablet (25 mg total) by mouth daily. 09/26/17  Yes Gold, Wayne E, PA-C  simvastatin (ZOCOR) 20 MG tablet Take 20 mg by mouth daily.   Yes [provider]  chlorproMAZINE (THORAZINE) 25 MG tablet Take 1 tablet (25 mg total) by mouth 3 (three) times daily as needed. 12/01/17   Maryanna Shape, NP  Oxycodone HCl 20 MG TABS Take 1 tablet by mouth every 6 (six) hours as needed. 10/31/17   [provider]  prochlorperazine (COMPAZINE) 10 MG tablet Take 1 tablet (10 mg total) by mouth every 6 (six) hours as needed for nausea or vomiting. 11/10/17   Curt Bears, MD  tadalafil (CIALIS) 5 MG tablet Take 5 mg by mouth daily. 10/25/17   [provider]     Vital Signs: BP (!) 143/81   Pulse 75   Temp 98.2 F (36.8 C) (Oral)   Resp 16   Ht 5\' 8"  (1.727 m)   Wt 156 lb (70.8 kg)   SpO2 98%   BMI 23.72 kg/m   Physical Exam awake, alert.  Chest with distant breath sounds bilaterally.  Heart with regular rate and rhythm.  Abdomen soft, positive bowel sounds, nontender.  No lower extremity edema.  Imaging: No results found.  Labs:  CBC: Recent Labs    12/13/17 1259 12/15/17 2330 12/22/17 0804 12/27/17 1210  WBC 12.1* 8.4 5.6 20.9*  HGB 11.6* 11.3* 11.2*  11.5*  HCT 36.1* 36.3* 34.8* 37.2*  PLT 251 281 98* 121*    COAGS: Recent Labs    07/29/17 1008 09/07/17 1010 12/27/17 1210  INR 1.14 1.10 0.94  APTT 35 35 33    BMP: Recent Labs    12/08/17 0805 12/13/17 1259 12/15/17 2332 12/22/17 0804  NA 139 141 136 139  K 4.5 4.3 3.9 4.4  CL 107 108 106 105  CO2 24 25 22 25   GLUCOSE 109* 103* 118* 105*  BUN 13 11 14 14   CALCIUM 9.6 9.3 9.0 9.7  CREATININE 1.60* 1.39* 1.30* 1.41*  GFRNONAA 39* 46* 50* 45*   GFRAA 45* 54* 58* 53*    LIVER FUNCTION TESTS: Recent Labs    12/08/17 0805 12/13/17 1259 12/15/17 2332 12/22/17 0804  BILITOT 0.3 0.3 0.5 0.4  AST 19 18 56* 47*  ALT 25 15 29  99*  ALKPHOS 117 94 80 133*  PROT 7.8 7.7 7.3 7.6  ALBUMIN 3.1* 3.0* 3.1* 3.3*    Assessment and Plan: Pt with history of stage IIIb squamous cell carcinoma of the right lung with prior right VATS/right middle lobectomy/mediastinal lymph node sampling on 09/19/2017.  He is currently receiving systemic chemotherapy and has had recent neulasta.  He has poor venous access and presents today for Port-A-Cath placement. Risks and benefits of image guided port-a-catheter placement was discussed with the patient/family including, but not limited to bleeding, infection, pneumothorax, or fibrin sheath development and need for additional procedures.  All of the patient's questions were answered, patient is agreeable to proceed. Consent signed and in chart.     Electronically Signed: D. Rowe Robert, PA-C 12/27/2017, 1:00 PM   I spent a total of 25 minutes at the the patient's bedside AND on the patient's hospital floor or unit, greater than 50% of which was counseling/coordinating care for port a cath placement

## 2017-12-27 NOTE — Discharge Instructions (Signed)
Moderate Conscious Sedation, Adult, Care After °These instructions provide you with information about caring for yourself after your procedure. Your health care provider may also give you more specific instructions. Your treatment has been planned according to current medical practices, but problems sometimes occur. Call your health care provider if you have any problems or questions after your procedure. °What can I expect after the procedure? °After your procedure, it is common: °· To feel sleepy for several hours. °· To feel clumsy and have poor balance for several hours. °· To have poor judgment for several hours. °· To vomit if you eat too soon. ° °Follow these instructions at home: °For at least 24 hours after the procedure: ° °· Do not: °? Participate in activities where you could fall or become injured. °? Drive. °? Use heavy machinery. °? Drink alcohol. °? Take sleeping pills or medicines that cause drowsiness. °? Make important decisions or sign legal documents. °? Take care of children on your own. °· Rest. °Eating and drinking °· Follow the diet recommended by your health care provider. °· If you vomit: °? Drink water, juice, or soup when you can drink without vomiting. °? Make sure you have little or no nausea before eating solid foods. °General instructions °· Have a responsible adult stay with you until you are awake and alert. °· Take over-the-counter and prescription medicines only as told by your health care provider. °· If you smoke, do not smoke without supervision. °· Keep all follow-up visits as told by your health care provider. This is important. °Contact a health care provider if: °· You keep feeling nauseous or you keep vomiting. °· You feel light-headed. °· You develop a rash. °· You have a fever. °Get help right away if: °· You have trouble breathing. °This information is not intended to replace advice given to you by your health care provider. Make sure you discuss any questions you have  with your health care provider. °Document Released: 12/13/2012 Document Revised: 07/28/2015 Document Reviewed: 06/14/2015 °Elsevier Interactive Patient Education © 2018 [employer] ° ° °Implanted Port Insertion, Care After °This sheet gives you information about how to care for yourself after your procedure. Your health care provider may also give you more specific instructions. If you have problems or questions, contact your health care provider. °What can I expect after the procedure? °After your procedure, it is common to have: °· Discomfort at the port insertion site. °· Bruising on the skin over the port. This should improve over 3-4 days. ° °Follow these instructions at home: °Port care °· After your port is placed, you will get a manufacturer's information card. The card has information about your port. Keep this card with you at all times. °· Take care of the port as told by your health care provider. Ask your health care provider if you or a family member can get training for taking care of the port at home. A home health care nurse may also take care of the port. °· Make sure to remember what type of port you have. °Incision care °· Follow instructions from your health care provider about how to take care of your port insertion site. Make sure you: °? Wash your hands with soap and water before you change your bandage (dressing). If soap and water are not available, use hand sanitizer. °? Change your dressing as told by your health care provider.  You may remove your dressing tomorrow. °? Leave stitches (sutures), skin glue, or adhesive strips   in place. These skin closures may need to stay in place for 2 weeks or longer. If adhesive strip edges start to loosen and curl up, you may trim the loose edges. Do not remove adhesive strips completely unless your health care provider tells you to do that.  DO NOT use EMLA cream for 2 weeks after port placement as this cream will remove surgical glue on your  incision. °· Check your port insertion site every day for signs of infection. Check for: °? More redness, swelling, or pain. °? More fluid or blood. °? Warmth. °? Pus or a bad smell. °General instructions °· Do not take baths, swim, or use a hot tub until your health care provider approves.  You may shower tomorrow. °· Do not lift anything that is heavier than 10 lb (4.5 kg) for a week, or as told by your health care provider. °· Ask your health care provider when it is okay to: °? Return to work or school. °? Resume usual physical activities or sports. °· Do not drive for 24 hours if you were given a medicine to help you relax (sedative). °· Take over-the-counter and prescription medicines only as told by your health care provider. °· Wear a medical alert bracelet in case of an emergency. This will tell any health care providers that you have a port. °· Keep all follow-up visits as told by your health care provider. This is important. °Contact a health care provider if: °· You cannot flush your port with saline as directed, or you cannot draw blood from the port. °· You have a fever or chills. °· You have more redness, swelling, or pain around your port insertion site. °· You have more fluid or blood coming from your port insertion site. °· Your port insertion site feels warm to the touch. °· You have pus or a bad smell coming from the port insertion site. °Get help right away if: °· You have chest pain or shortness of breath. °· You have bleeding from your port that you cannot control. °Summary °· Take care of the port as told by your health care provider. °· Change your dressing as told by your health care provider. °· Keep all follow-up visits as told by your health care provider. °This information is not intended to replace advice given to you by your health care provider. Make sure you discuss any questions you have with your health care provider. °Document Released: 12/13/2012 Document Revised: 01/14/2016  Document Reviewed: 01/14/2016 °Elsevier Interactive Patient Education © 2017 Elsevier Inc. ° ° °

## 2017-12-28 ENCOUNTER — Ambulatory Visit: Payer: Medicare Other | Admitting: Hematology

## 2017-12-28 ENCOUNTER — Ambulatory Visit: Payer: Medicare Other

## 2017-12-28 ENCOUNTER — Other Ambulatory Visit: Payer: Medicare Other

## 2017-12-29 ENCOUNTER — Inpatient Hospital Stay: Payer: Medicare Other

## 2017-12-29 ENCOUNTER — Ambulatory Visit: Payer: Medicare Other | Admitting: Internal Medicine

## 2017-12-29 ENCOUNTER — Other Ambulatory Visit: Payer: Medicare Other

## 2017-12-29 ENCOUNTER — Ambulatory Visit: Payer: Medicare Other

## 2017-12-29 DIAGNOSIS — C342 Malignant neoplasm of middle lobe, bronchus or lung: Secondary | ICD-10-CM | POA: Diagnosis not present

## 2017-12-29 DIAGNOSIS — C3491 Malignant neoplasm of unspecified part of right bronchus or lung: Secondary | ICD-10-CM

## 2017-12-29 LAB — CBC WITH DIFFERENTIAL (CANCER CENTER ONLY)
Abs Immature Granulocytes: 0.11 10*3/uL — ABNORMAL HIGH (ref 0.00–0.07)
BASOS ABS: 0 10*3/uL (ref 0.0–0.1)
Basophils Relative: 0 %
Eosinophils Absolute: 0.1 10*3/uL (ref 0.0–0.5)
Eosinophils Relative: 1 %
HCT: 37.6 % — ABNORMAL LOW (ref 39.0–52.0)
HEMOGLOBIN: 11.9 g/dL — AB (ref 13.0–17.0)
Immature Granulocytes: 1 %
LYMPHS PCT: 15 %
Lymphs Abs: 2.1 10*3/uL (ref 0.7–4.0)
MCH: 30.5 pg (ref 26.0–34.0)
MCHC: 31.6 g/dL (ref 30.0–36.0)
MCV: 96.4 fL (ref 80.0–100.0)
Monocytes Absolute: 1 10*3/uL (ref 0.1–1.0)
Monocytes Relative: 7 %
NEUTROS ABS: 10.7 10*3/uL — AB (ref 1.7–7.7)
NRBC: 0 % (ref 0.0–0.2)
Neutrophils Relative %: 76 %
PLATELETS: 122 10*3/uL — AB (ref 150–400)
RBC: 3.9 MIL/uL — AB (ref 4.22–5.81)
RDW: 15.9 % — AB (ref 11.5–15.5)
WBC: 14 10*3/uL — AB (ref 4.0–10.5)

## 2017-12-29 LAB — CMP (CANCER CENTER ONLY)
ALT: 26 U/L (ref 0–44)
AST: 25 U/L (ref 15–41)
Albumin: 3.3 g/dL — ABNORMAL LOW (ref 3.5–5.0)
Alkaline Phosphatase: 140 U/L — ABNORMAL HIGH (ref 38–126)
Anion gap: 10 (ref 5–15)
BUN: 14 mg/dL (ref 8–23)
CHLORIDE: 106 mmol/L (ref 98–111)
CO2: 24 mmol/L (ref 22–32)
CREATININE: 1.45 mg/dL — AB (ref 0.61–1.24)
Calcium: 9.6 mg/dL (ref 8.9–10.3)
GFR, EST AFRICAN AMERICAN: 51 mL/min — AB (ref >60)
GFR, EST NON AFRICAN AMERICAN: 44 mL/min — AB (ref >60)
Glucose, Bld: 126 mg/dL — ABNORMAL HIGH (ref 70–99)
POTASSIUM: 4.4 mmol/L (ref 3.5–5.1)
Sodium: 140 mmol/L (ref 135–145)
Total Bilirubin: 0.3 mg/dL (ref 0.3–1.2)
Total Protein: 7.8 g/dL (ref 6.5–8.1)

## 2017-12-30 ENCOUNTER — Ambulatory Visit: Payer: Medicare Other

## 2018-01-02 ENCOUNTER — Other Ambulatory Visit: Payer: Self-pay | Admitting: Medical Oncology

## 2018-01-02 DIAGNOSIS — Z95828 Presence of other vascular implants and grafts: Secondary | ICD-10-CM

## 2018-01-02 NOTE — Progress Notes (Signed)
Pt has emla cream from a relative.

## 2018-01-05 ENCOUNTER — Inpatient Hospital Stay (HOSPITAL_BASED_OUTPATIENT_CLINIC_OR_DEPARTMENT_OTHER): Payer: Medicare Other | Admitting: Oncology

## 2018-01-05 ENCOUNTER — Inpatient Hospital Stay: Payer: Medicare Other

## 2018-01-05 ENCOUNTER — Encounter: Payer: Self-pay | Admitting: Oncology

## 2018-01-05 VITALS — BP 125/77 | HR 101 | Temp 98.5°F | Resp 18 | Ht 68.0 in | Wt 148.5 lb

## 2018-01-05 VITALS — HR 88

## 2018-01-05 DIAGNOSIS — Z5111 Encounter for antineoplastic chemotherapy: Secondary | ICD-10-CM

## 2018-01-05 DIAGNOSIS — Z95828 Presence of other vascular implants and grafts: Secondary | ICD-10-CM

## 2018-01-05 DIAGNOSIS — Z79899 Other long term (current) drug therapy: Secondary | ICD-10-CM

## 2018-01-05 DIAGNOSIS — C3491 Malignant neoplasm of unspecified part of right bronchus or lung: Secondary | ICD-10-CM

## 2018-01-05 DIAGNOSIS — R630 Anorexia: Secondary | ICD-10-CM

## 2018-01-05 DIAGNOSIS — Z7982 Long term (current) use of aspirin: Secondary | ICD-10-CM

## 2018-01-05 DIAGNOSIS — E7800 Pure hypercholesterolemia, unspecified: Secondary | ICD-10-CM

## 2018-01-05 DIAGNOSIS — C342 Malignant neoplasm of middle lobe, bronchus or lung: Secondary | ICD-10-CM

## 2018-01-05 DIAGNOSIS — R634 Abnormal weight loss: Secondary | ICD-10-CM

## 2018-01-05 DIAGNOSIS — Z7689 Persons encountering health services in other specified circumstances: Secondary | ICD-10-CM

## 2018-01-05 DIAGNOSIS — G629 Polyneuropathy, unspecified: Secondary | ICD-10-CM | POA: Diagnosis not present

## 2018-01-05 DIAGNOSIS — R5383 Other fatigue: Secondary | ICD-10-CM

## 2018-01-05 DIAGNOSIS — K5900 Constipation, unspecified: Secondary | ICD-10-CM

## 2018-01-05 DIAGNOSIS — M549 Dorsalgia, unspecified: Secondary | ICD-10-CM

## 2018-01-05 DIAGNOSIS — I10 Essential (primary) hypertension: Secondary | ICD-10-CM

## 2018-01-05 LAB — CBC WITH DIFFERENTIAL (CANCER CENTER ONLY)
Abs Immature Granulocytes: 0.03 10*3/uL (ref 0.00–0.07)
BASOS ABS: 0 10*3/uL (ref 0.0–0.1)
BASOS PCT: 0 %
EOS ABS: 0 10*3/uL (ref 0.0–0.5)
EOS PCT: 0 %
HCT: 34.6 % — ABNORMAL LOW (ref 39.0–52.0)
Hemoglobin: 11.2 g/dL — ABNORMAL LOW (ref 13.0–17.0)
Immature Granulocytes: 0 %
Lymphocytes Relative: 11 %
Lymphs Abs: 1.2 10*3/uL (ref 0.7–4.0)
MCH: 30.8 pg (ref 26.0–34.0)
MCHC: 32.4 g/dL (ref 30.0–36.0)
MCV: 95.1 fL (ref 80.0–100.0)
Monocytes Absolute: 1 10*3/uL (ref 0.1–1.0)
Monocytes Relative: 8 %
Neutro Abs: 9.1 10*3/uL — ABNORMAL HIGH (ref 1.7–7.7)
Neutrophils Relative %: 81 %
Platelet Count: 167 10*3/uL (ref 150–400)
RBC: 3.64 MIL/uL — AB (ref 4.22–5.81)
RDW: 15.9 % — AB (ref 11.5–15.5)
WBC: 11.4 10*3/uL — AB (ref 4.0–10.5)
nRBC: 0 % (ref 0.0–0.2)

## 2018-01-05 LAB — CMP (CANCER CENTER ONLY)
ALT: 12 U/L (ref 0–44)
AST: 17 U/L (ref 15–41)
Albumin: 3.2 g/dL — ABNORMAL LOW (ref 3.5–5.0)
Alkaline Phosphatase: 98 U/L (ref 38–126)
Anion gap: 8 (ref 5–15)
BILIRUBIN TOTAL: 0.4 mg/dL (ref 0.3–1.2)
BUN: 17 mg/dL (ref 8–23)
CHLORIDE: 109 mmol/L (ref 98–111)
CO2: 23 mmol/L (ref 22–32)
CREATININE: 1.42 mg/dL — AB (ref 0.61–1.24)
Calcium: 9.3 mg/dL (ref 8.9–10.3)
GFR, EST NON AFRICAN AMERICAN: 45 mL/min — AB (ref >60)
GFR, Est AFR Am: 52 mL/min — ABNORMAL LOW (ref >60)
Glucose, Bld: 161 mg/dL — ABNORMAL HIGH (ref 70–99)
Potassium: 4 mmol/L (ref 3.5–5.1)
SODIUM: 140 mmol/L (ref 135–145)
Total Protein: 7.6 g/dL (ref 6.5–8.1)

## 2018-01-05 MED ORDER — FAMOTIDINE IN NACL 20-0.9 MG/50ML-% IV SOLN
INTRAVENOUS | Status: AC
  Filled 2018-01-05: qty 50

## 2018-01-05 MED ORDER — PALONOSETRON HCL INJECTION 0.25 MG/5ML
INTRAVENOUS | Status: AC
  Filled 2018-01-05: qty 5

## 2018-01-05 MED ORDER — SODIUM CHLORIDE 0.9% FLUSH
10.0000 mL | INTRAVENOUS | Status: DC | PRN
  Administered 2018-01-05: 10 mL
  Filled 2018-01-05: qty 10

## 2018-01-05 MED ORDER — FAMOTIDINE IN NACL 20-0.9 MG/50ML-% IV SOLN
20.0000 mg | Freq: Once | INTRAVENOUS | Status: AC
  Administered 2018-01-05: 20 mg via INTRAVENOUS

## 2018-01-05 MED ORDER — SODIUM CHLORIDE 0.9% FLUSH
10.0000 mL | INTRAVENOUS | Status: DC | PRN
  Administered 2018-01-05: 10 mL via INTRAVENOUS
  Filled 2018-01-05: qty 10

## 2018-01-05 MED ORDER — DIPHENHYDRAMINE HCL 50 MG/ML IJ SOLN
INTRAMUSCULAR | Status: AC
  Filled 2018-01-05: qty 1

## 2018-01-05 MED ORDER — PALONOSETRON HCL INJECTION 0.25 MG/5ML
0.2500 mg | Freq: Once | INTRAVENOUS | Status: AC
  Administered 2018-01-05: 0.25 mg via INTRAVENOUS

## 2018-01-05 MED ORDER — SODIUM CHLORIDE 0.9 % IV SOLN
Freq: Once | INTRAVENOUS | Status: AC
  Administered 2018-01-05: 12:00:00 via INTRAVENOUS
  Filled 2018-01-05: qty 5

## 2018-01-05 MED ORDER — SODIUM CHLORIDE 0.9 % IV SOLN
175.0000 mg/m2 | Freq: Once | INTRAVENOUS | Status: AC
  Administered 2018-01-05: 324 mg via INTRAVENOUS
  Filled 2018-01-05: qty 54

## 2018-01-05 MED ORDER — SODIUM CHLORIDE 0.9 % IV SOLN
332.0000 mg | Freq: Once | INTRAVENOUS | Status: AC
  Administered 2018-01-05: 330 mg via INTRAVENOUS
  Filled 2018-01-05: qty 33

## 2018-01-05 MED ORDER — SODIUM CHLORIDE 0.9 % IV SOLN
Freq: Once | INTRAVENOUS | Status: AC
  Administered 2018-01-05: 11:00:00 via INTRAVENOUS
  Filled 2018-01-05: qty 250

## 2018-01-05 MED ORDER — HEPARIN SOD (PORK) LOCK FLUSH 100 UNIT/ML IV SOLN
500.0000 [IU] | Freq: Once | INTRAVENOUS | Status: AC | PRN
  Administered 2018-01-05: 500 [IU]
  Filled 2018-01-05: qty 5

## 2018-01-05 MED ORDER — DIPHENHYDRAMINE HCL 50 MG/ML IJ SOLN
50.0000 mg | Freq: Once | INTRAMUSCULAR | Status: AC
  Administered 2018-01-05: 50 mg via INTRAVENOUS

## 2018-01-05 NOTE — Assessment & Plan Note (Addendum)
This is a very pleasant 82 year old African-American male recently diagnosed with stage IIB (T3, N0, M0) invasive well-differentiated squamous cell carcinoma presented with right middle lobe lung mass status post right middle lobectomy with lymph node sampling on September 19, 2017 under the care of Dr. Roxan Hockey. The patient is currently undergoing adjuvant treatment with systemic chemotherapy with carboplatin for AUC of 6 and paclitaxel 200 mg/M2 every 3 weeks with Neulasta support status post 2 cycles.  He has been tolerating his treatment well with the exception of weight loss, peripheral neuropathy, constipation, and pain secondary to Neulasta.  The patient was seen with Dr. Julien Nordmann.  Due to his worsening peripheral neuropathy, will adjust the dose of his chemotherapy.  Carboplatin dose will be reduced to an AUC of 5 and the paclitaxel dose will be reduced to 175 mg meter squared beginning with cycle #3.  He will proceed with chemotherapy today as scheduled with the reduced doses.  For pain secondary to Neulasta, we have encouraged him to continue Claritin and use Tylenol on a regular basis.  He was advised to let us know if his pain is not controlled.  For the decreased appetite and weight loss, I have referred him to the dietitian.  The patient will continue to have weekly labs.  He will follow-up in 3 weeks for evaluation prior to cycle #4.  The patient was advised to call immediately if he has any concerning symptoms in the interval. The patient voices understanding of current disease status and treatment options and is in agreement with the current care plan.  All questions were answered. The patient knows to call the clinic with any problems, questions or concerns. We can certainly see the patient much sooner if necessary.

## 2018-01-05 NOTE — Progress Notes (Signed)
[facility] OFFICE PROGRESS NOTE  [address], [address] [place] [address]  DIAGNOSIS: Stage IIB (T3, N0, M0) non-small cell lung cancer, invasive well-differentiated squamous cell carcinoma presented with right middle lobe lung mass.  PRIOR THERAPY: Status post right VATS, right middle lobectomy with mediastinal lymph node sampling under the care of Dr. Roxan Hockey on 09/19/2017.  The tumor measured 4.2 cm but the carcinoma extends through the visceral pleura.  CURRENT THERAPY: Adjuvant systemic chemotherapy with carboplatin for AUC of 6 and paclitaxel 200 mg/M2 every 3 weeks.  Carboplatin dose was reduced to an AUC of 5 and paclitaxel was reduced to 175 mg meter squared beginning with cycle #3 secondary to peripheral neuropathy.  First dose 11/16/2017.  Status post 2 cycles.  INTERVAL HISTORY: Danny Duke 82 y.o. male returns for routine follow-up visit accompanied by his wife.  The patient is feeling fine today and has no specific complaints except for constipation, peripheral neuropathy, and pain secondary to Neulasta.  The patient reports that he has constipation well managed with stool softeners and drinking prune juice.  He reports that the peripheral neuropathy has worsened since starting chemotherapy in his fingertips and toes.  Peripheral neuropathy is more noticeable in the evenings and at night.  He reports that he had pain with the Neulasta injection despite taking Claritin and Tylenol.  He denies fevers and chills.  Denies chest pain, shortness of breath, cough, hemoptysis.  Denies nausea, vomiting, constipation, diarrhea.  The patient has lost weight and reports that his appetite has been decreased secondary to taste changes.  Denies night sweats.  The patient is here for evaluation prior to cycle #3 of his chemotherapy.  MEDICAL HISTORY: Past Medical History:  Diagnosis Date  . Cancer [facility])    possible lung cancer  . Chronic back pain   .  Hypercholesterolemia   . Hypertension     ALLERGIES:  has No Known Allergies.  MEDICATIONS:  Current Outpatient Medications  Medication Sig Dispense Refill  . amLODipine (NORVASC) 10 MG tablet Take 10 mg by mouth daily.    Marland Kitchen aspirin EC 81 MG tablet Take 81 mg by mouth daily.    . chlorproMAZINE (THORAZINE) 25 MG tablet Take 1 tablet (25 mg total) by mouth 3 (three) times daily as needed. 30 tablet 0  . lisinopril (PRINIVIL,ZESTRIL) 40 MG tablet Take 40 mg by mouth daily.  1  . metoprolol succinate (TOPROL-XL) 25 MG 24 hr tablet Take 1 tablet (25 mg total) by mouth daily. 60 tablet 1  . Oxycodone HCl 20 MG TABS Take 1 tablet by mouth every 6 (six) hours as needed.  0  . prochlorperazine (COMPAZINE) 10 MG tablet Take 1 tablet (10 mg total) by mouth every 6 (six) hours as needed for nausea or vomiting. 30 tablet 0  . simvastatin (ZOCOR) 20 MG tablet Take 20 mg by mouth daily.    . tadalafil (CIALIS) 5 MG tablet Take 5 mg by mouth daily.  12   No current facility-administered medications for this visit.    Facility-Administered Medications Ordered in Other Visits  Medication Dose Route Frequency Provider Last Rate Last Dose  . CARBOplatin (PARAPLATIN) 330 mg in sodium chloride 0.9 % 250 mL chemo infusion  330 mg Intravenous Once Curt Bears, MD      . heparin lock flush 100 unit/mL  500 Units Intracatheter Once PRN Curt Bears, MD      . PACLitaxel (TAXOL) 324 mg in sodium chloride 0.9 %  500 mL chemo infusion (> 80mg /m2)  175 mg/m2 (Treatment Plan Recorded) Intravenous Once Curt Bears, MD      . sodium chloride flush (NS) 0.9 % injection 10 mL  10 mL Intracatheter PRN Curt Bears, MD        SURGICAL HISTORY:  Past Surgical History:  Procedure Laterality Date  . BRONCHIAL NEEDLE ASPIRATION BIOPSY  08/05/2017   Procedure: BRONCHIAL NEEDLE ASPIRATION BIOPSIES;  Surgeon: Juanito Doom, MD;  Location: [facility] ENDOSCOPY;  Service: Cardiopulmonary;;  . ENDOBRONCHIAL  ULTRASOUND Bilateral 08/05/2017   Procedure: ENDOBRONCHIAL ULTRASOUND;  Surgeon: Juanito Doom, MD;  Location: [facility] ENDOSCOPY;  Service: Cardiopulmonary;  Laterality: Bilateral;  . IR IMAGING GUIDED PORT INSERTION  12/27/2017  . MEDIASTINOSCOPY N/A 09/19/2017   Procedure: MEDIASTINOSCOPY;  Surgeon: [name], MD;  Location: [facility];  Service: Thoracic;  Laterality: N/A;  . MULTIPLE TOOTH EXTRACTIONS    . VIDEO ASSISTED THORACOSCOPY (VATS)/ LOBECTOMY Right 09/19/2017   Procedure: RIGHT VIDEO ASSISTED THORACOSCOPY (VATS)/ LOBECTOMY;  Surgeon: [name], MD;  Location: [facility];  Service: Thoracic;  Laterality: Right;    REVIEW OF SYSTEMS:   Review of Systems  Constitutional: Negative for chills, fatigue, fever.  Positive for decreased appetite and weight loss. HENT:   Negative for mouth sores, nosebleeds, sore throat and trouble swallowing.   Eyes: Negative for eye problems and icterus.  Respiratory: Negative for cough, hemoptysis, shortness of breath and wheezing.   Cardiovascular: Negative for chest pain and leg swelling.  Gastrointestinal: Negative for abdominal pain, diarrhea, nausea and vomiting.  Positive for constipation which is well managed with stool softeners and prune juice. Genitourinary: Negative for bladder incontinence, difficulty urinating, dysuria, frequency and hematuria.   Musculoskeletal: Denies pain today but has arthralgias secondary to Neulasta. Skin: Negative for itching and rash.  Neurological: Negative for dizziness, extremity weakness, gait problem, headaches, light-headedness and seizures.  Hematological: Negative for adenopathy. Does not bruise/bleed easily.  Psychiatric/Behavioral: Negative for confusion, depression and sleep disturbance. The patient is not nervous/anxious.     PHYSICAL EXAMINATION:  Blood pressure 125/77, pulse (!) 101, temperature 98.5 F (36.9 C), temperature source Oral, resp. rate 18, height 5\' 8"  (1.727 m), weight 148  lb 8 oz (67.4 kg), SpO2 96 %.  ECOG PERFORMANCE STATUS: 1 - Symptomatic but completely ambulatory  Physical Exam  Constitutional: Oriented to person, place, and time and well-developed, well-nourished, and in no distress. No distress.  HENT:  Head: Normocephalic and atraumatic.  Mouth/Throat: Oropharynx is clear and moist. No oropharyngeal exudate.  Eyes: Conjunctivae are normal. Right eye exhibits no discharge. Left eye exhibits no discharge. No scleral icterus.  Neck: Normal range of motion. Neck supple.  Cardiovascular: Normal rate, regular rhythm, normal heart sounds and intact distal pulses.   Pulmonary/Chest: Effort normal and breath sounds normal. No respiratory distress. No wheezes. No rales.  Abdominal: Soft. Bowel sounds are normal. Exhibits no distension and no mass. There is no tenderness.  Musculoskeletal: Normal range of motion. Exhibits no edema.  Lymphadenopathy:    No cervical adenopathy.  Neurological: Alert and oriented to person, place, and time. Exhibits normal muscle tone. Gait normal. Coordination normal.  Skin: Skin is warm and dry. No rash noted. Not diaphoretic. No erythema. No pallor.  Psychiatric: Mood, memory and judgment normal.  Vitals reviewed.  LABORATORY DATA: Lab Results  Component Value Date   WBC 11.4 (H) 01/05/2018   HGB 11.2 (L) 01/05/2018   HCT 34.6 (L) 01/05/2018   MCV 95.1 01/05/2018  PLT 167 01/05/2018      Chemistry      Component Value Date/Time   NA 140 01/05/2018 0931   K 4.0 01/05/2018 0931   CL 109 01/05/2018 0931   CO2 23 01/05/2018 0931   BUN 17 01/05/2018 0931   CREATININE 1.42 (H) 01/05/2018 0931      Component Value Date/Time   CALCIUM 9.3 01/05/2018 0931   ALKPHOS 98 01/05/2018 0931   AST 17 01/05/2018 0931   ALT 12 01/05/2018 0931   BILITOT 0.4 01/05/2018 0931       RADIOGRAPHIC STUDIES:  Dg Chest 2 View  Result Date: 12/16/2017 CLINICAL DATA:  Chest pain EXAM: CHEST - 2 VIEW COMPARISON:  11/29/2017  FINDINGS: Unchanged appearance of scarring/atelectasis at the right lung base and right mid lung. Postsurgical changes of the right lung. No focal airspace consolidation or pulmonary edema. IMPRESSION: No active cardiopulmonary disease. Electronically Signed   By: Ulyses Jarred M.D.   On: 12/16/2017 00:35   Ir Imaging Guided Port Insertion  Result Date: 12/27/2017 CLINICAL DATA:  Lung cancer EXAM: RIGHT INTERNAL JUGULAR SINGLE LUMEN POWER PORT CATHETER INSERTION Date:  12/27/2017 12/27/2017 2:56 pm Radiologist:  Jerilynn Mages. Daryll Brod, MD Guidance:  Ultrasound and fluoroscopic MEDICATIONS: Ancef 2 g; The antibiotic was administered within an appropriate time interval prior to skin puncture. ANESTHESIA/SEDATION: Versed 2.0 mg IV; Fentanyl 100 mcg IV; Moderate Sedation Time:  24 minutes The patient was continuously monitored during the procedure by the interventional radiology nurse under my direct supervision. FLUOROSCOPY TIME:  1 minutes, 18 seconds (12 mGy) COMPLICATIONS: None immediate. CONTRAST:  None. PROCEDURE: Informed consent was obtained from the patient following explanation of the procedure, risks, benefits and alternatives. The patient understands, agrees and consents for the procedure. All questions were addressed. A time out was performed. Maximal barrier sterile technique utilized including caps, mask, sterile gowns, sterile gloves, large sterile drape, hand hygiene, and 2% chlorhexidine scrub. Under sterile conditions and local anesthesia, right internal jugular micropuncture venous access was performed. Access was performed with ultrasound. Images were obtained for documentation of the patent right internal jugular vein. A guide wire was inserted followed by a transitional dilator. This allowed insertion of a guide wire and catheter into the IVC. Measurements were obtained from the SVC / RA junction back to the right IJ venotomy site. In the right infraclavicular chest, a subcutaneous pocket was  created over the second anterior rib. This was done under sterile conditions and local anesthesia. 1% lidocaine with epinephrine was utilized for this. A 2.5 cm incision was made in the skin. Blunt dissection was performed to create a subcutaneous pocket over the right pectoralis major muscle. The pocket was flushed with saline vigorously. There was adequate hemostasis. The port catheter was assembled and checked for leakage. The port catheter was secured in the pocket with two retention sutures. The tubing was tunneled subcutaneously to the right venotomy site and inserted into the SVC/RA junction through a valved peel-away sheath. Position was confirmed with fluoroscopy. Images were obtained for documentation. The patient tolerated the procedure well. No immediate complications. Incisions were closed in a two layer fashion with 4 - 0 Vicryl suture. Dermabond was applied to the skin. The port catheter was accessed, blood was aspirated followed by saline and heparin flushes. Needle was removed. A dry sterile dressing was applied. IMPRESSION: Ultrasound and fluoroscopically guided right internal jugular single lumen power port catheter insertion. Tip in the SVC/RA junction. Catheter ready for use. Electronically Signed  By: Eugenie Filler M.D.   On: 12/27/2017 15:09     ASSESSMENT/PLAN:  Squamous cell carcinoma lung, right [facility]) This is a very pleasant 82 year old African-American male recently diagnosed with stage IIB (T3, N0, M0) invasive well-differentiated squamous cell carcinoma presented with right middle lobe lung mass status post right middle lobectomy with lymph node sampling on September 19, 2017 under the care of Dr. Roxan Hockey. The patient is currently undergoing adjuvant treatment with systemic chemotherapy with carboplatin for AUC of 6 and paclitaxel 200 mg/M2 every 3 weeks with Neulasta support status post 2 cycles.  He has been tolerating his treatment well with the exception of weight loss, peripheral  neuropathy, constipation, and pain secondary to Neulasta.  The patient was seen with Dr. Julien Nordmann.  Due to his worsening peripheral neuropathy, will adjust the dose of his chemotherapy.  Carboplatin dose will be reduced to an AUC of 5 and the paclitaxel dose will be reduced to 175 mg meter squared beginning with cycle #3.  He will proceed with chemotherapy today as scheduled with the reduced doses.  For pain secondary to Neulasta, we have encouraged him to continue Claritin and use Tylenol on a regular basis.  He was advised to let us know if his pain is not controlled.  For the decreased appetite and weight loss, I have referred him to the dietitian.  The patient will continue to have weekly labs.  He will follow-up in 3 weeks for evaluation prior to cycle #4.  The patient was advised to call immediately if he has any concerning symptoms in the interval. The patient voices understanding of current disease status and treatment options and is in agreement with the current care plan.  All questions were answered. The patient knows to call the clinic with any problems, questions or concerns. We can certainly see the patient much sooner if necessary.   Orders Placed This Encounter  Procedures  . Amb Referral to Nutrition and Diabetic E    Referral Priority:   Routine    Referral Type:   Consultation    Referral Reason:   Specialty Services Required    Number of Visits Requested:   [place], DNP, AGPCNP-BC, AOCNP 01/05/18   ADDENDUM: Hematology/Oncology Attending: I had a face-to-face encounter with the patient.  I recommended his care plan.  This is a very pleasant 82 years old African-American male with a stage IIb non-small cell lung cancer, squamous cell carcinoma status post right middle lobectomy with lymph node dissection.  The patient is currently undergoing adjuvant systemic chemotherapy with carboplatin for AUC of 6 and paclitaxel 200 mg/M2 every 3 weeks status post 2  cycles.  He has been tolerating the treatment well except for increasing numbness and tingling in his fingers and toes consistent with chemotherapy-induced peripheral neuropathy as well as aching pain and fatigue after the Neulasta injection. I recommended for the patient to continue his current treatment but I will reduce the dose of carboplatin to AUC of 5 and paclitaxel to 175 MG/M2 starting from cycle #3. For the Neulasta pain, the patient will continue with his current pain medication as well as Claritin as needed. We will see the patient back for follow-up visit in 3 weeks for evaluation before starting the last cycle of his adjuvant therapy. He was advised to call immediately if he has any concerning symptoms in the interval.  Disclaimer: This note was dictated with voice recognition software. Similar sounding words can inadvertently  be transcribed and may be missed upon review. Eilleen Kempf, MD 01/06/18

## 2018-01-05 NOTE — Patient Instructions (Signed)
[facility] Discharge Instructions for Patients Receiving Chemotherapy  Today you received the following chemotherapy agents: Paclitaxel (Taxol) and Carboplatin (Paraplatin)  To help prevent nausea and vomiting after your treatment, we encourage you to take your nausea medication as directed.    If you develop nausea and vomiting that is not controlled by your nausea medication, call the clinic.   BELOW ARE SYMPTOMS THAT SHOULD BE REPORTED IMMEDIATELY:  *FEVER GREATER THAN 100.5 F  *CHILLS WITH OR WITHOUT FEVER  NAUSEA AND VOMITING THAT IS NOT CONTROLLED WITH YOUR NAUSEA MEDICATION  *UNUSUAL SHORTNESS OF BREATH  *UNUSUAL BRUISING OR BLEEDING  TENDERNESS IN MOUTH AND THROAT WITH OR WITHOUT PRESENCE OF ULCERS  *URINARY PROBLEMS  *BOWEL PROBLEMS  UNUSUAL RASH Items with * indicate a potential emergency and should be followed up as soon as possible.  Feel free to call the clinic should you have any questions or concerns. The clinic phone number is [phone].  Please show the [facility] at check-in to the Emergency Department and triage nurse.

## 2018-01-06 ENCOUNTER — Telehealth: Payer: Self-pay | Admitting: Oncology

## 2018-01-06 NOTE — Telephone Encounter (Signed)
Appts already scheduled to the end of treatment plan per 10/31 los.

## 2018-01-07 ENCOUNTER — Ambulatory Visit: Payer: Medicare Other

## 2018-01-07 ENCOUNTER — Inpatient Hospital Stay: Payer: Medicare Other | Attending: Internal Medicine

## 2018-01-07 VITALS — BP 141/85 | HR 93 | Temp 98.9°F | Resp 18

## 2018-01-07 DIAGNOSIS — C3491 Malignant neoplasm of unspecified part of right bronchus or lung: Secondary | ICD-10-CM

## 2018-01-07 DIAGNOSIS — M549 Dorsalgia, unspecified: Secondary | ICD-10-CM | POA: Insufficient documentation

## 2018-01-07 DIAGNOSIS — E7800 Pure hypercholesterolemia, unspecified: Secondary | ICD-10-CM | POA: Diagnosis not present

## 2018-01-07 DIAGNOSIS — G8929 Other chronic pain: Secondary | ICD-10-CM | POA: Insufficient documentation

## 2018-01-07 DIAGNOSIS — C342 Malignant neoplasm of middle lobe, bronchus or lung: Secondary | ICD-10-CM | POA: Insufficient documentation

## 2018-01-07 DIAGNOSIS — I10 Essential (primary) hypertension: Secondary | ICD-10-CM | POA: Diagnosis not present

## 2018-01-07 DIAGNOSIS — Z79899 Other long term (current) drug therapy: Secondary | ICD-10-CM | POA: Insufficient documentation

## 2018-01-07 DIAGNOSIS — Z9221 Personal history of antineoplastic chemotherapy: Secondary | ICD-10-CM | POA: Insufficient documentation

## 2018-01-07 DIAGNOSIS — G629 Polyneuropathy, unspecified: Secondary | ICD-10-CM | POA: Insufficient documentation

## 2018-01-07 DIAGNOSIS — Z7982 Long term (current) use of aspirin: Secondary | ICD-10-CM | POA: Insufficient documentation

## 2018-01-07 DIAGNOSIS — Z7689 Persons encountering health services in other specified circumstances: Secondary | ICD-10-CM | POA: Insufficient documentation

## 2018-01-07 MED ORDER — PEGFILGRASTIM-CBQV 6 MG/0.6ML ~~LOC~~ SOSY
PREFILLED_SYRINGE | SUBCUTANEOUS | Status: AC
  Filled 2018-01-07: qty 0.6

## 2018-01-07 MED ORDER — PEGFILGRASTIM-CBQV 6 MG/0.6ML ~~LOC~~ SOSY
6.0000 mg | PREFILLED_SYRINGE | Freq: Once | SUBCUTANEOUS | Status: AC
  Administered 2018-01-07: 6 mg via SUBCUTANEOUS

## 2018-01-07 NOTE — Patient Instructions (Signed)
Pegfilgrastim injection What is this medicine? PEGFILGRASTIM (PEG fil gra stim) is a long-acting granulocyte colony-stimulating factor that stimulates the growth of neutrophils, a type of white blood cell important in the body's fight against infection. It is used to reduce the incidence of fever and infection in patients with certain types of cancer who are receiving chemotherapy that affects the bone marrow, and to increase survival after being exposed to high doses of radiation. This medicine may be used for other purposes; ask your health care provider or pharmacist if you have questions. COMMON BRAND NAME(S): Neulasta What should I tell my health care provider before I take this medicine? They need to know if you have any of these conditions: -kidney disease -latex allergy -ongoing radiation therapy -sickle cell disease -skin reactions to acrylic adhesives (On-Body Injector only) -an unusual or allergic reaction to pegfilgrastim, filgrastim, other medicines, foods, dyes, or preservatives -pregnant or trying to get pregnant -breast-feeding How should I use this medicine? This medicine is for injection under the skin. If you get this medicine at home, you will be taught how to prepare and give the pre-filled syringe or how to use the On-body Injector. Refer to the patient Instructions for Use for detailed instructions. Use exactly as directed. Tell your healthcare provider immediately if you suspect that the On-body Injector may not have performed as intended or if you suspect the use of the On-body Injector resulted in a missed or partial dose. It is important that you put your used needles and syringes in a special sharps container. Do not put them in a trash can. If you do not have a sharps container, call your pharmacist or healthcare provider to get one. Talk to your pediatrician regarding the use of this medicine in children. While this drug may be prescribed for selected conditions,  precautions do apply. Overdosage: If you think you have taken too much of this medicine contact a poison control center or emergency room at once. NOTE: This medicine is only for you. Do not share this medicine with others. What if I miss a dose? It is important not to miss your dose. Call your doctor or health care professional if you miss your dose. If you miss a dose due to an On-body Injector failure or leakage, a new dose should be administered as soon as possible using a single prefilled syringe for manual use. What may interact with this medicine? Interactions have not been studied. Give your health care provider a list of all the medicines, herbs, non-prescription drugs, or dietary supplements you use. Also tell them if you smoke, drink alcohol, or use illegal drugs. Some items may interact with your medicine. This list may not describe all possible interactions. Give your health care provider a list of all the medicines, herbs, non-prescription drugs, or dietary supplements you use. Also tell them if you smoke, drink alcohol, or use illegal drugs. Some items may interact with your medicine. What should I watch for while using this medicine? You may need blood work done while you are taking this medicine. If you are going to need a MRI, CT scan, or other procedure, tell your doctor that you are using this medicine (On-Body Injector only). What side effects may I notice from receiving this medicine? Side effects that you should report to your doctor or health care professional as soon as possible: -allergic reactions like skin rash, itching or hives, swelling of the face, lips, or tongue -dizziness -fever -pain, redness, or irritation at site   where injected -pinpoint red spots on the skin -red or dark-brown urine -shortness of breath or breathing problems -stomach or side pain, or pain at the shoulder -swelling -tiredness -trouble passing urine or change in the amount of urine Side  effects that usually do not require medical attention (report to your doctor or health care professional if they continue or are bothersome): -bone pain -muscle pain This list may not describe all possible side effects. Call your doctor for medical advice about side effects. You may report side effects to FDA at 1-800-FDA-1088. Where should I keep my medicine? Keep out of the reach of children. Store pre-filled syringes in a refrigerator between 2 and 8 degrees C (36 and 46 degrees F). Do not freeze. Keep in carton to protect from light. Throw away this medicine if it is left out of the refrigerator for more than 48 hours. Throw away any unused medicine after the expiration date. NOTE: This sheet is a summary. It may not cover all possible information. If you have questions about this medicine, talk to your doctor, pharmacist, or health care provider.  2018 Elsevier/Gold Standard (2016-02-19 12:58:03)  

## 2018-01-12 ENCOUNTER — Inpatient Hospital Stay: Payer: Medicare Other

## 2018-01-12 DIAGNOSIS — Z95828 Presence of other vascular implants and grafts: Secondary | ICD-10-CM

## 2018-01-12 DIAGNOSIS — C342 Malignant neoplasm of middle lobe, bronchus or lung: Secondary | ICD-10-CM | POA: Diagnosis not present

## 2018-01-12 DIAGNOSIS — C3491 Malignant neoplasm of unspecified part of right bronchus or lung: Secondary | ICD-10-CM

## 2018-01-12 LAB — CBC WITH DIFFERENTIAL (CANCER CENTER ONLY)
ABS IMMATURE GRANULOCYTES: 0.12 10*3/uL — AB (ref 0.00–0.07)
BASOS ABS: 0 10*3/uL (ref 0.0–0.1)
Basophils Relative: 1 %
EOS ABS: 0.2 10*3/uL (ref 0.0–0.5)
Eosinophils Relative: 4 %
HEMATOCRIT: 31.6 % — AB (ref 39.0–52.0)
Hemoglobin: 10.2 g/dL — ABNORMAL LOW (ref 13.0–17.0)
IMMATURE GRANULOCYTES: 2 %
LYMPHS ABS: 1.2 10*3/uL (ref 0.7–4.0)
Lymphocytes Relative: 21 %
MCH: 30.9 pg (ref 26.0–34.0)
MCHC: 32.3 g/dL (ref 30.0–36.0)
MCV: 95.8 fL (ref 80.0–100.0)
Monocytes Absolute: 0.9 10*3/uL (ref 0.1–1.0)
Monocytes Relative: 15 %
NEUTROS ABS: 3.4 10*3/uL (ref 1.7–7.7)
NEUTROS PCT: 57 %
NRBC: 0 % (ref 0.0–0.2)
Platelet Count: 61 10*3/uL — ABNORMAL LOW (ref 150–400)
RBC: 3.3 MIL/uL — ABNORMAL LOW (ref 4.22–5.81)
RDW: 16.2 % — AB (ref 11.5–15.5)
WBC Count: 5.9 10*3/uL (ref 4.0–10.5)

## 2018-01-12 LAB — CMP (CANCER CENTER ONLY)
ALBUMIN: 3.4 g/dL — AB (ref 3.5–5.0)
ALT: 131 U/L — ABNORMAL HIGH (ref 0–44)
AST: 53 U/L — AB (ref 15–41)
Alkaline Phosphatase: 149 U/L — ABNORMAL HIGH (ref 38–126)
Anion gap: 7 (ref 5–15)
BILIRUBIN TOTAL: 0.7 mg/dL (ref 0.3–1.2)
BUN: 13 mg/dL (ref 8–23)
CALCIUM: 9.6 mg/dL (ref 8.9–10.3)
CO2: 25 mmol/L (ref 22–32)
CREATININE: 1.14 mg/dL (ref 0.61–1.24)
Chloride: 106 mmol/L (ref 98–111)
GFR, EST NON AFRICAN AMERICAN: 59 mL/min — AB (ref >60)
Glucose, Bld: 96 mg/dL (ref 70–99)
Potassium: 4.3 mmol/L (ref 3.5–5.1)
SODIUM: 138 mmol/L (ref 135–145)
TOTAL PROTEIN: 7.2 g/dL (ref 6.5–8.1)

## 2018-01-12 MED ORDER — SODIUM CHLORIDE 0.9% FLUSH
10.0000 mL | INTRAVENOUS | Status: DC | PRN
  Administered 2018-01-12: 10 mL via INTRAVENOUS
  Filled 2018-01-12: qty 10

## 2018-01-12 MED ORDER — HEPARIN SOD (PORK) LOCK FLUSH 100 UNIT/ML IV SOLN
500.0000 [IU] | Freq: Once | INTRAVENOUS | Status: AC
  Administered 2018-01-12: 500 [IU] via INTRAVENOUS
  Filled 2018-01-12: qty 5

## 2018-01-16 ENCOUNTER — Telehealth: Payer: Self-pay | Admitting: Medical Oncology

## 2018-01-16 NOTE — Telephone Encounter (Signed)
We can try gabapentin if he is agreeable.

## 2018-01-16 NOTE — Telephone Encounter (Signed)
"  Hot, Burning pain in feet and lower legs x 10 d -( since  neulasta 11/2-). Trouble walking and sleeping hs . Sometimes putting on socks irritates his feet. Tylenol not helping. States he needs something stronger than tylenol.

## 2018-01-17 ENCOUNTER — Other Ambulatory Visit: Payer: Self-pay | Admitting: Medical Oncology

## 2018-01-17 DIAGNOSIS — G620 Drug-induced polyneuropathy: Secondary | ICD-10-CM

## 2018-01-17 MED ORDER — GABAPENTIN 300 MG PO CAPS: 300.0000 mg | ORAL_CAPSULE | Freq: Three times a day (TID) | ORAL | 0 refills | Status: DC

## 2018-01-17 NOTE — Telephone Encounter (Signed)
Pt agreeable to gabapentin. Rx sent to preferred pharmacy.

## 2018-01-18 ENCOUNTER — Ambulatory Visit: Payer: Medicare Other

## 2018-01-18 ENCOUNTER — Other Ambulatory Visit: Payer: Medicare Other

## 2018-01-18 ENCOUNTER — Ambulatory Visit: Payer: Medicare Other | Admitting: Nurse Practitioner

## 2018-01-19 ENCOUNTER — Inpatient Hospital Stay: Payer: Medicare Other

## 2018-01-19 DIAGNOSIS — C3491 Malignant neoplasm of unspecified part of right bronchus or lung: Secondary | ICD-10-CM

## 2018-01-19 DIAGNOSIS — Z95828 Presence of other vascular implants and grafts: Secondary | ICD-10-CM

## 2018-01-19 DIAGNOSIS — C342 Malignant neoplasm of middle lobe, bronchus or lung: Secondary | ICD-10-CM | POA: Diagnosis not present

## 2018-01-19 LAB — CBC WITH DIFFERENTIAL (CANCER CENTER ONLY)
ABS IMMATURE GRANULOCYTES: 0.1 10*3/uL — AB (ref 0.00–0.07)
BASOS ABS: 0 10*3/uL (ref 0.0–0.1)
Basophils Relative: 0 %
Eosinophils Absolute: 0.1 10*3/uL (ref 0.0–0.5)
Eosinophils Relative: 1 %
HCT: 33.4 % — ABNORMAL LOW (ref 39.0–52.0)
Hemoglobin: 10.6 g/dL — ABNORMAL LOW (ref 13.0–17.0)
Immature Granulocytes: 1 %
LYMPHS ABS: 1.8 10*3/uL (ref 0.7–4.0)
LYMPHS PCT: 13 %
MCH: 31 pg (ref 26.0–34.0)
MCHC: 31.7 g/dL (ref 30.0–36.0)
MCV: 97.7 fL (ref 80.0–100.0)
Monocytes Absolute: 1 10*3/uL (ref 0.1–1.0)
Monocytes Relative: 7 %
NEUTROS PCT: 78 %
NRBC: 0 % (ref 0.0–0.2)
Neutro Abs: 10.4 10*3/uL — ABNORMAL HIGH (ref 1.7–7.7)
PLATELETS: 114 10*3/uL — AB (ref 150–400)
RBC: 3.42 MIL/uL — ABNORMAL LOW (ref 4.22–5.81)
RDW: 17.2 % — AB (ref 11.5–15.5)
WBC Count: 13.3 10*3/uL — ABNORMAL HIGH (ref 4.0–10.5)

## 2018-01-19 LAB — CMP (CANCER CENTER ONLY)
ALT: 25 U/L (ref 0–44)
ANION GAP: 7 (ref 5–15)
AST: 19 U/L (ref 15–41)
Albumin: 3.3 g/dL — ABNORMAL LOW (ref 3.5–5.0)
Alkaline Phosphatase: 133 U/L — ABNORMAL HIGH (ref 38–126)
BUN: 9 mg/dL (ref 8–23)
CO2: 23 mmol/L (ref 22–32)
Calcium: 9.2 mg/dL (ref 8.9–10.3)
Chloride: 111 mmol/L (ref 98–111)
Creatinine: 1.15 mg/dL (ref 0.61–1.24)
GFR, EST NON AFRICAN AMERICAN: 58 mL/min — AB (ref >60)
Glucose, Bld: 116 mg/dL — ABNORMAL HIGH (ref 70–99)
POTASSIUM: 3.7 mmol/L (ref 3.5–5.1)
Sodium: 141 mmol/L (ref 135–145)
Total Bilirubin: 0.3 mg/dL (ref 0.3–1.2)
Total Protein: 7.1 g/dL (ref 6.5–8.1)

## 2018-01-19 MED ORDER — HEPARIN SOD (PORK) LOCK FLUSH 100 UNIT/ML IV SOLN
500.0000 [IU] | Freq: Once | INTRAVENOUS | Status: AC
  Administered 2018-01-19: 500 [IU] via INTRAVENOUS
  Filled 2018-01-19: qty 5

## 2018-01-19 MED ORDER — SODIUM CHLORIDE 0.9% FLUSH
10.0000 mL | Freq: Once | INTRAVENOUS | Status: AC
  Administered 2018-01-19: 10 mL via INTRAVENOUS
  Filled 2018-01-19: qty 10

## 2018-01-20 ENCOUNTER — Ambulatory Visit: Payer: Medicare Other

## 2018-01-25 ENCOUNTER — Telehealth: Payer: Self-pay | Admitting: *Deleted

## 2018-01-25 NOTE — Telephone Encounter (Signed)
Returned call tp pt wife regarding pain management. Unable to reach advised pt discuss pain concerns and medication with MD at 11/21 visit

## 2018-01-26 ENCOUNTER — Inpatient Hospital Stay: Payer: Medicare Other | Admitting: Nutrition

## 2018-01-26 ENCOUNTER — Inpatient Hospital Stay: Payer: Medicare Other

## 2018-01-26 ENCOUNTER — Telehealth: Payer: Self-pay | Admitting: Oncology

## 2018-01-26 ENCOUNTER — Inpatient Hospital Stay (HOSPITAL_BASED_OUTPATIENT_CLINIC_OR_DEPARTMENT_OTHER): Payer: Medicare Other | Admitting: Oncology

## 2018-01-26 ENCOUNTER — Encounter: Payer: Self-pay | Admitting: Oncology

## 2018-01-26 ENCOUNTER — Other Ambulatory Visit: Payer: Self-pay | Admitting: *Deleted

## 2018-01-26 VITALS — BP 133/81 | HR 79 | Temp 98.7°F | Resp 18 | Ht 68.0 in | Wt 150.6 lb

## 2018-01-26 DIAGNOSIS — Z9221 Personal history of antineoplastic chemotherapy: Secondary | ICD-10-CM

## 2018-01-26 DIAGNOSIS — Z7689 Persons encountering health services in other specified circumstances: Secondary | ICD-10-CM | POA: Diagnosis not present

## 2018-01-26 DIAGNOSIS — M549 Dorsalgia, unspecified: Secondary | ICD-10-CM

## 2018-01-26 DIAGNOSIS — E7800 Pure hypercholesterolemia, unspecified: Secondary | ICD-10-CM

## 2018-01-26 DIAGNOSIS — Z95828 Presence of other vascular implants and grafts: Secondary | ICD-10-CM

## 2018-01-26 DIAGNOSIS — G8929 Other chronic pain: Secondary | ICD-10-CM

## 2018-01-26 DIAGNOSIS — Z5111 Encounter for antineoplastic chemotherapy: Secondary | ICD-10-CM

## 2018-01-26 DIAGNOSIS — C3491 Malignant neoplasm of unspecified part of right bronchus or lung: Secondary | ICD-10-CM

## 2018-01-26 DIAGNOSIS — C342 Malignant neoplasm of middle lobe, bronchus or lung: Secondary | ICD-10-CM | POA: Diagnosis not present

## 2018-01-26 DIAGNOSIS — Z7982 Long term (current) use of aspirin: Secondary | ICD-10-CM

## 2018-01-26 DIAGNOSIS — I10 Essential (primary) hypertension: Secondary | ICD-10-CM

## 2018-01-26 DIAGNOSIS — Z79899 Other long term (current) drug therapy: Secondary | ICD-10-CM

## 2018-01-26 DIAGNOSIS — G629 Polyneuropathy, unspecified: Secondary | ICD-10-CM | POA: Diagnosis not present

## 2018-01-26 LAB — CBC WITH DIFFERENTIAL (CANCER CENTER ONLY)
Abs Immature Granulocytes: 0.04 10*3/uL (ref 0.00–0.07)
BASOS ABS: 0 10*3/uL (ref 0.0–0.1)
Basophils Relative: 0 %
Eosinophils Absolute: 0.1 10*3/uL (ref 0.0–0.5)
Eosinophils Relative: 1 %
HEMATOCRIT: 32.5 % — AB (ref 39.0–52.0)
HEMOGLOBIN: 10.5 g/dL — AB (ref 13.0–17.0)
IMMATURE GRANULOCYTES: 0 %
LYMPHS ABS: 1.6 10*3/uL (ref 0.7–4.0)
LYMPHS PCT: 16 %
MCH: 31.6 pg (ref 26.0–34.0)
MCHC: 32.3 g/dL (ref 30.0–36.0)
MCV: 97.9 fL (ref 80.0–100.0)
MONOS PCT: 12 %
Monocytes Absolute: 1.2 10*3/uL — ABNORMAL HIGH (ref 0.1–1.0)
NEUTROS PCT: 71 %
NRBC: 0 % (ref 0.0–0.2)
Neutro Abs: 7.4 10*3/uL (ref 1.7–7.7)
Platelet Count: 163 10*3/uL (ref 150–400)
RBC: 3.32 MIL/uL — ABNORMAL LOW (ref 4.22–5.81)
RDW: 17.6 % — ABNORMAL HIGH (ref 11.5–15.5)
WBC Count: 10.3 10*3/uL (ref 4.0–10.5)

## 2018-01-26 LAB — CMP (CANCER CENTER ONLY)
ALT: 12 U/L (ref 0–44)
ANION GAP: 7 (ref 5–15)
AST: 16 U/L (ref 15–41)
Albumin: 3.2 g/dL — ABNORMAL LOW (ref 3.5–5.0)
Alkaline Phosphatase: 96 U/L (ref 38–126)
BILIRUBIN TOTAL: 0.4 mg/dL (ref 0.3–1.2)
BUN: 11 mg/dL (ref 8–23)
CALCIUM: 9.5 mg/dL (ref 8.9–10.3)
CHLORIDE: 111 mmol/L (ref 98–111)
CO2: 25 mmol/L (ref 22–32)
Creatinine: 1.08 mg/dL (ref 0.61–1.24)
GLUCOSE: 97 mg/dL (ref 70–99)
POTASSIUM: 4.1 mmol/L (ref 3.5–5.1)
Sodium: 143 mmol/L (ref 135–145)
Total Protein: 7.1 g/dL (ref 6.5–8.1)

## 2018-01-26 MED ORDER — SODIUM CHLORIDE 0.9% FLUSH
10.0000 mL | INTRAVENOUS | Status: DC | PRN
  Filled 2018-01-26: qty 10

## 2018-01-26 MED ORDER — HEPARIN SOD (PORK) LOCK FLUSH 100 UNIT/ML IV SOLN
500.0000 [IU] | Freq: Once | INTRAVENOUS | Status: DC
  Filled 2018-01-26: qty 5

## 2018-01-26 MED ORDER — SODIUM CHLORIDE 0.9% FLUSH
10.0000 mL | INTRAVENOUS | Status: DC | PRN
  Administered 2018-01-26: 10 mL via INTRAVENOUS
  Filled 2018-01-26: qty 10

## 2018-01-26 MED ORDER — HEPARIN SOD (PORK) LOCK FLUSH 100 UNIT/ML IV SOLN
500.0000 [IU] | Freq: Once | INTRAVENOUS | Status: AC
  Administered 2018-01-26: 500 [IU] via INTRAVENOUS
  Filled 2018-01-26: qty 5

## 2018-01-26 NOTE — Patient Instructions (Signed)
Implanted Port Home Guide An implanted port is a type of central line that is placed under the skin. Central lines are used to provide IV access when treatment or nutrition needs to be given through a person's veins. Implanted ports are used for long-term IV access. An implanted port may be placed because:  You need IV medicine that would be irritating to the small veins in your hands or arms.  You need long-term IV medicines, such as antibiotics.  You need IV nutrition for a long period.  You need frequent blood draws for lab tests.  You need dialysis.  Implanted ports are usually placed in the chest area, but they can also be placed in the upper arm, the abdomen, or the leg. An implanted port has two main parts:  Reservoir. The reservoir is round and will appear as a small, raised area under your skin. The reservoir is the part where a needle is inserted to give medicines or draw blood.  Catheter. The catheter is a thin, flexible tube that extends from the reservoir. The catheter is placed into a large vein. Medicine that is inserted into the reservoir goes into the catheter and then into the vein.  How will I care for my incision site? Do not get the incision site wet. Bathe or shower as directed by your health care provider. How is my port accessed? Special steps must be taken to access the port:  Before the port is accessed, a numbing cream can be placed on the skin. This helps numb the skin over the port site.  Your health care provider uses a sterile technique to access the port. ? Your health care provider must put on a mask and sterile gloves. ? The skin over your port is cleaned carefully with an antiseptic and allowed to dry. ? The port is gently pinched between sterile gloves, and a needle is inserted into the port.  Only "non-coring" port needles should be used to access the port. Once the port is accessed, a blood return should be checked. This helps ensure that the port  is in the vein and is not clogged.  If your port needs to remain accessed for a constant infusion, a clear (transparent) bandage will be placed over the needle site. The bandage and needle will need to be changed every week, or as directed by your health care provider.  Keep the bandage covering the needle clean and dry. Do not get it wet. Follow your health care provider's instructions on how to take a shower or bath while the port is accessed.  If your port does not need to stay accessed, no bandage is needed over the port.  What is flushing? Flushing helps keep the port from getting clogged. Follow your health care provider's instructions on how and when to flush the port. Ports are usually flushed with saline solution or a medicine called heparin. The need for flushing will depend on how the port is used.  If the port is used for intermittent medicines or blood draws, the port will need to be flushed: ? After medicines have been given. ? After blood has been drawn. ? As part of routine maintenance.  If a constant infusion is running, the port may not need to be flushed.  How long will my port stay implanted? The port can stay in for as long as your health care provider thinks it is needed. When it is time for the port to come out, surgery will be   done to remove it. The procedure is similar to the one performed when the port was put in. When should I seek immediate medical care? When you have an implanted port, you should seek immediate medical care if:  You notice a bad smell coming from the incision site.  You have swelling, redness, or drainage at the incision site.  You have more swelling or pain at the port site or the surrounding area.  You have a fever that is not controlled with medicine.  This information is not intended to replace advice given to you by your health care provider. Make sure you discuss any questions you have with your health care provider. Document  Released: 02/22/2005 Document Revised: 07/31/2015 Document Reviewed: 10/30/2012 Elsevier Interactive Patient Education  2017 [employer]  

## 2018-01-26 NOTE — Progress Notes (Signed)
[name] OFFICE PROGRESS NOTE  [address], [place] [address]  DIAGNOSIS:Stage IIB (T3, N0, M0) non-small cell lung cancer, invasive well-differentiated squamous cell carcinoma presented with right middle lobe lung mass.  PRIOR THERAPY: Status post right VATS, right middle lobectomy with mediastinal lymph node sampling under the care of Dr. Roxan Hockey on 09/19/2017. The tumor measured 4.2 cm but the carcinoma extends through the visceral pleura.  CURRENT THERAPY: Adjuvant systemic chemotherapy with carboplatin for AUC of 6 and paclitaxel 200 mg/M2 every 3 weeks. Carboplatin dose was reduced to an AUC of 5 and paclitaxel was reduced to 175 mg meter squared beginning with cycle #3 secondary to peripheral neuropathy.  First dose 11/16/2017.Status post 3 cycles.  INTERVAL HISTORY: Danny Duke 82 y.o. male returns for routine follow-up visit accompanied by his wife.  The patient reports that he is feeling fairly well but continues to experience pain to his feet.  He reports that the pain is achy at times feels numb.  He was placed on gabapentin since his last visit and reports mild improvement.  Denies fevers and chills.  Denies chest pain, shortness of breath, cough, hemoptysis.  Denies nausea, vomiting, constipation, diarrhea.  Denies recent weight loss or night sweats.  The patient is here for evaluation prior to cycle #4 of his chemotherapy.  MEDICAL HISTORY: Past Medical History:  Diagnosis Date  . Cancer [facility])    possible lung cancer  . Chronic back pain   . Hypercholesterolemia   . Hypertension     ALLERGIES:  has No Known Allergies.  MEDICATIONS:  Current Outpatient Medications  Medication Sig Dispense Refill  . amLODipine (NORVASC) 10 MG tablet Take 10 mg by mouth daily.    Marland Kitchen aspirin EC 81 MG tablet Take 81 mg by mouth daily.    . chlorproMAZINE (THORAZINE) 25 MG tablet Take 1 tablet (25 mg total) by mouth 3 (three) times  daily as needed. 30 tablet 0  . gabapentin (NEURONTIN) 300 MG capsule Take 1 capsule (300 mg total) by mouth 3 (three) times daily. 90 capsule 0  . lisinopril (PRINIVIL,ZESTRIL) 40 MG tablet Take 40 mg by mouth daily.  1  . metoprolol succinate (TOPROL-XL) 25 MG 24 hr tablet Take 1 tablet (25 mg total) by mouth daily. 60 tablet 1  . Oxycodone HCl 20 MG TABS Take 1 tablet by mouth every 6 (six) hours as needed.  0  . prochlorperazine (COMPAZINE) 10 MG tablet Take 1 tablet (10 mg total) by mouth every 6 (six) hours as needed for nausea or vomiting. 30 tablet 0  . simvastatin (ZOCOR) 20 MG tablet Take 20 mg by mouth daily.    . tadalafil (CIALIS) 5 MG tablet Take 5 mg by mouth daily.  12   Current Facility-Administered Medications  Medication Dose Route Frequency Provider Last Rate Last Dose  . heparin lock flush 100 unit/mL  500 Units Intravenous Once ,  R, NP      . sodium chloride flush (NS) 0.9 % injection 10 mL  10 mL Intravenous PRN Maryanna Shape, NP   10 mL at 01/26/18 6073    SURGICAL HISTORY:  Past Surgical History:  Procedure Laterality Date  . BRONCHIAL NEEDLE ASPIRATION BIOPSY  08/05/2017   Procedure: BRONCHIAL NEEDLE ASPIRATION BIOPSIES;  Surgeon: Juanito Doom, MD;  Location: [facility];  Service: Cardiopulmonary;;  . ENDOBRONCHIAL ULTRASOUND Bilateral 08/05/2017   Procedure: ENDOBRONCHIAL ULTRASOUND;  Surgeon: Juanito Doom, MD;  Location: [facility];  Service: Cardiopulmonary;  Laterality: Bilateral;  . IR IMAGING GUIDED PORT INSERTION  12/27/2017  . MEDIASTINOSCOPY N/A 09/19/2017   Procedure: MEDIASTINOSCOPY;  Surgeon: [name], MD;  Location: [facility] OR;  Service: Thoracic;  Laterality: N/A;  . MULTIPLE TOOTH EXTRACTIONS    . VIDEO ASSISTED THORACOSCOPY (VATS)/ LOBECTOMY Right 09/19/2017   Procedure: RIGHT VIDEO ASSISTED THORACOSCOPY (VATS)/ LOBECTOMY;  Surgeon: [name], MD;  Location: [facility];  Service: Thoracic;  Laterality:  Right;    REVIEW OF SYSTEMS:   Review of Systems  Constitutional: Negative for appetite change, chills, fatigue, fever and unexpected weight change.  HENT:   Negative for mouth sores, nosebleeds, sore throat and trouble swallowing.   Eyes: Negative for eye problems and icterus.  Respiratory: Negative for cough, hemoptysis, shortness of breath and wheezing.   Cardiovascular: Negative for chest pain and leg swelling.  Gastrointestinal: Negative for abdominal pain, constipation, diarrhea, nausea and vomiting.  Genitourinary: Negative for bladder incontinence, difficulty urinating, dysuria, frequency and hematuria.   Musculoskeletal: Negative for back pain, gait problem, neck pain and neck stiffness.  Positive for pain to his legs and feet. Skin: Negative for itching and rash.  Neurological: Negative for dizziness, extremity weakness, gait problem, headaches, light-headedness and seizures.  Positive for numbness to the feet. Hematological: Negative for adenopathy. Does not bruise/bleed easily.  Psychiatric/Behavioral: Negative for confusion, depression and sleep disturbance. The patient is not nervous/anxious.     PHYSICAL EXAMINATION:  Blood pressure 133/81, pulse 79, temperature 98.7 F (37.1 C), temperature source Oral, resp. rate 18, height 5\' 8"  (1.727 m), weight 150 lb 9.6 oz (68.3 kg), SpO2 100 %.  ECOG PERFORMANCE STATUS: 1 - Symptomatic but completely ambulatory  Physical Exam  Constitutional: Oriented to person, place, and time and well-developed, well-nourished, and in no distress. No distress.  HENT:  Head: Normocephalic and atraumatic.  Mouth/Throat: Oropharynx is clear and moist. No oropharyngeal exudate.  Eyes: Conjunctivae are normal. Right eye exhibits no discharge. Left eye exhibits no discharge. No scleral icterus.  Neck: Normal range of motion. Neck supple.  Cardiovascular: Normal rate, regular rhythm, normal heart sounds and intact distal pulses.   Pulmonary/Chest:  Effort normal and breath sounds normal. No respiratory distress. No wheezes. No rales.  Abdominal: Soft. Bowel sounds are normal. Exhibits no distension and no mass. There is no tenderness.  Musculoskeletal: Normal range of motion. Exhibits no edema.  Lymphadenopathy:    No cervical adenopathy.  Neurological: Alert and oriented to person, place, and time. Exhibits normal muscle tone. Gait normal. Coordination normal.  Mild decrease in vibratory sensation to the bilateral hands.  Moderate decrease in vibratory sensation to the bilateral feet left greater than right. Skin: Skin is warm and dry. No rash noted. Not diaphoretic. No erythema. No pallor.  Psychiatric: Mood, memory and judgment normal.  Vitals reviewed.  LABORATORY DATA: Lab Results  Component Value Date   WBC 10.3 01/26/2018   HGB 10.5 (L) 01/26/2018   HCT 32.5 (L) 01/26/2018   MCV 97.9 01/26/2018   PLT 163 01/26/2018      Chemistry      Component Value Date/Time   NA 143 01/26/2018 0756   K 4.1 01/26/2018 0756   CL 111 01/26/2018 0756   CO2 25 01/26/2018 0756   BUN 11 01/26/2018 0756   CREATININE 1.08 01/26/2018 0756      Component Value Date/Time   CALCIUM 9.5 01/26/2018 0756   ALKPHOS 96 01/26/2018 0756   AST 16 01/26/2018 0756   ALT  12 01/26/2018 0756   BILITOT 0.4 01/26/2018 0756       RADIOGRAPHIC STUDIES:  No results found.   ASSESSMENT/PLAN:  Squamous cell carcinoma lung, right ([place]) This is a very pleasant 82 year old African-American male recently diagnosed with stage IIB (T3, N0, M0) invasive well-differentiated squamous cell carcinoma presented with right middle lobe lung mass status post right middle lobectomy with lymph node sampling on September 19, 2017 under the care of Dr. Roxan Hockey. The patient is currently undergoing adjuvant treatment with systemic chemotherapy with carboplatin for AUC of 6 and paclitaxel 200 mg/M2 every 3 weeks with Neulasta support.  Carboplatin dose was reduced to an AUC  of 5 and paclitaxel was reduced to 175 mg meter squared starting with cycle #3 secondary to peripheral neuropathy.  Status post 3 cycles.    He is tolerating his treatment well overall with the exception of peripheral neuropathy and arthralgias secondary to Neulasta.  The patient reports increased peripheral neuropathy despite dose reduction with cycle 3.  He has decreased vibratory sensation on exam today.  Patient was reviewed with Dr. Julien Nordmann.  Recommend for him to discontinue his chemotherapy at this time due to concern over irreversible peripheral neuropathy that could interfere with his functional status.  The patient and his wife are in agreement to this plan.  He will follow-up in 1 month for evaluation with repeat lab work and a CT scan of the chest prior to that visit.  For the peripheral neuropathy, he will continue gabapentin.  The patient was advised to call immediately if he has any concerning symptoms in the interval. The patient voices understanding of current disease status and treatment options and is in agreement with the current care plan.  All questions were answered. The patient knows to call the clinic with any problems, questions or concerns. We can certainly see the patient much sooner if necessary.   Orders Placed This Encounter  Procedures  . CT CHEST W CONTRAST    Standing Status:   Future    Standing Expiration Date:   01/27/2019    Order Specific Question:   If indicated for the ordered procedure, I authorize the administration of contrast media per Radiology protocol    Answer:   Yes    Order Specific Question:   Preferred imaging location?    Answer:   [facility]    Order Specific Question:   Radiology Contrast Protocol - do NOT remove file path    Answer:   [email or web address]    Order Specific Question:   ** REASON FOR EXAM (FREE TEXT)    Answer:   Lung cancer. Restaging.     Mikey Bussing, DNP, AGPCNP-BC,  AOCNP 01/26/18

## 2018-01-26 NOTE — Telephone Encounter (Signed)
Gave avs and calendar ° °

## 2018-01-26 NOTE — Assessment & Plan Note (Addendum)
This is a very pleasant 82 year old African-American male recently diagnosed with stage IIB (T3, N0, M0) invasive well-differentiated squamous cell carcinoma presented with right middle lobe lung mass status post right middle lobectomy with lymph node sampling on September 19, 2017 under the care of Dr. Roxan Hockey. The patient is currently undergoing adjuvant treatment with systemic chemotherapy with carboplatin for AUC of 6 and paclitaxel 200 mg/M2 every 3 weeks with Neulasta support.  Carboplatin dose was reduced to an AUC of 5 and paclitaxel was reduced to 175 mg meter squared starting with cycle #3 secondary to peripheral neuropathy.  Status post 3 cycles.    He is tolerating his treatment well overall with the exception of peripheral neuropathy and arthralgias secondary to Neulasta.  The patient reports increased peripheral neuropathy despite dose reduction with cycle 3.  He has decreased vibratory sensation on exam today.  Patient was reviewed with Dr. Julien Nordmann.  Recommend for him to discontinue his chemotherapy at this time due to concern over irreversible peripheral neuropathy that could interfere with his functional status.  The patient and his wife are in agreement to this plan.  He will follow-up in 1 month for evaluation with repeat lab work and a CT scan of the chest prior to that visit.  For the peripheral neuropathy, he will continue gabapentin.  The patient was advised to call immediately if he has any concerning symptoms in the interval. The patient voices understanding of current disease status and treatment options and is in agreement with the current care plan.  All questions were answered. The patient knows to call the clinic with any problems, questions or concerns. We can certainly see the patient much sooner if necessary.

## 2018-01-28 ENCOUNTER — Inpatient Hospital Stay: Payer: Medicare Other

## 2018-01-31 ENCOUNTER — Other Ambulatory Visit: Payer: Self-pay | Admitting: Surgical

## 2018-02-01 ENCOUNTER — Other Ambulatory Visit: Payer: Medicare Other

## 2018-02-03 ENCOUNTER — Other Ambulatory Visit: Payer: Self-pay | Admitting: Medical Oncology

## 2018-02-03 ENCOUNTER — Telehealth: Payer: Self-pay | Admitting: Medical Oncology

## 2018-02-03 DIAGNOSIS — G620 Drug-induced polyneuropathy: Secondary | ICD-10-CM

## 2018-02-03 MED ORDER — GABAPENTIN 300 MG PO CAPS: 600.0000 mg | ORAL_CAPSULE | Freq: Three times a day (TID) | ORAL | 0 refills | Status: DC

## 2018-02-03 NOTE — Telephone Encounter (Signed)
He can increase to 600 mg TID. Ok to send new Rx to his pharmacy.  Thanks

## 2018-02-03 NOTE — Telephone Encounter (Signed)
Worsening Peripheral neuropathy . Chemo stopped due to PN. Yesterday feet started swelling and  toes are numb. He is walking okay, no falling. He is taking gabapentin every 6 hours .

## 2018-02-03 NOTE — Telephone Encounter (Signed)
Wife notified and new rx sent to local pharmacy [facility]

## 2018-02-08 ENCOUNTER — Ambulatory Visit: Payer: Medicare Other | Admitting: Nurse Practitioner

## 2018-02-08 ENCOUNTER — Ambulatory Visit: Payer: Medicare Other

## 2018-02-08 ENCOUNTER — Other Ambulatory Visit: Payer: Medicare Other

## 2018-02-18 ENCOUNTER — Other Ambulatory Visit: Payer: Self-pay | Admitting: Surgical

## 2018-02-20 ENCOUNTER — Telehealth: Payer: Self-pay

## 2018-02-20 NOTE — Telephone Encounter (Signed)
Spoke with patient's wife in regards to refills on future medications of metoprolol.  Advised to contact PCP to get further refills of medications.  She acknowledged receipt and thanked me for the return call.  She also stated he has an appointment with his PCP next week.

## 2018-02-24 ENCOUNTER — Ambulatory Visit (HOSPITAL_COMMUNITY)
Admission: RE | Admit: 2018-02-24 | Discharge: 2018-02-24 | Disposition: A | Discharge status: Home / Self Care | Payer: Medicare Other | Source: Ambulatory Visit | Attending: Oncology | Admitting: Oncology

## 2018-02-24 ENCOUNTER — Inpatient Hospital Stay: Payer: Medicare Other

## 2018-02-24 ENCOUNTER — Inpatient Hospital Stay: Payer: Medicare Other | Attending: Internal Medicine

## 2018-02-24 DIAGNOSIS — C3491 Malignant neoplasm of unspecified part of right bronchus or lung: Secondary | ICD-10-CM

## 2018-02-24 DIAGNOSIS — C342 Malignant neoplasm of middle lobe, bronchus or lung: Secondary | ICD-10-CM | POA: Diagnosis present

## 2018-02-24 LAB — CBC WITH DIFFERENTIAL (CANCER CENTER ONLY)
Abs Immature Granulocytes: 0.01 10*3/uL (ref 0.00–0.07)
BASOS PCT: 0 %
Basophils Absolute: 0 10*3/uL (ref 0.0–0.1)
Eosinophils Absolute: 0.3 10*3/uL (ref 0.0–0.5)
Eosinophils Relative: 5 %
HCT: 36 % — ABNORMAL LOW (ref 39.0–52.0)
Hemoglobin: 11.4 g/dL — ABNORMAL LOW (ref 13.0–17.0)
IMMATURE GRANULOCYTES: 0 %
Lymphocytes Relative: 25 %
Lymphs Abs: 1.4 10*3/uL (ref 0.7–4.0)
MCH: 31.5 pg (ref 26.0–34.0)
MCHC: 31.7 g/dL (ref 30.0–36.0)
MCV: 99.4 fL (ref 80.0–100.0)
MONOS PCT: 11 %
Monocytes Absolute: 0.6 10*3/uL (ref 0.1–1.0)
Neutro Abs: 3.4 10*3/uL (ref 1.7–7.7)
Neutrophils Relative %: 59 %
PLATELETS: 107 10*3/uL — AB (ref 150–400)
RBC: 3.62 MIL/uL — AB (ref 4.22–5.81)
RDW: 13.8 % (ref 11.5–15.5)
WBC Count: 5.7 10*3/uL (ref 4.0–10.5)
nRBC: 0 % (ref 0.0–0.2)

## 2018-02-24 LAB — CMP (CANCER CENTER ONLY)
ALBUMIN: 3.4 g/dL — AB (ref 3.5–5.0)
ALT: 7 U/L (ref 0–44)
AST: 17 U/L (ref 15–41)
Alkaline Phosphatase: 75 U/L (ref 38–126)
Anion gap: 7 (ref 5–15)
BILIRUBIN TOTAL: 0.5 mg/dL (ref 0.3–1.2)
BUN: 11 mg/dL (ref 8–23)
CO2: 24 mmol/L (ref 22–32)
Calcium: 9.7 mg/dL (ref 8.9–10.3)
Chloride: 109 mmol/L (ref 98–111)
Creatinine: 1.11 mg/dL (ref 0.61–1.24)
GFR, Est AFR Am: >60 mL/min (ref >60)
GFR, Estimated: >60 mL/min (ref >60)
GLUCOSE: 98 mg/dL (ref 70–99)
POTASSIUM: 4 mmol/L (ref 3.5–5.1)
SODIUM: 140 mmol/L (ref 135–145)
TOTAL PROTEIN: 7.5 g/dL (ref 6.5–8.1)

## 2018-02-24 MED ORDER — HEPARIN SOD (PORK) LOCK FLUSH 100 UNIT/ML IV SOLN
INTRAVENOUS | Status: AC
  Filled 2018-02-24: qty 5

## 2018-02-24 MED ORDER — SODIUM CHLORIDE 0.9% FLUSH
10.0000 mL | INTRAVENOUS | Status: DC | PRN
  Administered 2018-02-24: 10 mL
  Filled 2018-02-24: qty 10

## 2018-02-24 MED ORDER — SODIUM CHLORIDE (PF) 0.9 % IJ SOLN
INTRAMUSCULAR | Status: AC
  Filled 2018-02-24: qty 50

## 2018-02-24 MED ORDER — IOHEXOL 300 MG/ML  SOLN
100.0000 mL | Freq: Once | INTRAMUSCULAR | Status: AC | PRN
  Administered 2018-02-24: 75 mL via INTRAVENOUS

## 2018-02-24 MED ORDER — HEPARIN SOD (PORK) LOCK FLUSH 100 UNIT/ML IV SOLN
500.0000 [IU] | Freq: Once | INTRAVENOUS | Status: AC
  Administered 2018-02-24: 500 [IU] via INTRAVENOUS

## 2018-02-27 ENCOUNTER — Telehealth: Payer: Self-pay | Admitting: Internal Medicine

## 2018-02-27 NOTE — Telephone Encounter (Signed)
MM PAL - per MM moved fu from 12/31 to 1/10. Spoke with wife.

## 2018-03-07 ENCOUNTER — Ambulatory Visit: Payer: Medicare Other | Admitting: Internal Medicine

## 2018-03-17 ENCOUNTER — Inpatient Hospital Stay: Payer: Medicare Other | Attending: Internal Medicine | Admitting: Internal Medicine

## 2018-03-17 ENCOUNTER — Encounter: Payer: Self-pay | Admitting: *Deleted

## 2018-03-17 ENCOUNTER — Encounter: Payer: Self-pay | Admitting: Internal Medicine

## 2018-03-17 VITALS — BP 125/77 | HR 86 | Temp 98.0°F | Resp 17 | Ht 68.0 in | Wt 153.6 lb

## 2018-03-17 DIAGNOSIS — I2510 Atherosclerotic heart disease of native coronary artery without angina pectoris: Secondary | ICD-10-CM

## 2018-03-17 DIAGNOSIS — E7800 Pure hypercholesterolemia, unspecified: Secondary | ICD-10-CM | POA: Diagnosis not present

## 2018-03-17 DIAGNOSIS — C342 Malignant neoplasm of middle lobe, bronchus or lung: Secondary | ICD-10-CM | POA: Insufficient documentation

## 2018-03-17 DIAGNOSIS — Z5111 Encounter for antineoplastic chemotherapy: Secondary | ICD-10-CM

## 2018-03-17 DIAGNOSIS — M549 Dorsalgia, unspecified: Secondary | ICD-10-CM | POA: Diagnosis not present

## 2018-03-17 DIAGNOSIS — J439 Emphysema, unspecified: Secondary | ICD-10-CM | POA: Insufficient documentation

## 2018-03-17 DIAGNOSIS — G629 Polyneuropathy, unspecified: Secondary | ICD-10-CM | POA: Diagnosis not present

## 2018-03-17 DIAGNOSIS — Z79899 Other long term (current) drug therapy: Secondary | ICD-10-CM | POA: Insufficient documentation

## 2018-03-17 DIAGNOSIS — C3491 Malignant neoplasm of unspecified part of right bronchus or lung: Secondary | ICD-10-CM

## 2018-03-17 DIAGNOSIS — R599 Enlarged lymph nodes, unspecified: Secondary | ICD-10-CM | POA: Diagnosis not present

## 2018-03-17 DIAGNOSIS — Z902 Acquired absence of lung [part of]: Secondary | ICD-10-CM

## 2018-03-17 DIAGNOSIS — Z7982 Long term (current) use of aspirin: Secondary | ICD-10-CM | POA: Diagnosis not present

## 2018-03-17 DIAGNOSIS — I10 Essential (primary) hypertension: Secondary | ICD-10-CM | POA: Diagnosis not present

## 2018-03-17 DIAGNOSIS — Z9221 Personal history of antineoplastic chemotherapy: Secondary | ICD-10-CM | POA: Insufficient documentation

## 2018-03-17 DIAGNOSIS — C3490 Malignant neoplasm of unspecified part of unspecified bronchus or lung: Secondary | ICD-10-CM

## 2018-03-17 DIAGNOSIS — I700 Atherosclerosis of aorta: Secondary | ICD-10-CM | POA: Diagnosis not present

## 2018-03-17 NOTE — Progress Notes (Signed)
[facility] [phone]   [phone]  OFFICE PROGRESS NOTE  [address], [place] [address]  DIAGNOSIS: Stage IIB (T3, N0, M0) non-small cell lung cancer, invasive well-differentiated squamous cell carcinoma presented with right middle lobe lung mass.  PRIOR THERAPY:  1) Status post right VATS, right middle lobectomy with mediastinal lymph node sampling under the care of Dr. Roxan Hockey on 09/19/2017.  The tumor measured 4.2 cm but the carcinoma extends through the visceral pleura. 2) Adjuvant systemic chemotherapy with carboplatin for AUC of 6 and paclitaxel 200 mg/M2 every 3 weeks.  First dose 11/16/2017.  Status post 3 cycles.  Last dose was giving January 05, 2018 discontinued secondary to intolerance with significant peripheral neuropathy.  CURRENT THERAPY: Observation.  INTERVAL HISTORY: Danny Duke 83 y.o. male returns to the clinic today for follow-up visit accompanied by his daughter.  The patient is feeling fine today with no concerning complaints.  He tolerated his adjuvant therapy fairly well except for chemotherapy-induced peripheral neuropathy.  He discontinued the treatment after cycle #3 because of the peripheral neuropathy.  He is feeling much better now.  He denied having any chest pain, shortness of breath, cough or hemoptysis.  He denied having any fever or chills.  He has no nausea, vomiting, diarrhea or constipation.  He has no headache or visual changes.  The patient had repeat CT scan of the chest performed recently and he is here for evaluation and discussion of his discuss results.   MEDICAL HISTORY: Past Medical History:  Diagnosis Date  . Cancer [facility])    possible lung cancer  . Chronic back pain   . Hypercholesterolemia   . Hypertension     ALLERGIES:  has No Known Allergies.  MEDICATIONS:  Current Outpatient Medications  Medication Sig Dispense Refill  . amLODipine (NORVASC) 10 MG  tablet Take 10 mg by mouth daily.    Marland Kitchen aspirin EC 81 MG tablet Take 81 mg by mouth daily.    . chlorproMAZINE (THORAZINE) 25 MG tablet Take 1 tablet (25 mg total) by mouth 3 (three) times daily as needed. 30 tablet 0  . gabapentin (NEURONTIN) 300 MG capsule Take 2 capsules (600 mg total) by mouth 3 (three) times daily. 90 capsule 0  . lisinopril (PRINIVIL,ZESTRIL) 40 MG tablet Take 40 mg by mouth daily.  1  . metoprolol succinate (TOPROL-XL) 25 MG 24 hr tablet Take 1 tablet (25 mg total) by mouth daily. 60 tablet 1  . Oxycodone HCl 20 MG TABS Take 1 tablet by mouth every 6 (six) hours as needed.  0  . prochlorperazine (COMPAZINE) 10 MG tablet Take 1 tablet (10 mg total) by mouth every 6 (six) hours as needed for nausea or vomiting. 30 tablet 0  . simvastatin (ZOCOR) 20 MG tablet Take 20 mg by mouth daily.    . tadalafil (CIALIS) 5 MG tablet Take 5 mg by mouth daily.  12   No current facility-administered medications for this visit.     SURGICAL HISTORY:  Past Surgical History:  Procedure Laterality Date  . BRONCHIAL NEEDLE ASPIRATION BIOPSY  08/05/2017   Procedure: BRONCHIAL NEEDLE ASPIRATION BIOPSIES;  Surgeon: Juanito Doom, MD;  Location: [facility];  Service: Cardiopulmonary;;  . ENDOBRONCHIAL ULTRASOUND Bilateral 08/05/2017   Procedure: ENDOBRONCHIAL ULTRASOUND;  Surgeon: Juanito Doom, MD;  Location: [facility];  Service: Cardiopulmonary;  Laterality: Bilateral;  . IR IMAGING GUIDED PORT INSERTION  12/27/2017  . MEDIASTINOSCOPY N/A  09/19/2017   Procedure: MEDIASTINOSCOPY;  Surgeon: [name], MD;  Location: [facility];  Service: Thoracic;  Laterality: N/A;  . MULTIPLE TOOTH EXTRACTIONS    . VIDEO ASSISTED THORACOSCOPY (VATS)/ LOBECTOMY Right 09/19/2017   Procedure: RIGHT VIDEO ASSISTED THORACOSCOPY (VATS)/ LOBECTOMY;  Surgeon: [name], MD;  Location: [facility];  Service: Thoracic;  Laterality: Right;    REVIEW OF SYSTEMS:  Constitutional: negative Eyes:  negative Ears, nose, mouth, throat, and face: negative Respiratory: negative Cardiovascular: negative Gastrointestinal: negative Genitourinary:negative Integument/breast: negative Hematologic/lymphatic: negative Musculoskeletal:negative Neurological: positive for paresthesia Behavioral/Psych: negative Endocrine: negative Allergic/Immunologic: negative   PHYSICAL EXAMINATION: General appearance: alert, cooperative and no distress Head: Normocephalic, without obvious abnormality, atraumatic Neck: no adenopathy, no JVD, supple, symmetrical, trachea midline and thyroid not enlarged, symmetric, no tenderness/mass/nodules Lymph nodes: Cervical, supraclavicular, and axillary nodes normal. Resp: clear to auscultation bilaterally Back: symmetric, no curvature. ROM normal. No CVA tenderness. Cardio: regular rate and rhythm, S1, S2 normal, no murmur, click, rub or gallop GI: soft, non-tender; bowel sounds normal; no masses,  no organomegaly Extremities: extremities normal, atraumatic, no cyanosis or edema Neurologic: Alert and oriented X 3, normal strength and tone. Normal symmetric reflexes. Normal coordination and gait  ECOG PERFORMANCE STATUS: 1 - Symptomatic but completely ambulatory  Blood pressure 125/77, pulse 86, temperature 98 F (36.7 C), temperature source Oral, resp. rate 17, height 5\' 8"  (1.727 m), weight 153 lb 9.6 oz (69.7 kg), SpO2 100 %.  LABORATORY DATA: Lab Results  Component Value Date   WBC 5.7 02/24/2018   HGB 11.4 (L) 02/24/2018   HCT 36.0 (L) 02/24/2018   MCV 99.4 02/24/2018   PLT 107 (L) 02/24/2018      Chemistry      Component Value Date/Time   NA 140 02/24/2018 1013   K 4.0 02/24/2018 1013   CL 109 02/24/2018 1013   CO2 24 02/24/2018 1013   BUN 11 02/24/2018 1013   CREATININE 1.11 02/24/2018 1013      Component Value Date/Time   CALCIUM 9.7 02/24/2018 1013   ALKPHOS 75 02/24/2018 1013   AST 17 02/24/2018 1013   ALT 7 02/24/2018 1013   BILITOT 0.5  02/24/2018 1013       RADIOGRAPHIC STUDIES: Ct Chest W Contrast  Result Date: 02/24/2018 CLINICAL DATA:  Non-small cell lung cancer. EXAM: CT CHEST WITH CONTRAST TECHNIQUE: Multidetector CT imaging of the chest was performed during intravenous contrast administration. CONTRAST:  47mL OMNIPAQUE IOHEXOL 300 MG/ML  SOLN COMPARISON:  PET 07/05/2017 and CT chest 06/16/2017. FINDINGS: Cardiovascular: Right IJ Port-A-Cath terminates in the right atrium. Atherosclerotic calcification of the aorta and coronary arteries. Heart size normal. No pericardial effusion. Mediastinum/Nodes: Mediastinal lymph nodes measure up to 1.5 cm in the low right paratracheal station, similar. There are additional calcified mediastinal and hilar lymph nodes bilaterally. Noncalcified subcarinal lymph node measures 1.6 cm, stable. No axillary adenopathy. Air-fluid level is seen in a mildly dilated esophagus. Lungs/Pleura: Centrilobular and paraseptal emphysema. Nodular biapical pleuroparenchymal scarring, unchanged. Postoperative scarring and volume loss in the posterior segment right upper lobe. Right middle lobectomy. Pleural thickening in the posterior inferior right hemithorax. Peripheral left upper lobe nodule measures 4 mm (series 6, image 58), stable. No pleural fluid. Airway is otherwise unremarkable. Upper Abdomen: Subcentimeter low-attenuation lesions in the liver are too small to characterize. Visualized portions of the liver, gallbladder and adrenal glands are otherwise unremarkable. Low-attenuation lesions in the kidneys measure up to 2.8 cm on the left and are likely cysts although  the largest lesion is incompletely imaged. Spleen is unremarkable. Pancreatic duct measures up to 5 mm, as before. Pancreatic head and uncinate process are incompletely visualized. No gland atrophy. Visualized portions of the stomach and bowel are grossly unremarkable. No upper abdominal adenopathy. Musculoskeletal: Degenerative changes in the  spine. No worrisome lytic or sclerotic lesions. IMPRESSION: 1. No evidence of recurrent or metastatic disease. 2. Mediastinal adenopathy is stable. 3. Pancreatic ductal dilatation. Pancreatic head and uncinate process are incompletely visualized. An obstructing lesion can not be excluded. Consider further evaluation with MR or CT abdomen without and with contrast, as clinically indicated. 4. Aortic atherosclerosis (ICD10-170.0). Coronary artery calcification. 5. Emphysema (ICD10-J43.9). Electronically Signed   By: Lorin Picket M.D.   On: 02/24/2018 14:36    ASSESSMENT AND PLAN: This is a very pleasant 83 years old African-American male recently diagnosed with stage IIB (T3, N0, M0) invasive well-differentiated squamous cell carcinoma presented with right middle lobe lung mass status post right middle lobectomy with lymph node sampling on September 19, 2017 under the care of Dr. Roxan Hockey. The patient underwent adjuvant treatment with systemic chemotherapy with carboplatin for AUC of 6 and paclitaxel 200 mg/M2 every 3 weeks with Neulasta support status post 3 cycles. He tolerated this treatment well except for the chemotherapy-induced peripheral neuropathy and he discontinued his treatment after cycle #3. The patient is feeling much better now. He had repeat CT scan of the chest performed recently.  I personally and independently reviewed the scan and discussed the results with the patient and his daughter.  His a scan showed no concerning findings for disease recurrence or metastasis.  He has a pancreatic ductal dilatation that need close monitoring on the upcoming scan.  The patient is currently asymptomatic. I recommended for the patient to continue on observation for now with repeat CT scan of the chest, abdomen and pelvis in 3 months for evaluation of his lung cancer and the pancreatic ductal dilatation. He is requesting a permission to go to back to work and I do not see any concern with him returning  back to work at this point. The patient was advised to call immediately if he has any concerning symptoms in the interval. The patient voices understanding of current disease status and treatment options and is in agreement with the current care plan.  All questions were answered. The patient knows to call the clinic with any problems, questions or concerns. We can certainly see the patient much sooner if necessary.  I spent 15 minutes counseling the patient face to face. The total time spent in the appointment was 25 minutes.  Disclaimer: This note was dictated with voice recognition software. Similar sounding words can inadvertently be transcribed and may not be corrected upon review.

## 2018-03-20 ENCOUNTER — Telehealth: Payer: Self-pay | Admitting: Internal Medicine

## 2018-03-20 NOTE — Telephone Encounter (Signed)
Scheduled appt per 1/10 los - sent reminder letter in the mail with appt date and time

## 2018-05-08 ENCOUNTER — Telehealth: Payer: Self-pay | Admitting: Medical Oncology

## 2018-05-08 NOTE — Telephone Encounter (Signed)
Referral to Neurology or Dr. Mickeal Skinner

## 2018-05-08 NOTE — Telephone Encounter (Signed)
Worsening Peripheral Neuropathy-  Tingling and numbness in hands and feet. taking Gabapentin 600mg  q 8 hours.

## 2018-05-08 NOTE — Telephone Encounter (Signed)
Message sent to Dr Reche Dixon nurse and wife notified.

## 2018-05-10 ENCOUNTER — Ambulatory Visit: Payer: Medicare Other | Admitting: Internal Medicine

## 2018-05-18 ENCOUNTER — Other Ambulatory Visit: Payer: Self-pay

## 2018-05-18 ENCOUNTER — Inpatient Hospital Stay: Payer: Medicare Other | Attending: Internal Medicine | Admitting: Internal Medicine

## 2018-05-18 DIAGNOSIS — Z79899 Other long term (current) drug therapy: Secondary | ICD-10-CM | POA: Insufficient documentation

## 2018-05-18 DIAGNOSIS — E7800 Pure hypercholesterolemia, unspecified: Secondary | ICD-10-CM | POA: Insufficient documentation

## 2018-05-18 DIAGNOSIS — J439 Emphysema, unspecified: Secondary | ICD-10-CM | POA: Diagnosis not present

## 2018-05-18 DIAGNOSIS — M549 Dorsalgia, unspecified: Secondary | ICD-10-CM | POA: Diagnosis not present

## 2018-05-18 DIAGNOSIS — G8929 Other chronic pain: Secondary | ICD-10-CM | POA: Insufficient documentation

## 2018-05-18 DIAGNOSIS — Z7982 Long term (current) use of aspirin: Secondary | ICD-10-CM | POA: Insufficient documentation

## 2018-05-18 DIAGNOSIS — G620 Drug-induced polyneuropathy: Secondary | ICD-10-CM | POA: Diagnosis not present

## 2018-05-18 DIAGNOSIS — I10 Essential (primary) hypertension: Secondary | ICD-10-CM | POA: Diagnosis not present

## 2018-05-18 DIAGNOSIS — C342 Malignant neoplasm of middle lobe, bronchus or lung: Secondary | ICD-10-CM | POA: Diagnosis present

## 2018-05-18 DIAGNOSIS — Z87891 Personal history of nicotine dependence: Secondary | ICD-10-CM | POA: Insufficient documentation

## 2018-05-18 DIAGNOSIS — Z9221 Personal history of antineoplastic chemotherapy: Secondary | ICD-10-CM | POA: Diagnosis not present

## 2018-05-18 DIAGNOSIS — Z800 Family history of malignant neoplasm of digestive organs: Secondary | ICD-10-CM | POA: Diagnosis not present

## 2018-05-18 DIAGNOSIS — T451X5A Adverse effect of antineoplastic and immunosuppressive drugs, initial encounter: Secondary | ICD-10-CM | POA: Diagnosis not present

## 2018-05-18 MED ORDER — GABAPENTIN 300 MG PO CAPS: 900.0000 mg | ORAL_CAPSULE | Freq: Three times a day (TID) | ORAL | 6 refills | Status: DC

## 2018-05-18 NOTE — Progress Notes (Signed)
[facility] [place], [place] [address] [phone]   New Patient Evaluation  Date of Service: 05/18/18 Patient Name: Danny Duke Patient MRN: [id number] Patient DOB: 28-Sep-1935 Provider: Ventura Sellers, MD  Identifying Statement:  Danny Duke is a 83 y.o. male with neuropathy who presents for initial consultation and evaluation regarding cancer associated neurologic deficits.    Referring Provider: Carita Pian, [address] [place], [place] [address]  Primary Cancer:  Oncologic History: Oncology History   Patient presented with an incidental finding during annual work up with CXR     Squamous cell carcinoma lung, right ([place])   06/16/2017 Imaging    CT Chest IMPRESSION: 1. Right middle lobe mass with spiculated margins. Mass measures 3 cm in greatest dimension, but is also contiguous with additional opacity extending to the anterior inferior aspect of the right middle lobe. Neoplasm is suspected. Tissue sampling is warranted. 2. Irregular nodular opacities in the right upper lobe extending to the apex. This may all be scarring. Cannot exclude any of the small nodular components as neoplastic disease. 3. Bilateral apical pleuroparenchymal scarring 4. Small noncalcified left lobe nodule measuring 4 mm. 5. Advanced emphysema. 6. Mild mediastinal and right hilar adenopathy. Multiple lymph nodes demonstrate calcifications consistent with changes from old, healed granulomatous disease.    07/26/2017 Procedure    CT Biopsy    08/05/2017 Initial Diagnosis    Squamous cell carcinoma lung, right ([place])    09/01/2017 Imaging    MRI Brain No visible intracranial metastatic disease    09/19/2017 Pathology Results    REASON FOR ADDENDUM, AMENDMENT OR CORRECTION: [id number]: TNM stage corrected to read pT3, pN0 previously reported as pT2b, pN0. 11/10/17 08:26:35 AM (gt) FINAL DIAGNOSIS Diagnosis 1.  Lymph node, biopsy, 4R - THERE IS NO EVIDENCE OF CARCINOMA IN 1 OF 1 LYMPH NODE (0/1). 2. Lymph node, biopsy, 7 - THERE IS NO EVIDENCE OF CARCINOMA IN 1 OF 1 LYMPH NODE (0/1). 3. Lymph node, biopsy, 9 - THERE IS NO EVIDENCE OF CARCINOMA IN 1 OF 1 LYMPH NODE (0/1). 4. Pleura, biopsy, right parietal - INFLAMED FIBROUS TISSUE. - THERE IS NO EVIDENCE OF MALIGNANCY. 5. Lymph node, biopsy, 7 #4 - THERE IS NO EVIDENCE OF CARCINOMA IN 1 OF 1 LYMPH NODE (0/1). 6. Pleura, biopsy, right upper lobe nodule - INFLAMED FIBROUS TISSUE AND LUNG PARENCHYMA. - THERE IS NO EVIDENCE OF MALIGNANCY. 7. Lymph node, biopsy, 4R #2 - THERE IS NO EVIDENCE OF CARCINOMA IN 1 OF 1 LYMPH NODE (0/1). 8. Lymph node, biopsy, 7 #2 - THERE IS NO EVIDENCE OF CARCINOMA IN 1 OF 1 LYMPH NODE (0/1). 9. Lymph node, biopsy, 2R - THERE IS NO EVIDENCE OF CARCINOMA IN 1 OF 1 LYMPH NODE (0/1). 10. Lung, wedge biopsy/resection, Final margin - BENIGN LUNG PARENCHYMA WITH SUBPLEURAL DENSE FIBROSIS AND DYSTROPHIC CALCIFICATIONS. - THERE IS NO EVIDENCE OF CARCINOMA IN 8 OF 8 LYMPH NODES (0/8). - THERE IS NO EVIDENCE OF MALIGNANCY. 11. Lung, resection (segmental or lobe), Right middle lobe - INVASIVE SQUAMOUS CELL CARCINOMA, WELL-DIFFERENTIATED, SPANNING 4.2 CM.    10/20/2017 Surgery    Mediastinoscopy, right middle lobectomy, VATS    11/04/2017 Imaging    PET IMPRESSION: 1. The right middle lobe mass and adjacent right middle lobe nodule are both highly hypermetabolic on today's examination. In addition, there is biapical scarring which is mildly hypermetabolic, and mildly hypermetabolic scattered lymph nodes in the mediastinum and hila  which are partially calcified. Although malignancy is clearly a concern in the right middle lobe, part or all of the appearance could also be caused by active granulomatous disease. The possibility of active granulomatous disease is somewhat emphasized in this case given that the biapical scarring  and calcified lymph nodes would be more characteristic of a prior granulomatous process. That said, the right middle lobe lesions likely warrant sampling as these lesions are substantially more metabolic than the rest of the findings. 2. Small right pleural effusion with trace pleural thickening and low-grade activity.    11/24/2017 - 01/25/2018 Chemotherapy    The patient had palonosetron (ALOXI) injection 0.25 mg, 0.25 mg, Intravenous,  Once, 3 of 4 cycles Administration: 0.25 mg (11/24/2017), 0.25 mg (12/14/2017), 0.25 mg (01/05/2018) pegfilgrastim-cbqv (UDENYCA) injection 6 mg, 6 mg, Subcutaneous, Once, 3 of 4 cycles Administration: 6 mg (11/26/2017), 6 mg (12/16/2017), 6 mg (01/07/2018) CARBOplatin (PARAPLATIN) 400 mg in sodium chloride 0.9 % 250 mL chemo infusion, 400 mg (100 % of original dose 395.4 mg), Intravenous,  Once, 3 of 4 cycles Dose modification: 395.4 mg (original dose 395.4 mg, Cycle 1), 395.4 mg (original dose 395.4 mg, Cycle 2), 332 mg (original dose 332 mg, Cycle 3) Administration: 400 mg (11/24/2017), 400 mg (12/14/2017), 330 mg (01/05/2018) PACLitaxel (TAXOL) 366 mg in sodium chloride 0.9 % 500 mL chemo infusion (> 80mg /m2), 200 mg/m2 = 366 mg, Intravenous,  Once, 3 of 4 cycles Dose modification: 175 mg/m2 (original dose 200 mg/m2, Cycle 3, Reason: Provider Judgment) Administration: 366 mg (11/24/2017), 366 mg (12/14/2017), 324 mg (01/05/2018) fosaprepitant (EMEND) 150 mg, dexamethasone (DECADRON) 12 mg in sodium chloride 0.9 % 145 mL IVPB, , Intravenous,  Once, 3 of 4 cycles Administration:  (11/24/2017),  (12/14/2017),  (01/05/2018)  for chemotherapy treatment.      History of Present Illness: The patient's records from the referring physician were obtained and reviewed and the patient interviewed to confirm this HPI.  Danny Duke presents today to discuss neuropathic symptoms.  He describes sharp pain, pins and needles affecting both feet, especially in the toes.   Symptoms came on and were exacerbated following each chemotherapy treatment, which started in September and continued into January of this year.  When treatment was discontinued symptoms alleviated modestly, but over the past month pain in feet has recurred.  He was started on Gabapentin and uptitrated to 600mg  three times per day, which has helped somewhat.  No issues with strength or balance/gait.  Medications: Current Outpatient Medications on File Prior to Visit  Medication Sig Dispense Refill  . amLODipine (NORVASC) 10 MG tablet Take 10 mg by mouth daily.    Marland Kitchen aspirin EC 81 MG tablet Take 81 mg by mouth daily.    . chlorproMAZINE (THORAZINE) 25 MG tablet Take 1 tablet (25 mg total) by mouth 3 (three) times daily as needed. 30 tablet 0  . gabapentin (NEURONTIN) 300 MG capsule Take 2 capsules (600 mg total) by mouth 3 (three) times daily. 90 capsule 0  . lisinopril (PRINIVIL,ZESTRIL) 40 MG tablet Take 40 mg by mouth daily.  1  . metoprolol succinate (TOPROL-XL) 25 MG 24 hr tablet Take 1 tablet (25 mg total) by mouth daily. 60 tablet 1  . Oxycodone HCl 20 MG TABS Take 1 tablet by mouth every 6 (six) hours as needed.  0  . prochlorperazine (COMPAZINE) 10 MG tablet Take 1 tablet (10 mg total) by mouth every 6 (six) hours as needed for nausea or vomiting. 30 tablet 0  .  simvastatin (ZOCOR) 20 MG tablet Take 20 mg by mouth daily.    . tadalafil (CIALIS) 5 MG tablet Take 5 mg by mouth daily.  12   No current facility-administered medications on file prior to visit.     Allergies: No Known Allergies Past Medical History:  Past Medical History:  Diagnosis Date  . Cancer [facility])    possible lung cancer  . Chronic back pain   . Hypercholesterolemia   . Hypertension    Past Surgical History:  Past Surgical History:  Procedure Laterality Date  . BRONCHIAL NEEDLE ASPIRATION BIOPSY  08/05/2017   Procedure: BRONCHIAL NEEDLE ASPIRATION BIOPSIES;  Surgeon: Juanito Doom, MD;  Location: [facility]  ENDOSCOPY;  Service: Cardiopulmonary;;  . ENDOBRONCHIAL ULTRASOUND Bilateral 08/05/2017   Procedure: ENDOBRONCHIAL ULTRASOUND;  Surgeon: Juanito Doom, MD;  Location: [facility] ENDOSCOPY;  Service: Cardiopulmonary;  Laterality: Bilateral;  . IR IMAGING GUIDED PORT INSERTION  12/27/2017  . MEDIASTINOSCOPY N/A 09/19/2017   Procedure: MEDIASTINOSCOPY;  Surgeon: [name], MD;  Location: [facility] OR;  Service: Thoracic;  Laterality: N/A;  . MULTIPLE TOOTH EXTRACTIONS    . VIDEO ASSISTED THORACOSCOPY (VATS)/ LOBECTOMY Right 09/19/2017   Procedure: RIGHT VIDEO ASSISTED THORACOSCOPY (VATS)/ LOBECTOMY;  Surgeon: [name], MD;  Location: [facility] OR;  Service: Thoracic;  Laterality: Right;   Social History:  Social History   Socioeconomic History  . Marital status: Married    Spouse name: Not on file  . Number of children: Not on file  . Years of education: Not on file  . Highest education level: Not on file  Occupational History  . Not on file  Social Needs  . Financial resource strain: Not on file  . Food insecurity:    Worry: Not on file    Inability: Not on file  . Transportation needs:    Medical: Not on file    Non-medical: Not on file  Tobacco Use  . Smoking status: Former Smoker    Packs/day: 0.50    Years: 40.00    Pack years: 20.00    Last attempt to quit: 03/08/1997    Years since quitting: 21.2  . Smokeless tobacco: Never Used  Substance and Sexual Activity  . Alcohol use: No  . Drug use: No  . Sexual activity: Not on file  Lifestyle  . Physical activity:    Days per week: Not on file    Minutes per session: Not on file  . Stress: Not on file  Relationships  . Social connections:    Talks on phone: Not on file    Gets together: Not on file    Attends religious service: Not on file    Active member of club or organization: Not on file    Attends meetings of clubs or organizations: Not on file    Relationship status: Not on file  . Intimate partner  violence:    Fear of current or ex partner: Not on file    Emotionally abused: Not on file    Physically abused: Not on file    Forced sexual activity: Not on file  Other Topics Concern  . Not on file  Social History Narrative  . Not on file   Family History:  Family History  Problem Relation Age of Onset  . Colon cancer Brother   . Stomach cancer Brother     Review of Systems: Constitutional: Denies fevers, chills or abnormal weight loss Eyes: Denies blurriness of vision Ears, nose, mouth, throat, and  face: Denies mucositis or sore throat Respiratory: Denies cough, dyspnea or wheezes Cardiovascular: Denies palpitation, chest discomfort or lower extremity swelling Gastrointestinal:  Denies nausea, constipation, diarrhea GU: Denies dysuria or incontinence Skin: Denies abnormal skin rashes Neurological: Per HPI Musculoskeletal: Denies joint pain, back or neck discomfort. No decrease in ROM Behavioral/Psych: Denies anxiety, disturbance in thought content, and mood instability   Physical Exam: Vitals:   05/18/18 1008  BP: 126/75  Pulse: 61  Resp: 17  Temp: 98.4 F (36.9 C)  SpO2: 100%   KPS: 80. General: Alert, cooperative, pleasant, in no acute distress Head: Normal EENT: No conjunctival injection or scleral icterus. Oral mucosa moist Lungs: Resp effort normal Cardiac: Regular rate and rhythm Abdomen: Soft, non-distended abdomen Skin: No rashes cyanosis or petechiae. Extremities: No clubbing or edema  Neurologic Exam: Mental Status: Awake, alert, attentive to examiner. Oriented to self and environment. Language is fluent with intact comprehension.  Cranial Nerves: Visual acuity is grossly normal. Visual fields are full. Extra-ocular movements intact. No ptosis. Face is symmetric, tongue midline. Motor: Tone and bulk are normal. Power is full in both arms and legs. Reflexes normal in arms, 1+ at patellas, absent ankle jerks, no pathologic reflexes present. Intact  finger to nose bilaterally Sensory: Decreased to temperature to lower mid-shins bilaterally Gait: Normal and tandem gait is normal.   Labs: I have reviewed the data as listed    Component Value Date/Time   NA 140 02/24/2018 1013   K 4.0 02/24/2018 1013   CL 109 02/24/2018 1013   CO2 24 02/24/2018 1013   GLUCOSE 98 02/24/2018 1013   BUN 11 02/24/2018 1013   CREATININE 1.11 02/24/2018 1013   CALCIUM 9.7 02/24/2018 1013   PROT 7.5 02/24/2018 1013   ALBUMIN 3.4 (L) 02/24/2018 1013   AST 17 02/24/2018 1013   ALT 7 02/24/2018 1013   ALKPHOS 75 02/24/2018 1013   BILITOT 0.5 02/24/2018 1013   GFRNONAA >60 02/24/2018 1013   GFRAA >60 02/24/2018 1013   Lab Results  Component Value Date   WBC 5.7 02/24/2018   NEUTROABS 3.4 02/24/2018   HGB 11.4 (L) 02/24/2018   HCT 36.0 (L) 02/24/2018   MCV 99.4 02/24/2018   PLT 107 (L) 02/24/2018    Assessment/Plan 1. 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  Etiology is exposure to platinum based chemotherapy, possibly combined with remote history of alcoholism as a "second hit".  He is not diabetic.  He has tolerated gabapentin well, and we would recommend continuing up-titration until therapeutic peak or dose limiting side effects.  Some neuropathic pain patients require doses as high as 3600mg  daily for efficacy.  We recommended increasing Gabapentin to 900mg  TID.  If pain not managed after dose adjustment, we can re-evaluate and consider adding an adjunctive agent such as SNRI or TCA.  He is not currently on an antidepressant.  We spent twenty additional minutes teaching regarding the natural history, biology, and historical experience in the treatment of neurologic complications of cancer. We also provided teaching sheets for the patient to take home as an additional resource.  We appreciate the opportunity to participate in the care of Danny Duke.   All questions were answered. The patient knows to call the clinic with any problems, questions or concerns. No barriers to learning were detected.  The total time spent in the encounter was 40 minutes and more than 50% was on counseling and review of test  results   Ventura Sellers, MD Medical Director of Neuro-Oncology [facility] at [place] 05/18/18 12:14 PM

## 2018-05-31 ENCOUNTER — Telehealth: Payer: Self-pay | Admitting: *Deleted

## 2018-05-31 NOTE — Telephone Encounter (Signed)
Mrs Brister called to report that the increased dose of gabapentin is not helping the pain.

## 2018-06-01 ENCOUNTER — Telehealth: Payer: Self-pay | Admitting: Internal Medicine

## 2018-06-01 NOTE — Telephone Encounter (Signed)
Faxed medical records to [facility]. Release [place]#[phone]

## 2018-06-06 ENCOUNTER — Other Ambulatory Visit: Payer: Self-pay

## 2018-06-06 ENCOUNTER — Inpatient Hospital Stay (HOSPITAL_BASED_OUTPATIENT_CLINIC_OR_DEPARTMENT_OTHER): Payer: Medicare Other | Admitting: Internal Medicine

## 2018-06-06 ENCOUNTER — Telehealth: Payer: Self-pay | Admitting: Internal Medicine

## 2018-06-06 ENCOUNTER — Encounter: Payer: Self-pay | Admitting: Internal Medicine

## 2018-06-06 VITALS — BP 124/74 | HR 90 | Temp 98.6°F | Resp 18 | Ht 68.0 in | Wt 159.1 lb

## 2018-06-06 DIAGNOSIS — J439 Emphysema, unspecified: Secondary | ICD-10-CM | POA: Diagnosis not present

## 2018-06-06 DIAGNOSIS — C342 Malignant neoplasm of middle lobe, bronchus or lung: Secondary | ICD-10-CM

## 2018-06-06 DIAGNOSIS — M549 Dorsalgia, unspecified: Secondary | ICD-10-CM

## 2018-06-06 DIAGNOSIS — E7800 Pure hypercholesterolemia, unspecified: Secondary | ICD-10-CM

## 2018-06-06 DIAGNOSIS — Z7982 Long term (current) use of aspirin: Secondary | ICD-10-CM

## 2018-06-06 DIAGNOSIS — Z87891 Personal history of nicotine dependence: Secondary | ICD-10-CM

## 2018-06-06 DIAGNOSIS — G8929 Other chronic pain: Secondary | ICD-10-CM

## 2018-06-06 DIAGNOSIS — T451X5A Adverse effect of antineoplastic and immunosuppressive drugs, initial encounter: Secondary | ICD-10-CM | POA: Diagnosis not present

## 2018-06-06 DIAGNOSIS — Z79899 Other long term (current) drug therapy: Secondary | ICD-10-CM

## 2018-06-06 DIAGNOSIS — G620 Drug-induced polyneuropathy: Secondary | ICD-10-CM | POA: Diagnosis not present

## 2018-06-06 DIAGNOSIS — Z9221 Personal history of antineoplastic chemotherapy: Secondary | ICD-10-CM

## 2018-06-06 DIAGNOSIS — I10 Essential (primary) hypertension: Secondary | ICD-10-CM

## 2018-06-06 DIAGNOSIS — Z800 Family history of malignant neoplasm of digestive organs: Secondary | ICD-10-CM

## 2018-06-06 MED ORDER — DULOXETINE HCL 60 MG PO CPEP: 60.0000 mg | ORAL_CAPSULE | Freq: Every day | ORAL | 3 refills | Status: DC

## 2018-06-06 NOTE — Progress Notes (Signed)
[facility] [place], [place] [address] [phone]   Interval Evaluation  Date of Service: 06/06/18 Patient Name: Danny Duke Patient MRN: [id number] Patient DOB: [birth year] Provider: Ventura Sellers, MD  Identifying Statement:  Danny Duke is a 83 y.o. male with Chemotherapy-induced neuropathy ([facility])  Primary Cancer:  Oncologic History: Oncology History   Patient presented with an incidental finding during annual work up with CXR     Squamous cell carcinoma lung, right ([place])   06/16/2017 Imaging    CT Chest IMPRESSION: 1. Right middle lobe mass with spiculated margins. Mass measures 3 cm in greatest dimension, but is also contiguous with additional opacity extending to the anterior inferior aspect of the right middle lobe. Neoplasm is suspected. Tissue sampling is warranted. 2. Irregular nodular opacities in the right upper lobe extending to the apex. This may all be scarring. Cannot exclude any of the small nodular components as neoplastic disease. 3. Bilateral apical pleuroparenchymal scarring 4. Small noncalcified left lobe nodule measuring 4 mm. 5. Advanced emphysema. 6. Mild mediastinal and right hilar adenopathy. Multiple lymph nodes demonstrate calcifications consistent with changes from old, healed granulomatous disease.    07/26/2017 Procedure    CT Biopsy    08/05/2017 Initial Diagnosis    Squamous cell carcinoma lung, right ([place])    09/01/2017 Imaging    MRI Brain No visible intracranial metastatic disease    09/19/2017 Pathology Results    REASON FOR ADDENDUM, AMENDMENT OR CORRECTION: [id number]: TNM stage corrected to read pT3, pN0 previously reported as pT2b, pN0. 11/10/17 08:26:35 AM (gt) FINAL DIAGNOSIS Diagnosis 1. Lymph node, biopsy, 4R - THERE IS NO EVIDENCE OF CARCINOMA IN 1 OF 1 LYMPH NODE (0/1). 2. Lymph node, biopsy, 7 - THERE IS NO EVIDENCE OF CARCINOMA IN 1 OF 1 LYMPH  NODE (0/1). 3. Lymph node, biopsy, 9 - THERE IS NO EVIDENCE OF CARCINOMA IN 1 OF 1 LYMPH NODE (0/1). 4. Pleura, biopsy, right parietal - INFLAMED FIBROUS TISSUE. - THERE IS NO EVIDENCE OF MALIGNANCY. 5. Lymph node, biopsy, 7 #4 - THERE IS NO EVIDENCE OF CARCINOMA IN 1 OF 1 LYMPH NODE (0/1). 6. Pleura, biopsy, right upper lobe nodule - INFLAMED FIBROUS TISSUE AND LUNG PARENCHYMA. - THERE IS NO EVIDENCE OF MALIGNANCY. 7. Lymph node, biopsy, 4R #2 - THERE IS NO EVIDENCE OF CARCINOMA IN 1 OF 1 LYMPH NODE (0/1). 8. Lymph node, biopsy, 7 #2 - THERE IS NO EVIDENCE OF CARCINOMA IN 1 OF 1 LYMPH NODE (0/1). 9. Lymph node, biopsy, 2R - THERE IS NO EVIDENCE OF CARCINOMA IN 1 OF 1 LYMPH NODE (0/1). 10. Lung, wedge biopsy/resection, Final margin - BENIGN LUNG PARENCHYMA WITH SUBPLEURAL DENSE FIBROSIS AND DYSTROPHIC CALCIFICATIONS. - THERE IS NO EVIDENCE OF CARCINOMA IN 8 OF 8 LYMPH NODES (0/8). - THERE IS NO EVIDENCE OF MALIGNANCY. 11. Lung, resection (segmental or lobe), Right middle lobe - INVASIVE SQUAMOUS CELL CARCINOMA, WELL-DIFFERENTIATED, SPANNING 4.2 CM.    10/20/2017 Surgery    Mediastinoscopy, right middle lobectomy, VATS    11/04/2017 Imaging    PET IMPRESSION: 1. The right middle lobe mass and adjacent right middle lobe nodule are both highly hypermetabolic on today's examination. In addition, there is biapical scarring which is mildly hypermetabolic, and mildly hypermetabolic scattered lymph nodes in the mediastinum and hila which are partially calcified. Although malignancy is clearly a concern in the right middle lobe, part or all of the appearance could also be caused  by active granulomatous disease. The possibility of active granulomatous disease is somewhat emphasized in this case given that the biapical scarring and calcified lymph nodes would be more characteristic of a prior granulomatous process. That said, the right middle lobe lesions likely warrant sampling as these  lesions are substantially more metabolic than the rest of the findings. 2. Small right pleural effusion with trace pleural thickening and low-grade activity.    11/24/2017 - 01/25/2018 Chemotherapy    The patient had palonosetron (ALOXI) injection 0.25 mg, 0.25 mg, Intravenous,  Once, 3 of 4 cycles Administration: 0.25 mg (11/24/2017), 0.25 mg (12/14/2017), 0.25 mg (01/05/2018) pegfilgrastim-cbqv (UDENYCA) injection 6 mg, 6 mg, Subcutaneous, Once, 3 of 4 cycles Administration: 6 mg (11/26/2017), 6 mg (12/16/2017), 6 mg (01/07/2018) CARBOplatin (PARAPLATIN) 400 mg in sodium chloride 0.9 % 250 mL chemo infusion, 400 mg (100 % of original dose 395.4 mg), Intravenous,  Once, 3 of 4 cycles Dose modification: 395.4 mg (original dose 395.4 mg, Cycle 1), 395.4 mg (original dose 395.4 mg, Cycle 2), 332 mg (original dose 332 mg, Cycle 3) Administration: 400 mg (11/24/2017), 400 mg (12/14/2017), 330 mg (01/05/2018) PACLitaxel (TAXOL) 366 mg in sodium chloride 0.9 % 500 mL chemo infusion (> 80mg /m2), 200 mg/m2 = 366 mg, Intravenous,  Once, 3 of 4 cycles Dose modification: 175 mg/m2 (original dose 200 mg/m2, Cycle 3, Reason: Provider Judgment) Administration: 366 mg (11/24/2017), 366 mg (12/14/2017), 324 mg (01/05/2018) fosaprepitant (EMEND) 150 mg, dexamethasone (DECADRON) 12 mg in sodium chloride 0.9 % 145 mL IVPB, , Intravenous,  Once, 3 of 4 cycles Administration:  (11/24/2017),  (12/14/2017),  (01/05/2018)  for chemotherapy treatment.      Interval History:  Danny Duke presents today for neuropathy follow up.  He feels almost no improvement in symptoms since increasing gabapentin to 900mg  TID.  He continues to have painful burning/tingling in the bottoms of his feet.  Numbness is otherwise stable and still present.    H+P (05/15/18): Patient presents today to discuss neuropathic symptoms.  He describes sharp pain, pins and needles affecting both feet, especially in the toes.  Symptoms came on and were  exacerbated following each chemotherapy treatment, which started in September and continued into January of this year.  When treatment was discontinued symptoms alleviated modestly, but over the past month pain in feet has recurred.  He was started on Gabapentin and uptitrated to 600mg  three times per day, which has helped somewhat.  No issues with strength or balance/gait.  Medications: Current Outpatient Medications on File Prior to Visit  Medication Sig Dispense Refill  . amLODipine (NORVASC) 10 MG tablet Take 10 mg by mouth daily.    Marland Kitchen aspirin EC 81 MG tablet Take 81 mg by mouth daily.    . chlorproMAZINE (THORAZINE) 25 MG tablet Take 1 tablet (25 mg total) by mouth 3 (three) times daily as needed. 30 tablet 0  . gabapentin (NEURONTIN) 300 MG capsule Take 3 capsules (900 mg total) by mouth 3 (three) times daily. 120 capsule 6  . lisinopril (PRINIVIL,ZESTRIL) 40 MG tablet Take 40 mg by mouth daily.  1  . metoprolol succinate (TOPROL-XL) 25 MG 24 hr tablet Take 1 tablet (25 mg total) by mouth daily. 60 tablet 1  . Oxycodone HCl 20 MG TABS Take 1 tablet by mouth every 6 (six) hours as needed.  0  . prochlorperazine (COMPAZINE) 10 MG tablet Take 1 tablet (10 mg total) by mouth every 6 (six) hours as needed for nausea or vomiting. 30 tablet  0  . simvastatin (ZOCOR) 20 MG tablet Take 20 mg by mouth daily.    . tadalafil (CIALIS) 5 MG tablet Take 5 mg by mouth daily.  12   No current facility-administered medications on file prior to visit.     Allergies: No Known Allergies Past Medical History:  Past Medical History:  Diagnosis Date  . Cancer [facility])    possible lung cancer  . Chronic back pain   . Hypercholesterolemia   . Hypertension    Past Surgical History:  Past Surgical History:  Procedure Laterality Date  . BRONCHIAL NEEDLE ASPIRATION BIOPSY  08/05/2017   Procedure: BRONCHIAL NEEDLE ASPIRATION BIOPSIES;  Surgeon: Juanito Doom, MD;  Location: [facility];  Service:  Cardiopulmonary;;  . ENDOBRONCHIAL ULTRASOUND Bilateral 08/05/2017   Procedure: ENDOBRONCHIAL ULTRASOUND;  Surgeon: Juanito Doom, MD;  Location: [facility];  Service: Cardiopulmonary;  Laterality: Bilateral;  . IR IMAGING GUIDED PORT INSERTION  12/27/2017  . MEDIASTINOSCOPY N/A 09/19/2017   Procedure: MEDIASTINOSCOPY;  Surgeon: [name], MD;  Location: [facility] OR;  Service: Thoracic;  Laterality: N/A;  . MULTIPLE TOOTH EXTRACTIONS    . VIDEO ASSISTED THORACOSCOPY (VATS)/ LOBECTOMY Right 09/19/2017   Procedure: RIGHT VIDEO ASSISTED THORACOSCOPY (VATS)/ LOBECTOMY;  Surgeon: [name], MD;  Location: [facility] OR;  Service: Thoracic;  Laterality: Right;   Social History:  Social History   Socioeconomic History  . Marital status: Married    Spouse name: Not on file  . Number of children: Not on file  . Years of education: Not on file  . Highest education level: Not on file  Occupational History  . Not on file  Social Needs  . Financial resource strain: Not on file  . Food insecurity:    Worry: Not on file    Inability: Not on file  . Transportation needs:    Medical: Not on file    Non-medical: Not on file  Tobacco Use  . Smoking status: Former Smoker    Packs/day: 0.50    Years: 40.00    Pack years: 20.00    Last attempt to quit: 03/08/1997    Years since quitting: 21.2  . Smokeless tobacco: Never Used  Substance and Sexual Activity  . Alcohol use: No  . Drug use: No  . Sexual activity: Not on file  Lifestyle  . Physical activity:    Days per week: Not on file    Minutes per session: Not on file  . Stress: Not on file  Relationships  . Social connections:    Talks on phone: Not on file    Gets together: Not on file    Attends religious service: Not on file    Active member of club or organization: Not on file    Attends meetings of clubs or organizations: Not on file    Relationship status: Not on file  . Intimate partner violence:    Fear of  current or ex partner: Not on file    Emotionally abused: Not on file    Physically abused: Not on file    Forced sexual activity: Not on file  Other Topics Concern  . Not on file  Social History Narrative  . Not on file   Family History:  Family History  Problem Relation Age of Onset  . Colon cancer Brother   . Stomach cancer Brother     Review of Systems: Constitutional: Denies fevers, chills or abnormal weight loss Eyes: Denies blurriness of vision Ears, nose,  mouth, throat, and face: Denies mucositis or sore throat Respiratory: Denies cough, dyspnea or wheezes Cardiovascular: Denies palpitation, chest discomfort or lower extremity swelling Gastrointestinal:  Denies nausea, constipation, diarrhea GU: Denies dysuria or incontinence Skin: Denies abnormal skin rashes Neurological: Per HPI Musculoskeletal: Denies joint pain, back or neck discomfort. No decrease in ROM Behavioral/Psych: Denies anxiety, disturbance in thought content, and mood instability   Physical Exam: Vitals:   06/06/18 1044  BP: 124/74  Pulse: 90  Resp: 18  Temp: 98.6 F (37 C)  SpO2: 100%   KPS: 80. General: Alert, cooperative, pleasant, in no acute distress Head: Normal EENT: No conjunctival injection or scleral icterus. Oral mucosa moist Lungs: Resp effort normal Cardiac: Regular rate and rhythm Abdomen: Soft, non-distended abdomen Skin: No rashes cyanosis or petechiae. Extremities: No clubbing or edema  Neurologic Exam: Mental Status: Awake, alert, attentive to examiner. Oriented to self and environment. Language is fluent with intact comprehension.  Cranial Nerves: Visual acuity is grossly normal. Visual fields are full. Extra-ocular movements intact. No ptosis. Face is symmetric, tongue midline. Motor: Tone and bulk are normal. Power is full in both arms and legs. Reflexes normal in arms, 1+ at patellas, absent ankle jerks, no pathologic reflexes present. Intact finger to nose bilaterally  Sensory: Decreased to temperature to lower mid-shins bilaterally Gait: Normal and tandem gait is normal.   Labs: I have reviewed the data as listed    Component Value Date/Time   NA 140 02/24/2018 1013   K 4.0 02/24/2018 1013   CL 109 02/24/2018 1013   CO2 24 02/24/2018 1013   GLUCOSE 98 02/24/2018 1013   BUN 11 02/24/2018 1013   CREATININE 1.11 02/24/2018 1013   CALCIUM 9.7 02/24/2018 1013   PROT 7.5 02/24/2018 1013   ALBUMIN 3.4 (L) 02/24/2018 1013   AST 17 02/24/2018 1013   ALT 7 02/24/2018 1013   ALKPHOS 75 02/24/2018 1013   BILITOT 0.5 02/24/2018 1013   GFRNONAA >60 02/24/2018 1013   GFRAA >60 02/24/2018 1013   Lab Results  Component Value Date   WBC 5.7 02/24/2018   NEUTROABS 3.4 02/24/2018   HGB 11.4 (L) 02/24/2018   HCT 36.0 (L) 02/24/2018   MCV 99.4 02/24/2018   PLT 107 (L) 02/24/2018    Assessment/Plan 1. Chemotherapy-induced neuropathy [facility])  Danny Duke presents with symmetric length dependent polyneuropathy affecting primarily small fibers.  Etiology is exposure to platinum based chemotherapy, possibly combined with remote history of alcoholism as a "second hit".  He is not diabetic.  We recommended adding a SNRI to his neuropathic pain regimen.  We discussed and ordered cymbalta 60mg  daily.     He should continue Gabapentin to 900mg  TID in the meantime.    If symptoms are still not controlled we could consider transitioning from gabapentin to lyrica.   We appreciate the opportunity to participate in the care of Danny Duke.  He should return to clinic in 2 weeks following his next visit with Dr. Julien Nordmann.  All questions were answered. The patient knows to call the clinic with any problems, questions or concerns. No barriers to learning were detected.  The total time spent in the encounter was 25 minutes and more than 50% was on counseling and review of test results   Ventura Sellers, MD Medical Director of Neuro-Oncology [facility] at [place] 06/06/18 10:35 AM

## 2018-06-06 NOTE — Telephone Encounter (Signed)
Scheduled appt per 3/31 los.  Left a detailed message about scheduled appt.

## 2018-06-16 ENCOUNTER — Inpatient Hospital Stay: Payer: Medicare Other | Attending: Internal Medicine

## 2018-06-16 ENCOUNTER — Encounter (HOSPITAL_COMMUNITY): Payer: Self-pay

## 2018-06-16 ENCOUNTER — Telehealth: Payer: Self-pay | Admitting: Internal Medicine

## 2018-06-16 ENCOUNTER — Ambulatory Visit (HOSPITAL_COMMUNITY)
Admission: RE | Admit: 2018-06-16 | Discharge: 2018-06-16 | Disposition: A | Discharge status: Home / Self Care | Payer: Medicare Other | Source: Ambulatory Visit | Attending: Internal Medicine | Admitting: Internal Medicine

## 2018-06-16 ENCOUNTER — Other Ambulatory Visit: Payer: Self-pay

## 2018-06-16 DIAGNOSIS — C3490 Malignant neoplasm of unspecified part of unspecified bronchus or lung: Secondary | ICD-10-CM

## 2018-06-16 DIAGNOSIS — C342 Malignant neoplasm of middle lobe, bronchus or lung: Secondary | ICD-10-CM | POA: Diagnosis present

## 2018-06-16 LAB — CBC WITH DIFFERENTIAL (CANCER CENTER ONLY)
Abs Immature Granulocytes: 0.01 10*3/uL (ref 0.00–0.07)
Basophils Absolute: 0 10*3/uL (ref 0.0–0.1)
Basophils Relative: 0 %
Eosinophils Absolute: 0.1 10*3/uL (ref 0.0–0.5)
Eosinophils Relative: 1 %
HCT: 41.2 % (ref 39.0–52.0)
Hemoglobin: 13.4 g/dL (ref 13.0–17.0)
Immature Granulocytes: 0 %
Lymphocytes Relative: 31 %
Lymphs Abs: 1.6 10*3/uL (ref 0.7–4.0)
MCH: 30.4 pg (ref 26.0–34.0)
MCHC: 32.5 g/dL (ref 30.0–36.0)
MCV: 93.4 fL (ref 80.0–100.0)
Monocytes Absolute: 0.6 10*3/uL (ref 0.1–1.0)
Monocytes Relative: 12 %
Neutro Abs: 2.8 10*3/uL (ref 1.7–7.7)
Neutrophils Relative %: 56 %
Platelet Count: 128 10*3/uL — ABNORMAL LOW (ref 150–400)
RBC: 4.41 MIL/uL (ref 4.22–5.81)
RDW: 14.1 % (ref 11.5–15.5)
WBC Count: 5.1 10*3/uL (ref 4.0–10.5)
nRBC: 0 % (ref 0.0–0.2)

## 2018-06-16 LAB — CMP (CANCER CENTER ONLY)
ALT: 8 U/L (ref 0–44)
AST: 20 U/L (ref 15–41)
Albumin: 3.8 g/dL (ref 3.5–5.0)
Alkaline Phosphatase: 76 U/L (ref 38–126)
Anion gap: 10 (ref 5–15)
BUN: 15 mg/dL (ref 8–23)
CO2: 23 mmol/L (ref 22–32)
Calcium: 9.5 mg/dL (ref 8.9–10.3)
Chloride: 107 mmol/L (ref 98–111)
Creatinine: 1.46 mg/dL — ABNORMAL HIGH (ref 0.61–1.24)
GFR, Est AFR Am: 52 mL/min — ABNORMAL LOW (ref >60)
GFR, Estimated: 45 mL/min — ABNORMAL LOW (ref >60)
Glucose, Bld: 99 mg/dL (ref 70–99)
Potassium: 4.3 mmol/L (ref 3.5–5.1)
Sodium: 140 mmol/L (ref 135–145)
Total Bilirubin: 0.6 mg/dL (ref 0.3–1.2)
Total Protein: 8.1 g/dL (ref 6.5–8.1)

## 2018-06-16 MED ORDER — SODIUM CHLORIDE (PF) 0.9 % IJ SOLN
INTRAMUSCULAR | Status: AC
  Filled 2018-06-16: qty 50

## 2018-06-16 MED ORDER — IOHEXOL 300 MG/ML  SOLN
75.0000 mL | Freq: Once | INTRAMUSCULAR | Status: AC | PRN
  Administered 2018-06-16: 14:00:00 75 mL via INTRAVENOUS

## 2018-06-16 NOTE — Telephone Encounter (Signed)
Called patient per 4/10 sch message - left message for patient about 4/13 appt - no need to come in MD will give them a call

## 2018-06-19 ENCOUNTER — Telehealth: Payer: Self-pay | Admitting: Internal Medicine

## 2018-06-19 ENCOUNTER — Encounter: Payer: Self-pay | Admitting: Internal Medicine

## 2018-06-19 ENCOUNTER — Inpatient Hospital Stay (HOSPITAL_BASED_OUTPATIENT_CLINIC_OR_DEPARTMENT_OTHER): Payer: Medicare Other | Admitting: Internal Medicine

## 2018-06-19 DIAGNOSIS — G629 Polyneuropathy, unspecified: Secondary | ICD-10-CM

## 2018-06-19 DIAGNOSIS — Z79899 Other long term (current) drug therapy: Secondary | ICD-10-CM | POA: Diagnosis not present

## 2018-06-19 DIAGNOSIS — Z9221 Personal history of antineoplastic chemotherapy: Secondary | ICD-10-CM | POA: Diagnosis not present

## 2018-06-19 DIAGNOSIS — C3490 Malignant neoplasm of unspecified part of unspecified bronchus or lung: Secondary | ICD-10-CM | POA: Diagnosis not present

## 2018-06-19 DIAGNOSIS — G620 Drug-induced polyneuropathy: Secondary | ICD-10-CM

## 2018-06-19 MED ORDER — GABAPENTIN 300 MG PO CAPS: 900.0000 mg | ORAL_CAPSULE | Freq: Three times a day (TID) | ORAL | 6 refills | Status: DC

## 2018-06-19 NOTE — Progress Notes (Signed)
I connected with Danny Duke on 06/19/18 at 12:00 PM EDT by telephone visit and verified that I am speaking with the correct person using two identifiers.  I discussed the limitations, risks, security and privacy concerns of performing an evaluation and management service by telemedicine and the availability of in-person appointments. I also discussed with the patient that there may be a patient responsible charge related to this service. The patient expressed understanding and agreed to proceed.  Other persons participating in the visit and their role in the encounter:  Spouse  Patient's location:  Home  Provider's location:  Office  Chief Complaint:  Neuropathy  History of Present Ilness: Patient describes significant improvement in neuropathy symptoms since starting the cymbalta.  No ill effects. Observations: Cognition normal, language fluent Assessment and Plan: Continue current regimen, Cymbalta 60mg  daily and Gabapentin 900mg  TID. Follow Up Instructions: RTC as needed in person or via phone encounter  I discussed the assessment and treatment plan with the patient.  The patient was provided an opportunity to ask questions and all were answered.  The patient agreed with the plan and demonstrated understanding of the instructions.    The patient was advised to call back or seek an in-person evaluation if the symptoms worsen or if the condition fails to improve as anticipated.  I provided 5-10 minutes of non-face-to-face time during this enocunter.  Ventura Sellers, MD   I provided 10 minutes of non face-to-face telephone visit time during this encounter, and > 50% was spent counseling as documented under my assessment & plan.

## 2018-06-19 NOTE — Telephone Encounter (Signed)
No los per 4/13.

## 2018-06-19 NOTE — Progress Notes (Signed)
Virtual Visit via Telephone Note  I connected with Danny Duke on 06/19/18 at 11:15 AM EDT by telephone and verified that I am speaking with the correct person using two identifiers.   I discussed the limitations, risks, security and privacy concerns of performing an evaluation and management service by telephone and the availability of in person appointments. I also discussed with the patient that there may be a patient responsible charge related to this service. The patient expressed understanding and agreed to proceed.      [place] [phone]   [phone]  OFFICE PROGRESS NOTE  Danny Hy, PA-C [place]  [place] [address]  DIAGNOSIS: Stage IIB (T3, N0, M0) non-small cell lung cancer, invasive well-differentiated squamous cell carcinoma presented with right middle lobe lung mass.  PRIOR THERAPY:  1) Status post right VATS, right middle lobectomy with mediastinal lymph node sampling under the care of Dr. Roxan Hockey on 09/19/2017.  The tumor measured 4.2 cm but the carcinoma extends through the visceral pleura. 2) Adjuvant systemic chemotherapy with carboplatin for AUC of 6 and paclitaxel 200 mg/M2 every 3 weeks.  First dose 11/16/2017.  Status post 3 cycles.  Last dose was giving January 05, 2018 discontinued secondary to intolerance with significant peripheral neuropathy.  CURRENT THERAPY: Observation.  History of Present Illness: Danny Duke 83 y.o. male has a telephone follow-up visit with me today for evaluation and discussion of his lab and scan results.  The patient is feeling fine today with no concerning complaints.  He denied having any chest pain, shortness of cough or hemoptysis.  He has no nausea, vomiting, diarrhea or constipation.  His peripheral neuropathy has improved.  The patient denied having any recent weight loss or night sweats.  He had repeat CT scan of the chest performed recently and we are having  to visit for evaluation and discussion of his scan results.   MEDICAL HISTORY: Past Medical History:  Diagnosis Date   Cancer ([place])    possible lung cancer   Chronic back pain    Hypercholesterolemia    Hypertension     ALLERGIES:  has No Known Allergies.  MEDICATIONS:  Current Outpatient Medications  Medication Sig Dispense Refill   amLODipine (NORVASC) 10 MG tablet Take 10 mg by mouth daily.     aspirin EC 81 MG tablet Take 81 mg by mouth daily.     chlorproMAZINE (THORAZINE) 25 MG tablet Take 1 tablet (25 mg total) by mouth 3 (three) times daily as needed. 30 tablet 0   DULoxetine (CYMBALTA) 60 MG capsule Take 1 capsule (60 mg total) by mouth daily. 30 capsule 3   gabapentin (NEURONTIN) 300 MG capsule Take 3 capsules (900 mg total) by mouth 3 (three) times daily. 120 capsule 6   lisinopril (PRINIVIL,ZESTRIL) 40 MG tablet Take 40 mg by mouth daily.  1   metoprolol succinate (TOPROL-XL) 25 MG 24 hr tablet Take 1 tablet (25 mg total) by mouth daily. 60 tablet 1   Oxycodone HCl 20 MG TABS Take 1 tablet by mouth every 6 (six) hours as needed.  0   prochlorperazine (COMPAZINE) 10 MG tablet Take 1 tablet (10 mg total) by mouth every 6 (six) hours as needed for nausea or vomiting. 30 tablet 0   simvastatin (ZOCOR) 20 MG tablet Take 20 mg by mouth daily.     tadalafil (CIALIS) 5 MG tablet Take 5 mg by mouth daily.  12   No current facility-administered medications for  this visit.     SURGICAL HISTORY:  Past Surgical History:  Procedure Laterality Date   BRONCHIAL NEEDLE ASPIRATION BIOPSY  08/05/2017   Procedure: BRONCHIAL NEEDLE ASPIRATION BIOPSIES;  Surgeon: Juanito Doom, MD;  Location: [facility];  Service: Cardiopulmonary;;   ENDOBRONCHIAL ULTRASOUND Bilateral 08/05/2017   Procedure: ENDOBRONCHIAL ULTRASOUND;  Surgeon: Juanito Doom, MD;  Location: [facility];  Service: Cardiopulmonary;  Laterality: Bilateral;   IR IMAGING GUIDED PORT INSERTION   12/27/2017   MEDIASTINOSCOPY N/A 09/19/2017   Procedure: MEDIASTINOSCOPY;  Surgeon: [name], MD;  Location: [facility];  Service: Thoracic;  Laterality: N/A;   MULTIPLE TOOTH EXTRACTIONS     [place] (VATS)/ LOBECTOMY Right 09/19/2017   Procedure: RIGHT VIDEO ASSISTED THORACOSCOPY (VATS)/ LOBECTOMY;  Surgeon: [name], MD;  Location: [facility];  Service: Thoracic;  Laterality: Right;    REVIEW OF SYSTEMS:  A comprehensive review of systems was negative.   LABORATORY DATA: Lab Results  Component Value Date   WBC 5.1 06/16/2018   HGB 13.4 06/16/2018   HCT 41.2 06/16/2018   MCV 93.4 06/16/2018   PLT 128 (L) 06/16/2018      Chemistry      Component Value Date/Time   NA 140 06/16/2018 1249   K 4.3 06/16/2018 1249   CL 107 06/16/2018 1249   CO2 23 06/16/2018 1249   BUN 15 06/16/2018 1249   CREATININE 1.46 (H) 06/16/2018 1249      Component Value Date/Time   CALCIUM 9.5 06/16/2018 1249   ALKPHOS 76 06/16/2018 1249   AST 20 06/16/2018 1249   ALT 8 06/16/2018 1249   BILITOT 0.6 06/16/2018 1249       RADIOGRAPHIC STUDIES: Ct Chest W Contrast  Result Date: 06/18/2018 CLINICAL DATA:  Lung cancer diagnosed in 2019 with right lung surgery. Chemotherapy completed. No current chest complaints. EXAM: CT CHEST WITH CONTRAST TECHNIQUE: Multidetector CT imaging of the chest was performed during intravenous contrast administration. CONTRAST:  43mL OMNIPAQUE IOHEXOL 300 MG/ML  SOLN COMPARISON:  Chest CT 06/16/2017 and 02/24/2018.  PET-CT 07/05/2017. FINDINGS: Cardiovascular: Right IJ Port-A-Cath extends to the superior cavoatrial junction. No acute vascular findings are demonstrated. There is mild aortic and coronary artery atherosclerosis. The heart size is normal. There is no pericardial effusion. Mediastinum/Nodes: Residual mediastinal and right hilar adenopathy has not significantly changed from the most recent study. There is a right paratracheal node  measuring 1.6 cm short axis on image 44/2. There is a partially calcified right hilar mass measuring 2.8 x 1.9 cm on image 61/2. 1.5 cm subcarinal node on image 70/2 and left paraesophageal nodule measuring 13 mm on image 77/2 are stable. There is no axillary adenopathy. Stable small hiatal hernia. Lungs/Pleura: There is mild residual pleural thickening on the right, but no significant pleural effusion. Previous right middle lobectomy. There is fairly extensive centrilobular and paraseptal emphysema with biapical scarring and architectural distortion more inferiorly in the right upper lobe. There is a large bulla at the right lung base. 4 mm peripheral left upper lobe nodule on image 68/7 is stable. No new or enlarging nodules. Upper abdomen: The visualized upper abdomen appears stable without suspicious findings. There are stable low-density hepatic and renal lesions which are likely cysts. There is stable chronic dilatation of the main pancreatic duct without surrounding inflammation. Musculoskeletal/Chest wall: There is no chest wall mass or suspicious osseous finding. Stable thoracic kyphosis. IMPRESSION: 1. Stable postoperative appearance of the chest status post right middle lobectomy. No evidence  of local recurrence or progressive metastatic disease. 2. Multiple mildly enlarged mediastinal and right hilar lymph nodes are stable. 3. Stable chronic lung disease with paraseptal and centrilobular emphysema. No suspicious pulmonary nodules. 4. Stable incidental findings in the upper abdomen. Electronically Signed   By: Richardean Sale M.D.   On: 06/18/2018 19:48    ASSESSMENT AND PLAN: This is a very pleasant 83 years old African-American male recently diagnosed with stage IIB (T3, N0, M0) invasive well-differentiated squamous cell carcinoma presented with right middle lobe lung mass status post right middle lobectomy with lymph node sampling on September 19, 2017 under the care of Dr. Roxan Hockey. The patient  underwent adjuvant treatment with systemic chemotherapy with carboplatin for AUC of 6 and paclitaxel 200 mg/M2 every 3 weeks with Neulasta support status post 3 cycles. He tolerated this treatment well except for the chemotherapy-induced peripheral neuropathy and he discontinued his treatment after cycle #3. The patient is currently on observation and he is feeling fine with no concerning complaints. He had repeat CT scan of the chest performed recently.  I personally and independently reviewed the scans and discussed the results with the patient today. His scan showed no concerning findings for disease recurrence or progression. I recommended for the patient to continue on observation with repeat CT scan of the chest in 6 months. He was advised to call immediately if he has any concerning symptoms in the interval. The patient voices understanding of current disease status and treatment options and is in agreement with the current care plan.  Disclaimer: This note was dictated with voice recognition software. Similar sounding words can inadvertently be transcribed and may not be corrected upon review.  Follow Up Instructions: Lab and scan before next MD visit in 6 months.   I discussed the assessment and treatment plan with the patient. The patient was provided an opportunity to ask questions and all were answered. The patient agreed with the plan and demonstrated an understanding of the instructions.   The patient was advised to call back or seek an in-person evaluation if the symptoms worsen or if the condition fails to improve as anticipated.  I provided 12 minutes of non-face-to-face time during this encounter.   Eilleen Kempf, MD

## 2018-06-20 ENCOUNTER — Telehealth: Payer: Self-pay | Admitting: Internal Medicine

## 2018-06-20 NOTE — Telephone Encounter (Signed)
Tried to reach °

## 2018-06-23 ENCOUNTER — Telehealth: Payer: Self-pay | Admitting: Internal Medicine

## 2018-06-23 ENCOUNTER — Telehealth: Payer: Self-pay | Admitting: *Deleted

## 2018-06-23 NOTE — Telephone Encounter (Signed)
Received call from pt's wife. She states her husband would like his port removed as he is done with treatments. Informed wife that elective procedures are currently on hold d/t Covid 19 crisis, that we will have to wait until that restriction is lifted in order to schedule port removal. Danny Duke voiced understanding.   Patient has not had his port flushed since December 2019. Will send scheduling message for port flush for the upcoming week.  Wife voiced understanding

## 2018-06-23 NOTE — Telephone Encounter (Signed)
Scheduled appt per 4/17 sch message - unable to reach patient - left message with appt date and time

## 2018-06-28 ENCOUNTER — Inpatient Hospital Stay: Payer: Medicare Other

## 2018-07-04 ENCOUNTER — Telehealth: Payer: Self-pay | Admitting: Internal Medicine

## 2018-07-04 NOTE — Telephone Encounter (Signed)
Added flush appointments per 4/24 schedule message. Spoke with wife re add ons and next appointment for 5/1. Patient will get updated schedule 5/1.

## 2018-07-06 ENCOUNTER — Telehealth: Payer: Self-pay | Admitting: Medical Oncology

## 2018-07-06 NOTE — Telephone Encounter (Signed)
I told Danny Duke that pt will be screened for covid when he comes tomorrow with questions and get his temp taken.

## 2018-07-07 ENCOUNTER — Inpatient Hospital Stay: Payer: Medicare Other | Attending: Internal Medicine

## 2018-07-07 ENCOUNTER — Other Ambulatory Visit: Payer: Self-pay

## 2018-07-07 DIAGNOSIS — Z452 Encounter for adjustment and management of vascular access device: Secondary | ICD-10-CM | POA: Diagnosis not present

## 2018-07-07 DIAGNOSIS — C3491 Malignant neoplasm of unspecified part of right bronchus or lung: Secondary | ICD-10-CM

## 2018-07-07 DIAGNOSIS — C342 Malignant neoplasm of middle lobe, bronchus or lung: Secondary | ICD-10-CM | POA: Insufficient documentation

## 2018-07-07 MED ORDER — HEPARIN SOD (PORK) LOCK FLUSH 100 UNIT/ML IV SOLN
500.0000 [IU] | Freq: Once | INTRAVENOUS | Status: AC | PRN
  Administered 2018-07-07: 500 [IU]
  Filled 2018-07-07: qty 5

## 2018-07-07 MED ORDER — SODIUM CHLORIDE 0.9% FLUSH
10.0000 mL | INTRAVENOUS | Status: DC | PRN
  Administered 2018-07-07: 10 mL
  Filled 2018-07-07: qty 10

## 2018-07-07 NOTE — Patient Instructions (Signed)

## 2018-08-18 ENCOUNTER — Other Ambulatory Visit: Payer: Self-pay | Admitting: Medical Oncology

## 2018-08-18 ENCOUNTER — Telehealth: Payer: Self-pay | Admitting: Medical Oncology

## 2018-08-18 ENCOUNTER — Other Ambulatory Visit: Payer: Self-pay

## 2018-08-18 ENCOUNTER — Inpatient Hospital Stay: Payer: Medicare Other | Attending: Internal Medicine

## 2018-08-18 DIAGNOSIS — C3491 Malignant neoplasm of unspecified part of right bronchus or lung: Secondary | ICD-10-CM

## 2018-08-18 DIAGNOSIS — Z452 Encounter for adjustment and management of vascular access device: Secondary | ICD-10-CM | POA: Diagnosis not present

## 2018-08-18 DIAGNOSIS — Z95828 Presence of other vascular implants and grafts: Secondary | ICD-10-CM

## 2018-08-18 DIAGNOSIS — C342 Malignant neoplasm of middle lobe, bronchus or lung: Secondary | ICD-10-CM | POA: Diagnosis present

## 2018-08-18 MED ORDER — SODIUM CHLORIDE 0.9% FLUSH
10.0000 mL | INTRAVENOUS | Status: DC | PRN
  Administered 2018-08-18: 10 mL
  Filled 2018-08-18: qty 10

## 2018-08-18 MED ORDER — HEPARIN SOD (PORK) LOCK FLUSH 100 UNIT/ML IV SOLN
500.0000 [IU] | Freq: Once | INTRAVENOUS | Status: AC | PRN
  Administered 2018-08-18: 500 [IU]
  Filled 2018-08-18: qty 5

## 2018-08-18 MED ORDER — LIDOCAINE-PRILOCAINE 2.5-2.5 % EX CREA: 1.0000 "application " | TOPICAL_CREAM | CUTANEOUS | 0 refills | Status: DC | PRN

## 2018-08-18 NOTE — Telephone Encounter (Signed)
Refill request for emla cream and wants port d/c'd. Emla refilled.  Message sent to  mohamed.

## 2018-09-05 ENCOUNTER — Telehealth: Payer: Self-pay | Admitting: Internal Medicine

## 2018-09-05 ENCOUNTER — Telehealth: Payer: Self-pay | Admitting: *Deleted

## 2018-09-05 NOTE — Telephone Encounter (Signed)
Message sent to scheduler to schedule a phone visit.  Pt notified

## 2018-09-05 NOTE — Telephone Encounter (Signed)
Mrs Devincenzi states that Mr Micucci is taking cymbalta along with gabapentin to help his feet. Tried it for a month or so- reports that his feet are not any better. Is wondering if there is anything else he can take. Currently taking gabapentin 300 mg tablets- 3 tabs 3 times per day.

## 2018-09-05 NOTE — Telephone Encounter (Signed)
Scheduled appt per 6/30 sch message - pt wife is aware of appt date and time

## 2018-09-11 ENCOUNTER — Telehealth: Payer: Self-pay | Admitting: Internal Medicine

## 2018-09-12 ENCOUNTER — Inpatient Hospital Stay: Payer: Medicare Other | Attending: Internal Medicine | Admitting: Internal Medicine

## 2018-09-12 DIAGNOSIS — Z79899 Other long term (current) drug therapy: Secondary | ICD-10-CM | POA: Insufficient documentation

## 2018-09-12 DIAGNOSIS — G629 Polyneuropathy, unspecified: Secondary | ICD-10-CM | POA: Insufficient documentation

## 2018-09-12 DIAGNOSIS — G620 Drug-induced polyneuropathy: Secondary | ICD-10-CM

## 2018-09-12 DIAGNOSIS — Z452 Encounter for adjustment and management of vascular access device: Secondary | ICD-10-CM | POA: Insufficient documentation

## 2018-09-12 DIAGNOSIS — C342 Malignant neoplasm of middle lobe, bronchus or lung: Secondary | ICD-10-CM | POA: Insufficient documentation

## 2018-09-12 DIAGNOSIS — Z9221 Personal history of antineoplastic chemotherapy: Secondary | ICD-10-CM | POA: Insufficient documentation

## 2018-09-12 MED ORDER — PREGABALIN 150 MG PO CAPS: 150.0000 mg | ORAL_CAPSULE | Freq: Two times a day (BID) | ORAL | 1 refills | Status: DC

## 2018-09-12 MED ORDER — PREGABALIN 75 MG PO CAPS: 75.0000 mg | ORAL_CAPSULE | Freq: Two times a day (BID) | ORAL | 0 refills | Status: DC

## 2018-09-12 NOTE — Progress Notes (Signed)
I connected with Danny Duke on 09/12/18 at 12:30 PM EDT by telephone visit and verified that I am speaking with the correct person using two identifiers.  I discussed the limitations, risks, security and privacy concerns of performing an evaluation and management service by telemedicine and the availability of in-person appointments. I also discussed with the patient that there may be a patient responsible charge related to this service. The patient expressed understanding and agreed to proceed.  Other persons participating in the visit and their role in the encounter:  Spouse  Patient's location:  Home  Provider's location:  Office  Chief Complaint:  Neuropathy  History of Present Ilness: Patient describes recent decline in neuropathy symptoms despite full compliance with gabapentin and cymbalta.  Otherwise no new or progressive issues. Observations: Cognition normal, language fluent Assessment and Plan: Con't Cymbalta 60mg  daily and transition from Gabapentin 900mg  TID to Lyrica as follows: Start Lyrica 75mg  BID x7 days, then increase to 150mg  BID and discontinue gabapentin Follow Up Instructions: RTC as needed in person or via phone encounter  I discussed the assessment and treatment plan with the patient.  The patient was provided an opportunity to ask questions and all were answered.  The patient agreed with the plan and demonstrated understanding of the instructions.    The patient was advised to call back or seek an in-person evaluation if the symptoms worsen or if the condition fails to improve as anticipated.  I provided 5-10 minutes of non-face-to-face time during this enocunter.  Ventura Sellers, MD   I provided 10 minutes of non face-to-face telephone visit time during this encounter, and > 50% was spent counseling as documented under my assessment & plan.

## 2018-09-13 ENCOUNTER — Telehealth: Payer: Self-pay | Admitting: Internal Medicine

## 2018-09-13 NOTE — Telephone Encounter (Signed)
No los per 7/7. °

## 2018-09-22 ENCOUNTER — Other Ambulatory Visit: Payer: Self-pay | Admitting: Internal Medicine

## 2018-09-25 ENCOUNTER — Telehealth: Payer: Self-pay | Admitting: *Deleted

## 2018-09-25 ENCOUNTER — Other Ambulatory Visit: Payer: Self-pay | Admitting: Internal Medicine

## 2018-09-25 DIAGNOSIS — G620 Drug-induced polyneuropathy: Secondary | ICD-10-CM

## 2018-09-25 MED ORDER — GABAPENTIN 300 MG PO CAPS: 900.0000 mg | ORAL_CAPSULE | Freq: Three times a day (TID) | ORAL | 6 refills | Status: DC

## 2018-09-25 NOTE — Telephone Encounter (Signed)
Patient called to report that he started the Lyrica and stopped the gabapentin and cymbalta and is reporting worsening symptoms with peripheral neuropathy, swelling of left food, pain, and soreness along with the worsening numbness and tingling.    Per Dr Mickeal Skinner common side effect of Lyrica is swelling and patient should discontinue the Lyrica due to swelling and its ineffectiveness and he should go back on Cymbalta and Gabapentin since it was somewhat helpful with the PN.    Spoke with patients wife to relay instructions.  No further questions at this time.  Refills sent to pharmacy.

## 2018-09-29 ENCOUNTER — Other Ambulatory Visit: Payer: Self-pay

## 2018-09-29 ENCOUNTER — Inpatient Hospital Stay: Payer: Medicare Other

## 2018-09-29 DIAGNOSIS — C3491 Malignant neoplasm of unspecified part of right bronchus or lung: Secondary | ICD-10-CM

## 2018-09-29 DIAGNOSIS — Z9221 Personal history of antineoplastic chemotherapy: Secondary | ICD-10-CM | POA: Diagnosis not present

## 2018-09-29 DIAGNOSIS — C342 Malignant neoplasm of middle lobe, bronchus or lung: Secondary | ICD-10-CM | POA: Diagnosis not present

## 2018-09-29 DIAGNOSIS — Z79899 Other long term (current) drug therapy: Secondary | ICD-10-CM | POA: Diagnosis not present

## 2018-09-29 DIAGNOSIS — G629 Polyneuropathy, unspecified: Secondary | ICD-10-CM | POA: Diagnosis not present

## 2018-09-29 DIAGNOSIS — Z452 Encounter for adjustment and management of vascular access device: Secondary | ICD-10-CM | POA: Diagnosis not present

## 2018-09-29 MED ORDER — HEPARIN SOD (PORK) LOCK FLUSH 100 UNIT/ML IV SOLN
500.0000 [IU] | Freq: Once | INTRAVENOUS | Status: AC | PRN
  Administered 2018-09-29: 500 [IU]
  Filled 2018-09-29: qty 5

## 2018-09-29 MED ORDER — SODIUM CHLORIDE 0.9% FLUSH
10.0000 mL | INTRAVENOUS | Status: DC | PRN
  Administered 2018-09-29: 10 mL
  Filled 2018-09-29: qty 10

## 2018-10-02 ENCOUNTER — Telehealth: Payer: Self-pay | Admitting: Medical Oncology

## 2018-10-02 ENCOUNTER — Other Ambulatory Visit: Payer: Self-pay | Admitting: Thoracic Surgery (Cardiothoracic Vascular Surgery)

## 2018-10-02 DIAGNOSIS — C3491 Malignant neoplasm of unspecified part of right bronchus or lung: Secondary | ICD-10-CM

## 2018-10-02 NOTE — Telephone Encounter (Signed)
Ok unless he wants to wait until the next scan.

## 2018-10-02 NOTE — Telephone Encounter (Signed)
Wants port a cath removed-please advise.

## 2018-10-02 NOTE — Telephone Encounter (Signed)
Wife and pt notified.and he will wait until after sept appt.

## 2018-10-03 ENCOUNTER — Ambulatory Visit: Payer: Medicare Other | Admitting: Thoracic Surgery (Cardiothoracic Vascular Surgery)

## 2018-10-03 ENCOUNTER — Other Ambulatory Visit: Payer: Self-pay

## 2018-10-03 ENCOUNTER — Encounter: Payer: Self-pay | Admitting: Thoracic Surgery (Cardiothoracic Vascular Surgery)

## 2018-10-03 ENCOUNTER — Ambulatory Visit
Admission: RE | Admit: 2018-10-03 | Discharge: 2018-10-03 | Disposition: A | Discharge status: Home / Self Care | Payer: Medicare Other | Source: Ambulatory Visit | Attending: Thoracic Surgery (Cardiothoracic Vascular Surgery) | Admitting: Thoracic Surgery (Cardiothoracic Vascular Surgery)

## 2018-10-03 VITALS — BP 118/74 | HR 68 | Temp 97.7°F | Resp 16 | Ht 68.0 in | Wt 164.0 lb

## 2018-10-03 DIAGNOSIS — C3491 Malignant neoplasm of unspecified part of right bronchus or lung: Secondary | ICD-10-CM

## 2018-10-03 DIAGNOSIS — Z902 Acquired absence of lung [part of]: Secondary | ICD-10-CM | POA: Diagnosis not present

## 2018-10-03 NOTE — Progress Notes (Signed)
[address]       [place] [address]             [phone]    HPI: Danny Duke returns for a scheduled one-year follow-up visit  Danny Duke is an 83 year old man with a remote history of tobacco abuse, emphysema, hypertension, hyperlipidemia, chronic back pain, arthritis, and squamous cell carcinoma of the lung.  He underwent a right middle lobectomy for a T2, N0 stage Ib squamous cell carcinoma of the lung in July 2019.  He had some atrial fibrillation postoperatively but converted to sinus rhythm with amiodarone.  He is now off that medication.  He underwent adjuvant chemotherapy with carboplatin and paclitaxel.  He developed significant peripheral neuropathy in his feet associated with that.  He feels well.  Other than his peripheral neuropathy, but that has been getting better recently as well.  He is not having any respiratory issues.  His appetite is good.  His weight is stable.  Past Medical History:  Diagnosis Date  . Cancer [facility])    possible lung cancer  . Chronic back pain   . Hypercholesterolemia   . Hypertension     Current Outpatient Medications  Medication Sig Dispense Refill  . amLODipine (NORVASC) 10 MG tablet Take 10 mg by mouth daily.    Marland Kitchen aspirin EC 81 MG tablet Take 81 mg by mouth daily.    . chlorproMAZINE (THORAZINE) 25 MG tablet Take 1 tablet (25 mg total) by mouth 3 (three) times daily as needed. 30 tablet 0  . DULoxetine (CYMBALTA) 60 MG capsule TAKE 1 CAPSULE(60 MG) BY MOUTH DAILY 30 capsule 3  . gabapentin (NEURONTIN) 300 MG capsule Take 3 capsules (900 mg total) by mouth 3 (three) times daily. 120 capsule 6  . lidocaine-prilocaine (EMLA) cream Apply 1 application topically as needed. 30 g 0  . lisinopril (PRINIVIL,ZESTRIL) 40 MG tablet Take 40 mg by mouth daily.  1  . metoprolol succinate (TOPROL-XL) 25 MG 24 hr tablet Take 1 tablet (25 mg total) by mouth daily. 60 tablet 1  . Oxycodone HCl 20 MG TABS Take 1 tablet by mouth  every 6 (six) hours as needed.  0  . prochlorperazine (COMPAZINE) 10 MG tablet Take 1 tablet (10 mg total) by mouth every 6 (six) hours as needed for nausea or vomiting. 30 tablet 0  . simvastatin (ZOCOR) 20 MG tablet Take 20 mg by mouth daily.    . tadalafil (CIALIS) 5 MG tablet Take 5 mg by mouth daily.  12   No current facility-administered medications for this visit.     Physical Exam BP 118/74 (BP Location: Right Arm, Patient Position: Sitting, Cuff Size: Normal)   Pulse 68   Temp 97.7 F (36.5 C) Comment: THERMAL  Resp 16   Ht 5\' 8"  (1.727 m)   Wt 164 lb (74.4 kg)   SpO2 98% Comment: RA  BMI 24.27 kg/m  83 year old man in no acute distress Wearing surgical mask No cervical supraclavicular adenopathy Cardiac regular rate and rhythm Lungs slightly diminished, but clear with equal breath sounds bilaterally  Diagnostic Tests: I personally reviewed the chest x-ray images.  Impression: Danny Duke is an 83 year old former smoker who underwent a thoracoscopic right middle lobectomy for a stage Ib (T2, N0) non-small cell carcinoma a year ago.  He had adjuvant chemotherapy with carboplatin and paclitaxel.  He is doing well at this point in time with no residual effects from his lobectomy.  He does  have some peripheral neuropathy related to chemotherapy.  He is interesting in having his port removed after his next CT in October.  I would be happy to do that for him if he wishes.  Tobacco abuse-remote.  No recent use.  Hypertension-blood pressure well controlled on current regimen.  Hyperlipidemia-on Zocor  Plan: Follow-up as planned with Dr. Julien Nordmann.  I will be happy to see Danny Duke back anytime in the future if I can be of any further assistance with his care  [name], MD [facility] [phone]

## 2018-11-10 ENCOUNTER — Other Ambulatory Visit: Payer: Self-pay

## 2018-11-10 ENCOUNTER — Inpatient Hospital Stay: Payer: Medicare Other | Attending: Internal Medicine

## 2018-11-10 DIAGNOSIS — C342 Malignant neoplasm of middle lobe, bronchus or lung: Secondary | ICD-10-CM | POA: Diagnosis not present

## 2018-11-10 DIAGNOSIS — Z452 Encounter for adjustment and management of vascular access device: Secondary | ICD-10-CM | POA: Insufficient documentation

## 2018-11-10 DIAGNOSIS — C3491 Malignant neoplasm of unspecified part of right bronchus or lung: Secondary | ICD-10-CM

## 2018-11-10 MED ORDER — HEPARIN SOD (PORK) LOCK FLUSH 100 UNIT/ML IV SOLN
500.0000 [IU] | Freq: Once | INTRAVENOUS | Status: AC | PRN
  Administered 2018-11-10: 500 [IU]
  Filled 2018-11-10: qty 5

## 2018-11-10 MED ORDER — SODIUM CHLORIDE 0.9% FLUSH
10.0000 mL | INTRAVENOUS | Status: DC | PRN
  Administered 2018-11-10: 12:00:00 10 mL
  Filled 2018-11-10: qty 10

## 2018-12-15 ENCOUNTER — Other Ambulatory Visit: Payer: Self-pay

## 2018-12-15 ENCOUNTER — Inpatient Hospital Stay: Payer: Medicare Other

## 2018-12-15 ENCOUNTER — Inpatient Hospital Stay: Payer: Medicare Other | Attending: Internal Medicine

## 2018-12-15 DIAGNOSIS — I10 Essential (primary) hypertension: Secondary | ICD-10-CM | POA: Diagnosis not present

## 2018-12-15 DIAGNOSIS — Z9221 Personal history of antineoplastic chemotherapy: Secondary | ICD-10-CM | POA: Diagnosis not present

## 2018-12-15 DIAGNOSIS — G629 Polyneuropathy, unspecified: Secondary | ICD-10-CM | POA: Insufficient documentation

## 2018-12-15 DIAGNOSIS — C3491 Malignant neoplasm of unspecified part of right bronchus or lung: Secondary | ICD-10-CM

## 2018-12-15 DIAGNOSIS — Z79899 Other long term (current) drug therapy: Secondary | ICD-10-CM | POA: Insufficient documentation

## 2018-12-15 DIAGNOSIS — M549 Dorsalgia, unspecified: Secondary | ICD-10-CM | POA: Insufficient documentation

## 2018-12-15 DIAGNOSIS — Z7982 Long term (current) use of aspirin: Secondary | ICD-10-CM | POA: Insufficient documentation

## 2018-12-15 DIAGNOSIS — E7800 Pure hypercholesterolemia, unspecified: Secondary | ICD-10-CM | POA: Diagnosis not present

## 2018-12-15 DIAGNOSIS — C342 Malignant neoplasm of middle lobe, bronchus or lung: Secondary | ICD-10-CM | POA: Insufficient documentation

## 2018-12-15 DIAGNOSIS — C3490 Malignant neoplasm of unspecified part of unspecified bronchus or lung: Secondary | ICD-10-CM

## 2018-12-15 LAB — CBC WITH DIFFERENTIAL (CANCER CENTER ONLY)
Abs Immature Granulocytes: 0.02 10*3/uL (ref 0.00–0.07)
Basophils Absolute: 0 10*3/uL (ref 0.0–0.1)
Basophils Relative: 0 %
Eosinophils Absolute: 0.2 10*3/uL (ref 0.0–0.5)
Eosinophils Relative: 5 %
HCT: 39.3 % (ref 39.0–52.0)
Hemoglobin: 12.8 g/dL — ABNORMAL LOW (ref 13.0–17.0)
Immature Granulocytes: 0 %
Lymphocytes Relative: 32 %
Lymphs Abs: 1.6 10*3/uL (ref 0.7–4.0)
MCH: 30.6 pg (ref 26.0–34.0)
MCHC: 32.6 g/dL (ref 30.0–36.0)
MCV: 94 fL (ref 80.0–100.0)
Monocytes Absolute: 0.7 10*3/uL (ref 0.1–1.0)
Monocytes Relative: 14 %
Neutro Abs: 2.6 10*3/uL (ref 1.7–7.7)
Neutrophils Relative %: 49 %
Platelet Count: 121 10*3/uL — ABNORMAL LOW (ref 150–400)
RBC: 4.18 MIL/uL — ABNORMAL LOW (ref 4.22–5.81)
RDW: 14 % (ref 11.5–15.5)
WBC Count: 5.2 10*3/uL (ref 4.0–10.5)
nRBC: 0 % (ref 0.0–0.2)

## 2018-12-15 LAB — CMP (CANCER CENTER ONLY)
ALT: 8 U/L (ref 0–44)
AST: 22 U/L (ref 15–41)
Albumin: 3.7 g/dL (ref 3.5–5.0)
Alkaline Phosphatase: 60 U/L (ref 38–126)
Anion gap: 7 (ref 5–15)
BUN: 17 mg/dL (ref 8–23)
CO2: 25 mmol/L (ref 22–32)
Calcium: 9.2 mg/dL (ref 8.9–10.3)
Chloride: 108 mmol/L (ref 98–111)
Creatinine: 1.33 mg/dL — ABNORMAL HIGH (ref 0.61–1.24)
GFR, Est AFR Am: 58 mL/min — ABNORMAL LOW (ref >60)
GFR, Estimated: 50 mL/min — ABNORMAL LOW (ref >60)
Glucose, Bld: 119 mg/dL — ABNORMAL HIGH (ref 70–99)
Potassium: 4.1 mmol/L (ref 3.5–5.1)
Sodium: 140 mmol/L (ref 135–145)
Total Bilirubin: 0.3 mg/dL (ref 0.3–1.2)
Total Protein: 7.6 g/dL (ref 6.5–8.1)

## 2018-12-15 MED ORDER — HEPARIN SOD (PORK) LOCK FLUSH 100 UNIT/ML IV SOLN
500.0000 [IU] | Freq: Once | INTRAVENOUS | Status: AC | PRN
  Administered 2018-12-15: 10:00:00 500 [IU]
  Filled 2018-12-15: qty 5

## 2018-12-15 MED ORDER — SODIUM CHLORIDE 0.9% FLUSH
10.0000 mL | INTRAVENOUS | Status: DC | PRN
  Administered 2018-12-15: 10:00:00 10 mL
  Filled 2018-12-15: qty 10

## 2018-12-19 ENCOUNTER — Ambulatory Visit (HOSPITAL_COMMUNITY)
Admission: RE | Admit: 2018-12-19 | Discharge: 2018-12-19 | Disposition: A | Discharge status: Home / Self Care | Payer: Medicare Other | Source: Ambulatory Visit | Attending: Internal Medicine | Admitting: Internal Medicine

## 2018-12-19 ENCOUNTER — Other Ambulatory Visit: Payer: Self-pay

## 2018-12-19 DIAGNOSIS — C3490 Malignant neoplasm of unspecified part of unspecified bronchus or lung: Secondary | ICD-10-CM | POA: Diagnosis not present

## 2018-12-19 MED ORDER — IOHEXOL 300 MG/ML  SOLN
75.0000 mL | Freq: Once | INTRAMUSCULAR | Status: AC | PRN
  Administered 2018-12-19: 75 mL via INTRAVENOUS

## 2018-12-19 MED ORDER — HEPARIN SOD (PORK) LOCK FLUSH 100 UNIT/ML IV SOLN
INTRAVENOUS | Status: AC
  Filled 2018-12-19: qty 5

## 2018-12-19 MED ORDER — SODIUM CHLORIDE (PF) 0.9 % IJ SOLN
INTRAMUSCULAR | Status: AC
  Filled 2018-12-19: qty 50

## 2018-12-19 MED ORDER — HEPARIN SOD (PORK) LOCK FLUSH 100 UNIT/ML IV SOLN
500.0000 [IU] | Freq: Once | INTRAVENOUS | Status: AC
  Administered 2018-12-19: 500 [IU] via INTRAVENOUS

## 2018-12-20 ENCOUNTER — Other Ambulatory Visit: Payer: Self-pay

## 2018-12-20 ENCOUNTER — Telehealth: Payer: Self-pay | Admitting: Internal Medicine

## 2018-12-20 ENCOUNTER — Inpatient Hospital Stay: Payer: Medicare Other | Admitting: Internal Medicine

## 2018-12-20 ENCOUNTER — Encounter: Payer: Self-pay | Admitting: Internal Medicine

## 2018-12-20 VITALS — BP 129/88 | HR 100 | Temp 98.5°F | Resp 17 | Ht 68.0 in | Wt 165.1 lb

## 2018-12-20 DIAGNOSIS — C3491 Malignant neoplasm of unspecified part of right bronchus or lung: Secondary | ICD-10-CM

## 2018-12-20 DIAGNOSIS — C3490 Malignant neoplasm of unspecified part of unspecified bronchus or lung: Secondary | ICD-10-CM

## 2018-12-20 DIAGNOSIS — C342 Malignant neoplasm of middle lobe, bronchus or lung: Secondary | ICD-10-CM | POA: Diagnosis not present

## 2018-12-20 NOTE — Progress Notes (Signed)
[facility] [phone]   [phone]  OFFICE PROGRESS NOTE  [name], PA-C [place]  [place] [address]  DIAGNOSIS: Stage IIB (T3, N0, M0) non-small cell lung cancer, invasive well-differentiated squamous cell carcinoma presented with right middle lobe lung mass.  PRIOR THERAPY:  1) Status post right VATS, right middle lobectomy with mediastinal lymph node sampling under the care of Dr. Roxan Hockey on 09/19/2017.  The tumor measured 4.2 cm but the carcinoma extends through the visceral pleura. 2) Adjuvant systemic chemotherapy with carboplatin for AUC of 6 and paclitaxel 200 mg/M2 every 3 weeks.  First dose 11/16/2017.  Status post 3 cycles.  Last dose was giving January 05, 2018 discontinued secondary to intolerance with significant peripheral neuropathy.  CURRENT THERAPY: Observation.  INTERVAL HISTORY: Danny Duke 83 y.o. male returns to the clinic today for follow-up visit.  The patient is feeling fine today with no concerning complaints.  The patient denied having any chest pain, shortness of breath, cough or hemoptysis.  He denied having any recent weight loss or night sweats.  He has no nausea, vomiting, diarrhea or constipation.  Denied having any headache or visual changes.  The patient had repeat CT scan of the chest performed recently and is here today for evaluation and discussion of his discuss results.   MEDICAL HISTORY: Past Medical History:  Diagnosis Date   Cancer ([place])    possible lung cancer   Chronic back pain    Hypercholesterolemia    Hypertension     ALLERGIES:  has No Known Allergies.  MEDICATIONS:  Current Outpatient Medications  Medication Sig Dispense Refill   amLODipine (NORVASC) 10 MG tablet Take 10 mg by mouth daily.     aspirin EC 81 MG tablet Take 81 mg by mouth daily.     chlorproMAZINE (THORAZINE) 25 MG tablet Take 1 tablet (25 mg total) by mouth 3 (three) times daily as  needed. 30 tablet 0   DULoxetine (CYMBALTA) 60 MG capsule TAKE 1 CAPSULE(60 MG) BY MOUTH DAILY 30 capsule 3   gabapentin (NEURONTIN) 300 MG capsule Take 3 capsules (900 mg total) by mouth 3 (three) times daily. 120 capsule 6   lidocaine-prilocaine (EMLA) cream Apply 1 application topically as needed. 30 g 0   lisinopril (PRINIVIL,ZESTRIL) 40 MG tablet Take 40 mg by mouth daily.  1   metoprolol succinate (TOPROL-XL) 25 MG 24 hr tablet Take 1 tablet (25 mg total) by mouth daily. 60 tablet 1   Oxycodone HCl 20 MG TABS Take 1 tablet by mouth every 6 (six) hours as needed.  0   prochlorperazine (COMPAZINE) 10 MG tablet Take 1 tablet (10 mg total) by mouth every 6 (six) hours as needed for nausea or vomiting. 30 tablet 0   simvastatin (ZOCOR) 20 MG tablet Take 20 mg by mouth daily.     tadalafil (CIALIS) 5 MG tablet Take 5 mg by mouth daily.  12   No current facility-administered medications for this visit.     SURGICAL HISTORY:  Past Surgical History:  Procedure Laterality Date   BRONCHIAL NEEDLE ASPIRATION BIOPSY  08/05/2017   Procedure: BRONCHIAL NEEDLE ASPIRATION BIOPSIES;  Surgeon: Juanito Doom, MD;  Location: [facility];  Service: Cardiopulmonary;;   ENDOBRONCHIAL ULTRASOUND Bilateral 08/05/2017   Procedure: ENDOBRONCHIAL ULTRASOUND;  Surgeon: Juanito Doom, MD;  Location: [facility];  Service: Cardiopulmonary;  Laterality: Bilateral;   IR IMAGING GUIDED PORT INSERTION  12/27/2017   MEDIASTINOSCOPY N/A 09/19/2017  Procedure: MEDIASTINOSCOPY;  Surgeon: [name], MD;  Location: [facility];  Service: Thoracic;  Laterality: N/A;   MULTIPLE TOOTH EXTRACTIONS     [place] (VATS)/ LOBECTOMY Right 09/19/2017   Procedure: RIGHT VIDEO ASSISTED THORACOSCOPY (VATS)/ LOBECTOMY;  Surgeon: [name], MD;  Location: [facility];  Service: Thoracic;  Laterality: Right;    REVIEW OF SYSTEMS:  A comprehensive review of systems was negative.    PHYSICAL EXAMINATION: General appearance: alert, cooperative and no distress Head: Normocephalic, without obvious abnormality, atraumatic Neck: no adenopathy, no JVD, supple, symmetrical, trachea midline and thyroid not enlarged, symmetric, no tenderness/mass/nodules Lymph nodes: Cervical, supraclavicular, and axillary nodes normal. Resp: clear to auscultation bilaterally Back: symmetric, no curvature. ROM normal. No CVA tenderness. Cardio: regular rate and rhythm, S1, S2 normal, no murmur, click, rub or gallop GI: soft, non-tender; bowel sounds normal; no masses,  no organomegaly Extremities: extremities normal, atraumatic, no cyanosis or edema  ECOG PERFORMANCE STATUS: 1 - Symptomatic but completely ambulatory  Blood pressure 129/88, pulse 100, temperature 98.5 F (36.9 C), temperature source Temporal, resp. rate 17, height 5\' 8"  (1.727 m), weight 165 lb 1.6 oz (74.9 kg), SpO2 100 %.  LABORATORY DATA: Lab Results  Component Value Date   WBC 5.2 12/15/2018   HGB 12.8 (L) 12/15/2018   HCT 39.3 12/15/2018   MCV 94.0 12/15/2018   PLT 121 (L) 12/15/2018      Chemistry      Component Value Date/Time   NA 140 12/15/2018 0957   K 4.1 12/15/2018 0957   CL 108 12/15/2018 0957   CO2 25 12/15/2018 0957   BUN 17 12/15/2018 0957   CREATININE 1.33 (H) 12/15/2018 0957      Component Value Date/Time   CALCIUM 9.2 12/15/2018 0957   ALKPHOS 60 12/15/2018 0957   AST 22 12/15/2018 0957   ALT 8 12/15/2018 0957   BILITOT 0.3 12/15/2018 0957       RADIOGRAPHIC STUDIES: Ct Chest W Contrast  Result Date: 12/19/2018 CLINICAL DATA:  Non-small-cell lung cancer.  Restaging. EXAM: CT CHEST WITH CONTRAST TECHNIQUE: Multidetector CT imaging of the chest was performed during intravenous contrast administration. CONTRAST:  63mL OMNIPAQUE IOHEXOL 300 MG/ML  SOLN COMPARISON:  06/16/2018 FINDINGS: Cardiovascular: The heart size is normal. No substantial pericardial effusion. Coronary artery  calcification is evident. Atherosclerotic calcification is noted in the wall of the thoracic aorta. Right Port-A-Cath tip is positioned at the SVC/RA junction. Mediastinum/Nodes: Index right paratracheal lymph node is stable at 1.6 cm short axis similar appearance of multiple calcified mediastinal and hilar lymph nodes. Calcified right hilar lesion measured previously at 2.8 x 1.9 cm is 2.5 x 2.0 cm today on image 66/2. Index subcarinal node measured previously at 1.5 cm is now 1.3 cm (75/2). Lungs/Pleura: Biapical pleuroparenchymal scarring noted, as before. Centrilobular and paraseptal emphysema evident. Areas of architectural distortion are noted in the lungs bilaterally with stable volume loss right hemithorax. No suspicious new pulmonary nodule or mass. No new areas of focal airspace consolidation. No pulmonary edema or pleural effusion. 4 mm left upper lobe nodule on 71/7 is stable. Perifissural nodules in the left lung (54/7) are stable. Upper Abdomen: Scattered tiny hypodensities in the liver parenchyma are stable. Bilateral renal cysts again noted. Main duct dilatation in the body of pancreas measures up to 5 mm diameter, stable. Musculoskeletal: No worrisome lytic or sclerotic osseous abnormality. IMPRESSION: 1. Stable exam. No new or progressive interval findings. Patient is status post right middle lobectomy without  evidence for recurrent or metastatic disease in the chest. 2. The mild mediastinal lymphadenopathy is stable in the patient has multiple calcified lymph nodes in the mediastinum and hilar regions bilaterally. 3. Emphysema with chronic interstitial lung disease. A few scattered left lung nodules are unchanged. 4.  Aortic Atherosclerois (ICD10-170.0) Electronically Signed   By: Misty Stanley M.D.   On: 12/19/2018 12:44    ASSESSMENT AND PLAN: This is a very pleasant 83 years old African-American male recently diagnosed with stage IIB (T3, N0, M0) invasive well-differentiated squamous cell  carcinoma presented with right middle lobe lung mass status post right middle lobectomy with lymph node sampling on September 19, 2017 under the care of Dr. Roxan Hockey. The patient underwent adjuvant treatment with systemic chemotherapy with carboplatin for AUC of 6 and paclitaxel 200 mg/M2 every 3 weeks with Neulasta support status post 3 cycles. He tolerated this treatment well except for the chemotherapy-induced peripheral neuropathy and he discontinued his treatment after cycle #3. The patient is currently on observation and he is feeling fine with no concerning complaints. He had repeat CT scan of the chest performed recently.  I personally and independently reviewed the scans and discussed the results with the patient today. His a scan showed no concerning findings for disease recurrence or progression. I recommended for him to continue on observation with repeat CT scan of the chest in 6 months. We will continue to have Port-A-Cath flush every 2 months and may consider removing it after the next visit if no evidence for disease recurrence or progression. The patient was advised to call immediately if he has any concerning symptoms in the interval. The patient voices understanding of current disease status and treatment options and is in agreement with the current care plan.  All questions were answered. The patient knows to call the clinic with any problems, questions or concerns. We can certainly see the patient much sooner if necessary.  I spent 10 minutes counseling the patient face to face. The total time spent in the appointment was 15 minutes.  Disclaimer: This note was dictated with voice recognition software. Similar sounding words can inadvertently be transcribed and may not be corrected upon review.

## 2018-12-20 NOTE — Telephone Encounter (Signed)
Scheduled appt per 10/14 los- mailed reminder letter with appt date and time

## 2019-01-30 ENCOUNTER — Telehealth: Payer: Self-pay | Admitting: Internal Medicine

## 2019-01-30 NOTE — Telephone Encounter (Signed)
FAXED RECORDS TO [facility] [id number]

## 2019-01-31 ENCOUNTER — Telehealth: Payer: Self-pay | Admitting: Physical Therapy

## 2019-01-31 NOTE — Telephone Encounter (Signed)
Attempted to contact patient regarding therapy order received to discuss options for in-person versus telehealth therapy given high risk for complications due to Covid-tried calling both home and mobile numbers but no answer-automated recording at both numbers that voicemail not set up.

## 2019-02-02 ENCOUNTER — Inpatient Hospital Stay: Payer: Medicare Other | Attending: Internal Medicine

## 2019-02-06 ENCOUNTER — Other Ambulatory Visit: Payer: Self-pay | Admitting: Internal Medicine

## 2019-02-15 ENCOUNTER — Encounter: Payer: Self-pay | Admitting: Physical Therapy

## 2019-02-15 ENCOUNTER — Ambulatory Visit: Payer: Medicare Other | Attending: Orthopedic Surgery | Admitting: Physical Therapy

## 2019-02-15 ENCOUNTER — Other Ambulatory Visit: Payer: Self-pay

## 2019-02-15 DIAGNOSIS — M25552 Pain in left hip: Secondary | ICD-10-CM | POA: Diagnosis present

## 2019-02-15 DIAGNOSIS — M79652 Pain in left thigh: Secondary | ICD-10-CM | POA: Insufficient documentation

## 2019-02-15 DIAGNOSIS — M6281 Muscle weakness (generalized): Secondary | ICD-10-CM | POA: Insufficient documentation

## 2019-02-15 NOTE — Therapy (Signed)
[place], [place], [address] Phone: [phone]   Fax:  [phone]  Physical Therapy Evaluation  Patient Details  Name: Danny Duke MRN: [id number] Date of Birth: 1935/11/14 Referring Provider (PT): Rod Can, MD   Encounter Date: 02/15/2019  PT End of Session - 02/15/19 0925    Visit Number  1    Number of Visits  12    Date for PT Re-Evaluation  03/29/19    Authorization Type  [employer] Medicare, progress note by visit 10    PT Start Time  0924    PT Stop Time  1013    PT Time Calculation (min)  49 min    Activity Tolerance  Patient tolerated treatment well    Behavior During Therapy  [facility] for tasks assessed/performed       Past Medical History:  Diagnosis Date  . Cancer [facility])    possible lung cancer  . Chronic back pain   . Hypercholesterolemia   . Hypertension     Past Surgical History:  Procedure Laterality Date  . BRONCHIAL NEEDLE ASPIRATION BIOPSY  08/05/2017   Procedure: BRONCHIAL NEEDLE ASPIRATION BIOPSIES;  Surgeon: Juanito Doom, MD;  Location: [facility];  Service: Cardiopulmonary;;  . ENDOBRONCHIAL ULTRASOUND Bilateral 08/05/2017   Procedure: ENDOBRONCHIAL ULTRASOUND;  Surgeon: Juanito Doom, MD;  Location: [facility];  Service: Cardiopulmonary;  Laterality: Bilateral;  . IR IMAGING GUIDED PORT INSERTION  12/27/2017  . MEDIASTINOSCOPY N/A 09/19/2017   Procedure: MEDIASTINOSCOPY;  Surgeon: [name], MD;  Location: [facility] OR;  Service: Thoracic;  Laterality: N/A;  . MULTIPLE TOOTH EXTRACTIONS    . VIDEO ASSISTED THORACOSCOPY (VATS)/ LOBECTOMY Right 09/19/2017   Procedure: RIGHT VIDEO ASSISTED THORACOSCOPY (VATS)/ LOBECTOMY;  Surgeon: [name], MD;  Location: [facility];  Service: Thoracic;  Laterality: Right;    There were no vitals filed for this visit.   Subjective Assessment - 02/15/19 0929    Subjective  Pt. with recent history lung CA (finished  chemo around 7/19) reports insidious onset of left lateral thigh pain about 2-3 months ago. He saw MD and was diagnosed with IT band syndrome. Pt. reports pain is worst with prolonged sitting and sit>stand but eased with subsequent walking. Primary pain is in left proximal lateral hip with intermittent radiating down left lateral thigh to knee.    Pertinent History  recent lung CA, chronic back pain s/p injections    Limitations  Standing;Walking;House hold activities;Sitting    Patient Stated Goals  Get rid of leg pain    Currently in Pain?  Yes    Pain Score  7     Pain Location  Leg    Pain Orientation  Left;Lateral;Proximal    Pain Descriptors / Indicators  Sharp    Pain Type  Chronic pain    Pain Radiating Towards  from left lateral thigh to knee    Pain Onset  More than a month ago    Pain Frequency  Intermittent    Aggravating Factors   prolonged sitting, sit>stand after prolonged sitting    Pain Relieving Factors  better with more walking after sitting, medication    Effect of Pain on Daily Activities  limits positional tolerance and ability ADLs         [facility] PT Assessment - 02/15/19 0001      Assessment   Medical Diagnosis  Left IT band syndrome    Referring Provider (PT)  Rod Can, MD  Onset Date/Surgical Date  11/16/18    Hand Dominance  Right    Prior Therapy  none      Precautions   Precaution Comments  lung cancer s/p chemo 2019      Restrictions   Weight Bearing Restrictions  No      Balance Screen   Has the patient fallen in the past 6 months  No      [place] residence    Living Arrangements  Spouse/significant other    Home Access  Level entry    Home Layout  One level      Prior Function   Level of Independence  Independent with community mobility without device      Cognition   Overall Cognitive Status  Within Functional Limits for tasks assessed      Observation/Other Assessments   Focus on  Therapeutic Outcomes (FOTO)   36% limited      ROM / Strength   AROM / PROM / Strength  AROM;PROM;Strength      AROM   Overall AROM Comments  no change in symptoms with lumbar ROM    AROM Assessment Site  Hip;Lumbar    Right/Left Hip  Right;Left    Right Hip Flexion  100    Right Hip External Rotation   35    Right Hip Internal Rotation   20    Right Hip ABduction  45    Right Hip ADduction  --   [facility]   Left Hip Flexion  95    Left Hip External Rotation   25    Left Hip Internal Rotation   12    Left Hip ABduction  45    Left Hip ADduction  --   [facility]   Lumbar Flexion  70    Lumbar Extension  10    Lumbar - Right Side Bend  17    Lumbar - Left Side Bend  22    Lumbar - Right Rotation  60%    Lumbar - Left Rotation  70%      PROM   Overall PROM Comments  tight left hip external rotators and IT band      Strength   Strength Assessment Site  Hip;Knee    Right/Left Hip  Right;Left    Right Hip Flexion  4+/5    Right Hip Extension  4+/5    Right Hip External Rotation   5/5    Right Hip Internal Rotation  5/5    Right Hip ABduction  4/5    Right Hip ADduction  5/5    Left Hip Flexion  4+/5    Left Hip Extension  4/5    Left Hip External Rotation  4+/5    Left Hip Internal Rotation  5/5    Left Hip ABduction  4-/5    Left Hip ADduction  4+/5    Right/Left Knee  Right;Left    Right Knee Flexion  5/5    Right Knee Extension  5/5    Left Knee Flexion  5/5    Left Knee Extension  5/5      Flexibility   Soft Tissue Assessment /Muscle Length  --   hamstring tightness with SLR 70 deg, IT band tightness     Palpation   Palpation comment  TTP left TFL and gluteus medius      Special Tests   Other special tests  (+) Ober's test on left  Objective measurements completed on examination: See above findings.      [facility] Adult PT Treatment/Exercise - 02/15/19 0001      Exercises   Exercises  Knee/Hip      Knee/Hip Exercises: Stretches   ITB  Stretch  Left;2 reps;30 seconds    Piriformis Stretch  Left;3 reps;30 seconds    Piriformis Stretch Limitations  incl. gluteus medius, lateral hip stretch      Manual Therapy   Manual Therapy  Soft tissue mobilization    Soft tissue mobilization  STM/IASTM including roller use left lateral hip-TFL and gluteus medius and IT band       Trigger Point Dry Needling - 02/15/19 0001    Consent Given?  Yes    Education Handout Provided  Yes    Muscles Treated Back/Hip  Gluteus medius;Tensor fascia lata    Dry Needling Comments  needled in right sidelying position with 32 gauge 60 mm needles           PT Education - 02/15/19 1021    Education Details  symptoms etiology, HEP, stretches, dry needling    Person(s) Educated  Patient    Methods  Explanation;Demonstration;Tactile cues;Verbal cues;Handout    Comprehension  Verbalized understanding;Returned demonstration          PT Long Term Goals - 02/15/19 1028      PT LONG TERM GOAL #1   Title  Independent with HEP    Baseline  needs HEP    Time  6    Period  Weeks    Status  New    Target Date  03/29/19      PT LONG TERM GOAL #2   Title  Improve FOTO outcome measure score to 29% or less impairment    Baseline  36% limited    Time  6    Period  Weeks    Status  New    Target Date  03/29/19      PT LONG TERM GOAL #3   Title  (-) repeat Ober's test for demonstration of decreased IT band pain to improve tolerance for transfers and sitting    Baseline  (+) Ober's    Time  6    Period  Weeks    Status  New    Target Date  03/29/19      PT LONG TERM GOAL #4   Title  Tolerate sitting for periods at least 30 min for eating meals, car travel, rest with left hip and thigh pain <3/10 at worst    Baseline  7/10 pain    Time  6    Period  Weeks    Status  New    Target Date  03/29/19      PT LONG TERM GOAL #5   Title  Increase left hip strength for abduction, ER and extension at least 1/2 MMT grade for improved gait  mechanics to decrease hip pain    Baseline  abduction 4-/5, extension 4/5, ER 4+/5    Time  6    Period  Weeks    Status  New    Target Date  03/29/19             Plan - 02/15/19 1022    Clinical Impression Statement  Pt. presents with left lateral hip and thigh pain consistent with referring diagnosis IT band syndrome with IT band and hip muscle tightness as well as lateral hip muscle weakness and myofascial trigger points in left gluteus medius  and TFL. Pt. has history chronic LBP but based on exam findings suspect symptoms local to hip and thigh rather than radicular in etiology. Pt. would benefit from PT to help relieve pain and address current associated functional limitations.    Personal Factors and Comorbidities  Comorbidity 2    Comorbidities  chronic LBP, cancer history    Examination-Activity Limitations  Stand;Locomotion Level;Sit    Stability/Clinical Decision Making  Stable/Uncomplicated    Clinical Decision Making  Low    Rehab Potential  Good    PT Frequency  --   1-2x/week   PT Duration  6 weeks    PT Treatment/Interventions  ADLs/Self Care Home Management;Iontophoresis 4mg /ml Dexamethasone;Moist Heat;Cryotherapy;Therapeutic exercise;Therapeutic activities;Gait training;Neuromuscular re-education;Functional mobility training;Patient/family education;Manual techniques;Dry needling;Taping    PT Next Visit Plan  no [place] or estim due to recent CA history, check response dry needling and continue as found beneficial, IT band and lateral hip/hip external rotator stretches, STM/IASTM left lateral hip and IT band, progress hip strengthening as tolerated pending pain    PT Home Exercise Plan  supine IT band and lateral hip/piriformis stretches, hip abduction SLR (standing)    Consulted and Agree with Plan of Care  Patient       Patient will benefit from skilled therapeutic intervention in order to improve the following deficits and impairments:  Pain, Impaired flexibility,  Decreased strength, Difficulty walking, Decreased range of motion  Visit Diagnosis: Pain in left hip  Pain in left thigh  Muscle weakness (generalized)     Problem List Patient Active Problem List   Diagnosis Date Noted  . Chemotherapy-induced neuropathy ([place]) 05/18/2018  . Goals of care, counseling/discussion 12/13/2017  . Encounter for antineoplastic chemotherapy 12/13/2017  . Status post surgery 09/19/2017  . S/P lobectomy of lung 09/19/2017  . Squamous cell carcinoma lung, right ([place])   . COPD GOLD I  07/13/2017  . Essential hypertension 07/13/2017  . Mass of middle lobe of right lung 07/12/2017    Beaulah Dinning, PT, DPT 02/15/19 10:33 AM  [facility] [address] [place], [place], [address] Phone: [phone]   Fax:  [phone]  Name: ZAKARIYAH FREIMARK MRN: [id number] Date of Birth: 05-09-1935

## 2019-02-15 NOTE — Patient Instructions (Signed)

## 2019-02-19 ENCOUNTER — Other Ambulatory Visit: Payer: Self-pay

## 2019-02-19 ENCOUNTER — Inpatient Hospital Stay: Payer: Medicare Other | Attending: Internal Medicine

## 2019-02-19 DIAGNOSIS — C342 Malignant neoplasm of middle lobe, bronchus or lung: Secondary | ICD-10-CM | POA: Insufficient documentation

## 2019-02-19 DIAGNOSIS — Z452 Encounter for adjustment and management of vascular access device: Secondary | ICD-10-CM | POA: Insufficient documentation

## 2019-02-19 DIAGNOSIS — C3491 Malignant neoplasm of unspecified part of right bronchus or lung: Secondary | ICD-10-CM

## 2019-02-19 MED ORDER — SODIUM CHLORIDE 0.9% FLUSH
10.0000 mL | INTRAVENOUS | Status: DC | PRN
  Administered 2019-02-19: 10 mL
  Filled 2019-02-19: qty 10

## 2019-02-19 MED ORDER — HEPARIN SOD (PORK) LOCK FLUSH 100 UNIT/ML IV SOLN
500.0000 [IU] | Freq: Once | INTRAVENOUS | Status: AC | PRN
  Administered 2019-02-19: 500 [IU]
  Filled 2019-02-19: qty 5

## 2019-02-22 ENCOUNTER — Other Ambulatory Visit: Payer: Self-pay

## 2019-02-22 ENCOUNTER — Ambulatory Visit: Payer: Medicare Other | Admitting: Physical Therapy

## 2019-02-22 DIAGNOSIS — M6281 Muscle weakness (generalized): Secondary | ICD-10-CM

## 2019-02-22 DIAGNOSIS — M25552 Pain in left hip: Secondary | ICD-10-CM | POA: Diagnosis not present

## 2019-02-22 DIAGNOSIS — M79652 Pain in left thigh: Secondary | ICD-10-CM

## 2019-02-23 NOTE — Therapy (Signed)
[place], [place], [address] Phone: [phone]   Fax:  [phone]  Physical Therapy Treatment  Patient Details  Name: Danny Duke MRN: [id number] Date of Birth: 06-14-1935 Referring Provider (PT): Rod Can, MD   Encounter Date: 02/22/2019  PT End of Session - 02/22/19 1155    Visit Number  2    Number of Visits  12    Date for PT Re-Evaluation  03/29/19    Authorization Type  [employer] Medicare, progress note by visit 10    PT Start Time  1148    PT Stop Time  1230    PT Time Calculation (min)  42 min    Activity Tolerance  Patient tolerated treatment well    Behavior During Therapy  [facility] for tasks assessed/performed       Past Medical History:  Diagnosis Date  . Cancer [facility])    possible lung cancer  . Chronic back pain   . Hypercholesterolemia   . Hypertension     Past Surgical History:  Procedure Laterality Date  . BRONCHIAL NEEDLE ASPIRATION BIOPSY  08/05/2017   Procedure: BRONCHIAL NEEDLE ASPIRATION BIOPSIES;  Surgeon: Juanito Doom, MD;  Location: [facility];  Service: Cardiopulmonary;;  . ENDOBRONCHIAL ULTRASOUND Bilateral 08/05/2017   Procedure: ENDOBRONCHIAL ULTRASOUND;  Surgeon: Juanito Doom, MD;  Location: [facility];  Service: Cardiopulmonary;  Laterality: Bilateral;  . IR IMAGING GUIDED PORT INSERTION  12/27/2017  . MEDIASTINOSCOPY N/A 09/19/2017   Procedure: MEDIASTINOSCOPY;  Surgeon: [name], MD;  Location: [facility] OR;  Service: Thoracic;  Laterality: N/A;  . MULTIPLE TOOTH EXTRACTIONS    . VIDEO ASSISTED THORACOSCOPY (VATS)/ LOBECTOMY Right 09/19/2017   Procedure: RIGHT VIDEO ASSISTED THORACOSCOPY (VATS)/ LOBECTOMY;  Surgeon: [name], MD;  Location: [place];  Service: Thoracic;  Laterality: Right;    There were no vitals filed for this visit.  Subjective Assessment - 02/22/19 1151    Subjective  Patient reports his hip has been feeling  better. He is having less pain. He is not longer having pain when he is stransfering from sit to stand. He reports about a day after his last session he stopped having pain.    Pertinent History  recent lung CA, chronic back pain s/p injections    Limitations  Standing;Walking;House hold activities;Sitting    Patient Stated Goals  Get rid of leg pain    Currently in Pain?  No/denies    Multiple Pain Sites  No                       [facility] Adult PT Treatment/Exercise - 02/23/19 0001      Knee/Hip Exercises: Stretches   ITB Stretch  Left;2 reps;30 seconds    Piriformis Stretch  Left;3 reps;30 seconds    Piriformis Stretch Limitations  incl. gluteus medius, lateral hip stretch      Knee/Hip Exercises: Aerobic   Nustep  5  min       Knee/Hip Exercises: Supine   Bridges Limitations  2x10    Other Supine Knee/Hip Exercises  supine march 2x10     Other Supine Knee/Hip Exercises  supine clam shell 2x10 red       Manual Therapy   Soft tissue mobilization  STM/ IASTM including roller use left lateral hip-TFL and gluteus medius and IT band             PT Education - 02/23/19 0840  Education Details  updated HEP    Person(s) Educated  Patient    Methods  Explanation;Demonstration;Tactile cues;Verbal cues    Comprehension  Verbalized understanding;Returned demonstration;Verbal cues required;Tactile cues required          PT Long Term Goals - 02/23/19 0840      PT LONG TERM GOAL #1   Title  Independent with HEP    Baseline  needs HEP    Time  6    Period  Weeks    Status  New      PT LONG TERM GOAL #2   Title  Improve FOTO outcome measure score to 29% or less impairment    Baseline  36% limited    Time  6    Period  Weeks    Status  On-going      PT LONG TERM GOAL #3   Title  (-) repeat Ober's test for demonstration of decreased IT band pain to improve tolerance for transfers and sitting    Time  6    Period  Weeks    Status  On-going      PT LONG  TERM GOAL #4   Title  Tolerate sitting for periods at least 30 min for eating meals, car travel, rest with left hip and thigh pain <3/10 at worst    Baseline  7/10 pain    Time  6    Period  Weeks    Status  On-going      PT LONG TERM GOAL #5   Title  Increase left hip strength for abduction, ER and extension at least 1/2 MMT grade for improved gait mechanics to decrease hip pain    Baseline  abduction 4-/5, extension 4/5, ER 4+/5    Time  6    Period  Weeks    Status  On-going            Plan - 02/22/19 1214    Clinical Impression Statement  Patient tolerated treatment well. no significant trigger points noted in his gluteals so no TPDN perfromed. Therapy did perfrom some manual stretching of the hip to improve mobility. He also perfromed ther-ex. The patient is doing well. We will add in standing exercises next visit.    Personal Factors and Comorbidities  Comorbidity 2    Comorbidities  chronic LBP, cancer history    Examination-Activity Limitations  Stand;Locomotion Level;Sit    Stability/Clinical Decision Making  Stable/Uncomplicated    Clinical Decision Making  Low    Rehab Potential  Good    PT Duration  6 weeks    PT Treatment/Interventions  ADLs/Self Care Home Management;Iontophoresis 4mg /ml Dexamethasone;Moist Heat;Cryotherapy;Therapeutic exercise;Therapeutic activities;Gait training;Neuromuscular re-education;Functional mobility training;Patient/family education;Manual techniques;Dry needling;Taping    PT Next Visit Plan  consider standing exercises, needling PRN.    PT Home Exercise Plan  supine IT band and lateral hip/piriformis stretches, hip abduction SLR (standing)    Consulted and Agree with Plan of Care  Patient       Patient will benefit from skilled therapeutic intervention in order to improve the following deficits and impairments:  Pain, Impaired flexibility, Decreased strength, Difficulty walking, Decreased range of motion  Visit Diagnosis: Pain in left  hip  Pain in left thigh  Muscle weakness (generalized)     Problem List Patient Active Problem List   Diagnosis Date Noted  . Chemotherapy-induced neuropathy ([place]) 05/18/2018  . Goals of care, counseling/discussion 12/13/2017  . Encounter for antineoplastic chemotherapy 12/13/2017  . Status post surgery 09/19/2017  .  S/P lobectomy of lung 09/19/2017  . Squamous cell carcinoma lung, right ([place])   . COPD GOLD I  07/13/2017  . Essential hypertension 07/13/2017  . Mass of middle lobe of right lung 07/12/2017    Carney Living PT DPT  02/23/2019, 8:41 AM  [place], [place], [address] Phone: [phone]   Fax:  [phone]  Name: Danny Duke MRN: [id number] Date of Birth: 1935/10/22

## 2019-02-27 ENCOUNTER — Ambulatory Visit: Payer: Medicare Other | Admitting: Physical Therapy

## 2019-03-06 ENCOUNTER — Other Ambulatory Visit: Payer: Self-pay

## 2019-03-06 ENCOUNTER — Ambulatory Visit: Payer: Medicare Other | Admitting: Physical Therapy

## 2019-03-06 ENCOUNTER — Encounter: Payer: Self-pay | Admitting: Physical Therapy

## 2019-03-06 DIAGNOSIS — M25552 Pain in left hip: Secondary | ICD-10-CM | POA: Diagnosis not present

## 2019-03-06 DIAGNOSIS — M6281 Muscle weakness (generalized): Secondary | ICD-10-CM

## 2019-03-06 DIAGNOSIS — M79652 Pain in left thigh: Secondary | ICD-10-CM

## 2019-03-06 NOTE — Patient Instructions (Addendum)
Always hold onto chair for safety and look up for good posture and alignment  ABDUCTION: Standing - Resistance Band (Active)   Stand, feet flat. Against yellow resistance band, lift right leg out to side. Complete _2__ sets of _10__ repetitions. Perform _1-2__ sessions per day.      Strengthening: Hip Extension - Resisted   With tubing around right ankle, face anchor and pull leg straight back. Repeat __10__ times per set. Do _2___ sets per session. Do __1-2__ sessions per day.  INTERNAL ROTATION: Sitting - Resistance Band (Active)   Sit, feet flat. Lift right leg slightly and, against yellow resistance band, move foot outward. Complete _2__ sets of _10__ repetitions. Perform 1-2___ sessions per day. Use a ball , squeeze knees and and kick heel up  EXTERNAL ROTATION: Sitting - Resistance Band (Active)   Sit, feet flat. Lift right knee and, against yellow resistance band, move foot inward toward opposite knee. Complete _2__ sets of _10__ repetitions. Perform _1-2__ sessions per day. Bring heel towards opposite knee   Copyright  VHI. All rights reserved.   Voncille Lo, PT Certified Exercise Expert for the Aging Adult  03/06/19 11:23 AM Phone: [phone] Fax: [phone]

## 2019-03-06 NOTE — Therapy (Signed)
[place], [place], [address] Phone: [phone]   Fax:  [phone]  Physical Therapy Treatment  Patient Details  Name: Danny Duke MRN: [id number] Date of Birth: 1935-10-24 Referring Provider (PT): Rod Can, MD   Encounter Date: 03/06/2019  PT End of Session - 03/06/19 1110    Visit Number  3    Number of Visits  12    Date for PT Re-Evaluation  03/29/19    Authorization Type  [employer]HC Medicare, progress note by visit 10    PT Start Time  1101    PT Stop Time  1155    PT Time Calculation (min)  54 min    Activity Tolerance  Patient tolerated treatment well    Behavior During Therapy  [facility] for tasks assessed/performed       Past Medical History:  Diagnosis Date  . Cancer [facility])    possible lung cancer  . Chronic back pain   . Hypercholesterolemia   . Hypertension     Past Surgical History:  Procedure Laterality Date  . BRONCHIAL NEEDLE ASPIRATION BIOPSY  08/05/2017   Procedure: BRONCHIAL NEEDLE ASPIRATION BIOPSIES;  Surgeon: Juanito Doom, MD;  [facility];  Service: Cardiopulmonary;;  . ENDOBRONCHIAL ULTRASOUND Bilateral 08/05/2017   Procedure: ENDOBRONCHIAL ULTRASOUND;  Surgeon: Juanito Doom, MD;  [facility];  Service: Cardiopulmonary;  Laterality: Bilateral;  . IR IMAGING GUIDED PORT INSERTION  12/27/2017  . MEDIASTINOSCOPY N/A 09/19/2017   Procedure: MEDIASTINOSCOPY;  Surgeon: [name], MD;  Location: [facility] OR;  Service: Thoracic;  Laterality: N/A;  . MULTIPLE TOOTH EXTRACTIONS    . VIDEO ASSISTED THORACOSCOPY (VATS)/ LOBECTOMY Right 09/19/2017   Procedure: RIGHT VIDEO ASSISTED THORACOSCOPY (VATS)/ LOBECTOMY;  Surgeon: [name], MD;  Location: [place];  Service: Thoracic;  Laterality: Right;    There were no vitals filed for this visit.  Subjective Assessment - 03/06/19 1106    Subjective  Pt reports his hips are feeling better.   Over the holidays and I did more walking and my pain came back a little. especially when I go up steps    Pertinent History  recent lung CA, chronic back pain s/p injections    Limitations  Standing;Walking;House hold activities;Sitting    Currently in Pain?  Yes    Pain Score  2     Pain Location  Leg    Pain Orientation  Left;Lateral;Proximal    Pain Descriptors / Indicators  Aching    Pain Type  Chronic pain    Pain Radiating Towards  just in LT lateral hip    Pain Onset  More than a month ago    Pain Frequency  Intermittent                       [facility] Adult PT Treatment/Exercise - 03/06/19 0001      Knee/Hip Exercises: Stretches   ITB Stretch  Left;2 reps;30 seconds    Piriformis Stretch  Left;3 reps;30 seconds    Piriformis Stretch Limitations  incl. gluteus medius, lateral hip stretch      Knee/Hip Exercises: Aerobic   Nustep  6 min level 4       Knee/Hip Exercises: Standing   Other Standing Knee Exercises  10 LT step ups on stair with bil UE support, standing marching at counter 15 x    pain/discomfort in outside of knee   Other Standing Knee Exercises  standing red  t band hip ext/ hip abd RT and LT 2 x 10        Knee/Hip Exercises: Seated   Other Seated Knee/Hip Exercises  IR and ER with red t band 2 x 10 each LT and RT using knee ball squeeze for ER       Knee/Hip Exercises: Supine   Bridges Limitations  2x10    Other Supine Knee/Hip Exercises  supine march 2x10     Other Supine Knee/Hip Exercises  supine clam shell 2x10 red       Modalities   Modalities  Moist Heat      Moist Heat Therapy   Number Minutes Moist Heat  12 Minutes    Moist Heat Location  Hip   LT     Manual Therapy   Soft tissue mobilization  STM to LT TFL and glut med post TPDN   no pain after TPDN      Trigger Point Dry Needling - 03/06/19 0001    Consent Given?  Yes    Education Handout Provided  Previously provided    Muscles Treated Back/Hip  Gluteus medius;Tensor  fascia lata    Dry Needling Comments  needled in right sidelying position with 32 gauge 60 mm needles    Gluteus Medius Response  Twitch response elicited;Palpable increased muscle length    Tensor Fascia Lata Response  Palpable increased muscle length;Twitch response elicited           PT Education - 03/06/19 1440    Education Details  added standing and t band hip exercises    Person(s) Educated  Patient    Methods  Explanation;Demonstration;Tactile cues;Verbal cues;Handout    Comprehension  Verbalized understanding;Returned demonstration          PT Long Term Goals - 03/06/19 1141      PT LONG TERM GOAL #1   Title  Independent with HEP    Baseline  working on standing HEP from supine    Time  6    Period  Weeks    Status  On-going      PT LONG TERM GOAL #2   Title  Improve FOTO outcome measure score to 29% or less impairment    Baseline  from eval 36%    Time  6    Period  Weeks    Status  Unable to assess      PT LONG TERM GOAL #3   Title  (-) repeat Ober's test for demonstration of decreased IT band pain to improve tolerance for transfers and sitting    Baseline  pt able to transfer without problem but taking extra time    Time  6    Period  Weeks    Status  On-going      PT LONG TERM GOAL #4   Title  Pt able to ride in car 1-2 hours over the holidays 2/10 pain today    Time  6    Period  Weeks    Status  Achieved      PT LONG TERM GOAL #5   Title  Increase left hip strength for abduction, ER and extension at least 1/2 MMT grade for improved gait mechanics to decrease hip pain    Baseline  performing standing exericses but stairs are still painful    Time  6    Period  Weeks    Status  Unable to assess            Plan -  03/06/19 1443    Clinical Impression Statement  Pt enters clinic with 2/10 pain in left hip and requests TPDN.  Pt return demo of standing hip exericises with resistance with red t band and given HEP .  Pt states he was able to  ride in the care over the holidays for up to 2 hours and is pleased with progress so far.    Personal Factors and Comorbidities  Comorbidity 2    Comorbidities  chronic LBP, cancer history    Examination-Activity Limitations  Stand;Locomotion Level;Sit    Stability/Clinical Decision Making  Stable/Uncomplicated    Rehab Potential  Good    PT Frequency  --   1-2 x a week   PT Treatment/Interventions  ADLs/Self Care Home Management;Iontophoresis 4mg /ml Dexamethasone;Moist Heat;Cryotherapy;Therapeutic exercise;Therapeutic activities;Gait training;Neuromuscular re-education;Functional mobility training;Patient/family education;Manual techniques;Dry needling;Taping    PT Next Visit Plan  consider standing exercises, needling PRN.    PT Home Exercise Plan  supine IT band and lateral hip/piriformis stretches, hip abduction SLR (standing) step ups as able,    Consulted and Agree with Plan of Care  Patient       Patient will benefit from skilled therapeutic intervention in order to improve the following deficits and impairments:  Pain, Impaired flexibility, Decreased strength, Difficulty walking, Decreased range of motion  Visit Diagnosis: Pain in left hip  Pain in left thigh  Muscle weakness (generalized)     Problem List Patient Active Problem List   Diagnosis Date Noted  . Chemotherapy-induced neuropathy ([place]) 05/18/2018  . Goals of care, counseling/discussion 12/13/2017  . Encounter for antineoplastic chemotherapy 12/13/2017  . Status post surgery 09/19/2017  . S/P lobectomy of lung 09/19/2017  . Squamous cell carcinoma lung, right ([place])   . COPD GOLD I  07/13/2017  . Essential hypertension 07/13/2017  . Mass of middle lobe of right lung 07/12/2017   Voncille Lo, PT Certified Exercise Expert for the Aging Adult  03/06/19 2:46 PM Phone: [phone] Fax: [facility] [address] [place], [place],  [address] Phone: [phone]   Fax:  [phone]  Name: Danny Duke MRN: [id number] Date of Birth: 02-03-1936

## 2019-03-12 ENCOUNTER — Ambulatory Visit: Payer: Medicare Other | Admitting: Physical Therapy

## 2019-03-16 ENCOUNTER — Ambulatory Visit: Payer: Medicare Other | Admitting: Physical Therapy

## 2019-03-22 ENCOUNTER — Ambulatory Visit: Payer: Medicare Other | Admitting: Physical Therapy

## 2019-03-30 ENCOUNTER — Ambulatory Visit: Payer: Medicare Other | Attending: Orthopedic Surgery | Admitting: Physical Therapy

## 2019-03-30 ENCOUNTER — Other Ambulatory Visit: Payer: Self-pay

## 2019-03-30 ENCOUNTER — Encounter: Payer: Self-pay | Admitting: Physical Therapy

## 2019-03-30 DIAGNOSIS — M25552 Pain in left hip: Secondary | ICD-10-CM | POA: Insufficient documentation

## 2019-03-30 DIAGNOSIS — M79652 Pain in left thigh: Secondary | ICD-10-CM

## 2019-03-30 DIAGNOSIS — M6281 Muscle weakness (generalized): Secondary | ICD-10-CM

## 2019-03-30 NOTE — Therapy (Signed)
Danny Duke, [place], [address] Phone: [phone]   Fax:  [phone]  Physical Therapy Treatment/Discharge  Patient Details  Name: Danny Duke MRN: [id number] Date of Birth: [birth year] Referring Provider (PT): Rod Can, MD   Encounter Date: 03/30/2019  PT End of Session - 03/30/19 0850    Visit Number  4    Number of Visits  12    Date for PT Re-Evaluation  03/30/19    Authorization Type  [employer]HC Medicare, progress note by visit 10    PT Start Time  0849    PT Stop Time  0927   30 minutes direct tx. time, time spent dry needling not included in direct minutes   PT Time Calculation (min)  38 min    Activity Tolerance  Patient tolerated treatment well    Behavior During Therapy  [facility] for tasks assessed/performed       Past Medical History:  Diagnosis Date  . Cancer [facility])    possible lung cancer  . Chronic back pain   . Hypercholesterolemia   . Hypertension     Past Surgical History:  Procedure Laterality Date  . BRONCHIAL NEEDLE ASPIRATION BIOPSY  08/05/2017   Procedure: BRONCHIAL NEEDLE ASPIRATION BIOPSIES;  Surgeon: Juanito Doom, MD;  [facility];  Service: Cardiopulmonary;;  . ENDOBRONCHIAL ULTRASOUND Bilateral 08/05/2017   Procedure: ENDOBRONCHIAL ULTRASOUND;  Surgeon: Juanito Doom, MD;  [facility];  Service: Cardiopulmonary;  Laterality: Bilateral;  . IR IMAGING GUIDED PORT INSERTION  12/27/2017  . MEDIASTINOSCOPY N/A 09/19/2017   Procedure: MEDIASTINOSCOPY;  Surgeon: [name], MD;  Location: [facility] OR;  Service: Thoracic;  Laterality: N/A;  . MULTIPLE TOOTH EXTRACTIONS    . VIDEO ASSISTED THORACOSCOPY (VATS)/ LOBECTOMY Right 09/19/2017   Procedure: RIGHT VIDEO ASSISTED THORACOSCOPY (VATS)/ LOBECTOMY;  Surgeon: [name], MD;  Location: [place];  Service: Thoracic;  Laterality: Right;    There were no vitals filed for this  visit.  Subjective Assessment - 03/30/19 1342    Subjective  [name] returns, not seen for therapy for the past 3 1/2 weeks. He reports his left hip/thigh are doing better with no pain today and no symptoms for the past week ot two. Discussed given improvement plan d/c to HEP and pt. will follow up with MD if having any future changes in status.    Pertinent History  recent lung CA, chronic back pain s/p injections    Patient Stated Goals  Get rid of leg pain    Currently in Pain?  No/denies         [facility] PT Assessment - 03/30/19 0001      Observation/Other Assessments   Focus on Therapeutic Outcomes (FOTO)   1% limited      Strength   Right Hip Flexion  4+/5    Right Hip Extension  5/5    Right Hip External Rotation   5/5    Right Hip Internal Rotation  5/5    Right Hip ABduction  5/5    Right Hip ADduction  5/5    Left Hip Flexion  4+/5    Left Hip Extension  4+/5    Left Hip External Rotation  5/5    Left Hip Internal Rotation  5/5    Left Hip ABduction  4+/5    Left Hip ADduction  4+/5  [facility] Adult PT Treatment/Exercise - 03/30/19 0001      Knee/Hip Exercises: Stretches   ITB Stretch  Left;2 reps;30 seconds    Piriformis Stretch  Left;3 reps;30 seconds      Knee/Hip Exercises: Standing   Hip Abduction  AROM;Stengthening;Left;2 sets;10 reps;Knee straight    Abduction Limitations  green Theraband initially at ankles but switched to proximal to knees for easier performance      Knee/Hip Exercises: Supine   Other Supine Knee/Hip Exercises  clamsheel x 15 reps with green Theraband      Knee/Hip Exercises: Sidelying   Hip ABduction  AROM;Strengthening;Left;10 reps       Trigger Point Dry Needling - 03/30/19 0001    Consent Given?  Yes    Muscles Treated Back/Hip  Gluteus minimus;Gluteus medius;Piriformis;Tensor fascia lata    Dry Needling Comments  needling performed in right sidelying with 30-32 gauge 60-75 mm needles            PT Education - 03/30/19 1346    Education Details  HEP, POC    Person(s) Educated  Patient    Methods  Explanation;Demonstration;Verbal cues    Comprehension  Returned demonstration;Verbalized understanding          PT Long Term Goals - 03/30/19 1350      PT LONG TERM GOAL #1   Title  Independent with HEP    Baseline  met    Time  6    Period  Weeks    Status  Achieved      PT LONG TERM GOAL #2   Title  Improve FOTO outcome measure score to 29% or less impairment    Baseline  1% limited    Time  6    Period  Weeks    Status  Achieved      PT LONG TERM GOAL #3   Title  (-) repeat Ober's test for demonstration of decreased IT band pain to improve tolerance for transfers and sitting    Baseline  (-) Ober's    Time  6    Period  Weeks    Status  Achieved      PT LONG TERM GOAL #4   Title  Pt able to ride in car 1-2 hours over the holidays    Baseline  met    Time  6    Period  Weeks    Status  Achieved      PT LONG TERM GOAL #5   Title  Increase left hip strength for abduction, ER and extension at least 1/2 MMT grade for improved gait mechanics to decrease hip pain    Baseline  met/able    Time  6    Period  Weeks    Status  Achieved            Plan - 03/30/19 1348    Clinical Impression Statement  [name] has progresed well with therapy with no current pain complaints and all therapy goals met. No further forma therapy needed at this time-he will continue with HEP and follow up with MD with any future changes in status.    Personal Factors and Comorbidities  Comorbidity 2    Comorbidities  chronic LBP, cancer history    Stability/Clinical Decision Making  Stable/Uncomplicated    Clinical Decision Making  Low    Rehab Potential  Good    PT Frequency  --   1-2x/week   PT Duration  6 weeks    PT Treatment/Interventions  ADLs/Self Care Home Management;Iontophoresis 2m/ml Dexamethasone;Moist Heat;Cryotherapy;Therapeutic exercise;Therapeutic  activities;Gait training;Neuromuscular re-education;Functional mobility training;Patient/family education;Manual techniques;Dry needling;Taping    PT Next Visit Plan  NA    PT Home Exercise Plan  supine IT band and lateral hip/piriformis stretches, hip abduction SLR (standing with Theraband vs. sidelying without resistance)    Consulted and Agree with Plan of Care  Patient       Patient will benefit from skilled therapeutic intervention in order to improve the following deficits and impairments:  Pain, Impaired flexibility, Difficulty walking, Decreased range of motion  Visit Diagnosis: Pain in left hip  Pain in left thigh  Muscle weakness (generalized)     Problem List Patient Active Problem List   Diagnosis Date Noted  . Chemotherapy-induced neuropathy ([place] 05/18/2018  . Goals of care, counseling/discussion 12/13/2017  . Encounter for antineoplastic chemotherapy 12/13/2017  . Status post surgery 09/19/2017  . S/P lobectomy of lung 09/19/2017  . Squamous cell carcinoma lung, right ([place]   . COPD GOLD I  07/13/2017  . Essential hypertension 07/13/2017  . Mass of middle lobe of right lung 07/12/2017      PHYSICAL THERAPY DISCHARGE SUMMARY  Visits from Start of Care: 4  Current functional level related to goals / functional outcomes: Therapy goals met, no current IT band/hip pain   Remaining deficits: NA   Education / Equipment: HEP, issued green Theraband Plan: Patient agrees to discharge.  Patient goals were met. Patient is being discharged due to meeting the stated rehab goals.  ?????         CBeaulah Dinning PT, DPT 03/30/19 1:54 PM      [facility] [address]Phone: [phone]  Fax:  [phone] Name: SLENARDO WESTWOODMRN: [id number] of Birth: 7Dec 24, 1937

## 2019-04-02 ENCOUNTER — Telehealth: Payer: Self-pay | Admitting: Medical Oncology

## 2019-04-02 NOTE — Telephone Encounter (Signed)
"  Can Danny Duke take the COVID vaccine"? I told wife that pt should get the Vaccine when it is available.

## 2019-04-05 ENCOUNTER — Encounter: Payer: Self-pay | Admitting: Internal Medicine

## 2019-04-09 ENCOUNTER — Telehealth: Payer: Self-pay

## 2019-04-09 ENCOUNTER — Other Ambulatory Visit: Payer: Self-pay | Admitting: Internal Medicine

## 2019-04-09 DIAGNOSIS — G620 Drug-induced polyneuropathy: Secondary | ICD-10-CM

## 2019-04-09 NOTE — Telephone Encounter (Signed)
NOTES ON FILE FROM [place] PHONE [phone]...Marland KitchenREFERRAL SENT TO SCHEDULING

## 2019-04-23 ENCOUNTER — Inpatient Hospital Stay: Payer: Medicare Other | Attending: Internal Medicine

## 2019-04-23 ENCOUNTER — Other Ambulatory Visit: Payer: Self-pay

## 2019-04-23 DIAGNOSIS — C342 Malignant neoplasm of middle lobe, bronchus or lung: Secondary | ICD-10-CM | POA: Diagnosis present

## 2019-04-23 DIAGNOSIS — Z452 Encounter for adjustment and management of vascular access device: Secondary | ICD-10-CM | POA: Diagnosis not present

## 2019-04-23 DIAGNOSIS — C3491 Malignant neoplasm of unspecified part of right bronchus or lung: Secondary | ICD-10-CM

## 2019-04-23 MED ORDER — SODIUM CHLORIDE 0.9% FLUSH
10.0000 mL | INTRAVENOUS | Status: DC | PRN
  Administered 2019-04-23: 12:00:00 10 mL
  Filled 2019-04-23: qty 10

## 2019-04-23 MED ORDER — HEPARIN SOD (PORK) LOCK FLUSH 100 UNIT/ML IV SOLN
500.0000 [IU] | Freq: Once | INTRAVENOUS | Status: AC | PRN
  Administered 2019-04-23: 500 [IU]
  Filled 2019-04-23: qty 5

## 2019-05-04 ENCOUNTER — Ambulatory Visit: Payer: Medicare Other | Attending: Internal Medicine

## 2019-05-04 DIAGNOSIS — Z23 Encounter for immunization: Secondary | ICD-10-CM | POA: Insufficient documentation

## 2019-05-04 NOTE — Progress Notes (Signed)
   Covid-19 Vaccination Clinic  Name:  Danny Duke    MRN: [id number] DOB: [birth year]  05/04/2019  [name] was observed post Covid-19 immunization for 15 minutes without incidence. He was provided with Vaccine Information Sheet and instruction to access the V-Safe system.   [name] was instructed to call 911 with any severe reactions post vaccine: Marland Kitchen Difficulty breathing  . Swelling of your face and throat  . A fast heartbeat  . A bad rash all over your body  . Dizziness and weakness    Immunizations Administered    Name Date Dose VIS Date Route   Pfizer COVID-19 Vaccine 05/04/2019 12:05 PM 0.3 mL 02/16/2019 Intramuscular   Manufacturer: [place]   Lot: [id number]   [place]: [id number]

## 2019-05-29 ENCOUNTER — Ambulatory Visit: Payer: Medicare Other | Attending: Internal Medicine

## 2019-05-29 DIAGNOSIS — Z23 Encounter for immunization: Secondary | ICD-10-CM

## 2019-05-29 NOTE — Progress Notes (Signed)
   Covid-19 Vaccination Clinic  Name:  Danny Duke    MRN: [id number] DOB: December 12, 1935  05/29/2019  [name] was observed post Covid-19 immunization for 15 minutes without incident. He was provided with Vaccine Information Sheet and instruction to access the V-Safe system.   [name] was instructed to call 911 with any severe reactions post vaccine: Marland Kitchen Difficulty breathing  . Swelling of face and throat  . A fast heartbeat  . A bad rash all over body  . Dizziness and weakness   Immunizations Administered    Name Date Dose VIS Date Route   Pfizer COVID-19 Vaccine 05/29/2019  2:29 PM 0.3 mL 02/16/2019 Intramuscular   Manufacturer: [place]   Lot: [id number]   [place]: [id number]

## 2019-06-05 ENCOUNTER — Other Ambulatory Visit: Payer: Self-pay | Admitting: Internal Medicine

## 2019-06-05 NOTE — Telephone Encounter (Signed)
Rx Request 

## 2019-06-13 ENCOUNTER — Other Ambulatory Visit: Payer: Self-pay | Admitting: Internal Medicine

## 2019-06-13 DIAGNOSIS — G620 Drug-induced polyneuropathy: Secondary | ICD-10-CM

## 2019-06-13 NOTE — Telephone Encounter (Signed)
Rx request 

## 2019-06-19 ENCOUNTER — Other Ambulatory Visit: Payer: Self-pay

## 2019-06-19 ENCOUNTER — Encounter (HOSPITAL_COMMUNITY): Payer: Self-pay

## 2019-06-19 ENCOUNTER — Inpatient Hospital Stay: Payer: Medicare Other

## 2019-06-19 ENCOUNTER — Inpatient Hospital Stay: Payer: Medicare Other | Attending: Internal Medicine

## 2019-06-19 ENCOUNTER — Ambulatory Visit (HOSPITAL_COMMUNITY)
Admission: RE | Admit: 2019-06-19 | Discharge: 2019-06-19 | Disposition: A | Discharge status: Home / Self Care | Payer: Medicare Other | Source: Ambulatory Visit | Attending: Internal Medicine | Admitting: Internal Medicine

## 2019-06-19 DIAGNOSIS — C342 Malignant neoplasm of middle lobe, bronchus or lung: Secondary | ICD-10-CM | POA: Insufficient documentation

## 2019-06-19 DIAGNOSIS — E7800 Pure hypercholesterolemia, unspecified: Secondary | ICD-10-CM | POA: Insufficient documentation

## 2019-06-19 DIAGNOSIS — C3490 Malignant neoplasm of unspecified part of unspecified bronchus or lung: Secondary | ICD-10-CM | POA: Insufficient documentation

## 2019-06-19 DIAGNOSIS — G8929 Other chronic pain: Secondary | ICD-10-CM | POA: Diagnosis not present

## 2019-06-19 DIAGNOSIS — J439 Emphysema, unspecified: Secondary | ICD-10-CM | POA: Diagnosis not present

## 2019-06-19 DIAGNOSIS — Z79899 Other long term (current) drug therapy: Secondary | ICD-10-CM | POA: Diagnosis not present

## 2019-06-19 DIAGNOSIS — G620 Drug-induced polyneuropathy: Secondary | ICD-10-CM | POA: Insufficient documentation

## 2019-06-19 DIAGNOSIS — T451X5A Adverse effect of antineoplastic and immunosuppressive drugs, initial encounter: Secondary | ICD-10-CM | POA: Diagnosis not present

## 2019-06-19 DIAGNOSIS — Z7982 Long term (current) use of aspirin: Secondary | ICD-10-CM | POA: Insufficient documentation

## 2019-06-19 DIAGNOSIS — G629 Polyneuropathy, unspecified: Secondary | ICD-10-CM | POA: Diagnosis not present

## 2019-06-19 DIAGNOSIS — Z9221 Personal history of antineoplastic chemotherapy: Secondary | ICD-10-CM | POA: Diagnosis not present

## 2019-06-19 DIAGNOSIS — I10 Essential (primary) hypertension: Secondary | ICD-10-CM | POA: Diagnosis not present

## 2019-06-19 DIAGNOSIS — C3491 Malignant neoplasm of unspecified part of right bronchus or lung: Secondary | ICD-10-CM

## 2019-06-19 LAB — CBC WITH DIFFERENTIAL (CANCER CENTER ONLY)
Abs Immature Granulocytes: 0.02 10*3/uL (ref 0.00–0.07)
Basophils Absolute: 0 10*3/uL (ref 0.0–0.1)
Basophils Relative: 0 %
Eosinophils Absolute: 0.2 10*3/uL (ref 0.0–0.5)
Eosinophils Relative: 3 %
HCT: 39.5 % (ref 39.0–52.0)
Hemoglobin: 12.9 g/dL — ABNORMAL LOW (ref 13.0–17.0)
Immature Granulocytes: 0 %
Lymphocytes Relative: 37 %
Lymphs Abs: 2.2 10*3/uL (ref 0.7–4.0)
MCH: 30.9 pg (ref 26.0–34.0)
MCHC: 32.7 g/dL (ref 30.0–36.0)
MCV: 94.5 fL (ref 80.0–100.0)
Monocytes Absolute: 0.7 10*3/uL (ref 0.1–1.0)
Monocytes Relative: 12 %
Neutro Abs: 2.8 10*3/uL (ref 1.7–7.7)
Neutrophils Relative %: 48 %
Platelet Count: 135 10*3/uL — ABNORMAL LOW (ref 150–400)
RBC: 4.18 MIL/uL — ABNORMAL LOW (ref 4.22–5.81)
RDW: 14.2 % (ref 11.5–15.5)
WBC Count: 5.9 10*3/uL (ref 4.0–10.5)
nRBC: 0 % (ref 0.0–0.2)

## 2019-06-19 LAB — CMP (CANCER CENTER ONLY)
ALT: 14 U/L (ref 0–44)
AST: 26 U/L (ref 15–41)
Albumin: 3.7 g/dL (ref 3.5–5.0)
Alkaline Phosphatase: 66 U/L (ref 38–126)
Anion gap: 8 (ref 5–15)
BUN: 11 mg/dL (ref 8–23)
CO2: 24 mmol/L (ref 22–32)
Calcium: 9.2 mg/dL (ref 8.9–10.3)
Chloride: 110 mmol/L (ref 98–111)
Creatinine: 1.35 mg/dL — ABNORMAL HIGH (ref 0.61–1.24)
GFR, Est AFR Am: 57 mL/min — ABNORMAL LOW (ref >60)
GFR, Estimated: 49 mL/min — ABNORMAL LOW (ref >60)
Glucose, Bld: 99 mg/dL (ref 70–99)
Potassium: 4 mmol/L (ref 3.5–5.1)
Sodium: 142 mmol/L (ref 135–145)
Total Bilirubin: 0.5 mg/dL (ref 0.3–1.2)
Total Protein: 7.7 g/dL (ref 6.5–8.1)

## 2019-06-19 MED ORDER — IOHEXOL 300 MG/ML  SOLN
75.0000 mL | Freq: Once | INTRAMUSCULAR | Status: AC | PRN
  Administered 2019-06-19: 75 mL via INTRAVENOUS

## 2019-06-19 MED ORDER — SODIUM CHLORIDE 0.9% FLUSH
10.0000 mL | INTRAVENOUS | Status: DC | PRN
  Administered 2019-06-19: 10 mL
  Filled 2019-06-19: qty 10

## 2019-06-19 MED ORDER — HEPARIN SOD (PORK) LOCK FLUSH 100 UNIT/ML IV SOLN
INTRAVENOUS | Status: AC
  Filled 2019-06-19: qty 5

## 2019-06-19 MED ORDER — SODIUM CHLORIDE (PF) 0.9 % IJ SOLN
INTRAMUSCULAR | Status: AC
  Filled 2019-06-19: qty 50

## 2019-06-19 MED ORDER — HEPARIN SOD (PORK) LOCK FLUSH 100 UNIT/ML IV SOLN
500.0000 [IU] | Freq: Once | INTRAVENOUS | Status: AC
  Administered 2019-06-19: 500 [IU] via INTRAVENOUS

## 2019-06-21 ENCOUNTER — Other Ambulatory Visit: Payer: Self-pay

## 2019-06-21 ENCOUNTER — Encounter: Payer: Self-pay | Admitting: Internal Medicine

## 2019-06-21 ENCOUNTER — Inpatient Hospital Stay: Payer: Medicare Other | Admitting: Internal Medicine

## 2019-06-21 ENCOUNTER — Telehealth: Payer: Self-pay | Admitting: Internal Medicine

## 2019-06-21 VITALS — BP 126/79 | HR 84 | Temp 97.8°F | Resp 18 | Ht 68.0 in | Wt 177.6 lb

## 2019-06-21 DIAGNOSIS — I10 Essential (primary) hypertension: Secondary | ICD-10-CM | POA: Diagnosis not present

## 2019-06-21 DIAGNOSIS — C3491 Malignant neoplasm of unspecified part of right bronchus or lung: Secondary | ICD-10-CM | POA: Diagnosis not present

## 2019-06-21 DIAGNOSIS — C342 Malignant neoplasm of middle lobe, bronchus or lung: Secondary | ICD-10-CM | POA: Diagnosis not present

## 2019-06-21 DIAGNOSIS — C3490 Malignant neoplasm of unspecified part of unspecified bronchus or lung: Secondary | ICD-10-CM | POA: Diagnosis not present

## 2019-06-21 NOTE — Telephone Encounter (Signed)
Scheduled per los. Gave avs and calendar  

## 2019-06-21 NOTE — Progress Notes (Signed)
[place] Telephone:([phone]   Fax:([phone]  OFFICE PROGRESS NOTE  [name], PA-C [place]  [place] [address]  DIAGNOSIS: Stage IIB (T3, N0, M0) non-small cell lung cancer, invasive well-differentiated squamous cell carcinoma presented with right middle lobe lung mass.  PRIOR THERAPY:  1) Status post right VATS, right middle lobectomy with mediastinal lymph node sampling under the care of Dr. Roxan Hockey on 09/19/2017.  The tumor measured 4.2 cm but the carcinoma extends through the visceral pleura. 2) Adjuvant systemic chemotherapy with carboplatin for AUC of 6 and paclitaxel 200 mg/M2 every 3 weeks.  First dose 11/16/2017.  Status post 3 cycles.  Last dose was giving January 05, 2018 discontinued secondary to intolerance with significant peripheral neuropathy.  CURRENT THERAPY: Observation.  INTERVAL HISTORY: Danny Duke 84 y.o. male returns to the clinic today for 6 months follow-up visit.  The patient is feeling fine today with no concerning complaints.  He denied having any chest pain, shortness of breath, cough or hemoptysis.  He denied having any fever or chills.  He has no nausea, vomiting, diarrhea or constipation.  He denied having any headache or visual changes.  He is here today for evaluation with repeat CT scan of the chest for restaging of his disease.  MEDICAL HISTORY: Past Medical History:  Diagnosis Date  . Chronic back pain   . Hypercholesterolemia   . Hypertension   . lung ca dx'd 09/2017   possible lung cancer    ALLERGIES:  has No Known Allergies.  MEDICATIONS:  Current Outpatient Medications  Medication Sig Dispense Refill  . amLODipine (NORVASC) 10 MG tablet Take 10 mg by mouth daily.    Marland Kitchen aspirin EC 81 MG tablet Take 81 mg by mouth daily.    . chlorproMAZINE (THORAZINE) 25 MG tablet Take 1 tablet (25 mg total) by mouth 3 (three) times daily as needed. 30 tablet 0  . DULoxetine (CYMBALTA) 60 MG  capsule TAKE 1 CAPSULE(60 MG) BY MOUTH DAILY 30 capsule 3  . gabapentin (NEURONTIN) 300 MG capsule TAKE 3 CAPSULES(900 MG) BY MOUTH THREE TIMES DAILY 120 capsule 6  . lidocaine-prilocaine (EMLA) cream Apply 1 application topically as needed. 30 g 0  . lisinopril (PRINIVIL,ZESTRIL) 40 MG tablet Take 40 mg by mouth daily.  1  . losartan (COZAAR) 25 MG tablet TK 1 T PO D    . metoprolol succinate (TOPROL-XL) 25 MG 24 hr tablet Take 1 tablet (25 mg total) by mouth daily. 60 tablet 1  . Oxycodone HCl 20 MG TABS Take 1 tablet by mouth every 6 (six) hours as needed.  0  . prochlorperazine (COMPAZINE) 10 MG tablet Take 1 tablet (10 mg total) by mouth every 6 (six) hours as needed for nausea or vomiting. 30 tablet 0  . simvastatin (ZOCOR) 20 MG tablet Take 20 mg by mouth daily.    . tadalafil (CIALIS) 5 MG tablet Take 5 mg by mouth daily.  12   No current facility-administered medications for this visit.    SURGICAL HISTORY:  Past Surgical History:  Procedure Laterality Date  . BRONCHIAL NEEDLE ASPIRATION BIOPSY  08/05/2017   Procedure: BRONCHIAL NEEDLE ASPIRATION BIOPSIES;  Surgeon: Juanito Doom, MD;  Location: [facility];  Service: Cardiopulmonary;;  . ENDOBRONCHIAL ULTRASOUND Bilateral 08/05/2017   Procedure: ENDOBRONCHIAL ULTRASOUND;  Surgeon: Juanito Doom, MD;  Location: [facility];  Service: Cardiopulmonary;  Laterality: Bilateral;  . IR IMAGING GUIDED PORT INSERTION  12/27/2017  .  MEDIASTINOSCOPY N/A 09/19/2017   Procedure: MEDIASTINOSCOPY;  Surgeon: [name], MD;  Location: [facility] OR;  Service: Thoracic;  Laterality: N/A;  . MULTIPLE TOOTH EXTRACTIONS    . VIDEO ASSISTED THORACOSCOPY (VATS)/ LOBECTOMY Right 09/19/2017   Procedure: RIGHT VIDEO ASSISTED THORACOSCOPY (VATS)/ LOBECTOMY;  Surgeon: [name], MD;  Location: [facility];  Service: Thoracic;  Laterality: Right;    REVIEW OF SYSTEMS:  A comprehensive review of systems was negative.   PHYSICAL  EXAMINATION: General appearance: alert, cooperative and no distress Head: Normocephalic, without obvious abnormality, atraumatic Neck: no adenopathy, no JVD, supple, symmetrical, trachea midline and thyroid not enlarged, symmetric, no tenderness/mass/nodules Lymph nodes: Cervical, supraclavicular, and axillary nodes normal. Resp: clear to auscultation bilaterally Back: symmetric, no curvature. ROM normal. No CVA tenderness. Cardio: regular rate and rhythm, S1, S2 normal, no murmur, click, rub or gallop GI: soft, non-tender; bowel sounds normal; no masses,  no organomegaly Extremities: extremities normal, atraumatic, no cyanosis or edema  ECOG PERFORMANCE STATUS: 1 - Symptomatic but completely ambulatory  Blood pressure 126/79, pulse 84, temperature 97.8 F (36.6 C), temperature source Oral, resp. rate 18, height 5\' 8"  (1.727 m), weight 177 lb 9.6 oz (80.6 kg), SpO2 99 %.  LABORATORY DATA: Lab Results  Component Value Date   WBC 5.9 06/19/2019   HGB 12.9 (L) 06/19/2019   HCT 39.5 06/19/2019   MCV 94.5 06/19/2019   PLT 135 (L) 06/19/2019      Chemistry      Component Value Date/Time   NA 142 06/19/2019 1135   K 4.0 06/19/2019 1135   CL 110 06/19/2019 1135   CO2 24 06/19/2019 1135   BUN 11 06/19/2019 1135   CREATININE 1.35 (H) 06/19/2019 1135      Component Value Date/Time   CALCIUM 9.2 06/19/2019 1135   ALKPHOS 66 06/19/2019 1135   AST 26 06/19/2019 1135   ALT 14 06/19/2019 1135   BILITOT 0.5 06/19/2019 1135       RADIOGRAPHIC STUDIES: CT Chest W Contrast  Result Date: 06/19/2019 CLINICAL DATA:  Non-small cell lung cancer staging. EXAM: CT CHEST WITH CONTRAST TECHNIQUE: Multidetector CT imaging of the chest was performed during intravenous contrast administration. CONTRAST:  70mL OMNIPAQUE IOHEXOL 300 MG/ML  SOLN COMPARISON:  12/19/2018 FINDINGS: Cardiovascular: Calcified atherosclerotic changes in the thoracic aorta. Tortuous origin of the great vessels in the chest.  Heart size is normal and shift to the right following partial lung resection in the right chest. No pericardial effusion. Central pulmonary vasculature is unremarkable. Mediastinum/Nodes: Right-sided Port-A-Cath terminating in the Mild thyroid heterogeneity similar to the prior study. Right paratracheal lymph node (image 42, series 2) 1.8 cm short axis unchanged since April of 2020. However, this was approximately 16 mm on the prior exam when measured in a similar fashion. Right hilar lymph node (image 59, series 2) 18 mm on today's study, stable. No axillary lymphadenopathy. Lungs/Pleura: Biapical scarring and emphysematous changes with wedge resection in the right upper lobe and findings of right middle lobectomy with similar appearance Small area of pleural nodularity in the right posterior chest (image 62, series 7) new from the previous imaging evaluation measuring 9 mm. Airways are patent. No pleural effusion. Stable small pulmonary nodule (image 64, series 7) approximately 4 mm in the left upper lobe. Upper Abdomen: Unchanged pancreatic ductal dilation dating back to December of 2019 partially imaged. No acute upper abdominal findings. Patulous esophagus in the mediastinum with fluid level. Adrenal glands are normal. Low-density lesions in the kidneys  are incompletely imaged. Likely small cysts. Musculoskeletal: No chest wall mass. No destructive bone process. Spinal degenerative changes. IMPRESSION: 1. Small area of pleural nodularity in the right posterior right chest is new from the previous imaging evaluation. This could represent a new focus of disease. 2. Stable right paratracheal and right hilar lymphadenopathy. 3. Stable 4 mm left upper lobe pulmonary nodule. 4. Patulous esophagus in the mediastinum with fluid level. May be indicative of esophageal dysmotility 5. Emphysema and aortic atherosclerosis. Aortic Atherosclerosis (ICD10-I70.0) and Emphysema (ICD10-J43.9). Electronically Signed   By: Zetta Bills M.D.   On: 06/19/2019 18:03    ASSESSMENT AND PLAN: This is a very pleasant 84 years old African-American male recently diagnosed with stage IIB (T3, N0, M0) invasive well-differentiated squamous cell carcinoma presented with right middle lobe lung mass status post right middle lobectomy with lymph node sampling on September 19, 2017 under the care of Dr. Roxan Hockey. The patient underwent adjuvant treatment with systemic chemotherapy with carboplatin for AUC of 6 and paclitaxel 200 mg/M2 every 3 weeks with Neulasta support status post 3 cycles. He tolerated this treatment well except for the chemotherapy-induced peripheral neuropathy and he discontinued his treatment after cycle #3. The patient is currently on observation and he is feeling fine today with no concerning complaints. Repeat CT scan of the chest showed no concerning findings for disease recurrence or progression but there was a small area of pleural nodularity in the right posterior right chest that is new from the previous imaging studies.  I discussed the scan results with the patient and recommended for him to have repeat CT scan of the chest in 3 months for further evaluation of this pleural-based nodule and to rule out any disease recurrence. If the patient has no disease recurrence on the upcoming scan, we may consider removing his Port-A-Cath. He will continue with Port-A-Cath flush every 6 weeks during this period. The patient was advised to call immediately if he has any concerning symptoms in the interval. The patient voices understanding of current disease status and treatment options and is in agreement with the current care plan.  All questions were answered. The patient knows to call the clinic with any problems, questions or concerns. We can certainly see the patient much sooner if necessary.  Disclaimer: This note was dictated with voice recognition software. Similar sounding words can inadvertently be transcribed and  may not be corrected upon review.

## 2019-08-02 ENCOUNTER — Other Ambulatory Visit: Payer: Self-pay

## 2019-08-02 ENCOUNTER — Inpatient Hospital Stay: Payer: Medicare Other | Attending: Internal Medicine

## 2019-08-02 DIAGNOSIS — C342 Malignant neoplasm of middle lobe, bronchus or lung: Secondary | ICD-10-CM | POA: Diagnosis present

## 2019-08-02 DIAGNOSIS — C3491 Malignant neoplasm of unspecified part of right bronchus or lung: Secondary | ICD-10-CM

## 2019-08-02 MED ORDER — SODIUM CHLORIDE 0.9% FLUSH
10.0000 mL | INTRAVENOUS | Status: DC | PRN
  Administered 2019-08-02: 10 mL
  Filled 2019-08-02: qty 10

## 2019-08-02 MED ORDER — HEPARIN SOD (PORK) LOCK FLUSH 100 UNIT/ML IV SOLN
500.0000 [IU] | Freq: Once | INTRAVENOUS | Status: AC | PRN
  Administered 2019-08-02: 500 [IU]
  Filled 2019-08-02: qty 5

## 2019-09-04 ENCOUNTER — Other Ambulatory Visit (HOSPITAL_BASED_OUTPATIENT_CLINIC_OR_DEPARTMENT_OTHER): Payer: Self-pay

## 2019-09-04 DIAGNOSIS — R0683 Snoring: Secondary | ICD-10-CM

## 2019-09-17 ENCOUNTER — Telehealth: Payer: Self-pay | Admitting: Medical Oncology

## 2019-09-17 NOTE — Telephone Encounter (Signed)
Confirmed appt.

## 2019-09-18 ENCOUNTER — Ambulatory Visit (HOSPITAL_COMMUNITY)
Admission: RE | Admit: 2019-09-18 | Discharge: 2019-09-18 | Disposition: A | Discharge status: Home / Self Care | Payer: Medicare Other | Source: Ambulatory Visit | Attending: Internal Medicine | Admitting: Internal Medicine

## 2019-09-18 ENCOUNTER — Other Ambulatory Visit: Payer: Self-pay

## 2019-09-18 ENCOUNTER — Inpatient Hospital Stay: Payer: Medicare Other

## 2019-09-18 ENCOUNTER — Inpatient Hospital Stay: Payer: Medicare Other | Attending: Internal Medicine

## 2019-09-18 ENCOUNTER — Encounter (HOSPITAL_COMMUNITY): Payer: Self-pay

## 2019-09-18 DIAGNOSIS — K760 Fatty (change of) liver, not elsewhere classified: Secondary | ICD-10-CM | POA: Diagnosis not present

## 2019-09-18 DIAGNOSIS — I10 Essential (primary) hypertension: Secondary | ICD-10-CM | POA: Insufficient documentation

## 2019-09-18 DIAGNOSIS — T451X5A Adverse effect of antineoplastic and immunosuppressive drugs, initial encounter: Secondary | ICD-10-CM | POA: Insufficient documentation

## 2019-09-18 DIAGNOSIS — I714 Abdominal aortic aneurysm, without rupture: Secondary | ICD-10-CM | POA: Insufficient documentation

## 2019-09-18 DIAGNOSIS — I700 Atherosclerosis of aorta: Secondary | ICD-10-CM | POA: Insufficient documentation

## 2019-09-18 DIAGNOSIS — C342 Malignant neoplasm of middle lobe, bronchus or lung: Secondary | ICD-10-CM | POA: Insufficient documentation

## 2019-09-18 DIAGNOSIS — E7800 Pure hypercholesterolemia, unspecified: Secondary | ICD-10-CM | POA: Diagnosis not present

## 2019-09-18 DIAGNOSIS — Z79899 Other long term (current) drug therapy: Secondary | ICD-10-CM | POA: Insufficient documentation

## 2019-09-18 DIAGNOSIS — C3491 Malignant neoplasm of unspecified part of right bronchus or lung: Secondary | ICD-10-CM

## 2019-09-18 DIAGNOSIS — C3490 Malignant neoplasm of unspecified part of unspecified bronchus or lung: Secondary | ICD-10-CM

## 2019-09-18 DIAGNOSIS — Z7982 Long term (current) use of aspirin: Secondary | ICD-10-CM | POA: Insufficient documentation

## 2019-09-18 DIAGNOSIS — I2510 Atherosclerotic heart disease of native coronary artery without angina pectoris: Secondary | ICD-10-CM | POA: Diagnosis not present

## 2019-09-18 DIAGNOSIS — G620 Drug-induced polyneuropathy: Secondary | ICD-10-CM | POA: Insufficient documentation

## 2019-09-18 DIAGNOSIS — J439 Emphysema, unspecified: Secondary | ICD-10-CM | POA: Insufficient documentation

## 2019-09-18 LAB — CBC WITH DIFFERENTIAL (CANCER CENTER ONLY)
Abs Immature Granulocytes: 0.01 10*3/uL (ref 0.00–0.07)
Basophils Absolute: 0 10*3/uL (ref 0.0–0.1)
Basophils Relative: 0 %
Eosinophils Absolute: 0.1 10*3/uL (ref 0.0–0.5)
Eosinophils Relative: 2 %
HCT: 38 % — ABNORMAL LOW (ref 39.0–52.0)
Hemoglobin: 12.6 g/dL — ABNORMAL LOW (ref 13.0–17.0)
Immature Granulocytes: 0 %
Lymphocytes Relative: 28 %
Lymphs Abs: 1.6 10*3/uL (ref 0.7–4.0)
MCH: 31.1 pg (ref 26.0–34.0)
MCHC: 33.2 g/dL (ref 30.0–36.0)
MCV: 93.8 fL (ref 80.0–100.0)
Monocytes Absolute: 0.7 10*3/uL (ref 0.1–1.0)
Monocytes Relative: 13 %
Neutro Abs: 3.2 10*3/uL (ref 1.7–7.7)
Neutrophils Relative %: 57 %
Platelet Count: 111 10*3/uL — ABNORMAL LOW (ref 150–400)
RBC: 4.05 MIL/uL — ABNORMAL LOW (ref 4.22–5.81)
RDW: 14 % (ref 11.5–15.5)
WBC Count: 5.5 10*3/uL (ref 4.0–10.5)
nRBC: 0 % (ref 0.0–0.2)

## 2019-09-18 LAB — CMP (CANCER CENTER ONLY)
ALT: 14 U/L (ref 0–44)
AST: 23 U/L (ref 15–41)
Albumin: 3.6 g/dL (ref 3.5–5.0)
Alkaline Phosphatase: 61 U/L (ref 38–126)
Anion gap: 6 (ref 5–15)
BUN: 17 mg/dL (ref 8–23)
CO2: 23 mmol/L (ref 22–32)
Calcium: 9.6 mg/dL (ref 8.9–10.3)
Chloride: 110 mmol/L (ref 98–111)
Creatinine: 1.33 mg/dL — ABNORMAL HIGH (ref 0.61–1.24)
GFR, Est AFR Am: 57 mL/min — ABNORMAL LOW (ref >60)
GFR, Estimated: 49 mL/min — ABNORMAL LOW (ref >60)
Glucose, Bld: 113 mg/dL — ABNORMAL HIGH (ref 70–99)
Potassium: 4 mmol/L (ref 3.5–5.1)
Sodium: 139 mmol/L (ref 135–145)
Total Bilirubin: 0.4 mg/dL (ref 0.3–1.2)
Total Protein: 7.6 g/dL (ref 6.5–8.1)

## 2019-09-18 MED ORDER — IOHEXOL 300 MG/ML  SOLN
75.0000 mL | Freq: Once | INTRAMUSCULAR | Status: AC | PRN
  Administered 2019-09-18: 75 mL via INTRAVENOUS

## 2019-09-18 MED ORDER — HEPARIN SOD (PORK) LOCK FLUSH 100 UNIT/ML IV SOLN
INTRAVENOUS | Status: AC
  Filled 2019-09-18: qty 5

## 2019-09-18 MED ORDER — HEPARIN SOD (PORK) LOCK FLUSH 100 UNIT/ML IV SOLN
500.0000 [IU] | Freq: Once | INTRAVENOUS | Status: AC
  Administered 2019-09-18: 500 [IU] via INTRAVENOUS

## 2019-09-18 MED ORDER — SODIUM CHLORIDE 0.9% FLUSH
10.0000 mL | INTRAVENOUS | Status: DC | PRN
  Administered 2019-09-18: 10 mL
  Filled 2019-09-18: qty 10

## 2019-09-18 MED ORDER — SODIUM CHLORIDE (PF) 0.9 % IJ SOLN
INTRAMUSCULAR | Status: AC
  Filled 2019-09-18: qty 50

## 2019-09-18 NOTE — Patient Instructions (Signed)

## 2019-09-20 ENCOUNTER — Encounter: Payer: Self-pay | Admitting: Internal Medicine

## 2019-09-20 ENCOUNTER — Inpatient Hospital Stay (HOSPITAL_BASED_OUTPATIENT_CLINIC_OR_DEPARTMENT_OTHER): Payer: Medicare Other | Admitting: Internal Medicine

## 2019-09-20 ENCOUNTER — Other Ambulatory Visit: Payer: Self-pay

## 2019-09-20 VITALS — BP 136/86 | HR 83 | Temp 97.3°F | Resp 17 | Ht 68.0 in | Wt 174.9 lb

## 2019-09-20 DIAGNOSIS — I10 Essential (primary) hypertension: Secondary | ICD-10-CM

## 2019-09-20 DIAGNOSIS — C3491 Malignant neoplasm of unspecified part of right bronchus or lung: Secondary | ICD-10-CM

## 2019-09-20 DIAGNOSIS — C342 Malignant neoplasm of middle lobe, bronchus or lung: Secondary | ICD-10-CM | POA: Diagnosis not present

## 2019-09-20 DIAGNOSIS — C3490 Malignant neoplasm of unspecified part of unspecified bronchus or lung: Secondary | ICD-10-CM | POA: Diagnosis not present

## 2019-09-20 NOTE — Progress Notes (Signed)
[place] [phone]   Fax:(336) [phone]  OFFICE PROGRESS NOTE  [name], PA-C [place]  [place] [address]  DIAGNOSIS: Stage IIB (T3, N0, M0) non-small cell lung cancer, invasive well-differentiated squamous cell carcinoma presented with right middle lobe lung mass.  PRIOR THERAPY:  1) Status post right VATS, right middle lobectomy with mediastinal lymph node sampling under the care of Dr. Roxan Hockey on 09/19/2017.  The tumor measured 4.2 cm but the carcinoma extends through the visceral pleura. 2) Adjuvant systemic chemotherapy with carboplatin for AUC of 6 and paclitaxel 200 mg/M2 every 3 weeks.  First dose 11/16/2017.  Status post 3 cycles.  Last dose was giving January 05, 2018 discontinued secondary to intolerance with significant peripheral neuropathy.  CURRENT THERAPY: Observation.  INTERVAL HISTORY: Danny Duke 84 y.o. male returns to the clinic today for follow-up visit accompanied by his wife.  The patient is feeling fine today with no concerning complaints.  He denied having any chest pain but has shortness of breath with exertion with no cough or hemoptysis.  He denied having any recent weight loss or night sweats.  He has no nausea, vomiting, diarrhea or constipation.  He has no headache or visual changes.  He had repeat CT scan of the chest performed recently and he is here for evaluation and discussion of his scan results.  MEDICAL HISTORY: Past Medical History:  Diagnosis Date  . Chronic back pain   . Hypercholesterolemia   . Hypertension   . lung ca dx'd 09/2017   possible lung cancer    ALLERGIES:  has No Known Allergies.  MEDICATIONS:  Current Outpatient Medications  Medication Sig Dispense Refill  . amLODipine (NORVASC) 10 MG tablet Take 10 mg by mouth daily.    Marland Kitchen aspirin EC 81 MG tablet Take 81 mg by mouth daily.    . chlorproMAZINE (THORAZINE) 25 MG tablet Take 1 tablet (25 mg total) by mouth 3  (three) times daily as needed. 30 tablet 0  . DULoxetine (CYMBALTA) 60 MG capsule TAKE 1 CAPSULE(60 MG) BY MOUTH DAILY 30 capsule 3  . gabapentin (NEURONTIN) 300 MG capsule TAKE 3 CAPSULES(900 MG) BY MOUTH THREE TIMES DAILY 120 capsule 6  . lidocaine-prilocaine (EMLA) cream Apply 1 application topically as needed. 30 g 0  . lisinopril (PRINIVIL,ZESTRIL) 40 MG tablet Take 40 mg by mouth daily.  1  . losartan (COZAAR) 25 MG tablet TK 1 T PO D    . metoprolol succinate (TOPROL-XL) 25 MG 24 hr tablet Take 1 tablet (25 mg total) by mouth daily. 60 tablet 1  . Oxycodone HCl 20 MG TABS Take 1 tablet by mouth every 6 (six) hours as needed.  0  . simvastatin (ZOCOR) 20 MG tablet Take 20 mg by mouth daily.    . tadalafil (CIALIS) 5 MG tablet Take 5 mg by mouth daily.  12   No current facility-administered medications for this visit.    SURGICAL HISTORY:  Past Surgical History:  Procedure Laterality Date  . BRONCHIAL NEEDLE ASPIRATION BIOPSY  08/05/2017   Procedure: BRONCHIAL NEEDLE ASPIRATION BIOPSIES;  Surgeon: Juanito Doom, MD;  Location: [facility];  Service: Cardiopulmonary;;  . ENDOBRONCHIAL ULTRASOUND Bilateral 08/05/2017   Procedure: ENDOBRONCHIAL ULTRASOUND;  Surgeon: Juanito Doom, MD;  Location: [facility];  Service: Cardiopulmonary;  Laterality: Bilateral;  . IR IMAGING GUIDED PORT INSERTION  12/27/2017  . MEDIASTINOSCOPY N/A 09/19/2017   Procedure: MEDIASTINOSCOPY;  Surgeon: [name], MD;  Location: [facility] OR;  Service: Thoracic;  Laterality: N/A;  . MULTIPLE TOOTH EXTRACTIONS    . VIDEO ASSISTED THORACOSCOPY (VATS)/ LOBECTOMY Right 09/19/2017   Procedure: RIGHT VIDEO ASSISTED THORACOSCOPY (VATS)/ LOBECTOMY;  Surgeon: [name], MD;  Location: [facility];  Service: Thoracic;  Laterality: Right;    REVIEW OF SYSTEMS:  Constitutional: positive for fatigue Eyes: negative Ears, nose, mouth, throat, and face: negative Respiratory: negative Cardiovascular:  negative Gastrointestinal: negative Genitourinary:negative Integument/breast: negative Hematologic/lymphatic: negative Musculoskeletal:negative Neurological: negative Behavioral/Psych: negative Endocrine: negative Allergic/Immunologic: negative   PHYSICAL EXAMINATION: General appearance: alert, cooperative, fatigued and no distress Head: Normocephalic, without obvious abnormality, atraumatic Neck: no adenopathy, no JVD, supple, symmetrical, trachea midline and thyroid not enlarged, symmetric, no tenderness/mass/nodules Lymph nodes: Cervical, supraclavicular, and axillary nodes normal. Resp: clear to auscultation bilaterally Back: symmetric, no curvature. ROM normal. No CVA tenderness. Cardio: regular rate and rhythm, S1, S2 normal, no murmur, click, rub or gallop GI: soft, non-tender; bowel sounds normal; no masses,  no organomegaly Extremities: extremities normal, atraumatic, no cyanosis or edema Neurologic: Alert and oriented X 3, normal strength and tone. Normal symmetric reflexes. Normal coordination and gait  ECOG PERFORMANCE STATUS: 1 - Symptomatic but completely ambulatory  Blood pressure 136/86, pulse 83, temperature (!) 97.3 F (36.3 C), temperature source Temporal, resp. rate 17, height 5\' 8"  (1.727 m), weight 174 lb 14.4 oz (79.3 kg), SpO2 100 %.  LABORATORY DATA: Lab Results  Component Value Date   WBC 5.5 09/18/2019   HGB 12.6 (L) 09/18/2019   HCT 38.0 (L) 09/18/2019   MCV 93.8 09/18/2019   PLT 111 (L) 09/18/2019      Chemistry      Component Value Date/Time   NA 139 09/18/2019 1106   K 4.0 09/18/2019 1106   CL 110 09/18/2019 1106   CO2 23 09/18/2019 1106   BUN 17 09/18/2019 1106   CREATININE 1.33 (H) 09/18/2019 1106      Component Value Date/Time   CALCIUM 9.6 09/18/2019 1106   ALKPHOS 61 09/18/2019 1106   AST 23 09/18/2019 1106   ALT 14 09/18/2019 1106   BILITOT 0.4 09/18/2019 1106       RADIOGRAPHIC STUDIES: CT Chest W Contrast  Result Date:  09/18/2019 CLINICAL DATA:  Non-small cell lung cancer staging EXAM: CT CHEST WITH CONTRAST TECHNIQUE: Multidetector CT imaging of the chest was performed during intravenous contrast administration. CONTRAST:  24mL OMNIPAQUE IOHEXOL 300 MG/ML  SOLN COMPARISON:  06/19/2019, 12/19/2018 FINDINGS: Cardiovascular: Aortic atherosclerosis. Normal heart size. Three-vessel coronary artery calcifications. No pericardial effusion. Mediastinum/Nodes: Numerous calcified, enlarged mediastinal and hilar lymph nodes. There are additional noncalcified pretracheal lymph nodes, unchanged compared to prior examination, measuring up to 2.6 x 1.7 cm (series 2, image 38). Thyroid gland, trachea, and esophagus demonstrate no significant findings. Lungs/Pleura: Redemonstrated postoperative findings of right middle lobectomy and right upper lobe wedge resection. Severe centrilobular emphysema with large bullae or pneumatocele at the right greater than left lung bases. Unchanged biapical pleuroparenchymal scarring. Continued interval enlargement of a subpleural nodule of the dependent right lower lobe, measuring 1.3 x 0.8 cm, previously 0.8 x 0.6 cm when measured similarly (series 7, image 51). Stable 4 mm nodule of the anterior left upper lobe (series 7, image 55). No pleural effusion or pneumothorax. Upper Abdomen: No acute abnormality. Mild intra and extrahepatic biliary ductal dilatation, unchanged compared to prior. Hepatic steatosis. Numerous subcentimeter low-attenuation lesions of the liver, unchanged and incompletely characterized although likely subcentimeter cysts. Musculoskeletal: No chest wall mass or suspicious  bone lesions identified. IMPRESSION: 1. Continued interval enlargement of a subpleural nodule of the dependent right lower lobe, measuring 1.3 x 0.8 cm, previously 0.8 x 0.6 cm when measured similarly. This is concerning for metastatic or metachronous malignancy. 2. Stable 4 mm nodule of the anterior left upper lobe.  Attention on follow-up. 3. Redemonstrated postoperative findings of right middle lobectomy and right upper lobe wedge resection. 4. Numerous calcified, enlarged mediastinal and hilar lymph nodes, with additional noncalcified pretracheal lymph nodes, unchanged compared to prior examination, measuring up to 2.6 x 1.7 cm. Attention on follow-up. 5. Emphysema (ICD10-J43.9). 6. Coronary artery disease.  Aortic Atherosclerosis (ICD10-I70.0). Electronically Signed   By: Eddie Candle M.D.   On: 09/18/2019 15:54    ASSESSMENT AND PLAN: This is a very pleasant 84 years old African-American male recently diagnosed with stage IIB (T3, N0, M0) invasive well-differentiated squamous cell carcinoma presented with right middle lobe lung mass status post right middle lobectomy with lymph node sampling on September 19, 2017 under the care of Dr. Roxan Hockey. The patient underwent adjuvant treatment with systemic chemotherapy with carboplatin for AUC of 6 and paclitaxel 200 mg/M2 every 3 weeks with Neulasta support status post 3 cycles. He tolerated this treatment well except for the chemotherapy-induced peripheral neuropathy and he discontinued his treatment after cycle #3. The patient is currently on observation and he is feeling fine with no concerning complaints. He had repeat CT scan of the chest performed recently.  I personally and independently reviewed the scan and showed the images to the patient and his wife.  His scan showed further increase in the previously identified subpleural nodule in the right lower lobe.  This is suspicious for metastatic or metachronous malignancy. I recommended for the patient to have a PET scan for further evaluation of this nodule. I will see him back for follow-up visit in 2 weeks for evaluation and discussion of the PET scan results and further recommendation regarding his condition. He was advised to call immediately if he has any concerning symptoms in the interval. The patient voices  understanding of current disease status and treatment options and is in agreement with the current care plan.  All questions were answered. The patient knows to call the clinic with any problems, questions or concerns. We can certainly see the patient much sooner if necessary.  Disclaimer: This note was dictated with voice recognition software. Similar sounding words can inadvertently be transcribed and may not be corrected upon review.

## 2019-09-26 ENCOUNTER — Encounter (HOSPITAL_BASED_OUTPATIENT_CLINIC_OR_DEPARTMENT_OTHER): Payer: Medicare Other | Admitting: Internal Medicine

## 2019-09-27 ENCOUNTER — Encounter (HOSPITAL_COMMUNITY)
Admission: RE | Admit: 2019-09-27 | Discharge: 2019-09-27 | Disposition: A | Discharge status: Home / Self Care | Payer: Medicare Other | Source: Ambulatory Visit | Attending: Internal Medicine | Admitting: Internal Medicine

## 2019-09-27 ENCOUNTER — Other Ambulatory Visit: Payer: Self-pay

## 2019-09-27 ENCOUNTER — Telehealth: Payer: Self-pay | Admitting: Internal Medicine

## 2019-09-27 DIAGNOSIS — I714 Abdominal aortic aneurysm, without rupture: Secondary | ICD-10-CM | POA: Insufficient documentation

## 2019-09-27 DIAGNOSIS — C3490 Malignant neoplasm of unspecified part of unspecified bronchus or lung: Secondary | ICD-10-CM | POA: Insufficient documentation

## 2019-09-27 DIAGNOSIS — I719 Aortic aneurysm of unspecified site, without rupture: Secondary | ICD-10-CM | POA: Diagnosis not present

## 2019-09-27 LAB — GLUCOSE, CAPILLARY: Glucose-Capillary: 94 mg/dL (ref 70–99)

## 2019-09-27 MED ORDER — FLUDEOXYGLUCOSE F - 18 (FDG) INJECTION
8.6500 | Freq: Once | INTRAVENOUS | Status: AC | PRN
  Administered 2019-09-27: 8.65 via INTRAVENOUS

## 2019-09-27 NOTE — Telephone Encounter (Signed)
Scheduled per los. Called and spoke with patient wife. Confirmed appt

## 2019-09-29 ENCOUNTER — Other Ambulatory Visit: Payer: Self-pay | Admitting: Internal Medicine

## 2019-10-01 NOTE — Telephone Encounter (Signed)
Refill request

## 2019-10-04 ENCOUNTER — Inpatient Hospital Stay: Payer: Medicare Other | Admitting: Internal Medicine

## 2019-10-04 ENCOUNTER — Other Ambulatory Visit: Payer: Self-pay

## 2019-10-04 ENCOUNTER — Encounter: Payer: Self-pay | Admitting: Internal Medicine

## 2019-10-04 VITALS — BP 126/78 | HR 73 | Temp 97.8°F | Resp 20 | Ht 68.0 in | Wt 174.6 lb

## 2019-10-04 DIAGNOSIS — C3491 Malignant neoplasm of unspecified part of right bronchus or lung: Secondary | ICD-10-CM | POA: Diagnosis not present

## 2019-10-04 DIAGNOSIS — I10 Essential (primary) hypertension: Secondary | ICD-10-CM | POA: Diagnosis not present

## 2019-10-04 DIAGNOSIS — C342 Malignant neoplasm of middle lobe, bronchus or lung: Secondary | ICD-10-CM | POA: Diagnosis not present

## 2019-10-04 DIAGNOSIS — C3490 Malignant neoplasm of unspecified part of unspecified bronchus or lung: Secondary | ICD-10-CM

## 2019-10-04 NOTE — Progress Notes (Signed)
[place] [phone]   [phone]  OFFICE PROGRESS NOTE  [name], PA-C [place]  [place] [address]  DIAGNOSIS: Questionable recurrent lung cancer initially diagnosed as stage IIB (T3, N0, M0) non-small cell lung cancer, invasive well-differentiated squamous cell carcinoma presented with right middle lobe lung mass.  PRIOR THERAPY:  1) Status post right VATS, right middle lobectomy with mediastinal lymph node sampling under the care of Dr. Roxan Hockey on 09/19/2017.  The tumor measured 4.2 cm but the carcinoma extends through the visceral pleura. 2) Adjuvant systemic chemotherapy with carboplatin for AUC of 6 and paclitaxel 200 mg/M2 every 3 weeks.  First dose 11/16/2017.  Status post 3 cycles.  Last dose was giving January 05, 2018 discontinued secondary to intolerance with significant peripheral neuropathy.  CURRENT THERAPY: Observation.  INTERVAL HISTORY: Danny Duke 84 y.o. male returns to the clinic today for follow-up visit accompanied by his wife.  The patient is feeling fine today with no concerning complaints except for mild fatigue.  He denied having any current chest pain, shortness of breath, cough or hemoptysis.  He denied having any fever or chills.  He has no nausea, vomiting, diarrhea or constipation.  He denied having any headache or visual changes.  He was found on previous CT scan of the chest to have suspicious disease recurrence in the chest.  The patient had a PET scan performed recently and he is here for evaluation and discussion of his discuss results.  MEDICAL HISTORY: Past Medical History:  Diagnosis Date  . Chronic back pain   . Hypercholesterolemia   . Hypertension   . lung ca dx'd 09/2017   possible lung cancer    ALLERGIES:  has No Known Allergies.  MEDICATIONS:  Current Outpatient Medications  Medication Sig Dispense Refill  . amLODipine (NORVASC) 10 MG tablet Take 10 mg by mouth  daily.    Marland Kitchen aspirin EC 81 MG tablet Take 81 mg by mouth daily.    . chlorproMAZINE (THORAZINE) 25 MG tablet Take 1 tablet (25 mg total) by mouth 3 (three) times daily as needed. 30 tablet 0  . DULoxetine (CYMBALTA) 60 MG capsule TAKE 1 CAPSULE(60 MG) BY MOUTH DAILY 30 capsule 3  . gabapentin (NEURONTIN) 300 MG capsule TAKE 3 CAPSULES(900 MG) BY MOUTH THREE TIMES DAILY 120 capsule 6  . lidocaine-prilocaine (EMLA) cream Apply 1 application topically as needed. 30 g 0  . lisinopril (PRINIVIL,ZESTRIL) 40 MG tablet Take 40 mg by mouth daily.  1  . losartan (COZAAR) 25 MG tablet TK 1 T PO D    . metoprolol succinate (TOPROL-XL) 25 MG 24 hr tablet Take 1 tablet (25 mg total) by mouth daily. 60 tablet 1  . Oxycodone HCl 20 MG TABS Take 1 tablet by mouth every 6 (six) hours as needed.  0  . simvastatin (ZOCOR) 20 MG tablet Take 20 mg by mouth daily.    . tadalafil (CIALIS) 5 MG tablet Take 5 mg by mouth daily.  12   No current facility-administered medications for this visit.    SURGICAL HISTORY:  Past Surgical History:  Procedure Laterality Date  . BRONCHIAL NEEDLE ASPIRATION BIOPSY  08/05/2017   Procedure: BRONCHIAL NEEDLE ASPIRATION BIOPSIES;  Surgeon: Juanito Doom, MD;  Location: [facility];  Service: Cardiopulmonary;;  . ENDOBRONCHIAL ULTRASOUND Bilateral 08/05/2017   Procedure: ENDOBRONCHIAL ULTRASOUND;  Surgeon: Juanito Doom, MD;  Location: [facility];  Service: Cardiopulmonary;  Laterality: Bilateral;  . IR  IMAGING GUIDED PORT INSERTION  12/27/2017  . MEDIASTINOSCOPY N/A 09/19/2017   Procedure: MEDIASTINOSCOPY;  Surgeon: [name], MD;  Location: [facility] OR;  Service: Thoracic;  Laterality: N/A;  . MULTIPLE TOOTH EXTRACTIONS    . VIDEO ASSISTED THORACOSCOPY (VATS)/ LOBECTOMY Right 09/19/2017   Procedure: RIGHT VIDEO ASSISTED THORACOSCOPY (VATS)/ LOBECTOMY;  Surgeon: [name], MD;  Location: [facility];  Service: Thoracic;  Laterality: Right;    REVIEW OF  SYSTEMS:  Constitutional: positive for fatigue Eyes: negative Ears, nose, mouth, throat, and face: negative Respiratory: negative Cardiovascular: negative Gastrointestinal: negative Genitourinary:negative Integument/breast: negative Hematologic/lymphatic: negative Musculoskeletal:negative Neurological: negative Behavioral/Psych: negative Endocrine: negative Allergic/Immunologic: negative   PHYSICAL EXAMINATION: General appearance: alert, cooperative, fatigued and no distress Head: Normocephalic, without obvious abnormality, atraumatic Neck: no adenopathy, no JVD, supple, symmetrical, trachea midline and thyroid not enlarged, symmetric, no tenderness/mass/nodules Lymph nodes: Cervical, supraclavicular, and axillary nodes normal. Resp: clear to auscultation bilaterally Back: symmetric, no curvature. ROM normal. No CVA tenderness. Cardio: regular rate and rhythm, S1, S2 normal, no murmur, click, rub or gallop GI: soft, non-tender; bowel sounds normal; no masses,  no organomegaly Extremities: extremities normal, atraumatic, no cyanosis or edema Neurologic: Alert and oriented X 3, normal strength and tone. Normal symmetric reflexes. Normal coordination and gait  ECOG PERFORMANCE STATUS: 1 - Symptomatic but completely ambulatory  Blood pressure 126/78, pulse 73, temperature 97.8 F (36.6 C), temperature source Temporal, resp. rate 20, height 5\' 8"  (1.727 m), weight 174 lb 9.6 oz (79.2 kg), SpO2 99 %.  LABORATORY DATA: Lab Results  Component Value Date   WBC 5.5 09/18/2019   HGB 12.6 (L) 09/18/2019   HCT 38.0 (L) 09/18/2019   MCV 93.8 09/18/2019   PLT 111 (L) 09/18/2019      Chemistry      Component Value Date/Time   NA 139 09/18/2019 1106   K 4.0 09/18/2019 1106   CL 110 09/18/2019 1106   CO2 23 09/18/2019 1106   BUN 17 09/18/2019 1106   CREATININE 1.33 (H) 09/18/2019 1106      Component Value Date/Time   CALCIUM 9.6 09/18/2019 1106   ALKPHOS 61 09/18/2019 1106   AST  23 09/18/2019 1106   ALT 14 09/18/2019 1106   BILITOT 0.4 09/18/2019 1106       RADIOGRAPHIC STUDIES: CT Chest W Contrast  Result Date: 09/18/2019 CLINICAL DATA:  Non-small cell lung cancer staging EXAM: CT CHEST WITH CONTRAST TECHNIQUE: Multidetector CT imaging of the chest was performed during intravenous contrast administration. CONTRAST:  73mL OMNIPAQUE IOHEXOL 300 MG/ML  SOLN COMPARISON:  06/19/2019, 12/19/2018 FINDINGS: Cardiovascular: Aortic atherosclerosis. Normal heart size. Three-vessel coronary artery calcifications. No pericardial effusion. Mediastinum/Nodes: Numerous calcified, enlarged mediastinal and hilar lymph nodes. There are additional noncalcified pretracheal lymph nodes, unchanged compared to prior examination, measuring up to 2.6 x 1.7 cm (series 2, image 38). Thyroid gland, trachea, and esophagus demonstrate no significant findings. Lungs/Pleura: Redemonstrated postoperative findings of right middle lobectomy and right upper lobe wedge resection. Severe centrilobular emphysema with large bullae or pneumatocele at the right greater than left lung bases. Unchanged biapical pleuroparenchymal scarring. Continued interval enlargement of a subpleural nodule of the dependent right lower lobe, measuring 1.3 x 0.8 cm, previously 0.8 x 0.6 cm when measured similarly (series 7, image 51). Stable 4 mm nodule of the anterior left upper lobe (series 7, image 55). No pleural effusion or pneumothorax. Upper Abdomen: No acute abnormality. Mild intra and extrahepatic biliary ductal dilatation, unchanged compared to prior. Hepatic steatosis. Numerous  subcentimeter low-attenuation lesions of the liver, unchanged and incompletely characterized although likely subcentimeter cysts. Musculoskeletal: No chest wall mass or suspicious bone lesions identified. IMPRESSION: 1. Continued interval enlargement of a subpleural nodule of the dependent right lower lobe, measuring 1.3 x 0.8 cm, previously 0.8 x 0.6 cm  when measured similarly. This is concerning for metastatic or metachronous malignancy. 2. Stable 4 mm nodule of the anterior left upper lobe. Attention on follow-up. 3. Redemonstrated postoperative findings of right middle lobectomy and right upper lobe wedge resection. 4. Numerous calcified, enlarged mediastinal and hilar lymph nodes, with additional noncalcified pretracheal lymph nodes, unchanged compared to prior examination, measuring up to 2.6 x 1.7 cm. Attention on follow-up. 5. Emphysema (ICD10-J43.9). 6. Coronary artery disease.  Aortic Atherosclerosis (ICD10-I70.0). Electronically Signed   By: Eddie Candle M.D.   On: 09/18/2019 15:54   NM PET Image Restag (PS) Skull Base To Thigh  Result Date: 09/27/2019 CLINICAL DATA:  Subsequent treatment strategy for non-small cell lung cancer. EXAM: NUCLEAR MEDICINE PET SKULL BASE TO THIGH TECHNIQUE: 8.65 mCi F-18 FDG was injected intravenously. Full-ring PET imaging was performed from the skull base to thigh after the radiotracer. CT data was obtained and used for attenuation correction and anatomic localization. Fasting blood glucose: 94 mg/dl COMPARISON:  CT chest 09/18/2019, previous PET evaluation from July 05, 2017 FINDINGS: Mediastinal blood pool activity: SUV max 2.66 Liver activity: SUV max NA NECK: RIGHT level 4 lymph node with hypermetabolic features (image 49, series 4) not seen previously (SUVmax = 5.3) No additional hypermetabolic lymph nodes in the neck. Incidental CT findings: none CHEST: Increased metabolic activity associated with the high RIGHT paratracheal lymph node that despite this increase shows no interval change in size when compared to the previous study, (SUVmax = 7.0) previously 4.3 the appearance of remaining lymph nodes in the chest, size and distribution is quite similar many of these areas display calcification none show an increase to the same extent as is outlined above with respect to the high RIGHT paratracheal node and the  RIGHT thoracic inletw node. Signs of pulmonary emphysema with biapical scarring and previous partial lung resection. 10 x 12 mm RIGHT lower lobe nodule displaying intense FDG uptake (SUVmax = 11.9) Incidental CT findings: Severe pulmonary emphysema. Airways are patent. Postoperative changes in the RIGHT chest as seen previously. Patulous esophagus. Calcified atheromatous plaque in the thoracic aorta and signs of coronary artery disease. RIGHT-sided Port-A-Cath terminates at the caval to atrial junction. ABDOMEN/PELVIS: Focal FDG uptake in the region of the gastric cardia. Gastric under distension is nonspecific and there is some mild increased activity in the distal esophagus is well. (SUVmax = 5.6.) No abnormal solid organ uptake. Increased FDG uptake in a portacaval lymph node (image 112, series 4) approximately 9 mm (SUVmax = 7.1 previously 5.0 Incidental CT findings: Infrarenal abdominal aortic aneurysm measuring 4.0 x 3.8 cm previously 3.5 x 3.4 cm in 2019 bilateral renal cysts. No hydronephrosis. Gastric cardia with thickening and increased FDG uptake as described. No acute gastrointestinal process otherwise. Bilateral hydroceles. Aortic atherosclerosis with aneurysm discussed above. SKELETON: No focal hypermetabolic activity to suggest skeletal metastasis. Incidental CT findings: Spinal degenerative changes. No acute or destructive bone process. IMPRESSION: 1. New pulmonary nodule in the posterior RIGHT chest along the pleura with marked hypermetabolic features suspicious for disease recurrence. 2. New RIGHT thoracic inlet lymph node raising the question of metastatic involvement, significance uncertain given the presence of numerous hypermetabolic lymph nodes in the mediastinum and hila that have been  described previously. Of note the RIGHT paratracheal lymph node without calcification while stable since prior exams as increased in terms of its metabolic activity. It may be reasonable to consider biopsy of  the RIGHT thoracic inlet lymph node given above findings. 3. Gastric thickening with moderate increased metabolic activity which is more focal, within the area of the gastric cardia, potentially related to gastritis but given focal appearance and other findings endoscopic assessment may be warranted. Would consider results of tissue obtained from other areas before proceeding as clinically warranted. 4. Slight increase in metabolic activity associated with a portacaval lymph node as discussed. Again initial less invasive assessments with biopsy of RIGHT thoracic inlet lymph node could potentially guide further evaluation. 5. Postoperative changes in the RIGHT chest as before. 6. Infrarenal abdominal aortic aneurysm, enlarged by approximately 0.5 cm in 2 years time. Recommend followup by ultrasound in 1 year. This recommendation follows ACR consensus guidelines: White Paper of the ACR Incidental Findings Committee II on Vascular Findings. J Am Coll Radiol 2013; 10:789-794. Aortic aneurysm NOS (ICD10-I71.9) Electronically Signed   By: Zetta Bills M.D.   On: 09/27/2019 17:42    ASSESSMENT AND PLAN: This is a very pleasant 84 years old African-American male with likely recurrent lung cancer initially diagnosed as stage IIB (T3, N0, M0) invasive well-differentiated squamous cell carcinoma presented with right middle lobe lung mass status post right middle lobectomy with lymph node sampling on September 19, 2017 under the care of Dr. Roxan Hockey. The patient underwent adjuvant treatment with systemic chemotherapy with carboplatin for AUC of 6 and paclitaxel 200 mg/M2 every 3 weeks with Neulasta support status post 3 cycles. He tolerated this treatment well except for the chemotherapy-induced peripheral neuropathy and he discontinued his treatment after cycle #3. A recent CT scan of the chest showed further increase in the previously identified subpleural nodule in the right lower lobe.  This is suspicious for  metastatic or metachronous malignancy.  The patient had a PET scan that showed hypermetabolic activity and a new pulmonary nodule in the posterior right chest along the pleura suspicious for disease recurrence.  There was also new right thoracic inlet lymph node raising the question of metastatic involvement as well as numerous hypermetabolic lymph nodes in the mediastinum and hila. I personally and independently reviewed the scan results and showed the images to the patient and his wife. I recommended for the patient to see Dr. Roxan Hockey for consideration of bronchoscopy and biopsy for confirmation of the disease recurrence before considering him for treatment which is likely will be a course of concurrent chemoradiation if the patient has no other metastatic disease in the brain. I will complete the staging work-up by ordering MRI of the brain to rule out brain metastasis. I will see the patient back for follow-up visit in 2 weeks for evaluation and more detailed discussion of his treatment options based on the final pathology and staging work-up. He was advised to call immediately if he has any concerning symptoms in the interval. The patient voices understanding of current disease status and treatment options and is in agreement with the current care plan.  All questions were answered. The patient knows to call the clinic with any problems, questions or concerns. We can certainly see the patient much sooner if necessary.  Disclaimer: This note was dictated with voice recognition software. Similar sounding words can inadvertently be transcribed and may not be corrected upon review.

## 2019-10-08 ENCOUNTER — Telehealth: Payer: Self-pay | Admitting: *Deleted

## 2019-10-08 NOTE — Telephone Encounter (Signed)
I called to check on Danny Duke today.  I updated on appt for the patient to see Dr. Roxan Hockey this week at the cancer center. They are aware and will be here Thursday.

## 2019-10-09 ENCOUNTER — Telehealth: Payer: Self-pay | Admitting: Internal Medicine

## 2019-10-09 NOTE — Telephone Encounter (Signed)
Scheduled per los. Called and spoke with patients wife. Confirmed appt

## 2019-10-11 ENCOUNTER — Institutional Professional Consult (permissible substitution) (INDEPENDENT_AMBULATORY_CARE_PROVIDER_SITE_OTHER): Payer: Medicare Other | Admitting: Thoracic Surgery (Cardiothoracic Vascular Surgery)

## 2019-10-11 ENCOUNTER — Other Ambulatory Visit: Payer: Self-pay

## 2019-10-11 ENCOUNTER — Other Ambulatory Visit: Payer: Self-pay | Admitting: *Deleted

## 2019-10-11 VITALS — BP 122/79 | HR 71 | Temp 97.7°F | Resp 17 | Wt 175.6 lb

## 2019-10-11 DIAGNOSIS — R911 Solitary pulmonary nodule: Secondary | ICD-10-CM

## 2019-10-11 DIAGNOSIS — Z85118 Personal history of other malignant neoplasm of bronchus and lung: Secondary | ICD-10-CM

## 2019-10-11 NOTE — Progress Notes (Signed)
[address]       [place] [address]             [phone]      HPI: Danny Duke presents for evaluation for possible recurrent lung cancer  Danny Duke is an 84 yo man with a history of tobacco abuse(quit 1999), emphysema, lung cancer, mediastinal adenopathy, hypertension, hyperlipidemia and chronic back pain. He was found to have a right middle lobe mass and mediastinal adenopathy in 2019. 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 Aspirations of the lymph nodes were negative. I did a mediastinoscopy and the nodes were again negative. I then did a right middle lobectomy. There was invasion of the parietal pleura and the resection was done extrapleural. He had T3N0 stage IIIA disease. He was treated with adjuvant chemo but only completed 3 of 4 cycles.  He recently had a follow up CT which showed an enlarging right lung nodule. On PET the nodule was hypermetabolic. The mediastinal nodes were still hypermetabolic.   He feels well. Denies CP, unusual shortness of breath, change in appetite, weight loss.   Past Medical History:  Diagnosis Date  . Chronic back pain   . Hypercholesterolemia   . Hypertension   . lung ca dx'd 09/2017   possible lung cancer    Current Outpatient Medications  Medication Sig Dispense Refill  . amLODipine (NORVASC) 10 MG tablet Take 10 mg by mouth daily.    Marland Kitchen aspirin EC 81 MG tablet Take 81 mg by mouth daily.    . chlorproMAZINE (THORAZINE) 25 MG tablet Take 1 tablet (25 mg total) by mouth 3 (three) times daily as needed. 30 tablet 0  . DULoxetine (CYMBALTA) 60 MG capsule TAKE 1 CAPSULE(60 MG) BY MOUTH DAILY 30 capsule 3  . gabapentin (NEURONTIN) 300 MG capsule TAKE 3 CAPSULES(900 MG) BY MOUTH THREE TIMES DAILY 120 capsule 6  . lidocaine-prilocaine (EMLA) cream Apply 1 application topically as needed. 30 g 0  . lisinopril (PRINIVIL,ZESTRIL) 40 MG tablet Take 40 mg by mouth daily.  1  . losartan (COZAAR) 25 MG  tablet TK 1 T PO D    . metoprolol succinate (TOPROL-XL) 25 MG 24 hr tablet Take 1 tablet (25 mg total) by mouth daily. 60 tablet 1  . Oxycodone HCl 20 MG TABS Take 1 tablet by mouth every 6 (six) hours as needed.  0  . simvastatin (ZOCOR) 20 MG tablet Take 20 mg by mouth daily.    . tadalafil (CIALIS) 5 MG tablet Take 5 mg by mouth daily.  12   No current facility-administered medications for this visit.    Physical Exam Constitutional:      General: He is not in acute distress.    Appearance: Normal appearance.  HENT:     Head: Normocephalic and atraumatic.  Eyes:     General: No scleral icterus.    Extraocular Movements: Extraocular movements intact.  Neck:     Comments: IJ catheter Cardiovascular:     Rate and Rhythm: Normal rate and regular rhythm.     Heart sounds: Normal heart sounds. No murmur heard.   Pulmonary:     Effort: Pulmonary effort is normal. No respiratory distress.     Breath sounds: No wheezing.  Abdominal:     General: There is no distension.     Palpations: Abdomen is soft.     Tenderness: There is no abdominal tenderness.  Musculoskeletal:  General: No swelling.  Lymphadenopathy:     Cervical: No cervical adenopathy.  Skin:    General: Skin is warm and dry.  Neurological:     General: No focal deficit present.     Mental Status: He is alert and oriented to person, place, and time.     Cranial Nerves: No cranial nerve deficit.     Motor: No weakness.     Diagnostic Tests: NUCLEAR MEDICINE PET SKULL BASE TO THIGH  TECHNIQUE: 8.65 mCi F-18 FDG was injected intravenously. Full-ring PET imaging was performed from the skull base to thigh after the radiotracer. CT data was obtained and used for attenuation correction and anatomic localization.  Fasting blood glucose: 94 mg/dl  COMPARISON:  CT chest 09/18/2019, previous PET evaluation from July 05, 2017  FINDINGS: Mediastinal blood pool activity: SUV max 2.66  Liver activity:  SUV max NA  NECK: RIGHT level 4 lymph node with hypermetabolic features (image 49, series 4) not seen previously (SUVmax = 5.3)  No additional hypermetabolic lymph nodes in the neck.  Incidental CT findings: none  CHEST: Increased metabolic activity associated with the high RIGHT paratracheal lymph node that despite this increase shows no interval change in size when compared to the previous study, (SUVmax = 7.0) previously 4.3 the appearance of remaining lymph nodes in the chest, size and distribution is quite similar many of these areas display calcification none show an increase to the same extent as is outlined above with respect to the high RIGHT paratracheal node and the RIGHT thoracic inletw node.  Signs of pulmonary emphysema with biapical scarring and previous partial lung resection.  10 x 12 mm RIGHT lower lobe nodule displaying intense FDG uptake (SUVmax = 11.9)  Incidental CT findings: Severe pulmonary emphysema. Airways are patent. Postoperative changes in the RIGHT chest as seen previously. Patulous esophagus.  Calcified atheromatous plaque in the thoracic aorta and signs of coronary artery disease. RIGHT-sided Port-A-Cath terminates at the caval to atrial junction.  ABDOMEN/PELVIS: Focal FDG uptake in the region of the gastric cardia. Gastric under distension is nonspecific and there is some mild increased activity in the distal esophagus is well. (SUVmax = 5.6.) No abnormal solid organ uptake.  Increased FDG uptake in a portacaval lymph node (image 112, series 4) approximately 9 mm (SUVmax = 7.1 previously 5.0  Incidental CT findings: Infrarenal abdominal aortic aneurysm measuring 4.0 x 3.8 cm previously 3.5 x 3.4 cm in 2019 bilateral renal cysts. No hydronephrosis. Gastric cardia with thickening and increased FDG uptake as described. No acute gastrointestinal process otherwise. Bilateral hydroceles. Aortic atherosclerosis with aneurysm  discussed above.  SKELETON: No focal hypermetabolic activity to suggest skeletal metastasis.  Incidental CT findings: Spinal degenerative changes. No acute or destructive bone process.  IMPRESSION: 1. New pulmonary nodule in the posterior RIGHT chest along the pleura with marked hypermetabolic features suspicious for disease recurrence. 2. New RIGHT thoracic inlet lymph node raising the question of metastatic involvement, significance uncertain given the presence of numerous hypermetabolic lymph nodes in the mediastinum and hila that have been described previously. Of note the RIGHT paratracheal lymph node without calcification while stable since prior exams as increased in terms of its metabolic activity. It may be reasonable to consider biopsy of the RIGHT thoracic inlet lymph node given above findings. 3. Gastric thickening with moderate increased metabolic activity which is more focal, within the area of the gastric cardia, potentially related to gastritis but given focal appearance and other findings endoscopic assessment may be warranted. Would  consider results of tissue obtained from other areas before proceeding as clinically warranted. 4. Slight increase in metabolic activity associated with a portacaval lymph node as discussed. Again initial less invasive assessments with biopsy of RIGHT thoracic inlet lymph node could potentially guide further evaluation. 5. Postoperative changes in the RIGHT chest as before. 6. Infrarenal abdominal aortic aneurysm, enlarged by approximately 0.5 cm in 2 years time. Recommend followup by ultrasound in 1 year. This recommendation follows ACR consensus guidelines: White Paper of the ACR Incidental Findings Committee II on Vascular Findings. J Am Coll Radiol 2013; 10:789-794. Aortic aneurysm NOS (ICD10-I71.9)   Electronically Signed   By: Zetta Bills M.D.   On: 09/27/2019 17:42 I personally reviewed the CT and PET images and  concur with the findings noted above.  Impression: Danny Duke is an 84 yo man with a history of tobacco abuse(quit 1999), emphysema, lung cancer, mediastinal adenopathy, hypertension, hyperlipidemia and chronic back pain. He had a right middle lobectomy in 2019 for a stage IIIA (T3,N0) squamous cell carcinoma followed by adjuvant chemotherapy. He has been onder observation since then. He now has an enlarging right lung nodule, distant from his previous resection.   Differential diagnosis includes lung cancer recurrence, second primary lung cancer as well as infectious and inflammatory nodules. He needs a tissue diagnosis to guide therapy.  He has hypermetabolic lymph nodes in the mediastinum but it is essentially unchanged going back to 2019 when biopsies of 23 nodes were negative for tumor. All the nodes were encased with an inflammatory fibrotic reaction but were otherwise unremarkable. I don't think additional sampling of the nodes is warranted at this time. The lng nodule would be difficult to biopsy bronchoscopically. Therefore, I favor having IR do a CT guided biopsy.  Plan: Ct guided needle biopsy of right lung nodule  I spent 20 minutes today in review of records, images and in consultation with Mr. Warnell. Plan discussed at our multidisciplinary conference.   [name], MD [facility] [phone]

## 2019-10-15 ENCOUNTER — Ambulatory Visit (HOSPITAL_COMMUNITY)
Admission: RE | Admit: 2019-10-15 | Discharge: 2019-10-15 | Disposition: A | Discharge status: Home / Self Care | Payer: Medicare Other | Source: Ambulatory Visit | Attending: Internal Medicine | Admitting: Internal Medicine

## 2019-10-15 ENCOUNTER — Encounter (HOSPITAL_COMMUNITY): Payer: Self-pay | Admitting: Radiology

## 2019-10-15 ENCOUNTER — Other Ambulatory Visit: Payer: Self-pay

## 2019-10-15 DIAGNOSIS — C3490 Malignant neoplasm of unspecified part of unspecified bronchus or lung: Secondary | ICD-10-CM | POA: Diagnosis present

## 2019-10-15 MED ORDER — GADOBUTROL 1 MMOL/ML IV SOLN
8.0000 mL | Freq: Once | INTRAVENOUS | Status: AC | PRN
  Administered 2019-10-15: 8 mL via INTRAVENOUS

## 2019-10-15 NOTE — Progress Notes (Signed)
Frances Nickels. Grime Male, 84 y.o., 1935/07/27 MRN:  [id number] Phone:  [phone] Jerilynn Mages) PCP:  [name], PA-C Coverage:  [place] [facility] ([facility]) 10/15/2019 at 10:00 AM  RE: CT Biopsy Received: 2 days ago Markus Daft, MD  Arlyn Leak for CT guided biopsy of right posterior pleural based nodule.   [place]       Previous Messages   ----- Message -----  From: Garth Bigness D  Sent: 10/12/2019  8:42 AM EDT  To: [facility] Procedure Requests  Subject: CT Biopsy                     Procedure: CT Biopsy   Reason: Lung nodule, H/O: lung cancer, Right lung nodule, recurrent lung cancer?   History: CT, NM PET in computer   Provider: [place] Charon C   Provider Contact: [phone]

## 2019-10-17 ENCOUNTER — Telehealth: Payer: Self-pay | Admitting: Medical Oncology

## 2019-10-17 NOTE — Telephone Encounter (Signed)
Dr Julien Nordmann cancelled pt appt for tomorrow ; he wants pt to get bx on monday first . Wife notified. Will r/s for next week or the following Monday.

## 2019-10-18 ENCOUNTER — Inpatient Hospital Stay: Payer: Medicare Other | Admitting: Internal Medicine

## 2019-10-19 ENCOUNTER — Ambulatory Visit (HOSPITAL_COMMUNITY)
Admission: RE | Admit: 2019-10-19 | Discharge: 2019-10-19 | Disposition: A | Discharge status: Home / Self Care | Payer: Medicare Other | Source: Ambulatory Visit | Attending: Thoracic Surgery (Cardiothoracic Vascular Surgery) | Admitting: Thoracic Surgery (Cardiothoracic Vascular Surgery)

## 2019-10-19 ENCOUNTER — Other Ambulatory Visit: Payer: Self-pay | Admitting: Student

## 2019-10-19 DIAGNOSIS — Z01812 Encounter for preprocedural laboratory examination: Secondary | ICD-10-CM | POA: Diagnosis not present

## 2019-10-19 DIAGNOSIS — Z20822 Contact with and (suspected) exposure to covid-19: Secondary | ICD-10-CM | POA: Insufficient documentation

## 2019-10-19 LAB — SARS CORONAVIRUS 2 (TAT 6-24 HRS): SARS Coronavirus 2: NEGATIVE

## 2019-10-22 ENCOUNTER — Telehealth: Payer: Self-pay | Admitting: Internal Medicine

## 2019-10-22 ENCOUNTER — Ambulatory Visit (HOSPITAL_COMMUNITY)
Admission: RE | Admit: 2019-10-22 | Discharge: 2019-10-22 | Disposition: A | Discharge status: Home / Self Care | Payer: Medicare Other | Source: Ambulatory Visit | Attending: Thoracic Surgery (Cardiothoracic Vascular Surgery) | Admitting: Thoracic Surgery (Cardiothoracic Vascular Surgery)

## 2019-10-22 ENCOUNTER — Other Ambulatory Visit: Payer: Self-pay

## 2019-10-22 ENCOUNTER — Ambulatory Visit (HOSPITAL_COMMUNITY)
Admission: RE | Admit: 2019-10-22 | Discharge: 2019-10-22 | Disposition: A | Discharge status: Home / Self Care | Payer: Medicare Other | Source: Ambulatory Visit | Attending: Interventional Radiology | Admitting: Interventional Radiology

## 2019-10-22 ENCOUNTER — Encounter: Payer: Self-pay | Admitting: *Deleted

## 2019-10-22 DIAGNOSIS — E7800 Pure hypercholesterolemia, unspecified: Secondary | ICD-10-CM | POA: Diagnosis not present

## 2019-10-22 DIAGNOSIS — J439 Emphysema, unspecified: Secondary | ICD-10-CM | POA: Insufficient documentation

## 2019-10-22 DIAGNOSIS — Z79899 Other long term (current) drug therapy: Secondary | ICD-10-CM | POA: Insufficient documentation

## 2019-10-22 DIAGNOSIS — Z7901 Long term (current) use of anticoagulants: Secondary | ICD-10-CM | POA: Diagnosis not present

## 2019-10-22 DIAGNOSIS — I10 Essential (primary) hypertension: Secondary | ICD-10-CM | POA: Insufficient documentation

## 2019-10-22 DIAGNOSIS — Z20822 Contact with and (suspected) exposure to covid-19: Secondary | ICD-10-CM | POA: Insufficient documentation

## 2019-10-22 DIAGNOSIS — Z87891 Personal history of nicotine dependence: Secondary | ICD-10-CM | POA: Insufficient documentation

## 2019-10-22 DIAGNOSIS — Z9221 Personal history of antineoplastic chemotherapy: Secondary | ICD-10-CM | POA: Diagnosis not present

## 2019-10-22 DIAGNOSIS — Z85118 Personal history of other malignant neoplasm of bronchus and lung: Secondary | ICD-10-CM | POA: Insufficient documentation

## 2019-10-22 DIAGNOSIS — Z9889 Other specified postprocedural states: Secondary | ICD-10-CM

## 2019-10-22 DIAGNOSIS — E785 Hyperlipidemia, unspecified: Secondary | ICD-10-CM | POA: Insufficient documentation

## 2019-10-22 DIAGNOSIS — R911 Solitary pulmonary nodule: Secondary | ICD-10-CM

## 2019-10-22 DIAGNOSIS — C3490 Malignant neoplasm of unspecified part of unspecified bronchus or lung: Secondary | ICD-10-CM | POA: Diagnosis present

## 2019-10-22 DIAGNOSIS — Z7982 Long term (current) use of aspirin: Secondary | ICD-10-CM | POA: Diagnosis not present

## 2019-10-22 LAB — CBC
HCT: 41.9 % (ref 39.0–52.0)
Hemoglobin: 13.7 g/dL (ref 13.0–17.0)
MCH: 31.8 pg (ref 26.0–34.0)
MCHC: 32.7 g/dL (ref 30.0–36.0)
MCV: 97.2 fL (ref 80.0–100.0)
Platelets: 117 10*3/uL — ABNORMAL LOW (ref 150–400)
RBC: 4.31 MIL/uL (ref 4.22–5.81)
RDW: 13.7 % (ref 11.5–15.5)
WBC: 5 10*3/uL (ref 4.0–10.5)
nRBC: 0 % (ref 0.0–0.2)

## 2019-10-22 LAB — PROTIME-INR
INR: 1.1 (ref 0.8–1.2)
Prothrombin Time: 13.7 seconds (ref 11.4–15.2)

## 2019-10-22 MED ORDER — MIDAZOLAM HCL 2 MG/2ML IJ SOLN
INTRAMUSCULAR | Status: AC | PRN
  Administered 2019-10-22: 1 mg via INTRAVENOUS

## 2019-10-22 MED ORDER — LIDOCAINE HCL 1 % IJ SOLN
INTRAMUSCULAR | Status: AC
  Filled 2019-10-22: qty 20

## 2019-10-22 MED ORDER — MIDAZOLAM HCL 2 MG/2ML IJ SOLN
INTRAMUSCULAR | Status: AC
  Filled 2019-10-22: qty 2

## 2019-10-22 MED ORDER — FENTANYL CITRATE (PF) 100 MCG/2ML IJ SOLN
INTRAMUSCULAR | Status: AC | PRN
  Administered 2019-10-22: 25 ug via INTRAVENOUS

## 2019-10-22 MED ORDER — SODIUM CHLORIDE 0.9 % IV SOLN: INTRAVENOUS | Status: DC

## 2019-10-22 MED ORDER — FENTANYL CITRATE (PF) 100 MCG/2ML IJ SOLN
INTRAMUSCULAR | Status: AC
  Filled 2019-10-22: qty 2

## 2019-10-22 NOTE — Discharge Instructions (Signed)
Lung Biopsy, Care After This sheet gives you information about how to care for yourself after your procedure. Your health care provider may also give you more specific instructions depending on the type of biopsy you had. If you have problems or questions, contact your health care provider. What can I expect after the procedure? After the procedure, it is common to have:  A cough.  A sore throat.  Pain where a needle, bronchoscope, or incision was used to collect a biopsy sample (biopsy site). Follow these instructions at home: Medicines  Take over-the-counter and prescription medicines only as told by your health care provider.  Do not drink alcohol if your health care provider tells you not to drink.  Ask your health care provider if the medicine prescribed to you: ? Requires you to avoid driving or using heavy machinery. ? Can cause constipation. You may need to take these actions to prevent or treat constipation:  Drink enough fluid to keep your urine pale yellow.  Take over-the-counter or prescription medicines.  Eat foods that are high in fiber, such as beans, whole grains, and fresh fruits and vegetables.  Limit foods that are high in fat and processed sugars, such as fried or sweet foods.  Do not drive for 24 hours if you were given a sedative. Biopsy site care   Follow instructions from your health care provider about how to take care of your biopsy site. Make sure you: ? Wash your hands with soap and water before and after you change your bandage (dressing). If soap and water are not available, use hand sanitizer. ? Change your dressing as told by your health care provider. ? Leave stitches (sutures), skin glue, or adhesive strips in place. These skin closures may need to stay in place for 2 weeks or longer. If adhesive strip edges start to loosen and curl up, you may trim the loose edges. Do not remove adhesive strips completely unless your health care provider tells  you to do that.  Do not take baths, swim, or use a hot tub until your health care provider approves. Ask your health care provider if you may take showers. You may only be allowed to take sponge baths.  Check your biopsy site every day for signs of infection. Check for: ? Redness, swelling, or more pain. ? Fluid or blood. ? Warmth. ? Pus or a bad smell. General instructions  Return to your normal activities as told by your health care provider. Ask your health care provider what activities are safe for you.  It is up to you to get the results of your procedure. Ask your health care provider, or the department that is doing the procedure, when your results will be ready.  Keep all follow-up visits as told by your health care provider. This is important. Contact a health care provider if:  You have a fever.  You have redness, swelling, or more pain around your biopsy site.  You have fluid or blood coming from your biopsy site.  Your biopsy site feels warm to the touch.  You have pus or a bad smell coming from your biopsy site.  You have pain that does not get better with medicine. Get help right away if:  You cough up blood.  You have trouble breathing.  You have chest pain.  You lose consciousness. Summary  After the procedure, it is common to have a sore throat and a cough.  Return to your normal activities as told by your  health care provider. Ask your health care provider what activities are safe for you.  Take over-the-counter and prescription medicines only as told by your health care provider.  Report any unusual symptoms to your health care provider. This information is not intended to replace advice given to you by your health care provider. Make sure you discuss any questions you have with your health care provider. Document Revised: 03/29/2018 Document Reviewed: 03/23/2016 Elsevier Patient Education  [place].

## 2019-10-22 NOTE — Procedures (Signed)
Interventional Radiology Procedure Note  Procedure: CT guided biopsy of RLL pulmonary nodule Complications: No immediate EBL: 0 mL Recommendations: - Bedrest until CXR cleared.  Minimize talking, coughing or otherwise straining.  - Follow up 2 hr CXR pending   Signed,  Criselda Peaches, MD

## 2019-10-22 NOTE — Progress Notes (Signed)
I notified Dr. Julien Nordmann that Danny Duke is having his bx this week. Dr. Julien Nordmann states he would like to see him 1 week after bx.  I updated scheduling to call and schedule.

## 2019-10-22 NOTE — Telephone Encounter (Signed)
Scheduled appt per 8/16 sch msg - pt wife is aware of appt.

## 2019-10-22 NOTE — H&P (Signed)
Chief Complaint: Patient was seen in consultation today for right posterior pleural based nodule biopsy.  Referring Physician(s): Cygnet C  Supervising Physician: Jacqulynn Cadet  Patient Status: [facility] - Out-pt  History of Present Illness: Danny Duke is a 84 y.o. male with a past medical history significant for chronic back pain, HTN, HLD, previous tobacco use and emphysema, lung cancer (2019, s/p resection, VATS and chemotherapy) who presents today for a right lung nodule biopsy. Danny Duke has been followed by cardiothoracic surgery (Dr. Roxan Hockey) as well as oncology (Dr. Julien Nordmann) since his original cancer diagnosis in 2019.  He underwent a PET scan on 7/22 for routine follow up which noted a new pulmonary nodule in the posterior right chest along the pleura with marked hypermetabolic features suspicious for disease recurrence as well as new right thoracic inlet lymph node raising the question of metastatic involvement. IR has been asked to perform a percutaneous lung nodule biopsy to further direct care.  Danny Duke denies any complaints today, he has been feeling well overall. He understands that he is here to a get a lung biopsy today and is ready to proceed.  Past Medical History:  Diagnosis Date  . Chronic back pain   . Hypercholesterolemia   . Hypertension   . lung ca dx'd 09/2017   possible lung cancer    Past Surgical History:  Procedure Laterality Date  . BRONCHIAL NEEDLE ASPIRATION BIOPSY  08/05/2017   Procedure: BRONCHIAL NEEDLE ASPIRATION BIOPSIES;  Surgeon: Juanito Doom, MD;  Location: [facility];  Service: Cardiopulmonary;;  . ENDOBRONCHIAL ULTRASOUND Bilateral 08/05/2017   Procedure: ENDOBRONCHIAL ULTRASOUND;  Surgeon: Juanito Doom, MD;  Location: [facility];  Service: Cardiopulmonary;  Laterality: Bilateral;  . IR IMAGING GUIDED PORT INSERTION  12/27/2017  . MEDIASTINOSCOPY N/A 09/19/2017   Procedure: MEDIASTINOSCOPY;  Surgeon:  [name], MD;  Location: [facility] OR;  Service: Thoracic;  Laterality: N/A;  . MULTIPLE TOOTH EXTRACTIONS    . VIDEO ASSISTED THORACOSCOPY (VATS)/ LOBECTOMY Right 09/19/2017   Procedure: RIGHT VIDEO ASSISTED THORACOSCOPY (VATS)/ LOBECTOMY;  Surgeon: [name], MD;  Location: [place];  Service: Thoracic;  Laterality: Right;    Allergies: Patient has no known allergies.  Medications: Prior to Admission medications   Medication Sig Start Date End Date Taking? Authorizing Provider  acetaminophen (TYLENOL) 500 MG tablet Take 1,000 mg by mouth every 6 (six) hours as needed for moderate pain or headache.   Yes [provider]  amLODipine (NORVASC) 10 MG tablet Take 10 mg by mouth daily.   Yes [provider]  aspirin EC 81 MG tablet Take 81 mg by mouth daily.   Yes [provider]  DULoxetine (CYMBALTA) 60 MG capsule TAKE 1 CAPSULE(60 MG) BY MOUTH DAILY Patient taking differently: Take 60 mg by mouth daily.  10/01/19  Yes Vaslow, Acey Lav, MD  famotidine (PEPCID AC MAXIMUM STRENGTH) 20 MG tablet Take 20 mg by mouth daily as needed for indigestion.   Yes [provider]  gabapentin (NEURONTIN) 300 MG capsule TAKE 3 CAPSULES(900 MG) BY MOUTH THREE TIMES DAILY Patient taking differently: Take 900 mg by mouth 3 (three) times daily.  06/14/19  Yes Vaslow, Acey Lav, MD  losartan (COZAAR) 25 MG tablet Take 25 mg by mouth daily.  11/26/18  Yes [provider]  meclizine (ANTIVERT) 25 MG tablet Take 25 mg by mouth 3 (three) times daily as needed for dizziness. 05/28/19  Yes [provider]  metoprolol succinate (TOPROL-XL) 25 MG  24 hr tablet Take 1 tablet (25 mg total) by mouth daily. 09/26/17  Yes Gold, Wayne E, PA-C  Oxycodone HCl 20 MG TABS Take 20 mg by mouth every 6 (six) hours as needed (pain).  10/31/17  Yes [provider]  simvastatin (ZOCOR) 20 MG tablet Take 20 mg by mouth daily.   Yes [provider]  Vitamin D,  Ergocalciferol, (DRISDOL) 1.25 MG (50000 UNIT) CAPS capsule Take 50,000 Units by mouth once a week. 10/14/19  Yes [provider]  chlorproMAZINE (THORAZINE) 25 MG tablet Take 1 tablet (25 mg total) by mouth 3 (three) times daily as needed. Patient not taking: Reported on 10/17/2019 12/01/17   Maryanna Shape, NP  lidocaine-prilocaine (EMLA) cream Apply 1 application topically as needed. Patient not taking: Reported on 10/17/2019 08/18/18   Curt Bears, MD     Family History  Problem Relation Age of Onset  . Colon cancer Brother   . Stomach cancer Brother     Social History   Socioeconomic History  . Marital status: Married    Spouse name: Not on file  . Number of children: Not on file  . Years of education: Not on file  . Highest education level: Not on file  Occupational History  . Not on file  Tobacco Use  . Smoking status: Former Smoker    Packs/day: 0.50    Years: 40.00    Pack years: 20.00    Quit date: 03/08/1997    Years since quitting: 22.6  . Smokeless tobacco: Never Used  Vaping Use  . Vaping Use: Never used  Substance and Sexual Activity  . Alcohol use: No  . Drug use: No  . Sexual activity: Not on file  Other Topics Concern  . Not on file  Social History Narrative  . Not on file   Social Determinants of Health   Financial Resource Strain:   . Difficulty of Paying Living Expenses:   Food Insecurity:   . Worried About Charity fundraiser in the Last Year:   . Arboriculturist in the Last Year:   Transportation Needs:   . Film/video editor (Medical):   Marland Kitchen Lack of Transportation (Non-Medical):   Physical Activity:   . Days of Exercise per Week:   . Minutes of Exercise per Session:   Stress:   . Feeling of Stress :   Social Connections:   . Frequency of Communication with Friends and Family:   . Frequency of Social Gatherings with Friends and Family:   . Attends Religious Services:   . Active Member of Clubs or Organizations:   .  Attends Archivist Meetings:   Marland Kitchen Marital Status:      Review of Systems: A 12 point ROS discussed and pertinent positives are indicated in the HPI above.  All other systems are negative.  Review of Systems  Constitutional: Negative for appetite change, chills and fever.  Respiratory: Negative for cough and shortness of breath.   Cardiovascular: Negative for chest pain.  Gastrointestinal: Negative for abdominal pain, blood in stool, diarrhea, nausea and vomiting.  Musculoskeletal: Negative for back pain.  Neurological: Negative for dizziness and headaches.    Vital Signs: BP 140/85   Pulse 68   Temp 97.9 F (36.6 C) (Oral)   Resp 17   Ht 5\' 8"  (1.727 m)   Wt 174 lb (78.9 kg)   SpO2 100%   BMI 26.46 kg/m   Physical Exam Vitals reviewed.  Constitutional:  General: He is not in acute distress. HENT:     Head: Normocephalic.     Mouth/Throat:     Mouth: Mucous membranes are moist.     Pharynx: Oropharynx is clear. No oropharyngeal exudate or posterior oropharyngeal erythema.  Cardiovascular:     Rate and Rhythm: Normal rate and regular rhythm.  Pulmonary:     Effort: Pulmonary effort is normal.     Breath sounds: Normal breath sounds.  Abdominal:     General: There is no distension.     Tenderness: There is no abdominal tenderness.  Skin:    General: Skin is warm and dry.  Neurological:     Mental Status: He is alert and oriented to person, place, and time.  Psychiatric:        Mood and Affect: Mood normal.        Behavior: Behavior normal.        Thought Content: Thought content normal.        Judgment: Judgment normal.      MD Evaluation Airway: WNL Heart: WNL Abdomen: WNL Chest/ Lungs: WNL ASA  Classification: 3 Mallampati/Airway Score: Two   Imaging: MR BRAIN W WO CONTRAST  Result Date: 10/15/2019 CLINICAL DATA:  Malignant neoplasm of unspecified part of unspecified bronchus or lung. Non-small cell lung cancer, staging. EXAM: MRI HEAD  WITHOUT AND WITH CONTRAST TECHNIQUE: Multiplanar, multiecho pulse sequences of the brain and surrounding structures were obtained without and with intravenous contrast. CONTRAST:  82mL GADAVIST GADOBUTROL 1 MMOL/ML IV SOLN COMPARISON:  PET-CT 09/27/2019. brain MRI 08/31/2017. FINDINGS: Brain: Stable generalized parenchymal atrophy. Stable advanced multifocal T2/FLAIR hyperintensity within the cerebral white matter which is nonspecific, but consistent chronic small vessel ischemic disease. Redemonstrated chronic lacunar infarcts within the deep gray nuclei and cerebellar hemispheres. To a lesser degree, chronic small vessel ischemic changes are also present within the pons. Stable prominence of the lateral and third ventricles which may be related to central predominant atrophy. No abnormal enhancement is demonstrated to suggest intracranial metastatic disease. There is no acute infarct. No evidence of intracranial mass. No chronic intracranial blood products. No extra-axial fluid collection. No midline shift. Vascular: Expected proximal arterial flow voids. Skull and upper cervical spine: No focal marrow lesion. Incompletely assessed upper cervical spondylosis. Sinuses/Orbits: Visualized orbits show no acute finding. Mild ethmoid sinus mucosal thickening. Small right mastoid effusion. IMPRESSION: No evidence of intracranial metastatic disease. Stable atrophy and advanced chronic small vessel ischemic changes as described. Stable prominence of the lateral and third ventricles which may be due to central predominant atrophy or a component of communicating/normal pressure hydrocephalus. Mild ethmoid sinus mucosal thickening. Small right mastoid effusion. Electronically Signed   By: Kellie Simmering DO   On: 10/15/2019 15:37   NM PET Image Restag (PS) Skull Base To Thigh  Result Date: 09/27/2019 CLINICAL DATA:  Subsequent treatment strategy for non-small cell lung cancer. EXAM: NUCLEAR MEDICINE PET SKULL BASE TO THIGH  TECHNIQUE: 8.65 mCi F-18 FDG was injected intravenously. Full-ring PET imaging was performed from the skull base to thigh after the radiotracer. CT data was obtained and used for attenuation correction and anatomic localization. Fasting blood glucose: 94 mg/dl COMPARISON:  CT chest 09/18/2019, previous PET evaluation from July 05, 2017 FINDINGS: Mediastinal blood pool activity: SUV max 2.66 Liver activity: SUV max NA NECK: RIGHT level 4 lymph node with hypermetabolic features (image 49, series 4) not seen previously (SUVmax = 5.3) No additional hypermetabolic lymph nodes in the neck. Incidental CT findings: none CHEST:  Increased metabolic activity associated with the high RIGHT paratracheal lymph node that despite this increase shows no interval change in size when compared to the previous study, (SUVmax = 7.0) previously 4.3 the appearance of remaining lymph nodes in the chest, size and distribution is quite similar many of these areas display calcification none show an increase to the same extent as is outlined above with respect to the high RIGHT paratracheal node and the RIGHT thoracic inletw node. Signs of pulmonary emphysema with biapical scarring and previous partial lung resection. 10 x 12 mm RIGHT lower lobe nodule displaying intense FDG uptake (SUVmax = 11.9) Incidental CT findings: Severe pulmonary emphysema. Airways are patent. Postoperative changes in the RIGHT chest as seen previously. Patulous esophagus. Calcified atheromatous plaque in the thoracic aorta and signs of coronary artery disease. RIGHT-sided Port-A-Cath terminates at the caval to atrial junction. ABDOMEN/PELVIS: Focal FDG uptake in the region of the gastric cardia. Gastric under distension is nonspecific and there is some mild increased activity in the distal esophagus is well. (SUVmax = 5.6.) No abnormal solid organ uptake. Increased FDG uptake in a portacaval lymph node (image 112, series 4) approximately 9 mm (SUVmax = 7.1 previously  5.0 Incidental CT findings: Infrarenal abdominal aortic aneurysm measuring 4.0 x 3.8 cm previously 3.5 x 3.4 cm in 2019 bilateral renal cysts. No hydronephrosis. Gastric cardia with thickening and increased FDG uptake as described. No acute gastrointestinal process otherwise. Bilateral hydroceles. Aortic atherosclerosis with aneurysm discussed above. SKELETON: No focal hypermetabolic activity to suggest skeletal metastasis. Incidental CT findings: Spinal degenerative changes. No acute or destructive bone process. IMPRESSION: 1. New pulmonary nodule in the posterior RIGHT chest along the pleura with marked hypermetabolic features suspicious for disease recurrence. 2. New RIGHT thoracic inlet lymph node raising the question of metastatic involvement, significance uncertain given the presence of numerous hypermetabolic lymph nodes in the mediastinum and hila that have been described previously. Of note the RIGHT paratracheal lymph node without calcification while stable since prior exams as increased in terms of its metabolic activity. It may be reasonable to consider biopsy of the RIGHT thoracic inlet lymph node given above findings. 3. Gastric thickening with moderate increased metabolic activity which is more focal, within the area of the gastric cardia, potentially related to gastritis but given focal appearance and other findings endoscopic assessment may be warranted. Would consider results of tissue obtained from other areas before proceeding as clinically warranted. 4. Slight increase in metabolic activity associated with a portacaval lymph node as discussed. Again initial less invasive assessments with biopsy of RIGHT thoracic inlet lymph node could potentially guide further evaluation. 5. Postoperative changes in the RIGHT chest as before. 6. Infrarenal abdominal aortic aneurysm, enlarged by approximately 0.5 cm in 2 years time. Recommend followup by ultrasound in 1 year. This recommendation follows ACR  consensus guidelines: White Paper of the ACR Incidental Findings Committee II on Vascular Findings. J Am Coll Radiol 2013; 10:789-794. Aortic aneurysm NOS (ICD10-I71.9) Electronically Signed   By: Zetta Bills M.D.   On: 09/27/2019 17:42    Labs:  CBC: Recent Labs    12/15/18 0957 06/19/19 1135 09/18/19 1106  WBC 5.2 5.9 5.5  HGB 12.8* 12.9* 12.6*  HCT 39.3 39.5 38.0*  PLT 121* 135* 111*    COAGS: No results for input(s): INR, APTT in the last 8760 hours.  BMP: Recent Labs    12/15/18 0957 06/19/19 1135 09/18/19 1106  NA 140 142 139  K 4.1 4.0 4.0  CL 108 110  110  CO2 25 24 23   GLUCOSE 119* 99 113*  BUN 17 11 17   CALCIUM 9.2 9.2 9.6  CREATININE 1.33* 1.35* 1.33*  GFRNONAA 50* 49* 49*  GFRAA 58* 57* 57*    LIVER FUNCTION TESTS: Recent Labs    12/15/18 0957 06/19/19 1135 09/18/19 1106  BILITOT 0.3 0.5 0.4  AST 22 26 23   ALT 8 14 14   ALKPHOS 60 66 61  PROT 7.6 7.7 7.6  ALBUMIN 3.7 3.7 3.6    TUMOR MARKERS: No results for input(s): AFPTM, CEA, CA199, CHROMGRNA in the last 8760 hours.  Assessment and Plan:  84 y/o M with past medical history significant for previous lung cancer s/p right VATS, right middle lobectomy and chemotherapy (2019) now with new right lung nodule biopsy who presents today for a percutaneous lung biopsy.   Patient has been NPO since 8 pm yesterday, no current anticoagulation/antiplatelet medications. Afebrile, CBC pending, INR 1.1, COVID (-) 10/19/19.  Risks and benefits discussed with the patient including, but not limited to bleeding, hemoptysis, respiratory failure requiring intubation, infection, pneumothorax requiring chest tube placement, stroke from air embolism or even death.  All of the patient's questions were answered, patient is agreeable to proceed.  Consent signed and in chart.  Thank you for this interesting consult.  I greatly enjoyed meeting DOMANI BAKOS and look forward to participating in their care.  A copy  of this report was sent to the requesting provider on this date.  Electronically Signed: Joaquim Nam, PA-C 10/22/2019, 9:45 AM   I spent a total of 30 Minutes  in face to face in clinical consultation, greater than 50% of which was counseling/coordinating care for percutaneous lung biopsy.

## 2019-10-23 LAB — SURGICAL PATHOLOGY

## 2019-10-29 ENCOUNTER — Encounter: Payer: Self-pay | Admitting: Internal Medicine

## 2019-10-29 ENCOUNTER — Inpatient Hospital Stay: Payer: Medicare Other

## 2019-10-29 ENCOUNTER — Other Ambulatory Visit: Payer: Self-pay

## 2019-10-29 ENCOUNTER — Inpatient Hospital Stay: Payer: Medicare Other | Attending: Internal Medicine | Admitting: Internal Medicine

## 2019-10-29 VITALS — BP 128/79 | HR 77 | Temp 98.0°F | Resp 18 | Ht 68.0 in | Wt 172.9 lb

## 2019-10-29 DIAGNOSIS — C3431 Malignant neoplasm of lower lobe, right bronchus or lung: Secondary | ICD-10-CM | POA: Insufficient documentation

## 2019-10-29 DIAGNOSIS — G620 Drug-induced polyneuropathy: Secondary | ICD-10-CM | POA: Insufficient documentation

## 2019-10-29 DIAGNOSIS — E7800 Pure hypercholesterolemia, unspecified: Secondary | ICD-10-CM | POA: Insufficient documentation

## 2019-10-29 DIAGNOSIS — Z7982 Long term (current) use of aspirin: Secondary | ICD-10-CM | POA: Insufficient documentation

## 2019-10-29 DIAGNOSIS — I10 Essential (primary) hypertension: Secondary | ICD-10-CM | POA: Insufficient documentation

## 2019-10-29 DIAGNOSIS — C3490 Malignant neoplasm of unspecified part of unspecified bronchus or lung: Secondary | ICD-10-CM

## 2019-10-29 DIAGNOSIS — Z79899 Other long term (current) drug therapy: Secondary | ICD-10-CM | POA: Insufficient documentation

## 2019-10-29 DIAGNOSIS — C342 Malignant neoplasm of middle lobe, bronchus or lung: Secondary | ICD-10-CM | POA: Diagnosis present

## 2019-10-29 DIAGNOSIS — T451X5A Adverse effect of antineoplastic and immunosuppressive drugs, initial encounter: Secondary | ICD-10-CM | POA: Insufficient documentation

## 2019-10-29 DIAGNOSIS — C3491 Malignant neoplasm of unspecified part of right bronchus or lung: Secondary | ICD-10-CM

## 2019-10-29 DIAGNOSIS — Z7189 Other specified counseling: Secondary | ICD-10-CM

## 2019-10-29 DIAGNOSIS — C7801 Secondary malignant neoplasm of right lung: Secondary | ICD-10-CM | POA: Insufficient documentation

## 2019-10-29 NOTE — Progress Notes (Signed)
[place] [phone]   [phone]  OFFICE PROGRESS NOTE  [name], PA-C [address] [place]  [place] [address]  DIAGNOSIS: Recurrent non-small cell lung cancer, squamous cell carcinoma presented with right lower lobe lung nodule in August 2021 initially diagnosed as stage IIB (T3, N0, M0) non-small cell lung cancer, invasive well-differentiated squamous cell carcinoma presented with right middle lobe lung mass.  PRIOR THERAPY:  1) Status post right VATS, right middle lobectomy with mediastinal lymph node sampling under the care of Dr. Roxan Hockey on 09/19/2017.  The tumor measured 4.2 cm but the carcinoma extends through the visceral pleura. 2) Adjuvant systemic chemotherapy with carboplatin for AUC of 6 and paclitaxel 200 mg/M2 every 3 weeks.  First dose 11/16/2017.  Status post 3 cycles.  Last dose was giving January 05, 2018 discontinued secondary to intolerance with significant peripheral neuropathy.  CURRENT THERAPY: Observation.  INTERVAL HISTORY: Danny Duke 84 y.o. male returns to the clinic today for follow-up visit accompanied by his wife.  The patient is feeling fine today with no concerning complaints.  He denied having any chest pain, shortness of breath, cough or hemoptysis.  He has no nausea, vomiting, diarrhea or constipation.  He underwent MRI of the brain that showed no evidence for metastatic disease to the brain.  He also has CT-guided core biopsy of the right lower lobe lung nodule by interventional radiology and the final pathology was consistent with squamous cell carcinoma.  He is here today for evaluation and discussion of his treatment options.  MEDICAL HISTORY: Past Medical History:  Diagnosis Date  . Chronic back pain   . Hypercholesterolemia   . Hypertension   . lung ca dx'd 09/2017   possible lung cancer    ALLERGIES:  has No Known Allergies.  MEDICATIONS:  Current Outpatient Medications   Medication Sig Dispense Refill  . acetaminophen (TYLENOL) 500 MG tablet Take 1,000 mg by mouth every 6 (six) hours as needed for moderate pain or headache.    Marland Kitchen amLODipine (NORVASC) 10 MG tablet Take 10 mg by mouth daily.    Marland Kitchen aspirin EC 81 MG tablet Take 81 mg by mouth daily.    . chlorproMAZINE (THORAZINE) 25 MG tablet Take 1 tablet (25 mg total) by mouth 3 (three) times daily as needed. (Patient not taking: Reported on 10/17/2019) 30 tablet 0  . DULoxetine (CYMBALTA) 60 MG capsule TAKE 1 CAPSULE(60 MG) BY MOUTH DAILY (Patient taking differently: Take 60 mg by mouth daily. ) 30 capsule 3  . famotidine (PEPCID AC MAXIMUM STRENGTH) 20 MG tablet Take 20 mg by mouth daily as needed for indigestion.    . gabapentin (NEURONTIN) 300 MG capsule TAKE 3 CAPSULES(900 MG) BY MOUTH THREE TIMES DAILY (Patient taking differently: Take 900 mg by mouth 3 (three) times daily. ) 120 capsule 6  . lidocaine-prilocaine (EMLA) cream Apply 1 application topically as needed. (Patient not taking: Reported on 10/17/2019) 30 g 0  . losartan (COZAAR) 25 MG tablet Take 25 mg by mouth daily.     . meclizine (ANTIVERT) 25 MG tablet Take 25 mg by mouth 3 (three) times daily as needed for dizziness.    . metoprolol succinate (TOPROL-XL) 25 MG 24 hr tablet Take 1 tablet (25 mg total) by mouth daily. 60 tablet 1  . Oxycodone HCl 20 MG TABS Take 20 mg by mouth every 6 (six) hours as needed (pain).   0  . simvastatin (ZOCOR) 20 MG tablet Take  20 mg by mouth daily.    . Vitamin D, Ergocalciferol, (DRISDOL) 1.25 MG (50000 UNIT) CAPS capsule Take 50,000 Units by mouth once a week.     No current facility-administered medications for this visit.    SURGICAL HISTORY:  Past Surgical History:  Procedure Laterality Date  . BRONCHIAL NEEDLE ASPIRATION BIOPSY  08/05/2017   Procedure: BRONCHIAL NEEDLE ASPIRATION BIOPSIES;  Surgeon: Juanito Doom, MD;  Location: [facility];  Service: Cardiopulmonary;;  . ENDOBRONCHIAL ULTRASOUND  Bilateral 08/05/2017   Procedure: ENDOBRONCHIAL ULTRASOUND;  Surgeon: Juanito Doom, MD;  Location: [facility];  Service: Cardiopulmonary;  Laterality: Bilateral;  . IR IMAGING GUIDED PORT INSERTION  12/27/2017  . MEDIASTINOSCOPY N/A 09/19/2017   Procedure: MEDIASTINOSCOPY;  Surgeon: [name], MD;  Location: [facility] OR;  Service: Thoracic;  Laterality: N/A;  . MULTIPLE TOOTH EXTRACTIONS    . VIDEO ASSISTED THORACOSCOPY (VATS)/ LOBECTOMY Right 09/19/2017   Procedure: RIGHT VIDEO ASSISTED THORACOSCOPY (VATS)/ LOBECTOMY;  Surgeon: [name], MD;  Location: [facility];  Service: Thoracic;  Laterality: Right;    REVIEW OF SYSTEMS:  A comprehensive review of systems was negative.   PHYSICAL EXAMINATION: General appearance: alert, cooperative and no distress Head: Normocephalic, without obvious abnormality, atraumatic Neck: no adenopathy, no JVD, supple, symmetrical, trachea midline and thyroid not enlarged, symmetric, no tenderness/mass/nodules Lymph nodes: Cervical, supraclavicular, and axillary nodes normal. Resp: clear to auscultation bilaterally Back: symmetric, no curvature. ROM normal. No CVA tenderness. Cardio: regular rate and rhythm, S1, S2 normal, no murmur, click, rub or gallop GI: soft, non-tender; bowel sounds normal; no masses,  no organomegaly Extremities: extremities normal, atraumatic, no cyanosis or edema  ECOG PERFORMANCE STATUS: 1 - Symptomatic but completely ambulatory  Blood pressure 128/79, pulse 77, temperature 98 F (36.7 C), temperature source Tympanic, resp. rate 18, height 5\' 8"  (1.727 m), weight 172 lb 14.4 oz (78.4 kg), SpO2 99 %.  LABORATORY DATA: Lab Results  Component Value Date   WBC 5.0 10/22/2019   HGB 13.7 10/22/2019   HCT 41.9 10/22/2019   MCV 97.2 10/22/2019   PLT 117 (L) 10/22/2019      Chemistry      Component Value Date/Time   NA 139 09/18/2019 1106   K 4.0 09/18/2019 1106   CL 110 09/18/2019 1106   CO2 23 09/18/2019  1106   BUN 17 09/18/2019 1106   CREATININE 1.33 (H) 09/18/2019 1106      Component Value Date/Time   CALCIUM 9.6 09/18/2019 1106   ALKPHOS 61 09/18/2019 1106   AST 23 09/18/2019 1106   ALT 14 09/18/2019 1106   BILITOT 0.4 09/18/2019 1106       RADIOGRAPHIC STUDIES: MR BRAIN W WO CONTRAST  Result Date: 10/15/2019 CLINICAL DATA:  Malignant neoplasm of unspecified part of unspecified bronchus or lung. Non-small cell lung cancer, staging. EXAM: MRI HEAD WITHOUT AND WITH CONTRAST TECHNIQUE: Multiplanar, multiecho pulse sequences of the brain and surrounding structures were obtained without and with intravenous contrast. CONTRAST:  61mL GADAVIST GADOBUTROL 1 MMOL/ML IV SOLN COMPARISON:  PET-CT 09/27/2019. brain MRI 08/31/2017. FINDINGS: Brain: Stable generalized parenchymal atrophy. Stable advanced multifocal T2/FLAIR hyperintensity within the cerebral white matter which is nonspecific, but consistent chronic small vessel ischemic disease. Redemonstrated chronic lacunar infarcts within the deep gray nuclei and cerebellar hemispheres. To a lesser degree, chronic small vessel ischemic changes are also present within the pons. Stable prominence of the lateral and third ventricles which may be related to central predominant atrophy. No abnormal enhancement is  demonstrated to suggest intracranial metastatic disease. There is no acute infarct. No evidence of intracranial mass. No chronic intracranial blood products. No extra-axial fluid collection. No midline shift. Vascular: Expected proximal arterial flow voids. Skull and upper cervical spine: No focal marrow lesion. Incompletely assessed upper cervical spondylosis. Sinuses/Orbits: Visualized orbits show no acute finding. Mild ethmoid sinus mucosal thickening. Small right mastoid effusion. IMPRESSION: No evidence of intracranial metastatic disease. Stable atrophy and advanced chronic small vessel ischemic changes as described. Stable prominence of the lateral  and third ventricles which may be due to central predominant atrophy or a component of communicating/normal pressure hydrocephalus. Mild ethmoid sinus mucosal thickening. Small right mastoid effusion. Electronically Signed   By: Kellie Simmering DO   On: 10/15/2019 15:37   CT BIOPSY  Result Date: 10/22/2019 INDICATION: 84 year old male with a hypermetabolic pleural based nodule in the right lower lobe concerning for primary bronchogenic carcinoma. He presents for CT-guided biopsy of the same. EXAM: CT-guided biopsy of pulmonary nodule MEDICATIONS: None. ANESTHESIA/SEDATION: Moderate (conscious) sedation was employed during this procedure. A total of Versed 1 mg and Fentanyl 25 mcg was administered intravenously. Moderate Sedation Time: 14 minutes. The patient's level of consciousness and vital signs were monitored continuously by radiology nursing throughout the procedure under my direct supervision. FLUOROSCOPY TIME:  None COMPLICATIONS: None immediate. PROCEDURE: Informed written consent was obtained from the patient after a thorough discussion of the procedural risks, benefits and alternatives. All questions were addressed. A timeout was performed prior to the initiation of the procedure. Planning axial CT scan was performed. The subpleural pulmonary nodule in the right lower lobe was successfully identified. A suitable skin entry site was selected and marked. The overlying skin was sterilely prepped and draped in the standard fashion using chlorhexidine skin prep. Local anesthesia was attained by infiltration with 1% lidocaine. A small dermatotomy was made. Under intermittent CT guidance, a 17 gauge introducer needle was advanced and positioned at the margin of the mass. Multiple 18 gauge core biopsies were then coaxially obtained using the bio Pince automated biopsy device. Biopsy specimens were placed in formalin and delivered to pathology for further analysis. Post biopsy imaging demonstrates no evidence of  pneumothorax or other complication. The introducer needle was removed. The patient was transferred to short-stay in good condition. IMPRESSION: Successful CT-guided core biopsy of right lower lobe subpleural pulmonary nodule. Electronically Signed   By: Jacqulynn Cadet M.D.   On: 10/22/2019 12:13   DG Chest Port 1 View  Result Date: 10/22/2019 CLINICAL DATA:  Lung biopsy EXAM: PORTABLE CHEST 1 VIEW COMPARISON:  Portable exam 1245 hours compared to 10/03/2018 FINDINGS: RIGHT jugular Port-A-Cath with tip projecting over SVC near cavoatrial junction. Normal heart size and mediastinal contours. Volume loss in the RIGHT hemithorax with mediastinal shift to the RIGHT. Postoperative changes in RIGHT upper lobe with extensive RIGHT lung scarring and associated atelectasis. Underlying emphysematous changes and chronic LEFT apical scarring. No acute infiltrate, pleural effusion, or pneumothorax. IMPRESSION: Changes of COPD with postoperative changes of the RIGHT upper lobe and scattered lung scarring RIGHT greater than LEFT. No acute abnormalities. Electronically Signed   By: Lavonia Dana M.D.   On: 10/22/2019 12:59    ASSESSMENT AND PLAN: This is a very pleasant 84 years old African-American male with likely recurrent lung cancer initially diagnosed as stage IIB (T3, N0, M0) invasive well-differentiated squamous cell carcinoma presented with right middle lobe lung mass status post right middle lobectomy with lymph node sampling on September 19, 2017 under  the care of Dr. Roxan Hockey. The patient underwent adjuvant treatment with systemic chemotherapy with carboplatin for AUC of 6 and paclitaxel 200 mg/M2 every 3 weeks with Neulasta support status post 3 cycles. He tolerated this treatment well except for the chemotherapy-induced peripheral neuropathy and he discontinued his treatment after cycle #3. A recent CT scan of the chest showed further increase in the previously identified subpleural nodule in the right lower  lobe.  This is suspicious for metastatic or metachronous malignancy.  The patient had a PET scan that showed hypermetabolic activity and a new pulmonary nodule in the posterior right chest along the pleura suspicious for disease recurrence.  There was also new right thoracic inlet lymph node raising the question of metastatic involvement as well as numerous hypermetabolic lymph nodes in the mediastinum and hila. 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 He underwent CT-guided core biopsy of the right lower lobe lung nodule and the final pathology was consistent with squamous cell carcinoma. I recommended for the patient to see radiation oncology for consideration of SBRT to this nodule. I will continue to monitor him closely with repeat CT scan of the chest in 4 months. I also discussed with him the results of the MRI of the brain that was negative for malignancy. The patient was advised to call immediately if he has any concerning symptoms in the interval. The patient voices understanding of current disease status and treatment options and is in agreement with the current care plan.  All questions were answered. The patient knows to call the clinic with any problems, questions or concerns. We can certainly see the patient much sooner if necessary.  Disclaimer: This note was dictated with voice recognition software. Similar sounding words can inadvertently be transcribed and may not be corrected upon review.

## 2019-10-30 ENCOUNTER — Telehealth: Payer: Self-pay | Admitting: Internal Medicine

## 2019-10-30 NOTE — Telephone Encounter (Signed)
Scheduled per los. Called and spoke with patients wife. Confirmed appt

## 2019-11-01 ENCOUNTER — Inpatient Hospital Stay: Payer: Medicare Other

## 2019-11-01 ENCOUNTER — Other Ambulatory Visit: Payer: Self-pay | Admitting: *Deleted

## 2019-11-01 NOTE — Progress Notes (Signed)
The proposed treatment discussed in cancer conference is for discussion purpose only and is not a binding recommendation. The patient was not physically examined nor present for their treatment options. Therefore, final treatment plans cannot be decided.  ?

## 2019-11-23 ENCOUNTER — Ambulatory Visit
Admission: RE | Admit: 2019-11-23 | Discharge: 2019-11-23 | Disposition: A | Discharge status: Home / Self Care | Payer: Medicare Other | Source: Ambulatory Visit | Attending: Urology | Admitting: Urology

## 2019-11-23 ENCOUNTER — Ambulatory Visit: Payer: Medicare Other

## 2019-11-23 ENCOUNTER — Ambulatory Visit: Payer: Medicare Other | Admitting: Urology

## 2019-11-23 DIAGNOSIS — C3491 Malignant neoplasm of unspecified part of right bronchus or lung: Secondary | ICD-10-CM

## 2019-11-23 NOTE — Progress Notes (Signed)
Radiation Oncology         [phone] ________________________________  Outpatient Re-Consultation - Conducted via MyChart due to current COVID-19 concerns for limiting Danny Duke exposure  Name: Danny Duke MRN: [id number]  Date of Service: 11/23/2019 DOB: 1935/11/14  NW:[id number], Melissa, Duke  Curt Bears, Duke   REFERRING PHYSICIAN: Curt Bears, Duke  DIAGNOSIS: The encounter diagnosis was Squamous cell carcinoma lung, right ([place]).    ICD-10-CM   1. Squamous cell carcinoma lung, right (HCC)  C34.91     HISTORY OF PRESENT ILLNESS: Danny Duke is a 84 y.o. male seen at the request of Dr. Julien Nordmann. We initially met the Danny Duke during the multidisciplinary thoracic oncology clinic on 08/25/2017. In summary, he was found to have a RML opacity on routine chest CT during annual physical back in 2019. This was followed by a chest CT on 06/16/17 confirming a 3 cm RML opacity and by PET scan on 07/05/17 showing hypermetabolism to the now 3.4 cm RML mass, an adjacent 1.9 cm nodule, and multiple mediastinal and hilar lymph nodes. Biopsy performed on 07/29/17 confirmed moderately differentiated squamous cell carcinoma. Bronchoscopy and FNA of the mediastinal lymph nodes on 08/05/2017 showed negative nodes. The conference consensus was to proceed with surgical resection. He underwent right middle lobectomy with mediastinoscopy for mediastinal lymph node sampling on 09/19/17 under the care of Dr. Roxan Hockey. Final surgical pathology showed a 4.2 cm tumor with extension through the visceral pleura. He subsequently received 3 cycles of adjuvant chemotherapy with carboplatin and paclitaxel under the care of Dr. Julien Nordmann. This was discontinued after the third cycle secondary to significant peripheral neuropathy. He has been under observation with Dr. Julien Nordmann since that time.  More recently, on restaging chest CT on 06/19/19, a new 0.8 cm area of pleural nodularity was seen in the posterior right chest.  Re-evaluation on follow up chest CT on 09/18/19 showed continued enlargement of the subpleural nodule of the RLL, measuring 1.3 cm.  This prompted a PET scan on 09/27/2019 confirming a  markedly hypermetabolic pulmonary nodule in the posterior right chest along the pleura; a new right thoracic inlet lymph node and a previously-seen right paratracheal lymph node stable in size but with increased metabolic activity. He completed his staging workup with a brain MRI on 10/15/2019, which was negative for metastatic disease. His case was discussed in a recent [place] and consensus was to proceed with CT guided core biopsy of the peripheral RLL nodule but it was not felt necessary to re-sample the lymph nodes given previous negative node sampling. The lymph nodes will be monitored closely on follow up imaging.  He underwent CT guided core biopsy of the RLL nodule on 10/22/2019 with final surgical pathology confirming squamous cell carcinoma.  He has been kindly referred to [place] today for discussion of potential SBRT to the peripheral RLL nodule.  PREVIOUS RADIATION THERAPY: No  PAST MEDICAL HISTORY:  Past Medical History:  Diagnosis Date  . Chronic back pain   . Hypercholesterolemia   . Hypertension   . lung ca dx'd 09/2017   possible lung cancer      PAST SURGICAL HISTORY: Past Surgical History:  Procedure Laterality Date  . BRONCHIAL NEEDLE ASPIRATION BIOPSY  08/05/2017   Procedure: BRONCHIAL NEEDLE ASPIRATION BIOPSIES;  Surgeon: Juanito Doom, Duke;  Location: [facility];  Service: Cardiopulmonary;;  . ENDOBRONCHIAL ULTRASOUND Bilateral 08/05/2017   Procedure: ENDOBRONCHIAL ULTRASOUND;  Surgeon: Juanito Doom, Duke;  Location: [facility];  Service: Cardiopulmonary;  Laterality: Bilateral;  .  IR IMAGING GUIDED PORT INSERTION  12/27/2017  . MEDIASTINOSCOPY N/A 09/19/2017   Procedure: MEDIASTINOSCOPY;  Surgeon: [name], Duke;  Location[facility] OR;  Service: Thoracic;  Laterality: N/A;  .  MULTIPLE TOOTH EXTRACTIONS    . VIDEO ASSISTED THORACOSCOPY (VATS)/ LOBECTOMY Right 09/19/2017   Procedure: RIGHT VIDEO ASSISTED THORACOSCOPY (VATS)/ LOBECTOMY;  Surgeon: [name], Duke;  Location: [facility] OR;  Service: Thoracic;  Laterality: Right;    FAMILY HISTORY:  Family History  Problem Relation Age of Onset  . Colon cancer Brother   . Stomach cancer Brother     SOCIAL HISTORY:  Social History   Socioeconomic History  . Marital status: Married    Spouse name: Not on file  . Number of children: Not on file  . Years of education: Not on file  . Highest education level: Not on file  Occupational History  . Not on file  Tobacco Use  . Smoking status: Former Smoker    Packs/day: 0.50    Years: 40.00    Pack years: 20.00    Quit date: 03/08/1997    Years since quitting: 22.7  . Smokeless tobacco: Never Used  Vaping Use  . Vaping Use: Never used  Substance and Sexual Activity  . Alcohol use: No  . Drug use: No  . Sexual activity: Not on file  Other Topics Concern  . Not on file  Social History Narrative  . Not on file   Social Determinants of Health   Financial Resource Strain:   . Difficulty of Paying Living Expenses: Not on file  Food Insecurity:   . Worried About Charity fundraiser in the Last Year: Not on file  . Ran Out of Food in the Last Year: Not on file  Transportation Needs:   . Lack of Transportation (Medical): Not on file  . Lack of Transportation (Non-Medical): Not on file  Physical Activity:   . Days of Exercise per Week: Not on file  . Minutes of Exercise per Session: Not on file  Stress:   . Feeling of Stress : Not on file  Social Connections:   . Frequency of Communication with Friends and Family: Not on file  . Frequency of Social Gatherings with Friends and Family: Not on file  . Attends Religious Services: Not on file  . Active Member of Clubs or Organizations: Not on file  . Attends Archivist Meetings: Not on file  .  Marital Status: Not on file  Intimate Partner Violence:   . Fear of Current or Ex-Partner: Not on file  . Emotionally Abused: Not on file  . Physically Abused: Not on file  . Sexually Abused: Not on file    ALLERGIES: Danny Duke has no known allergies.  MEDICATIONS:  Current Outpatient Medications  Medication Sig Dispense Refill  . acetaminophen (TYLENOL) 500 MG tablet Take 1,000 mg by mouth every 6 (six) hours as needed for moderate pain or headache.    Marland Kitchen amLODipine (NORVASC) 10 MG tablet Take 10 mg by mouth daily.    Marland Kitchen aspirin EC 81 MG tablet Take 81 mg by mouth daily.    . chlorproMAZINE (THORAZINE) 25 MG tablet Take 1 tablet (25 mg total) by mouth 3 (three) times daily as needed. (Danny Duke not taking: Reported on 10/17/2019) 30 tablet 0  . DULoxetine (CYMBALTA) 60 MG capsule TAKE 1 CAPSULE(60 MG) BY MOUTH DAILY (Danny Duke taking differently: Take 60 mg by mouth daily. ) 30 capsule 3  . famotidine (PEPCID  AC MAXIMUM STRENGTH) 20 MG tablet Take 20 mg by mouth daily as needed for indigestion.    . gabapentin (NEURONTIN) 300 MG capsule TAKE 3 CAPSULES(900 MG) BY MOUTH THREE TIMES DAILY (Danny Duke taking differently: Take 900 mg by mouth 3 (three) times daily. ) 120 capsule 6  . lidocaine-prilocaine (EMLA) cream Apply 1 application topically as needed. (Danny Duke not taking: Reported on 10/17/2019) 30 g 0  . losartan (COZAAR) 25 MG tablet Take 25 mg by mouth daily.     . meclizine (ANTIVERT) 25 MG tablet Take 25 mg by mouth 3 (three) times daily as needed for dizziness.    . metoprolol succinate (TOPROL-XL) 25 MG 24 hr tablet Take 1 tablet (25 mg total) by mouth daily. 60 tablet 1  . Oxycodone HCl 20 MG TABS Take 20 mg by mouth every 6 (six) hours as needed (pain).   0  . simvastatin (ZOCOR) 20 MG tablet Take 20 mg by mouth daily.    . Vitamin D, Ergocalciferol, (DRISDOL) 1.25 MG (50000 UNIT) CAPS capsule Take 50,000 Units by mouth once a week.     No current facility-administered medications for this  encounter.    REVIEW OF SYSTEMS:  On review of systems, the Danny Duke reports that he is doing well overall. He denies any chest pain, increased shortness of breath, cough, hemoptysis, fevers, chills, night sweats, unintended weight changes. He denies any bowel or bladder disturbances, and denies abdominal pain, nausea or vomiting. He denies any new musculoskeletal or joint aches or pains. A complete review of systems is obtained and is otherwise negative.    PHYSICAL EXAM:  Wt Readings from Last 3 Encounters:  10/29/19 172 lb 14.4 oz (78.4 kg)  10/22/19 174 lb (78.9 kg)  10/11/19 175 lb 9.6 oz (79.7 kg)   Temp Readings from Last 3 Encounters:  10/29/19 98 F (36.7 C) (Tympanic)  10/22/19 97.9 F (36.6 C) (Oral)  10/11/19 97.7 F (36.5 C) (Oral)   BP Readings from Last 3 Encounters:  10/29/19 128/79  10/22/19 137/75  10/11/19 122/79   Pulse Readings from Last 3 Encounters:  10/29/19 77  10/22/19 68  10/11/19 71    /10  In general this is a well appearing African American man in no acute distress. He's alert and oriented x4 and appropriate throughout the examination. Cardiopulmonary assessment is negative for acute distress and he exhibits normal effort.    KPS = 90  100 - Normal; no complaints; no evidence of disease. 90   - Able to carry on normal activity; minor signs or symptoms of disease. 80   - Normal activity with effort; some signs or symptoms of disease. 9   - Cares for self; unable to carry on normal activity or to do active work. 60   - Requires occasional assistance, but is able to care for most of his personal needs. 50   - Requires considerable assistance and frequent medical care. 73   - Disabled; requires special care and assistance. 41   - Severely disabled; hospital admission is indicated although death not imminent. 51   - Very sick; hospital admission necessary; active supportive treatment necessary. 10   - Moribund; fatal processes progressing  rapidly. 0     - Dead  Karnofsky DA, Abelmann WH, Craver LS and Burchenal [facility] [phone]) The use of the nitrogen mustards in the palliative treatment of carcinoma: with particular reference to bronchogenic carcinoma Cancer 1 634-56  LABORATORY DATA:  Lab Results  Component Value Date  WBC 5.0 10/22/2019   HGB 13.7 10/22/2019   HCT 41.9 10/22/2019   MCV 97.2 10/22/2019   PLT 117 (L) 10/22/2019   Lab Results  Component Value Date   NA 139 09/18/2019   K 4.0 09/18/2019   CL 110 09/18/2019   CO2 23 09/18/2019   Lab Results  Component Value Date   ALT 14 09/18/2019   AST 23 09/18/2019   ALKPHOS 61 09/18/2019   BILITOT 0.4 09/18/2019     RADIOGRAPHY: No results found.    IMPRESSION/PLAN: This visit was conducted via MyChart to spare the Danny Duke unnecessary potential exposure in the healthcare setting during the current COVID-19 pandemic. 1. 85 y.o. gentleman with recurrent squamous cell carcinoma Today, we talked to the Danny Duke and family about the findings and workup thus far. We discussed the natural history of squamous cell carcinoma and general treatment, highlighting the role of radiation therapy in the management. We discussed the available radiation techniques, and focused on the details and logistics of delivery. The recommendation is to proceed with a 3-5 fraction course of stereotactic body radiation (SBRT) to the peripheral RLL nodule only and continue to monitor the lymph nodes on follow up imaging. We reviewed the anticipated acute and late sequelae associated with radiation in this setting. The Danny Duke was encouraged to ask questions that were answered to his satisfaction.  He appears to have a good understanding of his disease and our treatment recommendations which are of curative intent.  At the end of our conversation, the Danny Duke in interested in proceeding with the recommended course of SBRT to the peripheral RLL nodule. He is tentatively scheduled for CT simulation next  Friday, 11/30/2019, at 2:30 pm in anticipation of beginning his treatments in the near future. He prefers late afternoon appointments for treatment so he can continue to work. We will share our discussion with Dr. Julien Nordmann and move forward with treatment planning accordingly.   Given current concerns for Danny Duke exposure during the COVID-19 pandemic, this encounter was conducted via video-enabled WebEx visit. The Danny Duke has given verbal consent for this type of encounter. The time spent during this encounter was 45 minutes. The attendants for this meeting include Danny Duke, Danny Duke, Katie Daubenspeck- scribe, Danny Duke, Danny Duke. During the encounter, Danny Duke, Danny Duke, and scribe, Danny Duke were located at [place].  Danny Duke, Danny Duke and his Duke were located at home.    Danny Johns, Duke    Danny Pita, Duke  [facility] Direct Dial: [phone]  Fax: [phone] [email or web address]  Skype  [email or web address]   This document serves as a record of services personally performed by Danny Pita, Duke and Danny Caldron, Duke. It was created on their behalf by Danny Duke, a trained medical scribe. The creation of this record is based on the scribe's personal observations and the provider's statements to them. This document has been checked and approved by the attending provider.

## 2019-11-30 ENCOUNTER — Other Ambulatory Visit: Payer: Self-pay

## 2019-11-30 ENCOUNTER — Ambulatory Visit
Admission: RE | Admit: 2019-11-30 | Discharge: 2019-11-30 | Disposition: A | Discharge status: Home / Self Care | Payer: Medicare Other | Source: Ambulatory Visit | Attending: Radiation Oncology | Admitting: Radiation Oncology

## 2019-11-30 DIAGNOSIS — Z510 Encounter for antineoplastic radiation therapy: Secondary | ICD-10-CM | POA: Diagnosis not present

## 2019-11-30 DIAGNOSIS — C3491 Malignant neoplasm of unspecified part of right bronchus or lung: Secondary | ICD-10-CM

## 2019-11-30 DIAGNOSIS — C3431 Malignant neoplasm of lower lobe, right bronchus or lung: Secondary | ICD-10-CM | POA: Insufficient documentation

## 2019-11-30 NOTE — Progress Notes (Signed)
  Radiation Oncology         (336) [phone] ________________________________  Name: Danny Duke MRN: [id number]  Date: 11/30/2019  DOB: 1935/06/17  STEREOTACTIC BODY RADIOTHERAPY SIMULATION AND TREATMENT PLANNING NOTE    ICD-10-CM   1. Squamous cell carcinoma lung, right (HCC)  C34.91     DIAGNOSIS:  84 y.o. gentleman with recurrent squamous cell carcinoma of the right lower lung  NARRATIVE:  The patient was brought to the [place].  Identity was confirmed.  All relevant records and images related to the planned course of therapy were reviewed.  The patient freely provided informed written consent to proceed with treatment after reviewing the details related to the planned course of therapy. The consent form was witnessed and verified by the simulation staff.  Then, the patient was set-up in a stable reproducible  supine position for radiation therapy.  A BodyFix immobilization pillow was fabricated for reproducible positioning.  Then I personally applied the abdominal compression paddle to limit respiratory excursion.  4D respiratoy motion management CT images were obtained.  Surface markings were placed.  The CT images were loaded into the planning software.  Then, using Cine, MIP, and standard views, the internal target volume (ITV) and planning target volumes (PTV) were delinieated, and avoidance structures were contoured.  Treatment planning then occurred.  The radiation prescription was entered and confirmed.  A total of two complex treatment devices were fabricated in the form of the BodyFix immobilization pillow and a neck accuform cushion.  I have requested : 3D Simulation  I have requested a DVH of the following structures: Heart, Lungs, Esophagus, Chest Wall, Brachial Plexus, Major Blood Vessels, and targets.  SPECIAL TREATMENT PROCEDURE:  The planned course of therapy using radiation constitutes a special treatment procedure. Special care is required in the management  of this patient for the following reasons. This treatment constitutes a Special Treatment Procedure for the following reason: [ High dose per fraction requiring special monitoring for increased toxicities of treatment including daily imaging..  The special nature of the planned course of radiotherapy will require increased physician supervision and oversight to ensure patient's safety with optimal treatment outcomes.  RESPIRATORY MOTION MANAGEMENT SIMULATION:  In order to account for effect of respiratory motion on target structures and other organs in the planning and delivery of radiotherapy, this patient underwent respiratory motion management simulation.  To accomplish this, when the patient was brought to the CT simulation planning suite, 4D respiratoy motion management CT images were obtained.  The CT images were loaded into the planning software.  Then, using a variety of tools including Cine, MIP, and standard views, the target volume and planning target volumes (PTV) were delineated.  Avoidance structures were contoured.  Treatment planning then occurred.  Dose volume histograms were generated and reviewed for each of the requested structure.  The resulting plan was carefully reviewed and approved today.  PLAN:  The patient will receive 54 Gy in 3 fractions.  ________________________________  Sheral Apley Tammi Klippel, M.D.

## 2019-12-02 NOTE — Addendum Note (Signed)
Encounter addended by: Tyler Pita, MD on: 12/02/2019 7:09 PM  Actions taken: Medication List reviewed, Problem List reviewed, Allergies reviewed

## 2019-12-10 ENCOUNTER — Ambulatory Visit
Admission: RE | Admit: 2019-12-10 | Discharge: 2019-12-10 | Disposition: A | Discharge status: Home / Self Care | Payer: Medicare Other | Source: Ambulatory Visit | Attending: Radiation Oncology | Admitting: Radiation Oncology

## 2019-12-10 ENCOUNTER — Telehealth: Payer: Self-pay | Admitting: Medical Oncology

## 2019-12-10 DIAGNOSIS — Z510 Encounter for antineoplastic radiation therapy: Secondary | ICD-10-CM | POA: Diagnosis not present

## 2019-12-10 DIAGNOSIS — C3431 Malignant neoplasm of lower lobe, right bronchus or lung: Secondary | ICD-10-CM | POA: Diagnosis present

## 2019-12-10 NOTE — Telephone Encounter (Signed)
Please move flush appt  to oct 6 th since pt will already be here. Schedule message sent.

## 2019-12-11 ENCOUNTER — Ambulatory Visit: Payer: Medicare Other

## 2019-12-11 ENCOUNTER — Telehealth: Payer: Self-pay | Admitting: Medical Oncology

## 2019-12-11 NOTE — Telephone Encounter (Signed)
Flush appt corrected.

## 2019-12-12 ENCOUNTER — Other Ambulatory Visit: Payer: Self-pay

## 2019-12-12 ENCOUNTER — Ambulatory Visit
Admission: RE | Admit: 2019-12-12 | Discharge: 2019-12-12 | Disposition: A | Discharge status: Home / Self Care | Payer: Medicare Other | Source: Ambulatory Visit | Attending: Radiation Oncology | Admitting: Radiation Oncology

## 2019-12-12 ENCOUNTER — Inpatient Hospital Stay: Payer: Medicare Other | Attending: Internal Medicine

## 2019-12-12 DIAGNOSIS — C342 Malignant neoplasm of middle lobe, bronchus or lung: Secondary | ICD-10-CM | POA: Insufficient documentation

## 2019-12-12 DIAGNOSIS — C3431 Malignant neoplasm of lower lobe, right bronchus or lung: Secondary | ICD-10-CM | POA: Diagnosis not present

## 2019-12-12 DIAGNOSIS — Z452 Encounter for adjustment and management of vascular access device: Secondary | ICD-10-CM | POA: Insufficient documentation

## 2019-12-12 DIAGNOSIS — C3491 Malignant neoplasm of unspecified part of right bronchus or lung: Secondary | ICD-10-CM

## 2019-12-12 MED ORDER — SODIUM CHLORIDE 0.9% FLUSH
10.0000 mL | INTRAVENOUS | Status: DC | PRN
  Administered 2019-12-12: 10 mL
  Filled 2019-12-12: qty 10

## 2019-12-12 MED ORDER — HEPARIN SOD (PORK) LOCK FLUSH 100 UNIT/ML IV SOLN
500.0000 [IU] | Freq: Once | INTRAVENOUS | Status: AC | PRN
  Administered 2019-12-12: 500 [IU]
  Filled 2019-12-12: qty 5

## 2019-12-12 NOTE — Patient Instructions (Signed)

## 2019-12-13 ENCOUNTER — Ambulatory Visit: Payer: Medicare Other

## 2019-12-13 ENCOUNTER — Inpatient Hospital Stay: Payer: Medicare Other

## 2019-12-14 ENCOUNTER — Ambulatory Visit: Payer: Medicare Other

## 2019-12-17 ENCOUNTER — Ambulatory Visit
Admission: RE | Admit: 2019-12-17 | Discharge: 2019-12-17 | Disposition: A | Discharge status: Home / Self Care | Payer: Medicare Other | Source: Ambulatory Visit | Attending: Radiation Oncology | Admitting: Radiation Oncology

## 2019-12-17 DIAGNOSIS — C3431 Malignant neoplasm of lower lobe, right bronchus or lung: Secondary | ICD-10-CM | POA: Diagnosis not present

## 2019-12-18 ENCOUNTER — Ambulatory Visit: Payer: Medicare Other

## 2019-12-19 ENCOUNTER — Ambulatory Visit
Admission: RE | Admit: 2019-12-19 | Discharge: 2019-12-19 | Disposition: A | Discharge status: Home / Self Care | Payer: Medicare Other | Source: Ambulatory Visit | Attending: Radiation Oncology | Admitting: Radiation Oncology

## 2019-12-19 DIAGNOSIS — C3431 Malignant neoplasm of lower lobe, right bronchus or lung: Secondary | ICD-10-CM | POA: Diagnosis not present

## 2019-12-20 ENCOUNTER — Ambulatory Visit: Payer: Medicare Other

## 2019-12-21 ENCOUNTER — Ambulatory Visit
Admission: RE | Admit: 2019-12-21 | Discharge: 2019-12-21 | Disposition: A | Discharge status: Home / Self Care | Payer: Medicare Other | Source: Ambulatory Visit | Attending: Radiation Oncology | Admitting: Radiation Oncology

## 2019-12-21 ENCOUNTER — Encounter: Payer: Self-pay | Admitting: Urology

## 2019-12-21 DIAGNOSIS — C3431 Malignant neoplasm of lower lobe, right bronchus or lung: Secondary | ICD-10-CM | POA: Diagnosis not present

## 2020-01-09 ENCOUNTER — Other Ambulatory Visit: Payer: Self-pay | Admitting: Internal Medicine

## 2020-01-09 NOTE — Telephone Encounter (Signed)
Refill request

## 2020-01-24 ENCOUNTER — Other Ambulatory Visit: Payer: Self-pay

## 2020-01-24 ENCOUNTER — Inpatient Hospital Stay: Payer: Medicare Other | Attending: Internal Medicine

## 2020-01-24 ENCOUNTER — Encounter: Payer: Self-pay | Admitting: Urology

## 2020-01-24 ENCOUNTER — Telehealth: Payer: Self-pay | Admitting: Physician Assistant

## 2020-01-24 ENCOUNTER — Ambulatory Visit
Admission: RE | Admit: 2020-01-24 | Discharge: 2020-01-24 | Disposition: A | Discharge status: Home / Self Care | Payer: Medicare Other | Source: Ambulatory Visit | Attending: Urology | Admitting: Urology

## 2020-01-24 DIAGNOSIS — Z452 Encounter for adjustment and management of vascular access device: Secondary | ICD-10-CM | POA: Diagnosis not present

## 2020-01-24 DIAGNOSIS — C342 Malignant neoplasm of middle lobe, bronchus or lung: Secondary | ICD-10-CM | POA: Insufficient documentation

## 2020-01-24 DIAGNOSIS — C3491 Malignant neoplasm of unspecified part of right bronchus or lung: Secondary | ICD-10-CM

## 2020-01-24 MED ORDER — HEPARIN SOD (PORK) LOCK FLUSH 100 UNIT/ML IV SOLN
500.0000 [IU] | Freq: Once | INTRAVENOUS | Status: AC | PRN
  Administered 2020-01-24: 500 [IU]
  Filled 2020-01-24: qty 5

## 2020-01-24 MED ORDER — SODIUM CHLORIDE 0.9% FLUSH
10.0000 mL | INTRAVENOUS | Status: DC | PRN
  Administered 2020-01-24: 10 mL
  Filled 2020-01-24: qty 10

## 2020-01-24 NOTE — Telephone Encounter (Signed)
I received a message from radiation oncology that the patient's upcoming CT scan has not been scheduled. I called and spoke to the patient's wife. She was given the number to radiology scheduling and advised to call them and schedule the scan. Discussed that the target date of the scan is around 12/13-12/17 so that the results will be available to discussed at the follow up with Dr. Julien Nordmann on 12/20.

## 2020-01-24 NOTE — Progress Notes (Signed)
Radiation Oncology         [phone] ________________________________  Name: Danny Duke MRN: [id number]  Date: 01/24/2020  DOB: [birth year]  Post Treatment Note  CC: [name], Annamary Rummage, MD  Diagnosis:   84 y.o. gentleman with recurrent squamous cell carcinomaof the right lower lung   Interval Since Last Radiation:  5 weeks  SBRT// 12/10/19, 12/12/19, 12/17/19, 12/19/19, 12/21/19:  The RLL target was treated to 50 Gy in 5 fractions of 12 Gy  Narrative:  I spoke with the patient to conduct his routine scheduled 1 month follow up visit via telephone to spare the patient unnecessary potential exposure in the healthcare setting during the current COVID-19 pandemic.  The patient was notified in advance and gave permission to proceed with this visit format. He tolerated radiation treatment relatively well and was without any ill side effects.                              On review of systems, the patient states that he is doing very well in general and is currently without complaints.  He denies any increase in shortness of breath, any recent chest pain, hemoptysis or productive cough.  He has not had recent fever, chills, night sweats or unintentional weight loss.  He reports a healthy appetite and is maintaining his weight.  He denies any significant impact on his energy level and has remained active.  Overall, he is quite pleased with his progress to date.  ALLERGIES:  has No Known Allergies.  Meds: Current Outpatient Medications  Medication Sig Dispense Refill  . acetaminophen (TYLENOL) 500 MG tablet Take 1,000 mg by mouth every 6 (six) hours as needed for moderate pain or headache.    Marland Kitchen amLODipine (NORVASC) 10 MG tablet Take 10 mg by mouth daily.    Marland Kitchen aspirin EC 81 MG tablet Take 81 mg by mouth daily.    . DULoxetine (CYMBALTA) 60 MG capsule TAKE 1 CAPSULE(60 MG) BY MOUTH DAILY 30 capsule 3  . famotidine (PEPCID AC MAXIMUM STRENGTH) 20 MG tablet Take 20 mg  by mouth daily as needed for indigestion.    . gabapentin (NEURONTIN) 300 MG capsule TAKE 3 CAPSULES(900 MG) BY MOUTH THREE TIMES DAILY (Patient taking differently: Take 900 mg by mouth 3 (three) times daily. ) 120 capsule 6  . lidocaine-prilocaine (EMLA) cream Apply 1 application topically as needed. 30 g 0  . losartan (COZAAR) 25 MG tablet Take 25 mg by mouth daily.     . metoprolol succinate (TOPROL-XL) 25 MG 24 hr tablet Take 1 tablet (25 mg total) by mouth daily. 60 tablet 1  . Oxycodone HCl 20 MG TABS Take 20 mg by mouth every 6 (six) hours as needed (pain).   0  . simvastatin (ZOCOR) 20 MG tablet Take 20 mg by mouth daily.    . Vitamin D, Ergocalciferol, (DRISDOL) 1.25 MG (50000 UNIT) CAPS capsule Take 50,000 Units by mouth once a week.    . chlorproMAZINE (THORAZINE) 25 MG tablet Take 1 tablet (25 mg total) by mouth 3 (three) times daily as needed. (Patient not taking: Reported on 10/17/2019) 30 tablet 0  . meclizine (ANTIVERT) 25 MG tablet Take 25 mg by mouth 3 (three) times daily as needed for dizziness.     No current facility-administered medications for this encounter.   Facility-Administered Medications Ordered in Other Encounters  Medication Dose Route Frequency Provider Last Rate Last  Admin  . sodium chloride flush (NS) 0.9 % injection 10 mL  10 mL Intracatheter PRN Curt Bears, MD   10 mL at 01/24/20 1023    Physical Findings:  vitals were not taken for this visit.   Karen Kays to assess due to telephone follow up visit format.  Lab Findings: Lab Results  Component Value Date   WBC 5.0 10/22/2019   HGB 13.7 10/22/2019   HCT 41.9 10/22/2019   MCV 97.2 10/22/2019   PLT 117 (L) 10/22/2019     Radiographic Findings: No results found.  Impression/Plan: 1. 84 y.o. gentleman with recurrent squamous cell carcinomaof the right lower lung. This patient appears to have recovered well from the effects of his recent SBRT and is currently without complaints.  We discussed  that while we are happy to continue to participate in his care if clinically indicated in the future, at this point, we will plan to see him back on an as-needed basis.  He will continue in routine follow-up under the care of Dr. Earlie Server for continued systemic disease monitoring and management.  His next scheduled follow-up with Dr. Earlie Server is on 02/27/2020 and he will have a repeat CT chest prior to that visit.  We enjoyed participating in his care and look forward to continuing to follow his progress.  He knows that he is welcome to call at anytime with any questions or concerns related to his previous radiotherapy.    Nicholos Johns, PA-C

## 2020-01-24 NOTE — Progress Notes (Signed)
  Radiation Oncology         [phone]) [phone] ________________________________  Name: AYSEN SHIEH MRN: [id number]  Date: 12/21/2019  DOB: 05/16/1935  End of Treatment Note  Diagnosis:   84 y.o. gentleman with recurrent squamous cell carcinoma of the right lower lung     Indication for treatment:  Curative, Definitive SBRT x5      Radiation treatment dates:   12/10/19, 12/12/19, 12/17/19, 12/19/19, 12/21/19  Site/dose:   The target was treated to 50 Gy in 5 fractions of 12 Gy  Beams/energy:   The patient was treated using stereotactic body radiotherapy according to a 3D conformal radiotherapy plan.  Volumetric arc fields were employed to deliver 6 MV X-rays.  Image guidance was performed with per fraction cone beam CT prior to treatment under personal MD supervision.  Immobilization was achieved using BodyFix Pillow.  Narrative: The patient tolerated radiation treatment relatively well and was without any ill side effects.  Plan: The patient has completed radiation treatment. The patient will return to radiation oncology clinic for routine followup in one month. I advised them to call or return sooner if they have any questions or concerns related to their recovery or treatment. ________________________________  Sheral Apley. Tammi Klippel, M.D.

## 2020-01-24 NOTE — Addendum Note (Signed)
Encounter addended by: Sherrlyn Hock, RN on: 01/24/2020 2:26 PM  Actions taken: Order Reconciliation Section accessed, Medication List reviewed

## 2020-01-24 NOTE — Progress Notes (Signed)
Patients meaningful use is complete. Patient is aware that this is a phone visit. Patient denies any pain,sob, coughing,difficulty with swallowing or wt loss. Patient states that his appetite is good.

## 2020-02-25 ENCOUNTER — Inpatient Hospital Stay: Payer: Medicare Other | Attending: Internal Medicine

## 2020-02-25 ENCOUNTER — Inpatient Hospital Stay: Payer: Medicare Other

## 2020-02-25 ENCOUNTER — Ambulatory Visit (HOSPITAL_COMMUNITY)
Admission: RE | Admit: 2020-02-25 | Discharge: 2020-02-25 | Disposition: A | Discharge status: Home / Self Care | Payer: Medicare Other | Source: Ambulatory Visit | Attending: Internal Medicine | Admitting: Internal Medicine

## 2020-02-25 ENCOUNTER — Other Ambulatory Visit: Payer: Self-pay

## 2020-02-25 DIAGNOSIS — M549 Dorsalgia, unspecified: Secondary | ICD-10-CM | POA: Diagnosis not present

## 2020-02-25 DIAGNOSIS — C7801 Secondary malignant neoplasm of right lung: Secondary | ICD-10-CM | POA: Diagnosis not present

## 2020-02-25 DIAGNOSIS — C3490 Malignant neoplasm of unspecified part of unspecified bronchus or lung: Secondary | ICD-10-CM | POA: Diagnosis present

## 2020-02-25 DIAGNOSIS — K769 Liver disease, unspecified: Secondary | ICD-10-CM | POA: Insufficient documentation

## 2020-02-25 DIAGNOSIS — C342 Malignant neoplasm of middle lobe, bronchus or lung: Secondary | ICD-10-CM | POA: Diagnosis present

## 2020-02-25 DIAGNOSIS — E7800 Pure hypercholesterolemia, unspecified: Secondary | ICD-10-CM | POA: Diagnosis not present

## 2020-02-25 DIAGNOSIS — C3491 Malignant neoplasm of unspecified part of right bronchus or lung: Secondary | ICD-10-CM

## 2020-02-25 DIAGNOSIS — Z7982 Long term (current) use of aspirin: Secondary | ICD-10-CM | POA: Insufficient documentation

## 2020-02-25 DIAGNOSIS — T451X5A Adverse effect of antineoplastic and immunosuppressive drugs, initial encounter: Secondary | ICD-10-CM | POA: Insufficient documentation

## 2020-02-25 DIAGNOSIS — J439 Emphysema, unspecified: Secondary | ICD-10-CM | POA: Diagnosis not present

## 2020-02-25 DIAGNOSIS — K2289 Other specified disease of esophagus: Secondary | ICD-10-CM | POA: Diagnosis not present

## 2020-02-25 DIAGNOSIS — C3431 Malignant neoplasm of lower lobe, right bronchus or lung: Secondary | ICD-10-CM | POA: Insufficient documentation

## 2020-02-25 DIAGNOSIS — G620 Drug-induced polyneuropathy: Secondary | ICD-10-CM | POA: Diagnosis not present

## 2020-02-25 DIAGNOSIS — I10 Essential (primary) hypertension: Secondary | ICD-10-CM | POA: Diagnosis not present

## 2020-02-25 DIAGNOSIS — I700 Atherosclerosis of aorta: Secondary | ICD-10-CM | POA: Diagnosis not present

## 2020-02-25 DIAGNOSIS — Z79899 Other long term (current) drug therapy: Secondary | ICD-10-CM | POA: Insufficient documentation

## 2020-02-25 DIAGNOSIS — N281 Cyst of kidney, acquired: Secondary | ICD-10-CM | POA: Insufficient documentation

## 2020-02-25 LAB — CMP (CANCER CENTER ONLY)
ALT: 12 U/L (ref 0–44)
AST: 21 U/L (ref 15–41)
Albumin: 3.6 g/dL (ref 3.5–5.0)
Alkaline Phosphatase: 68 U/L (ref 38–126)
Anion gap: 6 (ref 5–15)
BUN: 15 mg/dL (ref 8–23)
CO2: 24 mmol/L (ref 22–32)
Calcium: 9.4 mg/dL (ref 8.9–10.3)
Chloride: 110 mmol/L (ref 98–111)
Creatinine: 1.36 mg/dL — ABNORMAL HIGH (ref 0.61–1.24)
GFR, Estimated: 52 mL/min — ABNORMAL LOW (ref >60)
Glucose, Bld: 102 mg/dL — ABNORMAL HIGH (ref 70–99)
Potassium: 4.1 mmol/L (ref 3.5–5.1)
Sodium: 140 mmol/L (ref 135–145)
Total Bilirubin: 0.6 mg/dL (ref 0.3–1.2)
Total Protein: 7.9 g/dL (ref 6.5–8.1)

## 2020-02-25 LAB — CBC WITH DIFFERENTIAL (CANCER CENTER ONLY)
Abs Immature Granulocytes: 0.01 10*3/uL (ref 0.00–0.07)
Basophils Absolute: 0 10*3/uL (ref 0.0–0.1)
Basophils Relative: 0 %
Eosinophils Absolute: 0.2 10*3/uL (ref 0.0–0.5)
Eosinophils Relative: 4 %
HCT: 40.5 % (ref 39.0–52.0)
Hemoglobin: 13.4 g/dL (ref 13.0–17.0)
Immature Granulocytes: 0 %
Lymphocytes Relative: 29 %
Lymphs Abs: 1.6 10*3/uL (ref 0.7–4.0)
MCH: 31.3 pg (ref 26.0–34.0)
MCHC: 33.1 g/dL (ref 30.0–36.0)
MCV: 94.6 fL (ref 80.0–100.0)
Monocytes Absolute: 0.7 10*3/uL (ref 0.1–1.0)
Monocytes Relative: 13 %
Neutro Abs: 2.9 10*3/uL (ref 1.7–7.7)
Neutrophils Relative %: 54 %
Platelet Count: 109 10*3/uL — ABNORMAL LOW (ref 150–400)
RBC: 4.28 MIL/uL (ref 4.22–5.81)
RDW: 13.2 % (ref 11.5–15.5)
WBC Count: 5.3 10*3/uL (ref 4.0–10.5)
nRBC: 0 % (ref 0.0–0.2)

## 2020-02-25 MED ORDER — HEPARIN SOD (PORK) LOCK FLUSH 100 UNIT/ML IV SOLN
500.0000 [IU] | Freq: Once | INTRAVENOUS | Status: AC | PRN
  Administered 2020-02-25: 11:00:00 500 [IU]
  Filled 2020-02-25: qty 5

## 2020-02-25 MED ORDER — HEPARIN SOD (PORK) LOCK FLUSH 100 UNIT/ML IV SOLN
INTRAVENOUS | Status: AC
  Administered 2020-02-25: 12:00:00 500 [IU] via INTRAVENOUS
  Filled 2020-02-25: qty 5

## 2020-02-25 MED ORDER — IOHEXOL 300 MG/ML  SOLN
75.0000 mL | Freq: Once | INTRAMUSCULAR | Status: AC | PRN
  Administered 2020-02-25: 12:00:00 75 mL via INTRAVENOUS

## 2020-02-25 MED ORDER — HEPARIN SOD (PORK) LOCK FLUSH 100 UNIT/ML IV SOLN: 500.0000 [IU] | Freq: Once | INTRAVENOUS | Status: AC

## 2020-02-25 MED ORDER — SODIUM CHLORIDE 0.9% FLUSH
10.0000 mL | INTRAVENOUS | Status: DC | PRN
  Administered 2020-02-25: 11:00:00 10 mL
  Filled 2020-02-25: qty 10

## 2020-02-25 NOTE — Patient Instructions (Signed)

## 2020-02-27 ENCOUNTER — Encounter: Payer: Self-pay | Admitting: Internal Medicine

## 2020-02-27 ENCOUNTER — Other Ambulatory Visit: Payer: Self-pay

## 2020-02-27 ENCOUNTER — Inpatient Hospital Stay (HOSPITAL_BASED_OUTPATIENT_CLINIC_OR_DEPARTMENT_OTHER): Payer: Medicare Other | Admitting: Internal Medicine

## 2020-02-27 VITALS — BP 144/89 | HR 86 | Temp 97.8°F | Resp 20 | Ht 68.0 in | Wt 172.7 lb

## 2020-02-27 DIAGNOSIS — C3490 Malignant neoplasm of unspecified part of unspecified bronchus or lung: Secondary | ICD-10-CM

## 2020-02-27 DIAGNOSIS — C3491 Malignant neoplasm of unspecified part of right bronchus or lung: Secondary | ICD-10-CM

## 2020-02-27 DIAGNOSIS — I10 Essential (primary) hypertension: Secondary | ICD-10-CM

## 2020-02-27 DIAGNOSIS — C3431 Malignant neoplasm of lower lobe, right bronchus or lung: Secondary | ICD-10-CM | POA: Diagnosis not present

## 2020-02-27 NOTE — Progress Notes (Signed)
[place] Telephone:([phone]) [phone]   [phone]) [phone]  OFFICE PROGRESS NOTE  [name], PA-C [address] [place]  [place] [address]  DIAGNOSIS: Recurrent non-small cell lung cancer, squamous cell carcinoma presented with right lower lobe lung nodule in August 2021 initially diagnosed as stage IIB (T3, N0, M0) non-small cell lung cancer, invasive well-differentiated squamous cell carcinoma presented with right middle lobe lung mass.  PRIOR THERAPY:  1) Status post right VATS, right middle lobectomy with mediastinal lymph node sampling under the care of Dr. Roxan Hockey on 09/19/2017.  The tumor measured 4.2 cm but the carcinoma extends through the visceral pleura. 2) Adjuvant systemic chemotherapy with carboplatin for AUC of 6 and paclitaxel 200 mg/M2 every 3 weeks.  First dose 11/16/2017.  Status post 3 cycles.  Last dose was giving January 05, 2018 discontinued secondary to intolerance with significant peripheral neuropathy. 3) curative radiotherapy to the right lower lobe pulmonary nodule under the care of Dr. Tammi Klippel completed on December 21, 2019.  CURRENT THERAPY: Observation.  INTERVAL HISTORY: Danny Duke 84 y.o. male returns to the clinic today for follow-up visit accompanied by his daughter.  The patient is feeling fine today with no concerning complaints.  He denied having any chest pain, shortness of breath, cough or hemoptysis.  He tolerated the previous radiotherapy to the right lower lobe pulmonary nodule fairly well.  He denied having any nausea, vomiting, diarrhea or constipation.  He has no headache or visual changes.  He is here today for evaluation with repeat CT scan of the chest for restaging of his disease.   MEDICAL HISTORY: Past Medical History:  Diagnosis Date  . Chronic back pain   . Hypercholesterolemia   . Hypertension   . lung ca dx'd 09/2017   possible lung cancer    ALLERGIES:  has No Known Allergies.  MEDICATIONS:   Current Outpatient Medications  Medication Sig Dispense Refill  . acetaminophen (TYLENOL) 500 MG tablet Take 1,000 mg by mouth every 6 (six) hours as needed for moderate pain or headache.    Marland Kitchen amLODipine (NORVASC) 10 MG tablet Take 10 mg by mouth daily.    Marland Kitchen aspirin EC 81 MG tablet Take 81 mg by mouth daily.    . chlorproMAZINE (THORAZINE) 25 MG tablet Take 1 tablet (25 mg total) by mouth 3 (three) times daily as needed. (Patient not taking: Reported on 10/17/2019) 30 tablet 0  . DULoxetine (CYMBALTA) 60 MG capsule TAKE 1 CAPSULE(60 MG) BY MOUTH DAILY 30 capsule 3  . famotidine (PEPCID AC MAXIMUM STRENGTH) 20 MG tablet Take 20 mg by mouth daily as needed for indigestion.    . gabapentin (NEURONTIN) 300 MG capsule TAKE 3 CAPSULES(900 MG) BY MOUTH THREE TIMES DAILY (Patient taking differently: Take 900 mg by mouth 3 (three) times daily. ) 120 capsule 6  . lidocaine-prilocaine (EMLA) cream Apply 1 application topically as needed. 30 g 0  . losartan (COZAAR) 25 MG tablet Take 25 mg by mouth daily.     . meclizine (ANTIVERT) 25 MG tablet Take 25 mg by mouth 3 (three) times daily as needed for dizziness.    . metoprolol succinate (TOPROL-XL) 25 MG 24 hr tablet Take 1 tablet (25 mg total) by mouth daily. 60 tablet 1  . Oxycodone HCl 20 MG TABS Take 20 mg by mouth every 6 (six) hours as needed (pain).   0  . simvastatin (ZOCOR) 20 MG tablet Take 20 mg by mouth daily.    Marland Kitchen  Vitamin D, Ergocalciferol, (DRISDOL) 1.25 MG (50000 UNIT) CAPS capsule Take 50,000 Units by mouth once a week.     No current facility-administered medications for this visit.    SURGICAL HISTORY:  Past Surgical History:  Procedure Laterality Date  . BRONCHIAL NEEDLE ASPIRATION BIOPSY  08/05/2017   Procedure: BRONCHIAL NEEDLE ASPIRATION BIOPSIES;  Surgeon: Juanito Doom, MD;  Location: [facility];  Service: Cardiopulmonary;;  . ENDOBRONCHIAL ULTRASOUND Bilateral 08/05/2017   Procedure: ENDOBRONCHIAL ULTRASOUND;  Surgeon:  Juanito Doom, MD;  Location: [facility];  Service: Cardiopulmonary;  Laterality: Bilateral;  . IR IMAGING GUIDED PORT INSERTION  12/27/2017  . MEDIASTINOSCOPY N/A 09/19/2017   Procedure: MEDIASTINOSCOPY;  Surgeon: [name], MD;  Location: [facility] OR;  Service: Thoracic;  Laterality: N/A;  . MULTIPLE TOOTH EXTRACTIONS    . VIDEO ASSISTED THORACOSCOPY (VATS)/ LOBECTOMY Right 09/19/2017   Procedure: RIGHT VIDEO ASSISTED THORACOSCOPY (VATS)/ LOBECTOMY;  Surgeon: [name], MD;  Location: [facility];  Service: Thoracic;  Laterality: Right;    REVIEW OF SYSTEMS:  A comprehensive review of systems was negative.   PHYSICAL EXAMINATION: General appearance: alert, cooperative and no distress Head: Normocephalic, without obvious abnormality, atraumatic Neck: no adenopathy, no JVD, supple, symmetrical, trachea midline and thyroid not enlarged, symmetric, no tenderness/mass/nodules Lymph nodes: Cervical, supraclavicular, and axillary nodes normal. Resp: clear to auscultation bilaterally Back: symmetric, no curvature. ROM normal. No CVA tenderness. Cardio: regular rate and rhythm, S1, S2 normal, no murmur, click, rub or gallop GI: soft, non-tender; bowel sounds normal; no masses,  no organomegaly Extremities: extremities normal, atraumatic, no cyanosis or edema  ECOG PERFORMANCE STATUS: 1 - Symptomatic but completely ambulatory  Blood pressure (!) 144/89, pulse 86, temperature 97.8 F (36.6 C), temperature source Tympanic, resp. rate 20, height 5\' 8"  (1.727 m), weight 172 lb 11.2 oz (78.3 kg), SpO2 100 %.  LABORATORY DATA: Lab Results  Component Value Date   WBC 5.3 02/25/2020   HGB 13.4 02/25/2020   HCT 40.5 02/25/2020   MCV 94.6 02/25/2020   PLT 109 (L) 02/25/2020      Chemistry      Component Value Date/Time   NA 140 02/25/2020 1045   K 4.1 02/25/2020 1045   CL 110 02/25/2020 1045   CO2 24 02/25/2020 1045   BUN 15 02/25/2020 1045   CREATININE 1.36 (H) 02/25/2020  1045      Component Value Date/Time   CALCIUM 9.4 02/25/2020 1045   ALKPHOS 68 02/25/2020 1045   AST 21 02/25/2020 1045   ALT 12 02/25/2020 1045   BILITOT 0.6 02/25/2020 1045       RADIOGRAPHIC STUDIES: CT Chest W Contrast  Result Date: 02/25/2020 CLINICAL DATA:  Restaging non-small cell lung cancer post chemotherapy. Hypermetabolic right lower lobe pulmonary nodule with squamous cell carcinoma on biopsy 10/22/2019 treated with SB RT. EXAM: CT CHEST WITH CONTRAST TECHNIQUE: Multidetector CT imaging of the chest was performed during intravenous contrast administration. CONTRAST:  4mL OMNIPAQUE IOHEXOL 300 MG/ML  SOLN COMPARISON:  Chest CT 09/18/2019.  PET-CT 09/27/2019 FINDINGS: Cardiovascular: Right IJ Port-A-Cath extends to the superior cavoatrial junction. There is atherosclerosis of the aorta, great vessels and coronary arteries. No acute vascular findings. The heart size is normal. There is no pericardial effusion. Mediastinum/Nodes: Stable chronic partially calcified mediastinal and hilar lymph nodes. In addition, there is a stable noncalcified 1.6 cm right paratracheal node on image 42/2. No progressive adenopathy identified. Stable mild dilatation of the distal esophagus. Stable minimal heterogeneity of the thyroid gland.  Lungs/Pleura: There is no pleural effusion or pneumothorax. There are new radiation changes posteriorly in the right lower lobe, measuring up to 2.0 cm on image 62/7, obscuring the subpleural hypermetabolic nodule present in this area previously. There is a stable 3 mm left upper lobe nodule on image 61/7. No new or enlarging pulmonary nodules. Again demonstrated is severe chronic lung disease with biapical pleuroparenchymal scarring, centrilobular emphysema and scattered bulla. There are stable postsurgical changes from previous right upper lobe wedge resection and right middle lobectomy. Upper abdomen: The visualized upper abdomen appears stable without acute findings.  There is chronic biliary dilatation and bilateral renal cysts. Low-density hepatic lesions are grossly stable. Musculoskeletal/Chest wall: There is no chest wall mass or suspicious osseous finding. IMPRESSION: 1. New radiation changes posteriorly in the right lower lobe, obscuring the subpleural hypermetabolic nodule present in this area previously. 2. Otherwise stable chest CT without evidence of additional metastatic disease. 3. Stable chronic partially calcified mediastinal and hilar lymph nodes and stable chronic lung disease with biapical pleuroparenchymal scarring and scattered bulla. 4. Aortic Atherosclerosis (ICD10-I70.0) and Emphysema (ICD10-J43.9). Electronically Signed   By: Richardean Sale M.D.   On: 02/25/2020 14:54    ASSESSMENT AND PLAN: This is a very pleasant 84 years old African-American male with likely recurrent lung cancer initially diagnosed as stage IIB (T3, N0, M0) invasive well-differentiated squamous cell carcinoma presented with right middle lobe lung mass status post right middle lobectomy with lymph node sampling on September 19, 2017 under the care of Dr. Roxan Hockey. The patient underwent adjuvant treatment with systemic chemotherapy with carboplatin for AUC of 6 and paclitaxel 200 mg/M2 every 3 weeks with Neulasta support status post 3 cycles. He tolerated this treatment well except for the chemotherapy-induced peripheral neuropathy and he discontinued his treatment after cycle #3. A recent CT scan of the chest showed further increase in the previously identified subpleural nodule in the right lower lobe.  This is suspicious for metastatic or metachronous malignancy.  The patient had a PET scan that showed hypermetabolic activity and a new pulmonary nodule in the posterior right chest along the pleura suspicious for disease recurrence.  There was also new right thoracic inlet lymph node raising the question of metastatic involvement as well as numerous hypermetabolic lymph nodes in  the mediastinum and hila. 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 He underwent CT-guided core biopsy of the right lower lobe lung nodule and the final pathology was consistent with squamous cell carcinoma. He underwent SBRT to the right lower lobe lung nodule by Dr. Tammi Klippel and tolerated the procedure fairly well. He had repeat CT scan of the chest performed recently.  I personally and independently reviewed the scans and discussed the results with the patient and his daughter today. His scan showed no concerning findings for disease progression and there was development of radiation changes in the area of the previous radiotherapy. I recommended for him to continue on observation with repeat CT scan of the chest in 6 months. The patient requested removal of the Port-A-Cath and I will refer him to interventional radiology for this procedure. He was advised to call immediately if he has any other concerning symptoms in the interval. The patient voices understanding of current disease status and treatment options and is in agreement with the current care plan.  All questions were answered. The patient knows to call the clinic with any problems, questions or  concerns. We can certainly see the patient much sooner if necessary.  Disclaimer: This note was dictated with voice recognition software. Similar sounding words can inadvertently be transcribed and may not be corrected upon review.

## 2020-03-03 ENCOUNTER — Telehealth: Payer: Self-pay | Admitting: Internal Medicine

## 2020-03-03 NOTE — Telephone Encounter (Signed)
Scheduled appts per 12/23 los. Pt's spouse confirmed appt dates and times.

## 2020-03-24 ENCOUNTER — Other Ambulatory Visit: Payer: Self-pay | Admitting: Radiology

## 2020-03-25 ENCOUNTER — Ambulatory Visit (HOSPITAL_COMMUNITY)
Admission: RE | Admit: 2020-03-25 | Discharge: 2020-03-25 | Disposition: A | Discharge status: Home / Self Care | Payer: Medicare Other | Source: Ambulatory Visit | Attending: Internal Medicine | Admitting: Internal Medicine

## 2020-03-25 ENCOUNTER — Encounter (HOSPITAL_COMMUNITY): Payer: Self-pay

## 2020-03-25 ENCOUNTER — Other Ambulatory Visit: Payer: Self-pay

## 2020-03-25 DIAGNOSIS — C3490 Malignant neoplasm of unspecified part of unspecified bronchus or lung: Secondary | ICD-10-CM

## 2020-03-25 DIAGNOSIS — Z79899 Other long term (current) drug therapy: Secondary | ICD-10-CM | POA: Diagnosis not present

## 2020-03-25 DIAGNOSIS — Z452 Encounter for adjustment and management of vascular access device: Secondary | ICD-10-CM | POA: Diagnosis not present

## 2020-03-25 DIAGNOSIS — Z7982 Long term (current) use of aspirin: Secondary | ICD-10-CM | POA: Insufficient documentation

## 2020-03-25 DIAGNOSIS — Z87891 Personal history of nicotine dependence: Secondary | ICD-10-CM | POA: Diagnosis not present

## 2020-03-25 DIAGNOSIS — Z85118 Personal history of other malignant neoplasm of bronchus and lung: Secondary | ICD-10-CM | POA: Insufficient documentation

## 2020-03-25 HISTORY — PX: IR REMOVAL TUN ACCESS W/ PORT W/O FL MOD SED: IMG2290

## 2020-03-25 LAB — BASIC METABOLIC PANEL
Anion gap: 8 (ref 5–15)
BUN: 16 mg/dL (ref 8–23)
CO2: 25 mmol/L (ref 22–32)
Calcium: 9.8 mg/dL (ref 8.9–10.3)
Chloride: 106 mmol/L (ref 98–111)
Creatinine, Ser: 1.35 mg/dL — ABNORMAL HIGH (ref 0.61–1.24)
GFR, Estimated: 52 mL/min — ABNORMAL LOW (ref >60)
Glucose, Bld: 94 mg/dL (ref 70–99)
Potassium: 3.9 mmol/L (ref 3.5–5.1)
Sodium: 139 mmol/L (ref 135–145)

## 2020-03-25 LAB — CBC WITH DIFFERENTIAL/PLATELET
Abs Immature Granulocytes: 0.02 10*3/uL (ref 0.00–0.07)
Basophils Absolute: 0 10*3/uL (ref 0.0–0.1)
Basophils Relative: 0 %
Eosinophils Absolute: 0.2 10*3/uL (ref 0.0–0.5)
Eosinophils Relative: 4 %
HCT: 44.8 % (ref 39.0–52.0)
Hemoglobin: 14.7 g/dL (ref 13.0–17.0)
Immature Granulocytes: 0 %
Lymphocytes Relative: 37 %
Lymphs Abs: 2 10*3/uL (ref 0.7–4.0)
MCH: 31.6 pg (ref 26.0–34.0)
MCHC: 32.8 g/dL (ref 30.0–36.0)
MCV: 96.3 fL (ref 80.0–100.0)
Monocytes Absolute: 0.7 10*3/uL (ref 0.1–1.0)
Monocytes Relative: 13 %
Neutro Abs: 2.4 10*3/uL (ref 1.7–7.7)
Neutrophils Relative %: 46 %
Platelets: 122 10*3/uL — ABNORMAL LOW (ref 150–400)
RBC: 4.65 MIL/uL (ref 4.22–5.81)
RDW: 13.2 % (ref 11.5–15.5)
WBC: 5.3 10*3/uL (ref 4.0–10.5)
nRBC: 0 % (ref 0.0–0.2)

## 2020-03-25 LAB — PROTIME-INR
INR: 1 (ref 0.8–1.2)
Prothrombin Time: 13.1 seconds (ref 11.4–15.2)

## 2020-03-25 MED ORDER — FENTANYL CITRATE (PF) 100 MCG/2ML IJ SOLN
INTRAMUSCULAR | Status: AC | PRN
  Administered 2020-03-25 (×2): 50 ug via INTRAVENOUS

## 2020-03-25 MED ORDER — HYDROCODONE-ACETAMINOPHEN 5-325 MG PO TABS
1.0000 | ORAL_TABLET | ORAL | Status: DC | PRN
Start: 2020-03-25 — End: 2020-03-26

## 2020-03-25 MED ORDER — SODIUM CHLORIDE 0.9 % IV SOLN: INTRAVENOUS | Status: DC

## 2020-03-25 MED ORDER — LIDOCAINE-EPINEPHRINE 1 %-1:100000 IJ SOLN
INTRAMUSCULAR | Status: AC
  Filled 2020-03-25: qty 1

## 2020-03-25 MED ORDER — FENTANYL CITRATE (PF) 100 MCG/2ML IJ SOLN
INTRAMUSCULAR | Status: AC
  Filled 2020-03-25: qty 2

## 2020-03-25 MED ORDER — MIDAZOLAM HCL 2 MG/2ML IJ SOLN
INTRAMUSCULAR | Status: AC
  Filled 2020-03-25: qty 2

## 2020-03-25 MED ORDER — MIDAZOLAM HCL 2 MG/2ML IJ SOLN
INTRAMUSCULAR | Status: AC | PRN
  Administered 2020-03-25 (×2): 1 mg via INTRAVENOUS

## 2020-03-25 MED ORDER — LIDOCAINE-EPINEPHRINE 1 %-1:100000 IJ SOLN
INTRAMUSCULAR | Status: AC | PRN
  Administered 2020-03-25: 10 mL

## 2020-03-25 MED ORDER — CEFAZOLIN SODIUM-DEXTROSE 2-4 GM/100ML-% IV SOLN: 2.0000 g | INTRAVENOUS | Status: DC

## 2020-03-25 MED ORDER — CEFAZOLIN SODIUM-DEXTROSE 2-4 GM/100ML-% IV SOLN
INTRAVENOUS | Status: AC
  Filled 2020-03-25: qty 100

## 2020-03-25 NOTE — H&P (Signed)
Chief Complaint: Patient was seen in consultation today for port removal at the request of [facility]  Referring Physician(s): Mohamed,Mohamed  Supervising Physician: Markus Daft  Patient Status: [facility] - Out-pt  History of Present Illness: Danny Duke is a 85 y.o. male with hx of lung cancer. Had port placed in 2019 and has now completed treatments. He is referred for port removal. PMHx, meds, labs, imaging, allergies reviewed. Feels well, no recent fevers, chills, illness. Has been NPO today as directed.   Past Medical History:  Diagnosis Date  . Chronic back pain   . Hypercholesterolemia   . Hypertension   . lung ca dx'd 09/2017   possible lung cancer    Past Surgical History:  Procedure Laterality Date  . BRONCHIAL NEEDLE ASPIRATION BIOPSY  08/05/2017   Procedure: BRONCHIAL NEEDLE ASPIRATION BIOPSIES;  Surgeon: Juanito Doom, MD;  Location: [facility];  Service: Cardiopulmonary;;  . ENDOBRONCHIAL ULTRASOUND Bilateral 08/05/2017   Procedure: ENDOBRONCHIAL ULTRASOUND;  Surgeon: Juanito Doom, MD;  Location: [facility];  Service: Cardiopulmonary;  Laterality: Bilateral;  . IR IMAGING GUIDED PORT INSERTION  12/27/2017  . MEDIASTINOSCOPY N/A 09/19/2017   Procedure: MEDIASTINOSCOPY;  Surgeon: [name], MD;  Location: [facility] OR;  Service: Thoracic;  Laterality: N/A;  . MULTIPLE TOOTH EXTRACTIONS    . VIDEO ASSISTED THORACOSCOPY (VATS)/ LOBECTOMY Right 09/19/2017   Procedure: RIGHT VIDEO ASSISTED THORACOSCOPY (VATS)/ LOBECTOMY;  Surgeon: [name], MD;  Location: [facility];  Service: Thoracic;  Laterality: Right;    Allergies: Patient has no known allergies.  Medications: Prior to Admission medications   Medication Sig Start Date End Date Taking? Authorizing Provider  acetaminophen (TYLENOL) 500 MG tablet Take 1,000 mg by mouth every 6 (six) hours as needed for moderate pain or headache.   Yes [provider]  amLODipine  (NORVASC) 10 MG tablet Take 10 mg by mouth daily.   Yes [provider]  aspirin EC 81 MG tablet Take 81 mg by mouth daily.   Yes [provider]  DULoxetine (CYMBALTA) 60 MG capsule TAKE 1 CAPSULE(60 MG) BY MOUTH DAILY 01/10/20  Yes Vaslow, Acey Lav, MD  famotidine (PEPCID) 20 MG tablet Take 20 mg by mouth daily as needed for indigestion.   Yes [provider]  gabapentin (NEURONTIN) 300 MG capsule TAKE 3 CAPSULES(900 MG) BY MOUTH THREE TIMES DAILY Patient taking differently: Take 900 mg by mouth 3 (three) times daily. 06/14/19  Yes Vaslow, Acey Lav, MD  losartan (COZAAR) 25 MG tablet Take 25 mg by mouth daily.  11/26/18  Yes [provider]  meclizine (ANTIVERT) 25 MG tablet Take 25 mg by mouth 3 (three) times daily as needed for dizziness. 05/28/19  Yes [provider]  metoprolol succinate (TOPROL-XL) 25 MG 24 hr tablet Take 1 tablet (25 mg total) by mouth daily. 09/26/17  Yes Gold, Wayne E, PA-C  Oxycodone HCl 20 MG TABS Take 20 mg by mouth every 6 (six) hours as needed (pain).  10/31/17  Yes [provider]  simvastatin (ZOCOR) 20 MG tablet Take 20 mg by mouth daily.   Yes [provider]  Vitamin D, Ergocalciferol, (DRISDOL) 1.25 MG (50000 UNIT) CAPS capsule Take 50,000 Units by mouth once a week. 10/14/19  Yes [provider]  chlorproMAZINE (THORAZINE) 25 MG tablet Take 1 tablet (25 mg total) by mouth 3 (three) times daily as needed. Patient not taking: Reported on 10/17/2019 12/01/17   Maryanna Shape, NP  lidocaine-prilocaine (EMLA) cream Apply  1 application topically as needed. 08/18/18   Curt Bears, MD     Family History  Problem Relation Age of Onset  . Colon cancer Brother   . Stomach cancer Brother     Social History   Socioeconomic History  . Marital status: Married    Spouse name: Not on file  . Number of children: Not on file  . Years of education: Not on file  . Highest education level: Not on file   Occupational History  . Not on file  Tobacco Use  . Smoking status: Former Smoker    Packs/day: 0.50    Years: 40.00    Pack years: 20.00    Quit date: 03/08/1997    Years since quitting: 23.0  . Smokeless tobacco: Never Used  Vaping Use  . Vaping Use: Never used  Substance and Sexual Activity  . Alcohol use: No  . Drug use: No  . Sexual activity: Not on file  Other Topics Concern  . Not on file  Social History Narrative  . Not on file   Social Determinants of Health   Financial Resource Strain: Not on file  Food Insecurity: Not on file  Transportation Needs: Not on file  Physical Activity: Not on file  Stress: Not on file  Social Connections: Not on file    Review of Systems: A 12 point ROS discussed and pertinent positives are indicated in the HPI above.  All other systems are negative.  Review of Systems  Vital Signs: BP 113/75 (BP Location: Right Arm)   Pulse 71   Temp 98.7 F (37.1 C) (Oral)   Resp 20   Ht 5\' 8"  (1.727 m)   Wt 78.9 kg   SpO2 100%   BMI 26.46 kg/m   Physical Exam Constitutional:      Appearance: Normal appearance.  HENT:     Head: Normocephalic and atraumatic.     Mouth/Throat:     Mouth: Mucous membranes are moist.     Pharynx: Oropharynx is clear.  Cardiovascular:     Rate and Rhythm: Normal rate and regular rhythm.     Heart sounds: Normal heart sounds.  Pulmonary:     Effort: Pulmonary effort is normal.     Breath sounds: Normal breath sounds.  Skin:    General: Skin is warm and dry.     Comments: (R)upper chest port palpable. Incision well healed  Neurological:     General: No focal deficit present.     Mental Status: He is alert and oriented to person, place, and time.  Psychiatric:        Mood and Affect: Mood normal.        Thought Content: Thought content normal.        Judgment: Judgment normal.     Imaging: CT Chest W Contrast  Result Date: 02/25/2020 CLINICAL DATA:  Restaging non-small cell lung cancer  post chemotherapy. Hypermetabolic right lower lobe pulmonary nodule with squamous cell carcinoma on biopsy 10/22/2019 treated with SB RT. EXAM: CT CHEST WITH CONTRAST TECHNIQUE: Multidetector CT imaging of the chest was performed during intravenous contrast administration. CONTRAST:  87mL OMNIPAQUE IOHEXOL 300 MG/ML  SOLN COMPARISON:  Chest CT 09/18/2019.  PET-CT 09/27/2019 FINDINGS: Cardiovascular: Right IJ Port-A-Cath extends to the superior cavoatrial junction. There is atherosclerosis of the aorta, great vessels and coronary arteries. No acute vascular findings. The heart size is normal. There is no pericardial effusion. Mediastinum/Nodes: Stable chronic partially calcified mediastinal and hilar lymph nodes. In addition,  there is a stable noncalcified 1.6 cm right paratracheal node on image 42/2. No progressive adenopathy identified. Stable mild dilatation of the distal esophagus. Stable minimal heterogeneity of the thyroid gland. Lungs/Pleura: There is no pleural effusion or pneumothorax. There are new radiation changes posteriorly in the right lower lobe, measuring up to 2.0 cm on image 62/7, obscuring the subpleural hypermetabolic nodule present in this area previously. There is a stable 3 mm left upper lobe nodule on image 61/7. No new or enlarging pulmonary nodules. Again demonstrated is severe chronic lung disease with biapical pleuroparenchymal scarring, centrilobular emphysema and scattered bulla. There are stable postsurgical changes from previous right upper lobe wedge resection and right middle lobectomy. Upper abdomen: The visualized upper abdomen appears stable without acute findings. There is chronic biliary dilatation and bilateral renal cysts. Low-density hepatic lesions are grossly stable. Musculoskeletal/Chest wall: There is no chest wall mass or suspicious osseous finding. IMPRESSION: 1. New radiation changes posteriorly in the right lower lobe, obscuring the subpleural hypermetabolic nodule  present in this area previously. 2. Otherwise stable chest CT without evidence of additional metastatic disease. 3. Stable chronic partially calcified mediastinal and hilar lymph nodes and stable chronic lung disease with biapical pleuroparenchymal scarring and scattered bulla. 4. Aortic Atherosclerosis (ICD10-I70.0) and Emphysema (ICD10-J43.9). Electronically Signed   By: Richardean Sale M.D.   On: 02/25/2020 14:54    Labs:  CBC: Recent Labs    06/19/19 1135 09/18/19 1106 10/22/19 0928 02/25/20 1045  WBC 5.9 5.5 5.0 5.3  HGB 12.9* 12.6* 13.7 13.4  HCT 39.5 38.0* 41.9 40.5  PLT 135* 111* 117* 109*    COAGS: Recent Labs    10/22/19 0928 03/25/20 1233  INR 1.1 1.0    BMP: Recent Labs    06/19/19 1135 09/18/19 1106 02/25/20 1045  NA 142 139 140  K 4.0 4.0 4.1  CL 110 110 110  CO2 24 23 24   GLUCOSE 99 113* 102*  BUN 11 17 15   CALCIUM 9.2 9.6 9.4  CREATININE 1.35* 1.33* 1.36*  GFRNONAA 49* 49* 52*  GFRAA 57* 57*  --     LIVER FUNCTION TESTS: Recent Labs    06/19/19 1135 09/18/19 1106 02/25/20 1045  BILITOT 0.5 0.4 0.6  AST 26 23 21   ALT 14 14 12   ALKPHOS 66 61 68  PROT 7.7 7.6 7.9  ALBUMIN 3.7 3.6 3.6    TUMOR MARKERS: No results for input(s): AFPTM, CEA, CA199, CHROMGRNA in the last 8760 hours.  Assessment and Plan: Hx lung cancer For port removal. Risks and benefits of port removal was discussed with the patient including, but not limited to bleeding, infection, pneumothorax, or fibrin sheath development and need for additional procedures.  All of the patient's questions were answered, patient is agreeable to proceed. Consent signed and in chart.    Thank you for this interesting consult.  I greatly enjoyed meeting Danny Duke and look forward to participating in their care.  A copy of this report was sent to the requesting provider on this date.  Electronically Signed: Ascencion Dike, PA-C 03/25/2020, 1:06 PM   I spent a total of 20  minutes in face to face in clinical consultation, greater than 50% of which was counseling/coordinating care for port removal

## 2020-03-25 NOTE — Procedures (Signed)
Interventional Radiology Procedure:   Indications: Port is no longer needed, completed treatments.  Procedure: Port removal  Findings: Complete removal of right chest port and retention sutures.   Complications: None     EBL: Minimal, less than 10 ml  Plan: Discharge to home in 1 hour   Ramon Zanders R. Anselm Pancoast, MD  Pager: [phone]

## 2020-03-25 NOTE — Discharge Instructions (Signed)
Please call Interventional Radiology clinic [phone] with any questions or concerns.  You may remove your dressing and shower tomorrow.   Implanted Port Removal, Care After This sheet gives you information about how to care for yourself after your procedure. Your health care provider may also give you more specific instructions. If you have problems or questions, contact your health care provider. What can I expect after the procedure? After the procedure, it is common to have: Soreness or pain near your incision. Some swelling or bruising near your incision. Follow these instructions at home: Medicines Take over-the-counter and prescription medicines only as told by your health care provider. If you were prescribed an antibiotic medicine, take it as told by your health care provider. Do not stop taking the antibiotic even if you start to feel better. Bathing Do not take baths, swim, or use a hot tub until your health care provider approves. Ask your health care provider if you can take showers. You may only be allowed to take sponge baths. Incision care Follow instructions from your health care provider about how to take care of your incision. Make sure you: Wash your hands with soap and water before you change your bandage (dressing). If soap and water are not available, use hand sanitizer. Change your dressing as told by your health care provider. Keep your dressing dry. Leave stitches (sutures), skin glue, or adhesive strips in place. These skin closures may need to stay in place for 2 weeks or longer. If adhesive strip edges start to loosen and curl up, you may trim the loose edges. Do not remove adhesive strips completely unless your health care provider tells you to do that. Check your incision area every day for signs of infection. Check for: More redness, swelling, or pain. More fluid or blood. Warmth. Pus or a bad smell.    Driving Do not drive for 24 hours if you were  given a medicine to help you relax (sedative) during your procedure. If you did not receive a sedative, ask your health care provider when it is safe to drive.    Activity Return to your normal activities as told by your health care provider. Ask your health care provider what activities are safe for you. Do not lift anything that is heavier than 10 lb (4.5 kg), or the limit that you are told, until your health care provider says that it is safe. Do not do activities that involve lifting your arms over your head. General instructions Do not use any products that contain nicotine or tobacco, such as cigarettes and e-cigarettes. These can delay healing. If you need help quitting, ask your health care provider. Keep all follow-up visits as told by your health care provider. This is important. Contact a health care provider if: You have more redness, swelling, or pain around your incision. You have more fluid or blood coming from your incision. Your incision feels warm to the touch. You have pus or a bad smell coming from your incision. You have pain that is not relieved by your pain medicine. Get help right away if you have: A fever or chills. Chest pain. Difficulty breathing. Summary After the procedure, it is common to have pain, soreness, swelling, or bruising near your incision. If you were prescribed an antibiotic medicine, take it as told by your health care provider. Do not stop taking the antibiotic even if you start to feel better. Do not drive for 24 hours if you were given a sedative during   your procedure. Return to your normal activities as told by your health care provider. Ask your health care provider what activities are safe for you. This information is not intended to replace advice given to you by your health care provider. Make sure you discuss any questions you have with your health care provider. Document Revised: 04/07/2017 Document Reviewed: 04/07/2017 Elsevier Patient  Education  2021 [employer] Inc[employer]   Moderate Conscious Sedation, Adult, Care After This sheet gives you information about how to care for yourself after your procedure. Your health care provider may also give you more specific instructions. If you have problems or questions, contact your health care provider. What can I expect after the procedure? After the procedure, it is common to have: Sleepiness for several hours. Impaired judgment for several hours. Difficulty with balance. Vomiting if you eat too soon. Follow these instructions at home: For the time period you were told by your health care provider: Rest. Do not participate in activities where you could fall or become injured. Do not drive or use machinery. Do not drink alcohol. Do not take sleeping pills or medicines that cause drowsiness. Do not make important decisions or sign legal documents. Do not take care of children on your own.        Eating and drinking Follow the diet recommended by your health care provider. Drink enough fluid to keep your urine pale yellow. If you vomit: Drink water, juice, or soup when you can drink without vomiting. Make sure you have little or no nausea before eating solid foods.    General instructions Take over-the-counter and prescription medicines only as told by your health care provider. Have a responsible adult stay with you for the time you are told. It is important to have someone help care for you until you are awake and alert. Do not smoke. Keep all follow-up visits as told by your health care provider. This is important. Contact a health care provider if: You are still sleepy or having trouble with balance after 24 hours. You feel light-headed. You keep feeling nauseous or you keep vomiting. You develop a rash. You have a fever. You have redness or swelling around the IV site. Get help right away if: You have trouble breathing. You have new-onset confusion at  home. Summary After the procedure, it is common to feel sleepy, have impaired judgment, or feel nauseous if you eat too soon. Rest after you get home. Know the things you should not do after the procedure. Follow the diet recommended by your health care provider and drink enough fluid to keep your urine pale yellow. Get help right away if you have trouble breathing or new-onset confusion at home. This information is not intended to replace advice given to you by your health care provider. Make sure you discuss any questions you have with your health care provider. Document Revised: 06/22/2019 Document Reviewed: 01/18/2019 Elsevier Patient Education  2021 Elsevier Inc.   

## 2020-03-27 IMAGING — DX DG CHEST 1V PORT
1 series · 1 of 1 positions shown · non-contrast
Comparison: CT 07/29/2017

CLINICAL DATA: Right middle lobe biopsy today

EXAM:
PORTABLE CHEST 1 VIEW

[chest ap]
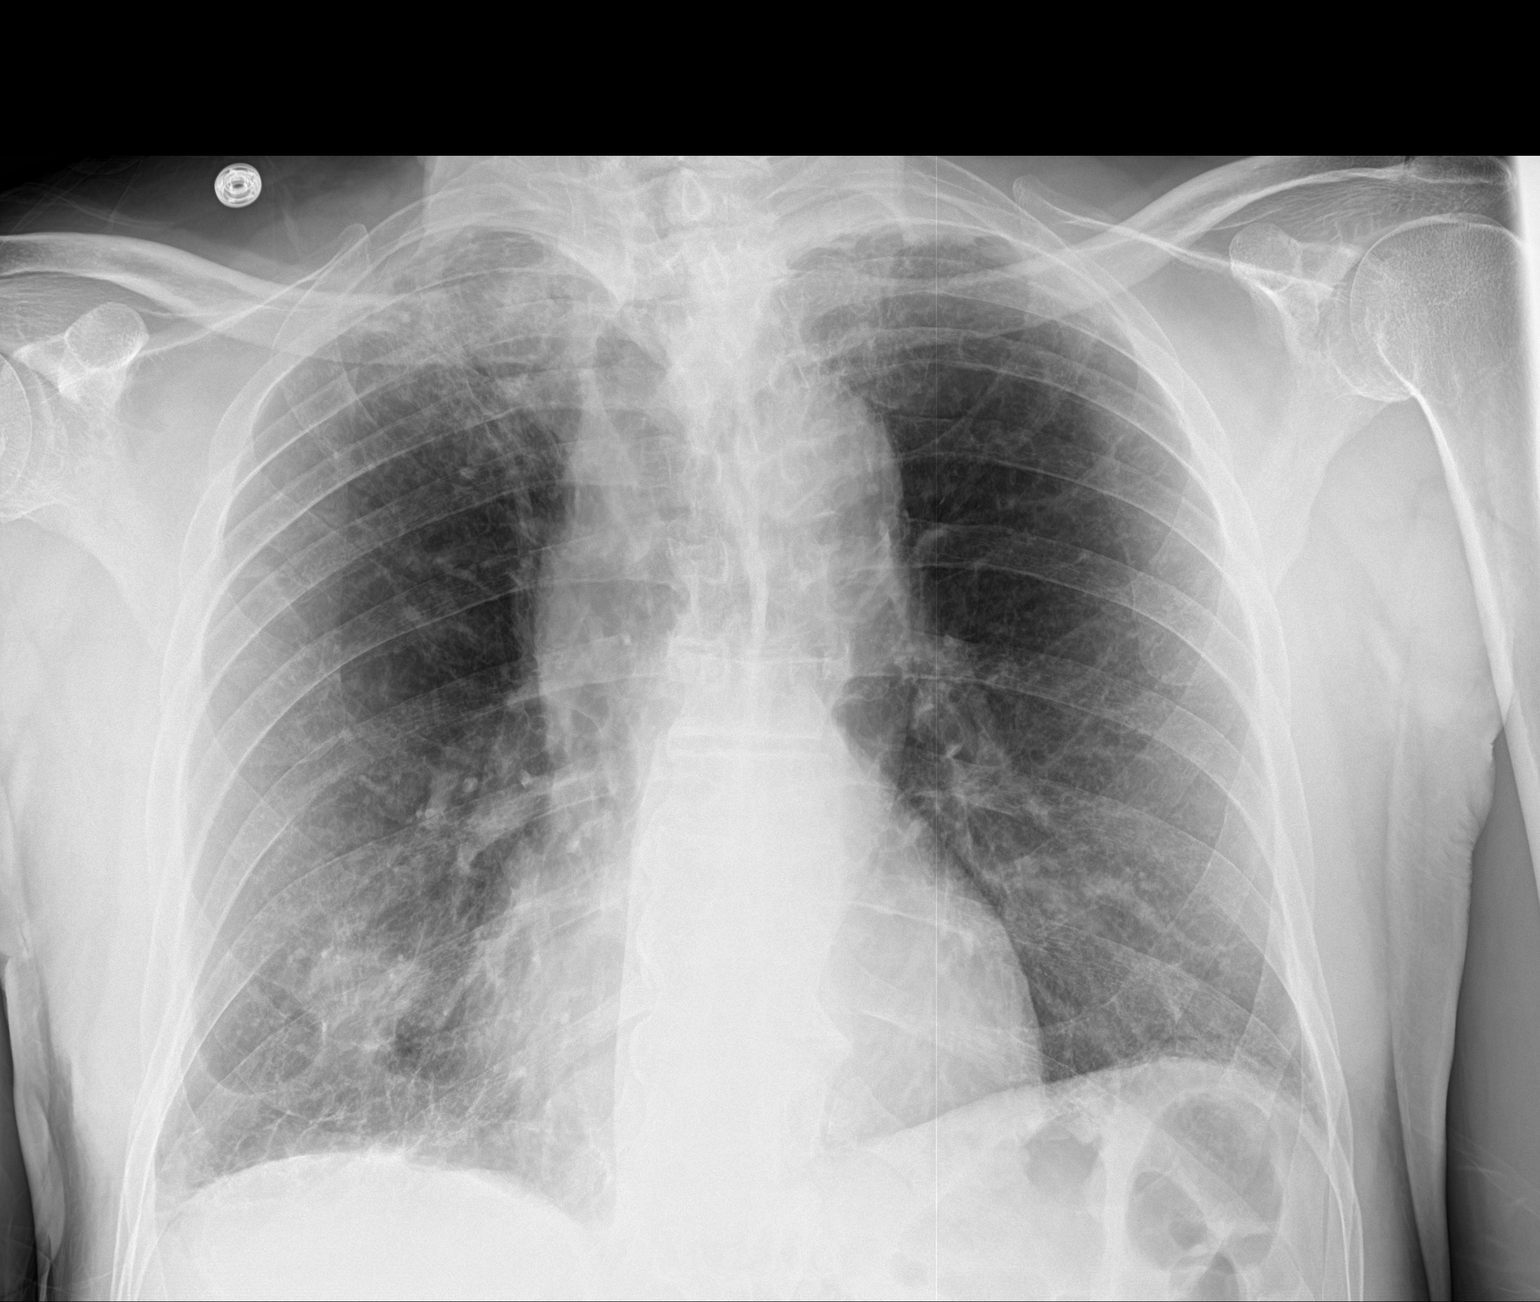

[1 of 1 positions shown; findings below may reference images not displayed]

FINDINGS: Right middle lobe mass lesion.  No pneumothorax post biopsy

COPD. Apical pleural and parenchymal scarring bilaterally. Small
right pleural effusion
IMPRESSION: Negative for pneumothorax post right middle lobe biopsy.

## 2020-04-28 ENCOUNTER — Other Ambulatory Visit: Payer: Self-pay | Admitting: Internal Medicine

## 2020-04-28 DIAGNOSIS — G620 Drug-induced polyneuropathy: Secondary | ICD-10-CM

## 2020-04-28 NOTE — Telephone Encounter (Signed)
Rx request 

## 2020-04-29 ENCOUNTER — Other Ambulatory Visit: Payer: Self-pay | Admitting: Internal Medicine

## 2020-04-29 NOTE — Telephone Encounter (Signed)
Rx request for review.

## 2020-05-07 ENCOUNTER — Telehealth: Payer: Self-pay

## 2020-05-07 NOTE — Telephone Encounter (Signed)
Pts wife called stating pt is c/o right side discomfort at his surgical site where his lung was removed. She states he has a mild itch sensation. Mrs. Weatherbee was able to get the pt in the phone so I can obtain additional information.  Pt states the same as the aforementioned and states this has been going on for about 3 weeks and is intermittent. He states he has had no fever, redness or swelling in the area.  I spoke with Dr. Julien Nordmann who advised it is not uncommon to have these issues intermittently but if he experiences any swelling, redness, change in pain or fever to call back. Pt and his wife have been advised of this and expressed understanding.  Of note, pt does have a follow-up appt scheduled for 08/26/20 but I did not see he has scheduled his CT scan. I advised them to please call Radiology to schedule the exam and the contact number of [phone] was provided. They expressed understanding of this direction.

## 2020-05-07 NOTE — Telephone Encounter (Signed)
er

## 2020-06-11 ENCOUNTER — Encounter (HOSPITAL_BASED_OUTPATIENT_CLINIC_OR_DEPARTMENT_OTHER): Payer: Medicare Other | Admitting: Internal Medicine

## 2020-06-29 ENCOUNTER — Encounter (HOSPITAL_BASED_OUTPATIENT_CLINIC_OR_DEPARTMENT_OTHER): Payer: Medicare Other | Admitting: Internal Medicine

## 2020-08-26 ENCOUNTER — Inpatient Hospital Stay: Payer: Medicare Other | Attending: Internal Medicine

## 2020-08-26 ENCOUNTER — Other Ambulatory Visit: Payer: Self-pay

## 2020-08-26 ENCOUNTER — Ambulatory Visit (HOSPITAL_COMMUNITY)
Admission: RE | Admit: 2020-08-26 | Discharge: 2020-08-26 | Disposition: A | Discharge status: Home / Self Care | Payer: Medicare Other | Source: Ambulatory Visit | Attending: Internal Medicine | Admitting: Internal Medicine

## 2020-08-26 DIAGNOSIS — E7800 Pure hypercholesterolemia, unspecified: Secondary | ICD-10-CM | POA: Insufficient documentation

## 2020-08-26 DIAGNOSIS — R079 Chest pain, unspecified: Secondary | ICD-10-CM | POA: Insufficient documentation

## 2020-08-26 DIAGNOSIS — Z7982 Long term (current) use of aspirin: Secondary | ICD-10-CM | POA: Insufficient documentation

## 2020-08-26 DIAGNOSIS — G620 Drug-induced polyneuropathy: Secondary | ICD-10-CM | POA: Diagnosis not present

## 2020-08-26 DIAGNOSIS — Z79899 Other long term (current) drug therapy: Secondary | ICD-10-CM | POA: Insufficient documentation

## 2020-08-26 DIAGNOSIS — J432 Centrilobular emphysema: Secondary | ICD-10-CM | POA: Insufficient documentation

## 2020-08-26 DIAGNOSIS — Z923 Personal history of irradiation: Secondary | ICD-10-CM | POA: Insufficient documentation

## 2020-08-26 DIAGNOSIS — C3490 Malignant neoplasm of unspecified part of unspecified bronchus or lung: Secondary | ICD-10-CM | POA: Insufficient documentation

## 2020-08-26 DIAGNOSIS — I700 Atherosclerosis of aorta: Secondary | ICD-10-CM | POA: Diagnosis not present

## 2020-08-26 DIAGNOSIS — T451X5A Adverse effect of antineoplastic and immunosuppressive drugs, initial encounter: Secondary | ICD-10-CM | POA: Insufficient documentation

## 2020-08-26 DIAGNOSIS — C3431 Malignant neoplasm of lower lobe, right bronchus or lung: Secondary | ICD-10-CM | POA: Insufficient documentation

## 2020-08-26 DIAGNOSIS — I2510 Atherosclerotic heart disease of native coronary artery without angina pectoris: Secondary | ICD-10-CM | POA: Diagnosis not present

## 2020-08-26 DIAGNOSIS — I10 Essential (primary) hypertension: Secondary | ICD-10-CM | POA: Diagnosis not present

## 2020-08-26 LAB — CMP (CANCER CENTER ONLY)
ALT: 15 U/L (ref 0–44)
AST: 26 U/L (ref 15–41)
Albumin: 3.7 g/dL (ref 3.5–5.0)
Alkaline Phosphatase: 72 U/L (ref 38–126)
Anion gap: 8 (ref 5–15)
BUN: 16 mg/dL (ref 8–23)
CO2: 25 mmol/L (ref 22–32)
Calcium: 10.1 mg/dL (ref 8.9–10.3)
Chloride: 107 mmol/L (ref 98–111)
Creatinine: 1.37 mg/dL — ABNORMAL HIGH (ref 0.61–1.24)
GFR, Estimated: 52 mL/min — ABNORMAL LOW (ref >60)
Glucose, Bld: 107 mg/dL — ABNORMAL HIGH (ref 70–99)
Potassium: 3.9 mmol/L (ref 3.5–5.1)
Sodium: 140 mmol/L (ref 135–145)
Total Bilirubin: 0.5 mg/dL (ref 0.3–1.2)
Total Protein: 8 g/dL (ref 6.5–8.1)

## 2020-08-26 LAB — CBC WITH DIFFERENTIAL (CANCER CENTER ONLY)
Abs Immature Granulocytes: 0.02 10*3/uL (ref 0.00–0.07)
Basophils Absolute: 0 10*3/uL (ref 0.0–0.1)
Basophils Relative: 0 %
Eosinophils Absolute: 0.2 10*3/uL (ref 0.0–0.5)
Eosinophils Relative: 4 %
HCT: 40.5 % (ref 39.0–52.0)
Hemoglobin: 13.4 g/dL (ref 13.0–17.0)
Immature Granulocytes: 0 %
Lymphocytes Relative: 29 %
Lymphs Abs: 1.6 10*3/uL (ref 0.7–4.0)
MCH: 31.6 pg (ref 26.0–34.0)
MCHC: 33.1 g/dL (ref 30.0–36.0)
MCV: 95.5 fL (ref 80.0–100.0)
Monocytes Absolute: 0.7 10*3/uL (ref 0.1–1.0)
Monocytes Relative: 13 %
Neutro Abs: 3 10*3/uL (ref 1.7–7.7)
Neutrophils Relative %: 54 %
Platelet Count: 118 10*3/uL — ABNORMAL LOW (ref 150–400)
RBC: 4.24 MIL/uL (ref 4.22–5.81)
RDW: 13.3 % (ref 11.5–15.5)
WBC Count: 5.5 10*3/uL (ref 4.0–10.5)
nRBC: 0 % (ref 0.0–0.2)

## 2020-08-28 ENCOUNTER — Telehealth: Payer: Self-pay | Admitting: Internal Medicine

## 2020-08-28 ENCOUNTER — Telehealth: Payer: Self-pay

## 2020-08-28 NOTE — Telephone Encounter (Signed)
Confirmed with pt's spouse pt's appt change for 08/29/20. Pt's wife verbalizes understanding and agrees with plan of care.

## 2020-08-28 NOTE — Telephone Encounter (Signed)
R/s 6/27 appt per provider request and moved it to 6/24. Called and left msg

## 2020-08-29 ENCOUNTER — Other Ambulatory Visit: Payer: Self-pay

## 2020-08-29 ENCOUNTER — Inpatient Hospital Stay: Payer: Medicare Other | Admitting: Internal Medicine

## 2020-08-29 VITALS — BP 113/63 | HR 67 | Temp 98.5°F | Resp 17 | Wt 172.1 lb

## 2020-08-29 DIAGNOSIS — C3490 Malignant neoplasm of unspecified part of unspecified bronchus or lung: Secondary | ICD-10-CM | POA: Diagnosis not present

## 2020-08-29 DIAGNOSIS — C3491 Malignant neoplasm of unspecified part of right bronchus or lung: Secondary | ICD-10-CM

## 2020-08-29 DIAGNOSIS — C3431 Malignant neoplasm of lower lobe, right bronchus or lung: Secondary | ICD-10-CM | POA: Diagnosis not present

## 2020-08-29 NOTE — Progress Notes (Signed)
[facility] Telephone:([phone]) [phone]   Fax:(336) [phone]  OFFICE PROGRESS NOTE  Carylon Perches, NP [address] [place] [address]  DIAGNOSIS: Recurrent non-small cell lung cancer, squamous cell carcinoma presented with right lower lobe lung nodule in August 2021 initially diagnosed as stage IIB (T3, N0, M0) non-small cell lung cancer, invasive well-differentiated squamous cell carcinoma presented with right middle lobe lung mass.  PRIOR THERAPY:  1) Status post right VATS, right middle lobectomy with mediastinal lymph node sampling under the care of Dr. Roxan Hockey on 09/19/2017.  The tumor measured 4.2 cm but the carcinoma extends through the visceral pleura. 2) Adjuvant systemic chemotherapy with carboplatin for AUC of 6 and paclitaxel 200 mg/M2 every 3 weeks.  First dose 11/16/2017.  Status post 3 cycles.  Last dose was giving January 05, 2018 discontinued secondary to intolerance with significant peripheral neuropathy. 3) curative radiotherapy to the right lower lobe pulmonary nodule under the care of Dr. Tammi Klippel completed on December 21, 2019.  CURRENT THERAPY: Observation.  INTERVAL HISTORY: Danny Duke 85 y.o. male returns to the clinic today for 62-month follow-up visit accompanied by his wife.  The patient is feeling fine today with no concerning complaints except for the occasional right-sided chest pain from the previous surgical scar.  The patient denied having any shortness of breath, cough or hemoptysis.  He denied having any fever or chills.  He has no nausea, vomiting, diarrhea or constipation.  He has no headache or visual changes.  He is here today for evaluation and repeat CT scan of the chest for restaging of his disease.  MEDICAL HISTORY: Past Medical History:  Diagnosis Date   Chronic back pain    Hypercholesterolemia    Hypertension    lung ca dx'd 09/2017   possible lung cancer    ALLERGIES:  has No Known Allergies.  MEDICATIONS:   Current Outpatient Medications  Medication Sig Dispense Refill   acetaminophen (TYLENOL) 500 MG tablet Take 1,000 mg by mouth every 6 (six) hours as needed for moderate pain or headache.     amLODipine (NORVASC) 10 MG tablet Take 10 mg by mouth daily.     aspirin EC 81 MG tablet Take 81 mg by mouth daily.     chlorproMAZINE (THORAZINE) 25 MG tablet Take 1 tablet (25 mg total) by mouth 3 (three) times daily as needed. 30 tablet 0   DULoxetine (CYMBALTA) 60 MG capsule TAKE 1 CAPSULE(60 MG) BY MOUTH DAILY 30 capsule 3   famotidine (PEPCID) 20 MG tablet Take 20 mg by mouth daily as needed for indigestion.     gabapentin (NEURONTIN) 300 MG capsule TAKE 3 CAPSULES(900 MG) BY MOUTH THREE TIMES DAILY 120 capsule 6   losartan (COZAAR) 25 MG tablet Take 25 mg by mouth daily.      metoprolol succinate (TOPROL-XL) 25 MG 24 hr tablet Take 1 tablet (25 mg total) by mouth daily. 60 tablet 1   naloxone (NARCAN) nasal spray 4 mg/0.1 mL      Oxycodone HCl 20 MG TABS Take 20 mg by mouth every 6 (six) hours as needed (pain).   0   simvastatin (ZOCOR) 20 MG tablet Take 20 mg by mouth daily.     Vitamin D, Ergocalciferol, (DRISDOL) 1.25 MG (50000 UNIT) CAPS capsule Take 50,000 Units by mouth once a week.     No current facility-administered medications for this visit.    SURGICAL HISTORY:  Past Surgical History:  Procedure Laterality Date  BRONCHIAL NEEDLE ASPIRATION BIOPSY  08/05/2017   Procedure: BRONCHIAL NEEDLE ASPIRATION BIOPSIES;  Surgeon: Juanito Doom, MD;  Location: [facility];  Service: Cardiopulmonary;;   ENDOBRONCHIAL ULTRASOUND Bilateral 08/05/2017   Procedure: ENDOBRONCHIAL ULTRASOUND;  Surgeon: Juanito Doom, MD;  Location: [facility];  Service: Cardiopulmonary;  Laterality: Bilateral;   IR IMAGING GUIDED PORT INSERTION  12/27/2017   IR REMOVAL TUN ACCESS W/ PORT W/O FL MOD SED  03/25/2020   MEDIASTINOSCOPY N/A 09/19/2017   Procedure: MEDIASTINOSCOPY;  Surgeon: [name], MD;  Location: [facility];  Service: Thoracic;  Laterality: N/A;   MULTIPLE TOOTH EXTRACTIONS     [place] (VATS)/ LOBECTOMY Right 09/19/2017   Procedure: RIGHT VIDEO ASSISTED THORACOSCOPY (VATS)/ LOBECTOMY;  Surgeon: [name], MD;  Location: [facility];  Service: Thoracic;  Laterality: Right;    REVIEW OF SYSTEMS:  A comprehensive review of systems was negative except for: Respiratory: positive for pleurisy/chest pain   PHYSICAL EXAMINATION: General appearance: alert, cooperative, and no distress Head: Normocephalic, without obvious abnormality, atraumatic Neck: no adenopathy, no JVD, supple, symmetrical, trachea midline, and thyroid not enlarged, symmetric, no tenderness/mass/nodules Lymph nodes: Cervical, supraclavicular, and axillary nodes normal. Resp: clear to auscultation bilaterally Back: symmetric, no curvature. ROM normal. No CVA tenderness. Cardio: regular rate and rhythm, S1, S2 normal, no murmur, click, rub or gallop GI: soft, non-tender; bowel sounds normal; no masses,  no organomegaly Extremities: extremities normal, atraumatic, no cyanosis or edema  ECOG PERFORMANCE STATUS: 1 - Symptomatic but completely ambulatory  Blood pressure 113/63, pulse 67, temperature 98.5 F (36.9 C), temperature source Oral, resp. rate 17, weight 172 lb 1.6 oz (78.1 kg), SpO2 100 %.  LABORATORY DATA: Lab Results  Component Value Date   WBC 5.5 08/26/2020   HGB 13.4 08/26/2020   HCT 40.5 08/26/2020   MCV 95.5 08/26/2020   PLT 118 (L) 08/26/2020      Chemistry      Component Value Date/Time   NA 140 08/26/2020 1506   K 3.9 08/26/2020 1506   CL 107 08/26/2020 1506   CO2 25 08/26/2020 1506   BUN 16 08/26/2020 1506   CREATININE 1.37 (H) 08/26/2020 1506      Component Value Date/Time   CALCIUM 10.1 08/26/2020 1506   ALKPHOS 72 08/26/2020 1506   AST 26 08/26/2020 1506   ALT 15 08/26/2020 1506   BILITOT 0.5 08/26/2020 1506       RADIOGRAPHIC STUDIES: CT  Chest Wo Contrast  Result Date: 08/27/2020 CLINICAL DATA:  Primary Cancer Type: Lung Imaging Indication: Routine surveillance Interval therapy since last imaging? No Initial Cancer Diagnosis Date: 07/29/2017; Established by: Biopsy-proven Detailed Pathology: Stage IIB non-small cell lung cancer, invasive well-differentiated squamous cell carcinoma. Primary Tumor location: Right middle lobe. Recurrence? Yes; Date(s) of recurrence: 10/22/2019; Established by: Biopsy-proven Surgeries: Right middle lobectomy 09/19/2017. Chemotherapy: Yes; Ongoing? No; Most recent administration: 01/05/2018 Immunotherapy? No Radiation therapy? Yes; Date Range: 12/10/2019 - 12/21/2019; Target: Right lower lobe EXAM: CT CHEST WITHOUT CONTRAST TECHNIQUE: Multidetector CT imaging of the chest was performed following the standard protocol without IV contrast. COMPARISON:  Most recent CT chest 02/25/2020.  09/27/2019 PET-CT. FINDINGS: Cardiovascular: Aortic atherosclerosis. Normal heart size. Left and right coronary artery calcifications. No pericardial effusion. Mediastinum/Nodes: Numerous enlarged mediastinal and bilateral hilar lymph nodes, many coarsely calcified. Thyroid gland, trachea, and esophagus demonstrate no significant findings. Lungs/Pleura: Redemonstrated postoperative findings of right middle lobectomy. Severe centrilobular and paraseptal emphysema. Unchanged, post treatment appearance of a spiculated subpleural lesion  of the dependent right lower lobe measuring approximately 2.0 x 1.1 cm (series 7, image 67). Unchanged irregular nodularity and pleuroparenchymal scarring of the lung apices. No pleural effusion or pneumothorax. Upper Abdomen: No acute abnormality. Musculoskeletal: No chest wall mass or suspicious bone lesions identified. IMPRESSION: 1. Unchanged, post treatment appearance of a spiculated subpleural lesion of the dependent right lower lobe measuring approximately 2.0 x 1.1 cm, this lesion reflecting a biopsy  proven squamous cell malignancy. 2. Redemonstrated postoperative findings of right middle lobectomy. 3. Biapical nodularity and pleuroparenchymal scarring, benign and infectious or inflammatory in nature. 4. Calcified mediastinal lymph nodes, likely related to prior granulomatous infection or alternately nodal sarcoidosis. 5. Severe emphysema. 6. Coronary artery disease. Aortic Atherosclerosis (ICD10-I70.0) and Emphysema (ICD10-J43.9). Electronically Signed   By: Eddie Candle M.D.   On: 08/27/2020 10:39     ASSESSMENT AND PLAN: This is a very pleasant 85 years old African-American male with likely recurrent lung cancer initially diagnosed as stage IIB (T3, N0, M0) invasive well-differentiated squamous cell carcinoma presented with right middle lobe lung mass status post right middle lobectomy with lymph node sampling on September 19, 2017 under the care of Dr. Roxan Hockey. The patient underwent adjuvant treatment with systemic chemotherapy with carboplatin for AUC of 6 and paclitaxel 200 mg/M2 every 3 weeks with Neulasta support status post 3 cycles. He tolerated this treatment well except for the chemotherapy-induced peripheral neuropathy and he discontinued his treatment after cycle #3. A recent CT scan of the chest showed further increase in the previously identified subpleural nodule in the right lower lobe.  This is suspicious for metastatic or metachronous malignancy.  The patient had a PET scan that showed hypermetabolic activity and a new pulmonary nodule in the posterior right chest along the pleura suspicious for disease recurrence.  There was also new right thoracic inlet lymph node raising the question of metastatic involvement as well as numerous hypermetabolic lymph nodes in the mediastinum and hila. 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 He underwent CT-guided core biopsy of the right lower lobe  lung nodule and the final pathology was consistent with squamous cell carcinoma. He underwent SBRT to the right lower lobe lung nodule by Dr. Tammi Klippel and tolerated the procedure fairly well. The patient is currently on observation and he is feeling fine today with no concerning complaints except for the intermittent right-sided chest pain from the surgical scar. He had repeat CT scan of the chest performed recently.  I personally and independently reviewed the scans and discussed the results with the patient and his wife. Has a scan showed no concerning findings for disease progression. I recommended for him to continue on observation with repeat CT scan of the chest in 6 months. He was advised to call immediately if he has any concerning symptoms in the interval. The patient voices understanding of current disease status and treatment options and is in agreement with the current care plan.  All questions were answered. The patient knows to call the clinic with any problems, questions or concerns. We can certainly see the patient much sooner if necessary.  Disclaimer: This note was dictated with voice recognition software. Similar sounding words can inadvertently be transcribed and may not be corrected upon review.

## 2020-09-01 ENCOUNTER — Ambulatory Visit: Payer: Medicare Other | Admitting: Internal Medicine

## 2020-09-02 ENCOUNTER — Telehealth: Payer: Self-pay | Admitting: Internal Medicine

## 2020-09-02 NOTE — Telephone Encounter (Signed)
Scheduled per los. Called and left msg. Mailed printout  °

## 2020-09-29 ENCOUNTER — Other Ambulatory Visit: Payer: Self-pay | Admitting: Internal Medicine

## 2020-10-06 ENCOUNTER — Other Ambulatory Visit: Payer: Self-pay | Admitting: Internal Medicine

## 2020-10-06 DIAGNOSIS — G620 Drug-induced polyneuropathy: Secondary | ICD-10-CM

## 2021-01-30 ENCOUNTER — Other Ambulatory Visit: Payer: Self-pay | Admitting: Internal Medicine

## 2021-02-24 ENCOUNTER — Other Ambulatory Visit: Payer: Self-pay

## 2021-02-24 ENCOUNTER — Inpatient Hospital Stay: Payer: Medicare Other | Attending: Internal Medicine

## 2021-02-24 ENCOUNTER — Ambulatory Visit (HOSPITAL_COMMUNITY)
Admission: RE | Admit: 2021-02-24 | Discharge: 2021-02-24 | Disposition: A | Discharge status: Home / Self Care | Payer: Medicare Other | Source: Ambulatory Visit | Attending: Internal Medicine | Admitting: Internal Medicine

## 2021-02-24 DIAGNOSIS — I2510 Atherosclerotic heart disease of native coronary artery without angina pectoris: Secondary | ICD-10-CM | POA: Insufficient documentation

## 2021-02-24 DIAGNOSIS — J432 Centrilobular emphysema: Secondary | ICD-10-CM | POA: Insufficient documentation

## 2021-02-24 DIAGNOSIS — Z7982 Long term (current) use of aspirin: Secondary | ICD-10-CM | POA: Insufficient documentation

## 2021-02-24 DIAGNOSIS — E7800 Pure hypercholesterolemia, unspecified: Secondary | ICD-10-CM | POA: Insufficient documentation

## 2021-02-24 DIAGNOSIS — G620 Drug-induced polyneuropathy: Secondary | ICD-10-CM | POA: Insufficient documentation

## 2021-02-24 DIAGNOSIS — C3431 Malignant neoplasm of lower lobe, right bronchus or lung: Secondary | ICD-10-CM | POA: Insufficient documentation

## 2021-02-24 DIAGNOSIS — C3490 Malignant neoplasm of unspecified part of unspecified bronchus or lung: Secondary | ICD-10-CM | POA: Insufficient documentation

## 2021-02-24 DIAGNOSIS — Z79899 Other long term (current) drug therapy: Secondary | ICD-10-CM | POA: Insufficient documentation

## 2021-02-24 DIAGNOSIS — I10 Essential (primary) hypertension: Secondary | ICD-10-CM | POA: Insufficient documentation

## 2021-02-24 DIAGNOSIS — T451X5A Adverse effect of antineoplastic and immunosuppressive drugs, initial encounter: Secondary | ICD-10-CM | POA: Insufficient documentation

## 2021-02-24 LAB — CBC WITH DIFFERENTIAL (CANCER CENTER ONLY)
Abs Immature Granulocytes: 0.01 10*3/uL (ref 0.00–0.07)
Basophils Absolute: 0 10*3/uL (ref 0.0–0.1)
Basophils Relative: 0 %
Eosinophils Absolute: 0.2 10*3/uL (ref 0.0–0.5)
Eosinophils Relative: 3 %
HCT: 40.9 % (ref 39.0–52.0)
Hemoglobin: 13.4 g/dL (ref 13.0–17.0)
Immature Granulocytes: 0 %
Lymphocytes Relative: 28 %
Lymphs Abs: 1.6 10*3/uL (ref 0.7–4.0)
MCH: 31.8 pg (ref 26.0–34.0)
MCHC: 32.8 g/dL (ref 30.0–36.0)
MCV: 96.9 fL (ref 80.0–100.0)
Monocytes Absolute: 0.5 10*3/uL (ref 0.1–1.0)
Monocytes Relative: 10 %
Neutro Abs: 3.3 10*3/uL (ref 1.7–7.7)
Neutrophils Relative %: 59 %
Platelet Count: 107 10*3/uL — ABNORMAL LOW (ref 150–400)
RBC: 4.22 MIL/uL (ref 4.22–5.81)
RDW: 13.2 % (ref 11.5–15.5)
WBC Count: 5.6 10*3/uL (ref 4.0–10.5)
nRBC: 0 % (ref 0.0–0.2)

## 2021-02-24 LAB — CMP (CANCER CENTER ONLY)
ALT: 15 U/L (ref 0–44)
AST: 23 U/L (ref 15–41)
Albumin: 3.9 g/dL (ref 3.5–5.0)
Alkaline Phosphatase: 75 U/L (ref 38–126)
Anion gap: 6 (ref 5–15)
BUN: 15 mg/dL (ref 8–23)
CO2: 27 mmol/L (ref 22–32)
Calcium: 9.8 mg/dL (ref 8.9–10.3)
Chloride: 107 mmol/L (ref 98–111)
Creatinine: 1.33 mg/dL — ABNORMAL HIGH (ref 0.61–1.24)
GFR, Estimated: 53 mL/min — ABNORMAL LOW (ref >60)
Glucose, Bld: 96 mg/dL (ref 70–99)
Potassium: 4.3 mmol/L (ref 3.5–5.1)
Sodium: 140 mmol/L (ref 135–145)
Total Bilirubin: 0.6 mg/dL (ref 0.3–1.2)
Total Protein: 7.9 g/dL (ref 6.5–8.1)

## 2021-02-26 ENCOUNTER — Other Ambulatory Visit: Payer: Self-pay

## 2021-02-26 ENCOUNTER — Inpatient Hospital Stay: Payer: Medicare Other | Admitting: Internal Medicine

## 2021-02-26 VITALS — BP 118/99 | HR 70 | Temp 97.5°F | Resp 18 | Ht 68.0 in | Wt 173.4 lb

## 2021-02-26 DIAGNOSIS — E7800 Pure hypercholesterolemia, unspecified: Secondary | ICD-10-CM | POA: Diagnosis not present

## 2021-02-26 DIAGNOSIS — G620 Drug-induced polyneuropathy: Secondary | ICD-10-CM | POA: Diagnosis not present

## 2021-02-26 DIAGNOSIS — T451X5A Adverse effect of antineoplastic and immunosuppressive drugs, initial encounter: Secondary | ICD-10-CM | POA: Diagnosis not present

## 2021-02-26 DIAGNOSIS — Z5111 Encounter for antineoplastic chemotherapy: Secondary | ICD-10-CM

## 2021-02-26 DIAGNOSIS — C3491 Malignant neoplasm of unspecified part of right bronchus or lung: Secondary | ICD-10-CM

## 2021-02-26 DIAGNOSIS — Z79899 Other long term (current) drug therapy: Secondary | ICD-10-CM | POA: Diagnosis not present

## 2021-02-26 DIAGNOSIS — I10 Essential (primary) hypertension: Secondary | ICD-10-CM | POA: Diagnosis not present

## 2021-02-26 DIAGNOSIS — C3490 Malignant neoplasm of unspecified part of unspecified bronchus or lung: Secondary | ICD-10-CM

## 2021-02-26 DIAGNOSIS — J432 Centrilobular emphysema: Secondary | ICD-10-CM | POA: Diagnosis not present

## 2021-02-26 DIAGNOSIS — C3431 Malignant neoplasm of lower lobe, right bronchus or lung: Secondary | ICD-10-CM | POA: Diagnosis not present

## 2021-02-26 DIAGNOSIS — I2510 Atherosclerotic heart disease of native coronary artery without angina pectoris: Secondary | ICD-10-CM | POA: Diagnosis not present

## 2021-02-26 DIAGNOSIS — Z7982 Long term (current) use of aspirin: Secondary | ICD-10-CM | POA: Diagnosis not present

## 2021-02-26 NOTE — Progress Notes (Signed)
[place] [phone]   [phone]  OFFICE PROGRESS NOTE  Danny Perches, NP [address] [place] [address]  DIAGNOSIS: Recurrent non-small cell lung cancer, squamous cell carcinoma presented with right lower lobe lung nodule in August 2021 initially diagnosed as stage IIB (T3, N0, M0) non-small cell lung cancer, invasive well-differentiated squamous cell carcinoma presented with right middle lobe lung mass.  PRIOR THERAPY:  1) Status post right VATS, right middle lobectomy with mediastinal lymph node sampling under the care of Dr. Roxan Hockey on 09/19/2017.  The tumor measured 4.2 cm but the carcinoma extends through the visceral pleura. 2) Adjuvant systemic chemotherapy with carboplatin for AUC of 6 and paclitaxel 200 mg/M2 every 3 weeks.  First dose 11/16/2017.  Status post 3 cycles.  Last dose was giving January 05, 2018 discontinued secondary to intolerance with significant peripheral neuropathy. 3) curative radiotherapy to the right lower lobe pulmonary nodule under the care of Dr. Tammi Klippel completed on December 21, 2019.  CURRENT THERAPY: Observation.  INTERVAL HISTORY: Danny Duke 85 y.o. male returns to the clinic today for 38-month follow-up visit accompanied by his wife.  The patient is feeling fine today with no concerning complaints.  He denied having any current chest pain, shortness of breath, cough or hemoptysis.  He denied having any fever or chills.  He has no nausea, vomiting, diarrhea or constipation.  He has no headache or visual changes.  He denied having any recent weight loss or night sweats.  He had repeat CT scan of the chest performed recently and he is here for evaluation and discussion of his scan results.  MEDICAL HISTORY: Past Medical History:  Diagnosis Date   Chronic back pain    Hypercholesterolemia    Hypertension    lung ca dx'd 09/2017   possible lung cancer    ALLERGIES:  has No Known  Allergies.  MEDICATIONS:  Current Outpatient Medications  Medication Sig Dispense Refill   acetaminophen (TYLENOL) 500 MG tablet Take 1,000 mg by mouth every 6 (six) hours as needed for moderate pain or headache.     amLODipine (NORVASC) 10 MG tablet Take 10 mg by mouth daily.     aspirin EC 81 MG tablet Take 81 mg by mouth daily.     chlorproMAZINE (THORAZINE) 25 MG tablet Take 1 tablet (25 mg total) by mouth 3 (three) times daily as needed. 30 tablet 0   DULoxetine (CYMBALTA) 60 MG capsule TAKE 1 CAPSULE(60 MG) BY MOUTH DAILY 30 capsule 3   famotidine (PEPCID) 20 MG tablet Take 20 mg by mouth daily as needed for indigestion.     gabapentin (NEURONTIN) 300 MG capsule TAKE 3 CAPSULES(900 MG) BY MOUTH THREE TIMES DAILY 120 capsule 6   gabapentin (NEURONTIN) 300 MG capsule TAKE 3 CAPSULES(900 MG) BY MOUTH THREE TIMES DAILY 120 capsule 6   gabapentin (NEURONTIN) 300 MG capsule TAKE 3 CAPSULES(900 MG) BY MOUTH THREE TIMES DAILY 120 capsule 6   losartan (COZAAR) 25 MG tablet Take 25 mg by mouth daily.      metoprolol succinate (TOPROL-XL) 25 MG 24 hr tablet Take 1 tablet (25 mg total) by mouth daily. 60 tablet 1   naloxone (NARCAN) nasal spray 4 mg/0.1 mL      Oxycodone HCl 20 MG TABS Take 20 mg by mouth every 6 (six) hours as needed (pain).   0   simvastatin (ZOCOR) 20 MG tablet Take 20 mg by mouth daily.  Vitamin D, Ergocalciferol, (DRISDOL) 1.25 MG (50000 UNIT) CAPS capsule Take 50,000 Units by mouth once a week.     No current facility-administered medications for this visit.    SURGICAL HISTORY:  Past Surgical History:  Procedure Laterality Date   BRONCHIAL NEEDLE ASPIRATION BIOPSY  08/05/2017   Procedure: BRONCHIAL NEEDLE ASPIRATION BIOPSIES;  Surgeon: Juanito Doom, MD;  Location: [facility];  Service: Cardiopulmonary;;   ENDOBRONCHIAL ULTRASOUND Bilateral 08/05/2017   Procedure: ENDOBRONCHIAL ULTRASOUND;  Surgeon: Juanito Doom, MD;  Location: [facility];  Service:  Cardiopulmonary;  Laterality: Bilateral;   IR IMAGING GUIDED PORT INSERTION  12/27/2017   IR REMOVAL TUN ACCESS W/ PORT W/O FL MOD SED  03/25/2020   MEDIASTINOSCOPY N/A 09/19/2017   Procedure: MEDIASTINOSCOPY;  Surgeon: [name], MD;  Location: [facility];  Service: Thoracic;  Laterality: N/A;   MULTIPLE TOOTH EXTRACTIONS     [place] (VATS)/ LOBECTOMY Right 09/19/2017   Procedure: RIGHT VIDEO ASSISTED THORACOSCOPY (VATS)/ LOBECTOMY;  Surgeon: [name], MD;  Location: [facility];  Service: Thoracic;  Laterality: Right;    REVIEW OF SYSTEMS:  A comprehensive review of systems was negative.   PHYSICAL EXAMINATION: General appearance: alert, cooperative, and no distress Head: Normocephalic, without obvious abnormality, atraumatic Neck: no adenopathy, no JVD, supple, symmetrical, trachea midline, and thyroid not enlarged, symmetric, no tenderness/mass/nodules Lymph nodes: Cervical, supraclavicular, and axillary nodes normal. Resp: clear to auscultation bilaterally Back: symmetric, no curvature. ROM normal. No CVA tenderness. Cardio: regular rate and rhythm, S1, S2 normal, no murmur, click, rub or gallop GI: soft, non-tender; bowel sounds normal; no masses,  no organomegaly Extremities: extremities normal, atraumatic, no cyanosis or edema  ECOG PERFORMANCE STATUS: 1 - Symptomatic but completely ambulatory  Blood pressure (!) 118/99, pulse 70, temperature (!) 97.5 F (36.4 C), temperature source Tympanic, resp. rate 18, height 5\' 8"  (1.727 m), weight 173 lb 6 oz (78.6 kg), SpO2 99 %.  LABORATORY DATA: Lab Results  Component Value Date   WBC 5.6 02/24/2021   HGB 13.4 02/24/2021   HCT 40.9 02/24/2021   MCV 96.9 02/24/2021   PLT 107 (L) 02/24/2021      Chemistry      Component Value Date/Time   NA 140 02/24/2021 1128   K 4.3 02/24/2021 1128   CL 107 02/24/2021 1128   CO2 27 02/24/2021 1128   BUN 15 02/24/2021 1128   CREATININE 1.33 (H) 02/24/2021  1128      Component Value Date/Time   CALCIUM 9.8 02/24/2021 1128   ALKPHOS 75 02/24/2021 1128   AST 23 02/24/2021 1128   ALT 15 02/24/2021 1128   BILITOT 0.6 02/24/2021 1128       RADIOGRAPHIC STUDIES: CT Chest Wo Contrast  Result Date: 02/25/2021 CLINICAL DATA:  Non-small cell lung cancer staging EXAM: CT CHEST WITHOUT CONTRAST TECHNIQUE: Multidetector CT imaging of the chest was performed following the standard protocol without IV contrast. COMPARISON:  CT chest dated August 26, 2020 FINDINGS: Cardiovascular: Normal heart size. No pericardial effusion. Atherosclerotic disease of the thoracic aorta. Three-vessel coronary artery calcifications. Mediastinum/Nodes: Patulous esophagus. Normal thyroid. Calcified mediastinal and hilar lymph nodes. Enlarged right lower paratracheal lymph node measuring 1.5 cm in short axis, unchanged compared to prior exam. Lungs/Pleura: Central airways are patent. Severe centrilobular emphysema. Prior right middle lobectomy. Spiculated subpleural nodule the right lower lobe measures approximately 1.4 x 1.3 cm on series 8, image 56, unchanged in size when compared with prior exam and remeasured in similar plane.  Biapical pleuroparenchymal scarring with associated nodularity, unchanged compared to prior exam. Upper Abdomen: Numerous sclerotic low-attenuation liver lesions are unchanged when compared with prior exam, too small to completely characterize but likely simple cysts. Partially imaged low-attenuation lesion of the left kidney, likely a simple cyst. No acute abnormality. Musculoskeletal: No chest wall mass or suspicious bone lesions identified. IMPRESSION: 1. Stable post treatment appearance of spiculated subpleural nodule of the right lower lobe. 2. Prior right middle lobectomy with no evidence of recurrent disease. 3. Unchanged biapical nodularity, no new or enlarging pulmonary nodules. 4. Coronary artery calcifications. 5. Aortic Atherosclerosis (ICD10-I70.0) and  Emphysema (ICD10-J43.9). Electronically Signed   By: Yetta Glassman M.D.   On: 02/25/2021 10:20     ASSESSMENT AND PLAN: This is a very pleasant 85 years old African-American male with likely recurrent lung cancer initially diagnosed as stage IIB (T3, N0, M0) invasive well-differentiated squamous cell carcinoma presented with right middle lobe lung mass status post right middle lobectomy with lymph node sampling on September 19, 2017 under the care of Dr. Roxan Hockey. The patient underwent adjuvant treatment with systemic chemotherapy with carboplatin for AUC of 6 and paclitaxel 200 mg/M2 every 3 weeks with Neulasta support status post 3 cycles. He tolerated this treatment well except for the chemotherapy-induced peripheral neuropathy and he discontinued his treatment after cycle #3. A recent CT scan of the chest showed further increase in the previously identified subpleural nodule in the right lower lobe.  This is suspicious for metastatic or metachronous malignancy.  The patient had a PET scan that showed hypermetabolic activity and a new pulmonary nodule in the posterior right chest along the pleura suspicious for disease recurrence.  There was also new right thoracic inlet lymph node raising the question of metastatic involvement as well as numerous hypermetabolic lymph nodes in the mediastinum and hila. 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 He underwent CT-guided core biopsy of the right lower lobe lung nodule and the final pathology was consistent with squamous cell carcinoma. He underwent SBRT to the right lower lobe lung nodule by Dr. Tammi Klippel and tolerated the procedure fairly well. He is currently on observation and doing fine with no concerning complaints. The patient had repeat CT scan of the chest performed recently.  I personally and independently reviewed the scans and discussed the results  with the patient and his wife.  His scan showed no concerning findings for disease recurrence or metastasis. I recommended for the patient to continue on observation with repeat CT scan of the chest in 6 months. He was advised to call immediately if he has any other concerning symptoms in the interval. The patient voices understanding of current disease status and treatment options and is in agreement with the current care plan.  All questions were answered. The patient knows to call the clinic with any problems, questions or concerns. We can certainly see the patient much sooner if necessary.  Disclaimer: This note was dictated with voice recognition software. Similar sounding words can inadvertently be transcribed and may not be corrected upon review.

## 2021-03-09 ENCOUNTER — Other Ambulatory Visit: Payer: Self-pay | Admitting: Internal Medicine

## 2021-03-09 DIAGNOSIS — G620 Drug-induced polyneuropathy: Secondary | ICD-10-CM

## 2021-06-04 ENCOUNTER — Other Ambulatory Visit: Payer: Self-pay | Admitting: Internal Medicine

## 2021-07-21 ENCOUNTER — Telehealth: Payer: Self-pay | Admitting: Physician Assistant

## 2021-07-21 NOTE — Telephone Encounter (Signed)
Rescheduled 06/22 appointment to 06/21, called and patient is notified.  ?

## 2021-08-21 NOTE — Progress Notes (Unsigned)
[facility] OFFICE PROGRESS NOTE  Carylon Perches, NP [address] [place] [address]  DIAGNOSIS:  Recurrent non-small cell lung cancer, squamous cell carcinoma presented with right lower lobe lung nodule in August 2021 initially diagnosed as stage IIB (T3, N0, M0) non-small cell lung cancer, invasive well-differentiated squamous cell carcinoma presented with right middle lobe lung mass.  PRIOR THERAPY: 1) Status post right VATS, right middle lobectomy with mediastinal lymph node sampling under the care of Dr. Roxan Hockey on 09/19/2017.  The tumor measured 4.2 cm but the carcinoma extends through the visceral pleura. 2) Adjuvant systemic chemotherapy with carboplatin for AUC of 6 and paclitaxel 200 mg/M2 every 3 weeks.  First dose 11/16/2017.  Status post 3 cycles.  Last dose was giving January 05, 2018 discontinued secondary to intolerance with significant peripheral neuropathy. 3) curative radiotherapy to the right lower lobe pulmonary nodule under the care of Dr. Tammi Klippel completed on December 21, 2019.  CURRENT THERAPY: Observation.   INTERVAL HISTORY: RICH PAPROCKI 86 y.o. male returns to the clinic today for a follow-up visit accompanied by his wife.  The patient was last seen in the clinic 6 months ago.  The patient has a history of recurrent squamous cell carcinoma.  He has been on observation since 2021 and feeling fairly well except for peripheral neuropathy. He is on Cymbalta and gabapentin. He also has a history of alcohol abuse. He no longer drinks alcohol. He was previously seen by Dr. Mickeal Skinner for neuropathy. He has tried lyrica as well without any improvement. Otherwise, he denies any new concerning complaints he denies any fever, chills, night sweats, or unexplained weight loss. Denies cough or shortness of breath.   Denies any chest pain or hemoptysis.  Denies any nausea, vomiting, diarrhea, or constipation.  Denies any headache or visual changes.  The patient  recently had a restaging CT scan performed.  He is here today for evaluation and to review his scan results and for more detailed discussion about his current condition.     MEDICAL HISTORY: Past Medical History:  Diagnosis Date   Chronic back pain    Hypercholesterolemia    Hypertension    lung ca dx'd 09/2017   possible lung cancer    ALLERGIES:  has No Known Allergies.  MEDICATIONS:  Current Outpatient Medications  Medication Sig Dispense Refill   acetaminophen (TYLENOL) 500 MG tablet Take 1,000 mg by mouth every 6 (six) hours as needed for moderate pain or headache.     amLODipine (NORVASC) 10 MG tablet Take 10 mg by mouth daily.     aspirin EC 81 MG tablet Take 81 mg by mouth daily.     chlorproMAZINE (THORAZINE) 25 MG tablet Take 1 tablet (25 mg total) by mouth 3 (three) times daily as needed. 30 tablet 0   DULoxetine (CYMBALTA) 60 MG capsule TAKE 1 CAPSULE(60 MG) BY MOUTH DAILY 30 capsule 3   famotidine (PEPCID) 20 MG tablet Take 20 mg by mouth daily as needed for indigestion.     gabapentin (NEURONTIN) 300 MG capsule TAKE 3 CAPSULES(900 MG) BY MOUTH THREE TIMES DAILY 120 capsule 6   gabapentin (NEURONTIN) 300 MG capsule TAKE 3 CAPSULES(900 MG) BY MOUTH THREE TIMES DAILY 120 capsule 6   gabapentin (NEURONTIN) 300 MG capsule TAKE 3 CAPSULES(900 MG) BY MOUTH THREE TIMES DAILY 120 capsule 6   losartan (COZAAR) 25 MG tablet Take 25 mg by mouth daily.      metoprolol succinate (TOPROL-XL) 25 MG 24 hr  tablet Take 1 tablet (25 mg total) by mouth daily. 60 tablet 1   naloxone (NARCAN) nasal spray 4 mg/0.1 mL      Oxycodone HCl 20 MG TABS Take 20 mg by mouth every 6 (six) hours as needed (pain).   0   simvastatin (ZOCOR) 20 MG tablet Take 20 mg by mouth daily.     Vitamin D, Ergocalciferol, (DRISDOL) 1.25 MG (50000 UNIT) CAPS capsule Take 50,000 Units by mouth once a week.     No current facility-administered medications for this visit.    SURGICAL HISTORY:  Past Surgical History:   Procedure Laterality Date   BRONCHIAL NEEDLE ASPIRATION BIOPSY  08/05/2017   Procedure: BRONCHIAL NEEDLE ASPIRATION BIOPSIES;  Surgeon: Juanito Doom, MD;  Location: [facility];  Service: Cardiopulmonary;;   ENDOBRONCHIAL ULTRASOUND Bilateral 08/05/2017   Procedure: ENDOBRONCHIAL ULTRASOUND;  Surgeon: Juanito Doom, MD;  Location: [facility];  Service: Cardiopulmonary;  Laterality: Bilateral;   IR IMAGING GUIDED PORT INSERTION  12/27/2017   IR REMOVAL TUN ACCESS W/ PORT W/O FL MOD SED  03/25/2020   MEDIASTINOSCOPY N/A 09/19/2017   Procedure: MEDIASTINOSCOPY;  Surgeon: [name], MD;  Location: [facility];  Service: Thoracic;  Laterality: N/A;   MULTIPLE TOOTH EXTRACTIONS     [place] (VATS)/ LOBECTOMY Right 09/19/2017   Procedure: RIGHT VIDEO ASSISTED THORACOSCOPY (VATS)/ LOBECTOMY;  Surgeon: [name], MD;  Location: [facility];  Service: Thoracic;  Laterality: Right;    REVIEW OF SYSTEMS:   Review of Systems  Constitutional: Negative for appetite change, chills, fatigue, fever and unexpected weight change.  HENT:   Negative for mouth sores, nosebleeds, sore throat and trouble swallowing.   Eyes: Negative for eye problems and icterus.  Respiratory: Negative for cough, hemoptysis, shortness of breath and wheezing.   Cardiovascular: Negative for chest pain and leg swelling.  Gastrointestinal: Negative for abdominal pain, constipation, diarrhea, nausea and vomiting.  Genitourinary: Negative for bladder incontinence, difficulty urinating, dysuria, frequency and hematuria.   Musculoskeletal: Negative for back pain, gait problem, neck pain and neck stiffness.  Skin: Negative for itching and rash.  Neurological: Positive for peripheral neuropathy. Negative for dizziness, extremity weakness, gait problem, headaches, light-headedness and seizures.  Hematological: Negative for adenopathy. Does not bruise/bleed easily.  Psychiatric/Behavioral: Negative for  confusion, depression and sleep disturbance. The patient is not nervous/anxious.     PHYSICAL EXAMINATION:  Blood pressure 126/84, pulse 89, temperature 97.6 F (36.4 C), temperature source Tympanic, resp. rate 17, height 5\' 8"  (1.727 m), weight 168 lb 9.6 oz (76.5 kg), SpO2 93 %.  ECOG PERFORMANCE STATUS: 1  Physical Exam  Constitutional: Oriented to person, place, and time and well-developed, well-nourished, and in no distress.  HENT:  Head: Normocephalic and atraumatic.  Mouth/Throat: Oropharynx is clear and moist. No oropharyngeal exudate.  Eyes: Conjunctivae are normal. Right eye exhibits no discharge. Left eye exhibits no discharge. No scleral icterus.  Neck: Normal range of motion. Neck supple.  Cardiovascular: Normal rate, regular rhythm, normal heart sounds and intact distal pulses.   Pulmonary/Chest: Effort normal and breath sounds normal. No respiratory distress. No wheezes. No rales.  Abdominal: Soft. Bowel sounds are normal. Exhibits no distension and no mass. There is no tenderness.  Musculoskeletal: Normal range of motion. Exhibits no edema.  Lymphadenopathy:    No cervical adenopathy.  Neurological: Alert and oriented to person, place, and time. Exhibits normal muscle tone. Gait normal. Coordination normal.  Skin: Skin is warm and dry. No rash noted. Not  diaphoretic. No erythema. No pallor.  Psychiatric: Mood, memory and judgment normal.  Vitals reviewed.  LABORATORY DATA: Lab Results  Component Value Date   WBC 5.3 08/24/2021   HGB 13.2 08/24/2021   HCT 39.0 08/24/2021   MCV 94.9 08/24/2021   PLT 125 (L) 08/24/2021      Chemistry      Component Value Date/Time   NA 137 08/24/2021 1451   K 4.1 08/24/2021 1451   CL 106 08/24/2021 1451   CO2 25 08/24/2021 1451   BUN 17 08/24/2021 1451   CREATININE 1.51 (H) 08/24/2021 1451      Component Value Date/Time   CALCIUM 9.6 08/24/2021 1451   ALKPHOS 70 08/24/2021 1451   AST 23 08/24/2021 1451   ALT 16  08/24/2021 1451   BILITOT 0.3 08/24/2021 1451       RADIOGRAPHIC STUDIES:  CT Chest Wo Contrast  Result Date: 08/25/2021 CLINICAL DATA:  Primary Cancer Type: Lung Imaging Indication: Routine surveillance Interval therapy since last imaging? No Initial Cancer Diagnosis Date: 07/29/2017; Established by: Biopsy-proven Detailed Pathology: Stage IIB non-small cell lung cancer, invasive well-differentiated squamous cell carcinoma. Primary Tumor location:  Right middle lobe. Recurrence? Yes; Date(s) of recurrence: 10/22/2019; Established by: Biopsy-proven Surgeries: Right middle lobectomy 09/19/2017. Chemotherapy: Yes; Ongoing? No; Most recent administration: 01/05/2018 Immunotherapy? No Radiation therapy? Yes; Date Range: 12/10/2019 - 12/21/2019; Target: Right lower lobe. * Tracking Code: BO * EXAM: CT CHEST WITHOUT CONTRAST TECHNIQUE: Multidetector CT imaging of the chest was performed following the standard protocol without IV contrast. RADIATION DOSE REDUCTION: This exam was performed according to the departmental dose-optimization program which includes automated exposure control, adjustment of the mA and/or kV according to patient size and/or use of iterative reconstruction technique. COMPARISON:  Most recent CT chest 02/06/2021.  09/27/2019 PET-CT. FINDINGS: Cardiovascular: Coronary artery calcification and aortic atherosclerotic calcification. Mediastinum/Nodes: Multiple calcified mediastinal lymph nodes. There is fluid in the midesophagus. Esophagus is mildly dilated throughout its course. Findings unchanged from prior. Lungs/Pleura: Volume loss in the RIGHT hemithorax. Pleuroparenchymal apical thickening at the RIGHT lung apex unchanged. Centrilobular emphysema the RIGHT lung apex unchanged. There is surgical staple line in the mid RIGHT lung without increased nodularity. Previously measured nodule in the posterior RIGHT lower lobe measures 15 mm by 11 mm not changed from comparison exam. There is  increased spiculated linear thickening surrounding this nodule (image 63/5). This nodule was hypermetabolic on comparison PET-CT scan 09/27/2018. The LEFT lung is hyperexpanded. Stable pleuroparenchymal thickening at the LEFT lung apex. Extensive centrilobular emphysema again noted. No suspicious nodularity Upper Abdomen: Limited view of the liver, kidneys, pancreas are unremarkable. Normal adrenal glands. Musculoskeletal: No aggressive osseous lesion IMPRESSION: 1. Stable nodular density in the RIGHT lower lobe with increased peripheral spiculated thickening. Findings may represent progression of radiation change. Recommend close attention on follow-up. 2. Stable postoperative changes in the more superior RIGHT lung without new nodularity. 3. Extensive centrilobular emphysema in both lungs. 4. Multiple calcified mediastinal lymph nodes. 5. Fluid and gas within the esophagus unchanged from comparison exam Electronically Signed   By: Suzy Bouchard M.D.   On: 08/25/2021 12:03     ASSESSMENT/PLAN:  This is a very pleasant 86 year old African-American male with recurrent non-small cell lung cancer which was initially diagnosed as a stage IIb (T3, N0, M0) invasive well-differentiated squamous cell carcinoma.  He initially presented with right middle lobe lung mass in July 2019.  He is status post a right middle lobe lobectomy with lymph node sampling on 09/19/2017  under the care of Dr. Roxan Hockey.  He then underwent adjuvant systemic chemotherapy with carboplatin for an AUC of 6 and paclitaxel 200 mg per metered squared IV every 3 weeks with Neulasta support.  He is status post 3 cycles but this was discontinued due to chemotherapy-induced peripheral neuropathy.  He then had evidence of disease progression in 2021 when a CT scan showed further increase in the previously identified subpleural nodule in the right lower lobe.  This was suspicious for metastatic or metachronous malignancy.  Patient had a PET  scan that showed hypermetabolic activity in a new pulmonary nodule in the posterior right chest along the pleura suspicious for disease recurrence.  There is also new thoracic inlet lymph nodes raising the question of metastatic involvement as well as numerous hypermetabolic lymph nodes in the mediastinum and hilar area.  It was felt that the mediastinal lymphadenopathy was similar to what the patient had several years ago and likely secondary to sarcoidosis.  He underwent a CT-guided biopsy of the right lower lobe lung nodule the final pathology was consistent squamous cell carcinoma.  Therefore, the patient underwent right lower lobe SBRT under the care of Dr. Tammi Klippel.  The patient recently had a restaging CT scan performed.  Dr. Julien Nordmann personally independently reviewed the scan and discussed the results with the patient today.  The scan did not show any evidence of disease progression.  Recommend the patient continue on observation with a restaging CT scan in 6 months.  We will see him back for follow-up at that time for evaluation to review his scan results.  He will continue gabapentin and Cymbalta for his peripheral neuropathy. He has also tried lyrica without any significant improvement. He received 3 rounds of taxol, however, he has a history of alcohol use which likely contributed to his PN. I recommended that if he needs additional recommendations for his neuropathy, I would recommend he reach out to his PCP for additional recommendations. Dr. Julien Nordmann recommended he try PEA. I will call the patient with this information.   The patient was advised to call immediately if he has any concerning symptoms in the interval. The patient voices understanding of current disease status and treatment options and is in agreement with the current care plan. All questions were answered. The patient knows to call the clinic with any problems, questions or concerns. We can certainly see the patient much  sooner if necessary   Orders Placed This Encounter  Procedures   CT Chest Wo Contrast    Standing Status:   Future    Standing Expiration Date:   08/26/2022    Order Specific Question:   Preferred imaging location?    Answer:   [facility]   CBC with Differential ([facility] Only)    Standing Status:   Future    Standing Expiration Date:   08/27/2022   CMP ([facility] only)    Standing Status:   Future    Standing Expiration Date:   08/27/2022     Tobe Sos Chasten Blaze, PA-C 08/26/21  ADDENDUM: Hematology/Oncology Attending: I had a face-to-face encounter with the patient today.  I reviewed his record, lab, scan and recommended his care plan.  This is a very pleasant 86 years old African-American male with history of stage IIb (T3, N0, M0) invasive well-differentiated squamous cell carcinoma diagnosed in July 2019.  The patient is status post right middle lobectomy with lymph node sampling followed by 3 cycles of adjuvant systemic chemotherapy with carboplatin and  paclitaxel.  This was discontinued secondary to significant peripheral neuropathy and intolerance. The patient also underwent SBRT to right lower lobe lung nodule that was pathology confirmed to be squamous cell carcinoma under the care of Dr. Tammi Klippel. Has been on observation since that time and feeling fine except for the peripheral neuropathy.  He is currently on gabapentin and Cymbalta but no complete relief of his neuropathy. We recommended for him to consider over-the-counter PEA. He had repeat CT scan of the chest performed recently.  I will personally and independently reviewed the scan and discussed the result with the patient and his wife. His scan showed no concerning findings for disease recurrence or progression.  I recommended for the patient to continue on observation with repeat CT scan of the chest in 6 months. He was advised to call immediately if he has any other concerning symptoms in the  interval.  Disclaimer: This note was dictated with voice recognition software. Similar sounding words can inadvertently be transcribed and may be missed upon review. Eilleen Kempf, MD 08/26/21

## 2021-08-24 ENCOUNTER — Other Ambulatory Visit: Payer: Medicare Other

## 2021-08-24 ENCOUNTER — Inpatient Hospital Stay: Payer: Medicare Other

## 2021-08-24 ENCOUNTER — Inpatient Hospital Stay: Payer: Medicare Other | Attending: Physician Assistant

## 2021-08-24 ENCOUNTER — Other Ambulatory Visit: Payer: Self-pay | Admitting: Lab

## 2021-08-24 ENCOUNTER — Other Ambulatory Visit: Payer: Self-pay

## 2021-08-24 ENCOUNTER — Ambulatory Visit (HOSPITAL_COMMUNITY)
Admission: RE | Admit: 2021-08-24 | Discharge: 2021-08-24 | Disposition: A | Discharge status: Home / Self Care | Payer: Medicare Other | Source: Ambulatory Visit | Attending: Internal Medicine | Admitting: Internal Medicine

## 2021-08-24 DIAGNOSIS — Z902 Acquired absence of lung [part of]: Secondary | ICD-10-CM | POA: Insufficient documentation

## 2021-08-24 DIAGNOSIS — Z923 Personal history of irradiation: Secondary | ICD-10-CM | POA: Insufficient documentation

## 2021-08-24 DIAGNOSIS — Z79899 Other long term (current) drug therapy: Secondary | ICD-10-CM | POA: Insufficient documentation

## 2021-08-24 DIAGNOSIS — Z9221 Personal history of antineoplastic chemotherapy: Secondary | ICD-10-CM | POA: Insufficient documentation

## 2021-08-24 DIAGNOSIS — C3490 Malignant neoplasm of unspecified part of unspecified bronchus or lung: Secondary | ICD-10-CM

## 2021-08-24 DIAGNOSIS — G629 Polyneuropathy, unspecified: Secondary | ICD-10-CM | POA: Insufficient documentation

## 2021-08-24 DIAGNOSIS — Z85118 Personal history of other malignant neoplasm of bronchus and lung: Secondary | ICD-10-CM | POA: Insufficient documentation

## 2021-08-24 DIAGNOSIS — Z7982 Long term (current) use of aspirin: Secondary | ICD-10-CM | POA: Insufficient documentation

## 2021-08-24 LAB — CMP (CANCER CENTER ONLY)
ALT: 16 U/L (ref 0–44)
AST: 23 U/L (ref 15–41)
Albumin: 3.8 g/dL (ref 3.5–5.0)
Alkaline Phosphatase: 70 U/L (ref 38–126)
Anion gap: 6 (ref 5–15)
BUN: 17 mg/dL (ref 8–23)
CO2: 25 mmol/L (ref 22–32)
Calcium: 9.6 mg/dL (ref 8.9–10.3)
Chloride: 106 mmol/L (ref 98–111)
Creatinine: 1.51 mg/dL — ABNORMAL HIGH (ref 0.61–1.24)
GFR, Estimated: 46 mL/min — ABNORMAL LOW (ref >60)
Glucose, Bld: 106 mg/dL — ABNORMAL HIGH (ref 70–99)
Potassium: 4.1 mmol/L (ref 3.5–5.1)
Sodium: 137 mmol/L (ref 135–145)
Total Bilirubin: 0.3 mg/dL (ref 0.3–1.2)
Total Protein: 7.6 g/dL (ref 6.5–8.1)

## 2021-08-24 LAB — CBC WITH DIFFERENTIAL (CANCER CENTER ONLY)
Abs Immature Granulocytes: 0.01 10*3/uL (ref 0.00–0.07)
Basophils Absolute: 0 10*3/uL (ref 0.0–0.1)
Basophils Relative: 0 %
Eosinophils Absolute: 0.2 10*3/uL (ref 0.0–0.5)
Eosinophils Relative: 5 %
HCT: 39 % (ref 39.0–52.0)
Hemoglobin: 13.2 g/dL (ref 13.0–17.0)
Immature Granulocytes: 0 %
Lymphocytes Relative: 33 %
Lymphs Abs: 1.8 10*3/uL (ref 0.7–4.0)
MCH: 32.1 pg (ref 26.0–34.0)
MCHC: 33.8 g/dL (ref 30.0–36.0)
MCV: 94.9 fL (ref 80.0–100.0)
Monocytes Absolute: 0.6 10*3/uL (ref 0.1–1.0)
Monocytes Relative: 11 %
Neutro Abs: 2.7 10*3/uL (ref 1.7–7.7)
Neutrophils Relative %: 51 %
Platelet Count: 125 10*3/uL — ABNORMAL LOW (ref 150–400)
RBC: 4.11 MIL/uL — ABNORMAL LOW (ref 4.22–5.81)
RDW: 13 % (ref 11.5–15.5)
WBC Count: 5.3 10*3/uL (ref 4.0–10.5)
nRBC: 0 % (ref 0.0–0.2)

## 2021-08-25 ENCOUNTER — Other Ambulatory Visit: Payer: Medicare Other

## 2021-08-26 ENCOUNTER — Inpatient Hospital Stay (HOSPITAL_BASED_OUTPATIENT_CLINIC_OR_DEPARTMENT_OTHER): Payer: Medicare Other | Admitting: Physician Assistant

## 2021-08-26 ENCOUNTER — Other Ambulatory Visit: Payer: Self-pay

## 2021-08-26 VITALS — BP 126/84 | HR 89 | Temp 97.6°F | Resp 17 | Ht 68.0 in | Wt 168.6 lb

## 2021-08-26 DIAGNOSIS — Z902 Acquired absence of lung [part of]: Secondary | ICD-10-CM | POA: Diagnosis not present

## 2021-08-26 DIAGNOSIS — G629 Polyneuropathy, unspecified: Secondary | ICD-10-CM | POA: Diagnosis not present

## 2021-08-26 DIAGNOSIS — Z85118 Personal history of other malignant neoplasm of bronchus and lung: Secondary | ICD-10-CM | POA: Diagnosis present

## 2021-08-26 DIAGNOSIS — Z79899 Other long term (current) drug therapy: Secondary | ICD-10-CM | POA: Diagnosis not present

## 2021-08-26 DIAGNOSIS — C3491 Malignant neoplasm of unspecified part of right bronchus or lung: Secondary | ICD-10-CM

## 2021-08-26 DIAGNOSIS — Z9221 Personal history of antineoplastic chemotherapy: Secondary | ICD-10-CM | POA: Diagnosis not present

## 2021-08-26 DIAGNOSIS — Z7982 Long term (current) use of aspirin: Secondary | ICD-10-CM | POA: Diagnosis not present

## 2021-08-26 DIAGNOSIS — Z923 Personal history of irradiation: Secondary | ICD-10-CM | POA: Diagnosis not present

## 2021-08-26 NOTE — Patient Instructions (Signed)
Palmitoylethanolamide (PEA)-Supplement for neuropathic pain (nerve pain)

## 2021-08-27 ENCOUNTER — Ambulatory Visit: Payer: Medicare Other | Admitting: Internal Medicine

## 2021-09-11 ENCOUNTER — Other Ambulatory Visit: Payer: Self-pay | Admitting: Internal Medicine

## 2021-09-28 ENCOUNTER — Other Ambulatory Visit: Payer: Self-pay

## 2021-09-28 ENCOUNTER — Emergency Department (HOSPITAL_BASED_OUTPATIENT_CLINIC_OR_DEPARTMENT_OTHER)
Admission: EM | Admit: 2021-09-28 | Discharge: 2021-09-28 | Disposition: A | Discharge status: Home / Self Care | Payer: Medicare Other | Attending: Emergency Medicine | Admitting: Emergency Medicine

## 2021-09-28 ENCOUNTER — Encounter (HOSPITAL_BASED_OUTPATIENT_CLINIC_OR_DEPARTMENT_OTHER): Payer: Self-pay

## 2021-09-28 DIAGNOSIS — Z79899 Other long term (current) drug therapy: Secondary | ICD-10-CM | POA: Insufficient documentation

## 2021-09-28 DIAGNOSIS — S70361A Insect bite (nonvenomous), right thigh, initial encounter: Secondary | ICD-10-CM | POA: Diagnosis present

## 2021-09-28 DIAGNOSIS — W57XXXA Bitten or stung by nonvenomous insect and other nonvenomous arthropods, initial encounter: Secondary | ICD-10-CM | POA: Diagnosis not present

## 2021-09-28 DIAGNOSIS — Z7982 Long term (current) use of aspirin: Secondary | ICD-10-CM | POA: Insufficient documentation

## 2021-09-28 MED ORDER — BACITRACIN ZINC 500 UNIT/GM EX OINT
TOPICAL_OINTMENT | Freq: Two times a day (BID) | CUTANEOUS | Status: DC
  Filled 2021-09-28: qty 28.35

## 2021-09-28 MED ORDER — CEPHALEXIN 500 MG PO CAPS: 500.0000 mg | ORAL_CAPSULE | Freq: Three times a day (TID) | ORAL | 0 refills | Status: AC

## 2021-09-28 NOTE — Discharge Instructions (Signed)
Can apply bacitracin to area twice daily. If redness spreads beyond outlined area, start Keflex and complete the full course. Recheck with your provider if not improving.

## 2021-09-28 NOTE — ED Notes (Signed)
Bit by a insect in the last day or so while asleep. No known allergies to any insects. Originally painful, some tingling/numbness at the bit location. Redness has increased since the the bite. Appears to be one puncture wound. No other obvious bite.

## 2021-09-28 NOTE — ED Triage Notes (Signed)
Patient here POV from Home.  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 Located to Right Upper Medial Thigh (Not Visualized in Triage). Red and Sore to Area per Patient.   NAD Noted during Triage. A&Ox4. GCS 15. Ambulatory.

## 2021-09-28 NOTE — ED Notes (Signed)
D/c paperwork reviewed with pt, including prescription. Pt verbalized understanding, no questions or concerns at time of d/c. Pt ambulatory to ED exit w/o assistance, accompanied by family.

## 2021-09-28 NOTE — ED Provider Notes (Signed)
[facility] EMERGENCY DEPT Provider Note   CSN: [id number] Arrival date & time: 09/28/21  1718     History  Chief Complaint  Patient presents with   Insect Bite    Danny Duke is a 86 y.o. male.  86 year old male presents with concern for insect bite to his right inner thigh.  Patient states he first noticed the area yesterday morning while getting ready for church.  States area is very sensitive to the touch, irritating and bothersome.  Is concern for surrounding redness in the area.  Denies history of diabetes or other immune compromised state.  States that the day before this he was under the crawlspace working under the house although was wearing jeans and did not feel or see anything bite him.  Notes an isolated lesion, no additional lesions found.  No other complaints or concerns.       Home Medications Prior to Admission medications   Medication Sig Start Date End Date Taking? Authorizing Provider  cephALEXin (KEFLEX) 500 MG capsule Take 1 capsule (500 mg total) by mouth 3 (three) times daily for 7 days. 09/28/21 10/05/21 Yes Tacy Learn, PA-C  acetaminophen (TYLENOL) 500 MG tablet Take 1,000 mg by mouth every 6 (six) hours as needed for moderate pain or headache.    [provider]  amLODipine (NORVASC) 10 MG tablet Take 10 mg by mouth daily.    [provider]  aspirin EC 81 MG tablet Take 81 mg by mouth daily.    [provider]  chlorproMAZINE (THORAZINE) 25 MG tablet Take 1 tablet (25 mg total) by mouth 3 (three) times daily as needed. 12/01/17   Maryanna Shape, NP  DULoxetine (CYMBALTA) 60 MG capsule TAKE 1 CAPSULE(60 MG) BY MOUTH DAILY 09/11/21   Ventura Sellers, MD  famotidine (PEPCID) 20 MG tablet Take 20 mg by mouth daily as needed for indigestion.    [provider]  gabapentin (NEURONTIN) 300 MG capsule TAKE 3 CAPSULES(900 MG) BY MOUTH THREE TIMES DAILY 10/06/20   Vaslow, Acey Lav, MD  gabapentin (NEURONTIN)  300 MG capsule TAKE 3 CAPSULES(900 MG) BY MOUTH THREE TIMES DAILY 10/06/20   Vaslow, Acey Lav, MD  gabapentin (NEURONTIN) 300 MG capsule TAKE 3 CAPSULES(900 MG) BY MOUTH THREE TIMES DAILY 03/10/21   Ventura Sellers, MD  losartan (COZAAR) 25 MG tablet Take 25 mg by mouth daily.  11/26/18   [provider]  metoprolol succinate (TOPROL-XL) 25 MG 24 hr tablet Take 1 tablet (25 mg total) by mouth daily. 09/26/17   John Giovanni, PA-C  naloxone [facility]) nasal spray 4 mg/0.1 mL  04/25/20   [provider]  Oxycodone HCl 20 MG TABS Take 20 mg by mouth every 6 (six) hours as needed (pain).  10/31/17   [provider]  simvastatin (ZOCOR) 20 MG tablet Take 20 mg by mouth daily.    [provider]  Vitamin D, Ergocalciferol, (DRISDOL) 1.25 MG (50000 UNIT) CAPS capsule Take 50,000 Units by mouth once a week. 10/14/19   [provider]      Allergies    Patient has no known allergies.    Review of Systems   Review of Systems Negative except as per HPI Physical Exam Updated Vital Signs BP 113/87   Pulse 77   Temp 98.1 F (36.7 C)   Resp 17   Ht 5\' 8"  (1.727 m)   Wt 76.5 kg   SpO2 99%   BMI 25.64 kg/m  Physical Exam Vitals and nursing note reviewed.  Constitutional:      General: He is not in acute distress.    Appearance: He is well-developed. He is not diaphoretic.  HENT:     Head: Normocephalic and atraumatic.  Pulmonary:     Effort: Pulmonary effort is normal.  Musculoskeletal:     Comments: Approximately 2 cm x 2 cm area of mildly erythematous area to right inner thigh without streaking.  There is a central area which appears to have maybe been a vesicle (small).  No additional lesions noted.  No induration.  Skin:    General: Skin is warm and dry.     Findings: Erythema present.  Neurological:     Mental Status: He is alert and oriented to person, place, and time.     Motor: No weakness.     Gait: Gait normal.  Psychiatric:        Behavior:  Behavior normal.     ED Results / Procedures / Treatments   Labs (all labs ordered are listed, but only abnormal results are displayed) Labs Reviewed - No data to display  EKG None  Radiology No results found.  Procedures Procedures    Medications Ordered in ED Medications  bacitracin ointment (has no administration in time range)    ED Course/ Medical Decision Making/ A&P                           Medical Decision Making  86 year old male with concern for a bug bite to his right inner thigh which now has mild amount of redness around the bite.  Area has been outlined.  Recommend monitoring the area, can Apply topical bacitracin.  If redness spreads beyond the red area, patient has a prescription for Keflex which she can take with plan to recheck with his PCP if not improving.        Final Clinical Impression(s) / ED Diagnoses Final diagnoses:  Insect bite of right thigh, initial encounter    Rx / DC Orders ED Discharge Orders          Ordered    cephALEXin (KEFLEX) 500 MG capsule  3 times daily        09/28/21 1911              Roque Lias 09/28/21 2038    Malvin Johns, MD 09/28/21 2311

## 2021-10-26 ENCOUNTER — Encounter: Payer: Medicare Other | Admitting: Surgery

## 2021-10-26 ENCOUNTER — Other Ambulatory Visit (HOSPITAL_COMMUNITY): Payer: Medicare Other

## 2021-11-27 ENCOUNTER — Other Ambulatory Visit: Payer: Self-pay | Admitting: *Deleted

## 2021-11-27 DIAGNOSIS — R109 Unspecified abdominal pain: Secondary | ICD-10-CM

## 2021-12-07 ENCOUNTER — Ambulatory Visit (HOSPITAL_COMMUNITY)
Admission: RE | Admit: 2021-12-07 | Discharge: 2021-12-07 | Disposition: A | Discharge status: Home / Self Care | Payer: Medicare Other | Source: Ambulatory Visit | Attending: Surgery | Admitting: Surgery

## 2021-12-07 ENCOUNTER — Ambulatory Visit: Payer: Medicare Other | Admitting: Surgery

## 2021-12-07 ENCOUNTER — Encounter: Payer: Self-pay | Admitting: Surgery

## 2021-12-07 VITALS — BP 134/87 | HR 64 | Temp 97.8°F | Resp 20 | Ht 68.0 in | Wt 165.0 lb

## 2021-12-07 DIAGNOSIS — I7143 Infrarenal abdominal aortic aneurysm, without rupture: Secondary | ICD-10-CM | POA: Diagnosis not present

## 2021-12-07 DIAGNOSIS — R109 Unspecified abdominal pain: Secondary | ICD-10-CM | POA: Insufficient documentation

## 2021-12-07 NOTE — Progress Notes (Signed)
Vascular and [facility]in Specialist of [place]  Patient name: Danny Duke MRN: [id number] DOB: 12-06-1935 Sex: male   REQUESTING PROVIDER:    Basilio Cairo   REASON FOR CONSULT:    AAA  HISTORY OF PRESENT ILLNESS:   Danny Duke is a 86 y.o. male, who is here today for evaluation of an abdominal aortic aneurysm.  He denies any abdominal pain.  He does have chronic back pain.  He is medically managed for hypertension.  He is a former smoker.  He takes a statin for hypercholesterolemia.  PAST MEDICAL HISTORY    Past Medical History:  Diagnosis Date   AAA (abdominal aortic aneurysm) (HCC)    Chronic back pain    Hypercholesterolemia    Hypertension    lung ca dx'd 09/2017   possible lung cancer     FAMILY HISTORY   Family History  Problem Relation Age of Onset   Colon cancer Brother    Stomach cancer Brother     SOCIAL HISTORY:   Social History   Socioeconomic History   Marital status: Married    Spouse name: Not on file   Number of children: Not on file   Years of education: Not on file   Highest education level: Not on file  Occupational History   Not on file  Tobacco Use   Smoking status: Former    Packs/day: 0.50    Years: 40.00    Total pack years: 20.00    Types: Cigarettes    Quit date: 03/08/1997    Years since quitting: 24.7   Smokeless tobacco: Never  Vaping Use   Vaping Use: Never used  Substance and Sexual Activity   Alcohol use: No   Drug use: No   Sexual activity: Not on file  Other Topics Concern   Not on file  Social History Narrative   Not on file   Social Determinants of Health   Financial Resource Strain: Not on file  Food Insecurity: Not on file  Transportation Needs: Not on file  Physical Activity: Not on file  Stress: Not on file  Social Connections: Not on file  Intimate Partner Violence: Not on file    ALLERGIES:    No Known Allergies  CURRENT MEDICATIONS:    Current  Outpatient Medications  Medication Sig Dispense Refill   acetaminophen (TYLENOL) 500 MG tablet Take 1,000 mg by mouth every 6 (six) hours as needed for moderate pain or headache.     amLODipine (NORVASC) 10 MG tablet Take 10 mg by mouth daily.     aspirin EC 81 MG tablet Take 81 mg by mouth daily.     DULoxetine (CYMBALTA) 60 MG capsule TAKE 1 CAPSULE(60 MG) BY MOUTH DAILY 30 capsule 3   gabapentin (NEURONTIN) 300 MG capsule TAKE 3 CAPSULES(900 MG) BY MOUTH THREE TIMES DAILY 120 capsule 6   gabapentin (NEURONTIN) 300 MG capsule TAKE 3 CAPSULES(900 MG) BY MOUTH THREE TIMES DAILY 120 capsule 6   losartan (COZAAR) 25 MG tablet Take 25 mg by mouth daily.      metoprolol succinate (TOPROL-XL) 25 MG 24 hr tablet Take 1 tablet (25 mg total) by mouth daily. 60 tablet 1   Oxycodone HCl 20 MG TABS Take 20 mg by mouth every 6 (six) hours as needed (pain).   0   simvastatin (ZOCOR) 20 MG tablet Take 20 mg by mouth daily.     No current facility-administered medications for this visit.    REVIEW OF SYSTEMS:   [  X] denotes positive finding, [ ]  denotes negative finding Cardiac  Comments:  Chest pain or chest pressure:    Shortness of breath upon exertion:    Short of breath when lying flat:    Irregular heart rhythm:        Vascular    Pain in calf, thigh, or hip brought on by ambulation:    Pain in feet at night that wakes you up from your sleep:     Blood clot in your veins:    Leg swelling:         Pulmonary    Oxygen at home:    Productive cough:     Wheezing:         Neurologic    Sudden weakness in arms or legs:     Sudden numbness in arms or legs:     Sudden onset of difficulty speaking or slurred speech:    Temporary loss of vision in one eye:     Problems with dizziness:         Gastrointestinal    Blood in stool:      Vomited blood:         Genitourinary    Burning when urinating:     Blood in urine:        Psychiatric    Major depression:         Hematologic     Bleeding problems:    Problems with blood clotting too easily:        Skin    Rashes or ulcers:        Constitutional    Fever or chills:     PHYSICAL EXAM:   Vitals:   12/07/21 0917  BP: 134/87  Pulse: 64  Resp: 20  Temp: 97.8 F (36.6 C)  SpO2: 96%  Weight: 165 lb (74.8 kg)  Height: 5\' 8"  (1.727 m)    GENERAL: The patient is a well-nourished male, in no acute distress. The vital signs are documented above. CARDIAC: There is a regular rate and rhythm.  VASCULAR: Palpable pedal pulses bilaterally PULMONARY: Nonlabored respirations ABDOMEN: Soft and non-tender with nontender pulsatile mass MUSCULOSKELETAL: There are no major deformities or cyanosis. NEUROLOGIC: No focal weakness or paresthesias are detected. SKIN: There are no ulcers or rashes noted. PSYCHIATRIC: The patient has a normal affect.  STUDIES:   I have reviewed his ultrasound with the following findings: Abdominal Aorta: There is evidence of abnormal dilatation of the mid  Abdominal aorta (4.90 x 4.90 cm). There is evidence of abnormal dilation  of the Left Common Iliac artery (2.8 x 2.8 cm).. No previous exam  available for comparison.      ASSESSMENT and PLAN   AAA: By ultrasound, maximum aortic diameter is 4.9 cm with a 2.8 cm left common iliac aneurysm.  I discussed with the patient that he will need to get a CT angiogram to define his anatomy and to confirm the size measurements.  I will work on getting that within the next few weeks and have him return for follow-up.  When he returns I will evaluate bilateral lower extremities for popliteal aneurysms, and check a carotid duplex.   Leia Alf, MD, FACS [facility] of [facility] [phone] Pager [phone]

## 2021-12-08 ENCOUNTER — Other Ambulatory Visit: Payer: Self-pay

## 2021-12-08 DIAGNOSIS — I739 Peripheral vascular disease, unspecified: Secondary | ICD-10-CM

## 2021-12-08 DIAGNOSIS — I6529 Occlusion and stenosis of unspecified carotid artery: Secondary | ICD-10-CM

## 2021-12-15 ENCOUNTER — Other Ambulatory Visit: Payer: Self-pay

## 2021-12-15 DIAGNOSIS — I7143 Infrarenal abdominal aortic aneurysm, without rupture: Secondary | ICD-10-CM

## 2021-12-15 DIAGNOSIS — R109 Unspecified abdominal pain: Secondary | ICD-10-CM

## 2021-12-24 ENCOUNTER — Ambulatory Visit (INDEPENDENT_AMBULATORY_CARE_PROVIDER_SITE_OTHER)
Admission: RE | Admit: 2021-12-24 | Discharge: 2021-12-24 | Disposition: A | Discharge status: Home / Self Care | Payer: Medicare Other | Source: Ambulatory Visit | Attending: Surgery | Admitting: Surgery

## 2021-12-24 ENCOUNTER — Ambulatory Visit (HOSPITAL_COMMUNITY)
Admission: RE | Admit: 2021-12-24 | Discharge: 2021-12-24 | Disposition: A | Discharge status: Home / Self Care | Payer: Medicare Other | Source: Ambulatory Visit | Attending: Vascular Surgery | Admitting: Vascular Surgery

## 2021-12-24 ENCOUNTER — Ambulatory Visit (HOSPITAL_COMMUNITY)
Admission: RE | Admit: 2021-12-24 | Discharge: 2021-12-24 | Disposition: A | Discharge status: Home / Self Care | Payer: Medicare Other | Source: Ambulatory Visit | Attending: Surgery | Admitting: Surgery

## 2021-12-24 DIAGNOSIS — I6529 Occlusion and stenosis of unspecified carotid artery: Secondary | ICD-10-CM

## 2021-12-24 DIAGNOSIS — I7143 Infrarenal abdominal aortic aneurysm, without rupture: Secondary | ICD-10-CM | POA: Diagnosis present

## 2021-12-24 DIAGNOSIS — R109 Unspecified abdominal pain: Secondary | ICD-10-CM

## 2021-12-24 DIAGNOSIS — I739 Peripheral vascular disease, unspecified: Secondary | ICD-10-CM

## 2021-12-24 MED ORDER — IOHEXOL 350 MG/ML SOLN
120.0000 mL | Freq: Once | INTRAVENOUS | Status: AC | PRN
  Administered 2021-12-24: 120 mL via INTRAVENOUS

## 2021-12-28 ENCOUNTER — Ambulatory Visit: Payer: Medicare Other | Admitting: Surgery

## 2021-12-28 ENCOUNTER — Encounter: Payer: Self-pay | Admitting: Surgery

## 2021-12-28 VITALS — BP 123/79 | HR 75 | Temp 97.8°F | Resp 20 | Ht 68.0 in | Wt 165.0 lb

## 2021-12-28 DIAGNOSIS — I7143 Infrarenal abdominal aortic aneurysm, without rupture: Secondary | ICD-10-CM

## 2021-12-28 NOTE — Progress Notes (Signed)
Vascular and Vein Specialist of [place]  Patient name: Danny Duke MRN: [id number] DOB: May 05, 1935 Sex: male   REASON FOR VISIT:    Follow up  HISOTRY OF PRESENT ILLNESS:    Danny Duke is a 86 y.o. male who returns for follow-up of his abdominal aortic aneurysm.  He comes with a CT angiogram.  He denies any abdominal pain or back pain.  The patient has a history of lung cancer.  He is medically managed for hypertension.  He is a former smoker.  He is on a statin for hypercholesterolemia.   PAST MEDICAL HISTORY:   Past Medical History:  Diagnosis Date   AAA (abdominal aortic aneurysm) ([place])    Chronic back pain    Hypercholesterolemia    Hypertension    lung ca dx'd 09/2017   possible lung cancer     FAMILY HISTORY:   Family History  Problem Relation Age of Onset   Colon cancer Brother    Stomach cancer Brother     SOCIAL HISTORY:   Social History   Tobacco Use   Smoking status: Former    Packs/day: 0.50    Years: 40.00    Total pack years: 20.00    Types: Cigarettes    Quit date: 03/08/1997    Years since quitting: 24.8   Smokeless tobacco: Never  Substance Use Topics   Alcohol use: No     ALLERGIES:   No Known Allergies   CURRENT MEDICATIONS:   Current Outpatient Medications  Medication Sig Dispense Refill   acetaminophen (TYLENOL) 500 MG tablet Take 1,000 mg by mouth every 6 (six) hours as needed for moderate pain or headache.     amLODipine (NORVASC) 10 MG tablet Take 10 mg by mouth daily.     aspirin EC 81 MG tablet Take 81 mg by mouth daily.     DULoxetine (CYMBALTA) 60 MG capsule TAKE 1 CAPSULE(60 MG) BY MOUTH DAILY 30 capsule 3   gabapentin (NEURONTIN) 300 MG capsule TAKE 3 CAPSULES(900 MG) BY MOUTH THREE TIMES DAILY 120 capsule 6   gabapentin (NEURONTIN) 300 MG capsule TAKE 3 CAPSULES(900 MG) BY MOUTH THREE TIMES DAILY 120 capsule 6   losartan (COZAAR) 25 MG tablet Take 25 mg by mouth daily.       metoprolol succinate (TOPROL-XL) 25 MG 24 hr tablet Take 1 tablet (25 mg total) by mouth daily. 60 tablet 1   Oxycodone HCl 20 MG TABS Take 20 mg by mouth every 6 (six) hours as needed (pain).   0   simvastatin (ZOCOR) 20 MG tablet Take 20 mg by mouth daily.     No current facility-administered medications for this visit.    REVIEW OF SYSTEMS:   [X]  denotes positive finding, [ ]  denotes negative finding Cardiac  Comments:  Chest pain or chest pressure:    Shortness of breath upon exertion:    Short of breath when lying flat:    Irregular heart rhythm:        Vascular    Pain in calf, thigh, or hip brought on by ambulation:    Pain in feet at night that wakes you up from your sleep:     Blood clot in your veins:    Leg swelling:         Pulmonary    Oxygen at home:    Productive cough:     Wheezing:         Neurologic    Sudden weakness in arms  or legs:     Sudden numbness in arms or legs:     Sudden onset of difficulty speaking or slurred speech:    Temporary loss of vision in one eye:     Problems with dizziness:         Gastrointestinal    Blood in stool:     Vomited blood:         Genitourinary    Burning when urinating:     Blood in urine:        Psychiatric    Major depression:         Hematologic    Bleeding problems:    Problems with blood clotting too easily:        Skin    Rashes or ulcers:        Constitutional    Fever or chills:      PHYSICAL EXAM:   There were no vitals filed for this visit.  GENERAL: The patient is a well-nourished male, in no acute distress. The vital signs are documented above. CARDIAC: There is a regular rate and rhythm.  PULMONARY: Non-labored respirations ABDOMEN: Soft and non-tender MUSCULOSKELETAL: There are no major deformities or cyanosis. NEUROLOGIC: No focal weakness or paresthesias are detected. SKIN: There are no ulcers or rashes noted. PSYCHIATRIC: The patient has a normal affect.  STUDIES:   I have  reviewed his CTA with the following findings: Aneurysm disease, including:   -infrarenal abdominal aortic aneurysm estimated 5.1 cm, as above, increased from the prior PET-CT when the measurement was 3.9 cm. Aortic aneurysm NOS (ICD10-I71.9).   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 Aortic Atherosclerosis (ICD10-I70.0). Associated coronary artery disease.   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 Continued follow-up with oncology is recommended.   Similar appearance of background pulmonary changes including advanced emphysema, treatment effects, scarring/fibrosis. Emphysema (ICD10-J43.9).  MEDICAL ISSUES:   AAA: After reviewing his CT scan, I feel he is a good candidate for endovascular repair.  This is a 5.1-5.2 cm infrarenal aneurysm with a 3 cm left common iliac aneurysm.  He will likely require an iliac branch device.  We discussed the risks of the procedure including the risk of cardiopulmonary complications, intestinal ischemia, renal ischemia, lower extremity ischemia, wound complications.  All of his questions were answered.  He is going to find a date likely between Thanksgiving and Christmas to get this done.  All questions were answered.    Leia Alf, MD, FACS [facility] Vein Specialists of [facility] [phone] Pager [phone]

## 2021-12-29 ENCOUNTER — Other Ambulatory Visit: Payer: Self-pay

## 2021-12-29 DIAGNOSIS — I7143 Infrarenal abdominal aortic aneurysm, without rupture: Secondary | ICD-10-CM

## 2022-01-09 ENCOUNTER — Other Ambulatory Visit: Payer: Self-pay | Admitting: Internal Medicine

## 2022-01-25 NOTE — Progress Notes (Signed)
Surgical Instructions    Your procedure is scheduled on Wednesday, 02/03/22.  Report to [facility] Main Entrance "A" at 10:30 A.M., then check in with the Admitting office.  Call this number if you have problems the morning of surgery:  [phone]   If you have any questions prior to your surgery date call [phone]: Open Monday-Friday 8am-4pm If you experience any cold or flu symptoms such as cough, fever, chills, shortness of breath, etc. between now and your scheduled surgery, please notify us at the above number     Remember:  Do not eat or drink after midnight the night before your surgery     Take these medicines the morning of surgery with A SIP OF WATER:  allopurinol (ZYLOPRIM)  amLODipine (NORVASC)  DULoxetine (CYMBALTA)  gabapentin (NEURONTIN)  metoprolol succinate (TOPROL-XL)  simvastatin (ZOCOR)   IF NEEDED: acetaminophen (TYLENOL)  Oxycodone   As of today, STOP taking any Aspirin (unless otherwise instructed by your surgeon) Aleve, Naproxen, Ibuprofen, Motrin, Advil, Goody's, BC's, all herbal medications, fish oil, and all vitamins.  Please hold FARXIGA 72 hours prior to surgery. Your last dose will be 01/30/22.  Do not wear jewelry or makeup. Do not wear lotions, powders, cologne or deodorant. Men may shave face and neck. Do not bring valuables to the hospital. Do not wear nail polish, gel polish, artificial nails, or any other type of covering on natural nails (fingers and toes) If you have artificial nails or gel coating that need to be removed by a nail salon, please have this removed prior to surgery. Artificial nails or gel coating may interfere with anesthesia's ability to adequately monitor your vital signs.  [facility] is not responsible for any belongings or valuables.    Do NOT Smoke (Tobacco/Vaping)  24 hours prior to your procedure  If you use a CPAP at night, you may bring your mask for your overnight stay.   Contacts, glasses, hearing  aids, dentures or partials may not be worn into surgery, please bring cases for these belongings   For patients admitted to the hospital, discharge time will be determined by your treatment team.   Patients discharged the day of surgery will not be allowed to drive home, and someone needs to stay with them for 24 hours.   SURGICAL WAITING ROOM VISITATION Patients having surgery or a procedure may have no more than 2 support people in the waiting area - these visitors may rotate.   Children under the age of 2 must have an adult with them who is not the patient. If the patient needs to stay at the hospital during part of their recovery, the visitor guidelines for inpatient rooms apply. Pre-op nurse will coordinate an appropriate time for 1 support person to accompany patient in pre-op.  This support person may not rotate.   Please refer to [email or web address] for the visitor guidelines for Inpatients (after your surgery is over and you are in a regular room).    Special instructions:    Oral Hygiene is also important to reduce your risk of infection.  Remember - BRUSH YOUR TEETH THE MORNING OF SURGERY WITH YOUR REGULAR TOOTHPASTE   [place]- Preparing For Surgery  Before surgery, you can play an important role. Because skin is not sterile, your skin needs to be as free of germs as possible. You can reduce the number of germs on your skin by washing with CHG (chlorahexidine gluconate) Soap before surgery.  CHG is an antiseptic cleaner which kills  germs and bonds with the skin to continue killing germs even after washing.     Please do not use if you have an allergy to CHG or antibacterial soaps. If your skin becomes reddened/irritated stop using the CHG.  Do not shave (including legs and underarms) for at least 48 hours prior to first CHG shower. It is OK to shave your face.  Please follow these instructions carefully.     Shower the  NIGHT BEFORE SURGERY and the MORNING OF SURGERY with CHG Soap.   If you chose to wash your hair, wash your hair first as usual with your normal shampoo. After you shampoo, rinse your hair and body thoroughly to remove the shampoo.  Then [employer] and genitals (private parts) with your normal soap and rinse thoroughly to remove soap.  After that Use CHG Soap as you would any other liquid soap. You can apply CHG directly to the skin and wash gently with a scrungie or a clean washcloth.   Apply the CHG Soap to your body ONLY FROM THE NECK DOWN.  Do not use on open wounds or open sores. Avoid contact with your eyes, ears, mouth and genitals (private parts). Wash Face and genitals (private parts)  with your normal soap.   Wash thoroughly, paying special attention to the area where your surgery will be performed.  Thoroughly rinse your body with warm water from the neck down.  DO NOT shower/wash with your normal soap after using and rinsing off the CHG Soap.  Pat yourself dry with a CLEAN TOWEL.  Wear CLEAN PAJAMAS to bed the night before surgery  Place CLEAN SHEETS on your bed the night before your surgery  DO NOT SLEEP WITH PETS.   Day of Surgery: Take a shower with CHG soap. Wear Clean/Comfortable clothing the morning of surgery Do not apply any deodorants/lotions.   Remember to brush your teeth WITH YOUR REGULAR TOOTHPASTE.    If you received a COVID test during your pre-op visit, it is requested that you wear a mask when out in public, stay away from anyone that may not be feeling well, and notify your surgeon if you develop symptoms. If you have been in contact with anyone that has tested positive in the last 10 days, please notify your surgeon.    Please read over the following fact sheets that you were given.

## 2022-01-26 ENCOUNTER — Encounter (HOSPITAL_COMMUNITY): Payer: Self-pay

## 2022-01-26 ENCOUNTER — Encounter (HOSPITAL_COMMUNITY)
Admission: RE | Admit: 2022-01-26 | Discharge: 2022-01-26 | Disposition: A | Discharge status: Home / Self Care | Payer: Medicare Other | Source: Ambulatory Visit | Attending: Surgery | Admitting: Surgery

## 2022-01-26 ENCOUNTER — Other Ambulatory Visit: Payer: Self-pay

## 2022-01-26 VITALS — BP 136/77 | HR 89 | Temp 98.0°F | Resp 17 | Ht 68.0 in | Wt 162.7 lb

## 2022-01-26 DIAGNOSIS — I7143 Infrarenal abdominal aortic aneurysm, without rupture: Secondary | ICD-10-CM

## 2022-01-26 DIAGNOSIS — Z01818 Encounter for other preprocedural examination: Secondary | ICD-10-CM

## 2022-01-26 DIAGNOSIS — R9431 Abnormal electrocardiogram [ECG] [EKG]: Secondary | ICD-10-CM | POA: Insufficient documentation

## 2022-01-26 HISTORY — DX: Chronic kidney disease, unspecified: N18.9

## 2022-01-26 LAB — URINALYSIS, ROUTINE W REFLEX MICROSCOPIC
Bacteria, UA: NONE SEEN
Bilirubin Urine: NEGATIVE
Glucose, UA: 150 mg/dL — AB
Ketones, ur: NEGATIVE mg/dL
Leukocytes,Ua: NEGATIVE
Nitrite: NEGATIVE
Protein, ur: 30 mg/dL — AB
Specific Gravity, Urine: 1.016 (ref 1.005–1.030)
pH: 5 (ref 5.0–8.0)

## 2022-01-26 LAB — COMPREHENSIVE METABOLIC PANEL
ALT: 19 U/L (ref 0–44)
AST: 28 U/L (ref 15–41)
Albumin: 3.6 g/dL (ref 3.5–5.0)
Alkaline Phosphatase: 70 U/L (ref 38–126)
Anion gap: 7 (ref 5–15)
BUN: 12 mg/dL (ref 8–23)
CO2: 26 mmol/L (ref 22–32)
Calcium: 9.5 mg/dL (ref 8.9–10.3)
Chloride: 107 mmol/L (ref 98–111)
Creatinine, Ser: 1.36 mg/dL — ABNORMAL HIGH (ref 0.61–1.24)
GFR, Estimated: 51 mL/min — ABNORMAL LOW (ref >60)
Glucose, Bld: 72 mg/dL (ref 70–99)
Potassium: 4 mmol/L (ref 3.5–5.1)
Sodium: 140 mmol/L (ref 135–145)
Total Bilirubin: 0.4 mg/dL (ref 0.3–1.2)
Total Protein: 7.8 g/dL (ref 6.5–8.1)

## 2022-01-26 LAB — TYPE AND SCREEN
ABO/RH(D): O POS
Antibody Screen: NEGATIVE

## 2022-01-26 LAB — CBC
HCT: 42.4 % (ref 39.0–52.0)
Hemoglobin: 13.6 g/dL (ref 13.0–17.0)
MCH: 31.6 pg (ref 26.0–34.0)
MCHC: 32.1 g/dL (ref 30.0–36.0)
MCV: 98.6 fL (ref 80.0–100.0)
Platelets: 117 10*3/uL — ABNORMAL LOW (ref 150–400)
RBC: 4.3 MIL/uL (ref 4.22–5.81)
RDW: 13.3 % (ref 11.5–15.5)
WBC: 5.6 10*3/uL (ref 4.0–10.5)
nRBC: 0 % (ref 0.0–0.2)

## 2022-01-26 LAB — PROTIME-INR
INR: 1.2 (ref 0.8–1.2)
Prothrombin Time: 15.1 seconds (ref 11.4–15.2)

## 2022-01-26 LAB — APTT: aPTT: 36 seconds (ref 24–36)

## 2022-01-26 LAB — SURGICAL PCR SCREEN
MRSA, PCR: NEGATIVE
Staphylococcus aureus: POSITIVE — AB

## 2022-01-26 NOTE — Progress Notes (Signed)
Surgical Instructions    Your procedure is scheduled on Wednesday, 02/03/22.  Report to [facility] Main Entrance "A" at 10:30 A.M., then check in with the Admitting office.  Call this number if you have problems the morning of surgery:  [phone]   If you have any questions prior to your surgery date call [phone]: Open Monday-Friday 8am-4pm If you experience any cold or flu symptoms such as cough, fever, chills, shortness of breath, etc. between now and your scheduled surgery, please notify us at the above number     Remember:  Do not eat or drink after midnight the night before your surgery     Take these medicines the morning of surgery with A SIP OF WATER:  allopurinol (ZYLOPRIM)  amLODipine (NORVASC)  DULoxetine (CYMBALTA)  gabapentin (NEURONTIN)  metoprolol succinate (TOPROL-XL)  simvastatin (ZOCOR)   IF NEEDED: acetaminophen (TYLENOL)  Oxycodone   Per your surgeon's instructions continue taking Aspirin.  As of today, STOP taking any Aleve, Naproxen, Ibuprofen, Motrin, Advil, Goody's, BC's, all herbal medications, fish oil, and all vitamins.  Please hold FARXIGA 72 hours prior to surgery. Your last dose will be 01/30/22.  Do not wear jewelry or makeup. Do not wear lotions, powders, cologne or deodorant. Men may shave face and neck. Do not bring valuables to the hospital. Do not wear nail polish, gel polish, artificial nails, or any other type of covering on natural nails (fingers and toes) If you have artificial nails or gel coating that need to be removed by a nail salon, please have this removed prior to surgery. Artificial nails or gel coating may interfere with anesthesia's ability to adequately monitor your vital signs.  [facility] is not responsible for any belongings or valuables.    Do NOT Smoke (Tobacco/Vaping)  24 hours prior to your procedure  If you use a CPAP at night, you may bring your mask for your overnight stay.   Contacts, glasses,  hearing aids, dentures or partials may not be worn into surgery, please bring cases for these belongings   For patients admitted to the hospital, discharge time will be determined by your treatment team.   Patients discharged the day of surgery will not be allowed to drive home, and someone needs to stay with them for 24 hours.   SURGICAL WAITING ROOM VISITATION Patients having surgery or a procedure may have no more than 2 support people in the waiting area - these visitors may rotate.   Children under the age of 58 must have an adult with them who is not the patient. If the patient needs to stay at the hospital during part of their recovery, the visitor guidelines for inpatient rooms apply. Pre-op nurse will coordinate an appropriate time for 1 support person to accompany patient in pre-op.  This support person may not rotate.   Please refer to [email or web address] for the visitor guidelines for Inpatients (after your surgery is over and you are in a regular room).    Special instructions:    Oral Hygiene is also important to reduce your risk of infection.  Remember - BRUSH YOUR TEETH THE MORNING OF SURGERY WITH YOUR REGULAR TOOTHPASTE   [place]- Preparing For Surgery  Before surgery, you can play an important role. Because skin is not sterile, your skin needs to be as free of germs as possible. You can reduce the number of germs on your skin by washing with CHG (chlorahexidine gluconate) Soap before surgery.  CHG is an antiseptic cleaner which  kills germs and bonds with the skin to continue killing germs even after washing.     Please do not use if you have an allergy to CHG or antibacterial soaps. If your skin becomes reddened/irritated stop using the CHG.  Do not shave (including legs and underarms) for at least 48 hours prior to first CHG shower. It is OK to shave your face.  Please follow these instructions carefully.      Shower the NIGHT BEFORE SURGERY and the MORNING OF SURGERY with CHG Soap.   If you chose to wash your hair, wash your hair first as usual with your normal shampoo. After you shampoo, rinse your hair and body thoroughly to remove the shampoo.  Then [employer] and genitals (private parts) with your normal soap and rinse thoroughly to remove soap.  After that Use CHG Soap as you would any other liquid soap. You can apply CHG directly to the skin and wash gently with a scrungie or a clean washcloth.   Apply the CHG Soap to your body ONLY FROM THE NECK DOWN.  Do not use on open wounds or open sores. Avoid contact with your eyes, ears, mouth and genitals (private parts). Wash Face and genitals (private parts)  with your normal soap.   Wash thoroughly, paying special attention to the area where your surgery will be performed.  Thoroughly rinse your body with warm water from the neck down.  DO NOT shower/wash with your normal soap after using and rinsing off the CHG Soap.  Pat yourself dry with a CLEAN TOWEL.  Wear CLEAN PAJAMAS to bed the night before surgery  Place CLEAN SHEETS on your bed the night before your surgery  DO NOT SLEEP WITH PETS.   Day of Surgery: Take a shower with CHG soap. Wear Clean/Comfortable clothing the morning of surgery Do not apply any deodorants/lotions.   Remember to brush your teeth WITH YOUR REGULAR TOOTHPASTE.    If you received a COVID test during your pre-op visit, it is requested that you wear a mask when out in public, stay away from anyone that may not be feeling well, and notify your surgeon if you develop symptoms. If you have been in contact with anyone that has tested positive in the last 10 days, please notify your surgeon.    Please read over the following fact sheets that you were given.

## 2022-01-26 NOTE — Progress Notes (Signed)
Solon Palm, RN with Dr. Stephens [facility] office made aware of abnormal urinalysis and PCR Staph positive.

## 2022-01-26 NOTE — Progress Notes (Signed)
PCP - Basilio Cairo at [facility] Cardiologist - patient denies Oncologist- Dr. Curt Bears  PPM/ICD - n/a Device Orders - n/a Rep Notified - n/a  Chest x-ray - CTA chest 12/24/21 EKG - 01/26/22 Stress Test - patient denies ECHO - patient denies Cardiac Cath - patient denies  Sleep Study - denies CPAP - n/a  Fasting Blood Sugar - n/a Checks Blood Sugar _____ times a day- n/a  Per patient and his wife patient takes [place] for kidney disease.  Instructions: Stop taking Farxiga 72 hours prior to surgery. Last dose on 01/30/22.  Blood Thinner Instructions: n/a Aspirin Instructions: Per surgeon's instructions continue taking Aspirin  ERAS Protcol - NPO PRE-SURGERY Ensure or G2- n/a  COVID TEST- n/a   Anesthesia review: Yes.   Patient denies shortness of breath, fever, cough and chest pain at PAT appointment   All instructions explained to the patient, with a verbal understanding of the material. Patient agrees to go over the instructions while at home for a better understanding. Patient also instructed to self quarantine after being tested for COVID-19. The opportunity to ask questions was provided.

## 2022-01-27 ENCOUNTER — Encounter (HOSPITAL_COMMUNITY): Payer: Self-pay | Admitting: Surgery

## 2022-02-01 ENCOUNTER — Telehealth: Payer: Self-pay

## 2022-02-01 NOTE — Telephone Encounter (Signed)
Pt's wife called to make MD aware he had "black stool for a few days last week and saw his PCP who tested stool for blood, which was positive. Pt is scheduled to see GI in a week. Pt has not had any since dark stool since last week. MD has been made aware.

## 2022-02-03 ENCOUNTER — Encounter (HOSPITAL_COMMUNITY): Payer: Self-pay | Admitting: Surgery

## 2022-02-03 ENCOUNTER — Encounter (HOSPITAL_COMMUNITY)
Admission: RE | Disposition: A | Discharge status: Home / Self Care | Payer: Self-pay | Source: Home / Self Care | Attending: Surgery

## 2022-02-03 ENCOUNTER — Other Ambulatory Visit: Payer: Self-pay

## 2022-02-03 ENCOUNTER — Inpatient Hospital Stay (HOSPITAL_COMMUNITY)
Admission: RE | Admit: 2022-02-03 | Discharge: 2022-02-04 | DRG: 269 | Disposition: A | Discharge status: Home / Self Care | Payer: Medicare Other | Attending: Surgery | Admitting: Surgery

## 2022-02-03 ENCOUNTER — Inpatient Hospital Stay (HOSPITAL_COMMUNITY): Payer: Medicare Other

## 2022-02-03 ENCOUNTER — Inpatient Hospital Stay (HOSPITAL_COMMUNITY): Payer: Medicare Other | Admitting: Physician Assistant

## 2022-02-03 DIAGNOSIS — X58XXXA Exposure to other specified factors, initial encounter: Secondary | ICD-10-CM | POA: Diagnosis present

## 2022-02-03 DIAGNOSIS — Z87891 Personal history of nicotine dependence: Secondary | ICD-10-CM

## 2022-02-03 DIAGNOSIS — Z85118 Personal history of other malignant neoplasm of bronchus and lung: Secondary | ICD-10-CM | POA: Diagnosis not present

## 2022-02-03 DIAGNOSIS — Z9221 Personal history of antineoplastic chemotherapy: Secondary | ICD-10-CM | POA: Diagnosis not present

## 2022-02-03 DIAGNOSIS — Z902 Acquired absence of lung [part of]: Secondary | ICD-10-CM | POA: Diagnosis not present

## 2022-02-03 DIAGNOSIS — I714 Abdominal aortic aneurysm, without rupture, unspecified: Secondary | ICD-10-CM | POA: Diagnosis present

## 2022-02-03 DIAGNOSIS — J449 Chronic obstructive pulmonary disease, unspecified: Secondary | ICD-10-CM

## 2022-02-03 DIAGNOSIS — I7143 Infrarenal abdominal aortic aneurysm, without rupture: Secondary | ICD-10-CM

## 2022-02-03 DIAGNOSIS — I129 Hypertensive chronic kidney disease with stage 1 through stage 4 chronic kidney disease, or unspecified chronic kidney disease: Secondary | ICD-10-CM | POA: Diagnosis present

## 2022-02-03 DIAGNOSIS — I739 Peripheral vascular disease, unspecified: Secondary | ICD-10-CM | POA: Diagnosis present

## 2022-02-03 DIAGNOSIS — E7800 Pure hypercholesterolemia, unspecified: Secondary | ICD-10-CM | POA: Diagnosis present

## 2022-02-03 DIAGNOSIS — Z800 Family history of malignant neoplasm of digestive organs: Secondary | ICD-10-CM

## 2022-02-03 DIAGNOSIS — S301XXA Contusion of abdominal wall, initial encounter: Secondary | ICD-10-CM | POA: Diagnosis present

## 2022-02-03 DIAGNOSIS — I10 Essential (primary) hypertension: Secondary | ICD-10-CM

## 2022-02-03 DIAGNOSIS — N189 Chronic kidney disease, unspecified: Secondary | ICD-10-CM | POA: Diagnosis present

## 2022-02-03 DIAGNOSIS — N1831 Chronic kidney disease, stage 3a: Secondary | ICD-10-CM | POA: Diagnosis present

## 2022-02-03 DIAGNOSIS — Z79899 Other long term (current) drug therapy: Secondary | ICD-10-CM | POA: Diagnosis not present

## 2022-02-03 DIAGNOSIS — Z7982 Long term (current) use of aspirin: Secondary | ICD-10-CM | POA: Diagnosis not present

## 2022-02-03 HISTORY — PX: ABDOMINAL AORTIC ENDOVASCULAR STENT GRAFT: SHX5707

## 2022-02-03 LAB — POCT ACTIVATED CLOTTING TIME
Activated Clotting Time: 223 seconds
Activated Clotting Time: 244 seconds

## 2022-02-03 SURGERY — INSERTION, ENDOVASCULAR STENT GRAFT, AORTA, ABDOMINAL
Anesthesia: General | Site: Groin | Laterality: Right

## 2022-02-03 MED ORDER — PROPOFOL 10 MG/ML IV BOLUS
INTRAVENOUS | Status: DC | PRN
  Administered 2022-02-03: 160 mg via INTRAVENOUS

## 2022-02-03 MED ORDER — POTASSIUM CHLORIDE CRYS ER 20 MEQ PO TBCR: 20.0000 meq | EXTENDED_RELEASE_TABLET | Freq: Every day | ORAL | Status: DC | PRN

## 2022-02-03 MED ORDER — CEFAZOLIN SODIUM-DEXTROSE 2-4 GM/100ML-% IV SOLN
2.0000 g | Freq: Three times a day (TID) | INTRAVENOUS | Status: AC
  Administered 2022-02-03 – 2022-02-04 (×2): 2 g via INTRAVENOUS
  Filled 2022-02-03 (×2): qty 100

## 2022-02-03 MED ORDER — IODIXANOL 320 MG/ML IV SOLN
INTRAVENOUS | Status: DC | PRN
  Administered 2022-02-03: 100 mL

## 2022-02-03 MED ORDER — ALUM & MAG HYDROXIDE-SIMETH 200-200-20 MG/5ML PO SUSP: 15.0000 mL | ORAL | Status: DC | PRN

## 2022-02-03 MED ORDER — ROCURONIUM BROMIDE 10 MG/ML (PF) SYRINGE
PREFILLED_SYRINGE | INTRAVENOUS | Status: DC | PRN
  Administered 2022-02-03: 60 mg via INTRAVENOUS
  Administered 2022-02-03: 20 mg via INTRAVENOUS

## 2022-02-03 MED ORDER — 0.9 % SODIUM CHLORIDE (POUR BTL) OPTIME
TOPICAL | Status: DC | PRN
  Administered 2022-02-03: 1000 mL

## 2022-02-03 MED ORDER — AMLODIPINE BESYLATE 10 MG PO TABS
10.0000 mg | ORAL_TABLET | Freq: Every day | ORAL | Status: DC
  Administered 2022-02-04: 10 mg via ORAL
  Filled 2022-02-03: qty 1

## 2022-02-03 MED ORDER — LIDOCAINE 2% (20 MG/ML) 5 ML SYRINGE
INTRAMUSCULAR | Status: DC | PRN
  Administered 2022-02-03: 60 mg via INTRAVENOUS

## 2022-02-03 MED ORDER — FENTANYL CITRATE (PF) 100 MCG/2ML IJ SOLN: 25.0000 ug | INTRAMUSCULAR | Status: DC | PRN

## 2022-02-03 MED ORDER — VITAMIN D (ERGOCALCIFEROL) 1.25 MG (50000 UNIT) PO CAPS
50000.0000 [IU] | ORAL_CAPSULE | ORAL | Status: DC
  Administered 2022-02-04: 50000 [IU] via ORAL
  Filled 2022-02-03: qty 1

## 2022-02-03 MED ORDER — LABETALOL HCL 5 MG/ML IV SOLN: 10.0000 mg | INTRAVENOUS | Status: DC | PRN

## 2022-02-03 MED ORDER — OXYCODONE HCL 5 MG PO TABS: 20.0000 mg | ORAL_TABLET | Freq: Four times a day (QID) | ORAL | Status: DC | PRN

## 2022-02-03 MED ORDER — HEPARIN SODIUM (PORCINE) 1000 UNIT/ML IJ SOLN
INTRAMUSCULAR | Status: DC | PRN
  Administered 2022-02-03: 7000 [IU] via INTRAVENOUS

## 2022-02-03 MED ORDER — FENTANYL CITRATE (PF) 250 MCG/5ML IJ SOLN
INTRAMUSCULAR | Status: DC | PRN
  Administered 2022-02-03: 100 ug via INTRAVENOUS

## 2022-02-03 MED ORDER — ACETAMINOPHEN 650 MG RE SUPP: 325.0000 mg | RECTAL | Status: DC | PRN

## 2022-02-03 MED ORDER — SUGAMMADEX SODIUM 200 MG/2ML IV SOLN
INTRAVENOUS | Status: DC | PRN
  Administered 2022-02-03: 200 mg via INTRAVENOUS

## 2022-02-03 MED ORDER — CEFAZOLIN SODIUM-DEXTROSE 2-4 GM/100ML-% IV SOLN
2.0000 g | INTRAVENOUS | Status: AC
  Administered 2022-02-03: 2 g via INTRAVENOUS
  Filled 2022-02-03: qty 100

## 2022-02-03 MED ORDER — HEPARIN 6000 UNIT IRRIGATION SOLUTION
Status: AC
  Filled 2022-02-03: qty 500

## 2022-02-03 MED ORDER — LACTATED RINGERS IV SOLN: INTRAVENOUS | Status: DC | PRN

## 2022-02-03 MED ORDER — PHENYLEPHRINE HCL-NACL 20-0.9 MG/250ML-% IV SOLN
INTRAVENOUS | Status: DC | PRN
  Administered 2022-02-03: 40 ug/min via INTRAVENOUS

## 2022-02-03 MED ORDER — ONDANSETRON HCL 4 MG/2ML IJ SOLN
INTRAMUSCULAR | Status: DC | PRN
  Administered 2022-02-03: 4 mg via INTRAVENOUS

## 2022-02-03 MED ORDER — SODIUM CHLORIDE 0.9 % IV SOLN: 500.0000 mL | Freq: Once | INTRAVENOUS | Status: DC | PRN

## 2022-02-03 MED ORDER — ONDANSETRON HCL 4 MG/2ML IJ SOLN: 4.0000 mg | Freq: Four times a day (QID) | INTRAMUSCULAR | Status: DC | PRN

## 2022-02-03 MED ORDER — STERILE WATER FOR IRRIGATION IR SOLN
Status: DC | PRN
  Administered 2022-02-03: 1000 mL

## 2022-02-03 MED ORDER — SIMVASTATIN 20 MG PO TABS
20.0000 mg | ORAL_TABLET | Freq: Every day | ORAL | Status: DC
  Administered 2022-02-03: 20 mg via ORAL
  Filled 2022-02-03: qty 1

## 2022-02-03 MED ORDER — METOPROLOL TARTRATE 5 MG/5ML IV SOLN: 2.0000 mg | INTRAVENOUS | Status: DC | PRN

## 2022-02-03 MED ORDER — FENTANYL CITRATE (PF) 250 MCG/5ML IJ SOLN
INTRAMUSCULAR | Status: AC
  Filled 2022-02-03: qty 5

## 2022-02-03 MED ORDER — PROTAMINE SULFATE 10 MG/ML IV SOLN
INTRAVENOUS | Status: DC | PRN
  Administered 2022-02-03: 50 mg via INTRAVENOUS

## 2022-02-03 MED ORDER — DOCUSATE SODIUM 100 MG PO CAPS
100.0000 mg | ORAL_CAPSULE | Freq: Every day | ORAL | Status: DC
  Administered 2022-02-04: 100 mg via ORAL
  Filled 2022-02-03: qty 1

## 2022-02-03 MED ORDER — SODIUM CHLORIDE 0.9 % IV SOLN: INTRAVENOUS | Status: DC

## 2022-02-03 MED ORDER — MORPHINE SULFATE (PF) 2 MG/ML IV SOLN: 2.0000 mg | INTRAVENOUS | Status: DC | PRN

## 2022-02-03 MED ORDER — DEXAMETHASONE SODIUM PHOSPHATE 10 MG/ML IJ SOLN
INTRAMUSCULAR | Status: DC | PRN
  Administered 2022-02-03: 10 mg via INTRAVENOUS

## 2022-02-03 MED ORDER — PANTOPRAZOLE SODIUM 40 MG PO TBEC
40.0000 mg | DELAYED_RELEASE_TABLET | Freq: Every day | ORAL | Status: DC
  Administered 2022-02-04: 40 mg via ORAL
  Filled 2022-02-03: qty 1

## 2022-02-03 MED ORDER — DAPAGLIFLOZIN PROPANEDIOL 10 MG PO TABS
10.0000 mg | ORAL_TABLET | Freq: Every day | ORAL | Status: DC
  Administered 2022-02-04: 10 mg via ORAL
  Filled 2022-02-03: qty 1

## 2022-02-03 MED ORDER — ACETAMINOPHEN 500 MG PO TABS: 1000.0000 mg | ORAL_TABLET | Freq: Four times a day (QID) | ORAL | Status: DC | PRN

## 2022-02-03 MED ORDER — ACETAMINOPHEN 325 MG PO TABS: 325.0000 mg | ORAL_TABLET | ORAL | Status: DC | PRN

## 2022-02-03 MED ORDER — GABAPENTIN 300 MG PO CAPS
900.0000 mg | ORAL_CAPSULE | Freq: Three times a day (TID) | ORAL | Status: DC
  Administered 2022-02-03 – 2022-02-04 (×3): 900 mg via ORAL
  Filled 2022-02-03 (×3): qty 3

## 2022-02-03 MED ORDER — DULOXETINE HCL 60 MG PO CPEP
60.0000 mg | ORAL_CAPSULE | Freq: Every day | ORAL | Status: DC
  Administered 2022-02-04: 60 mg via ORAL
  Filled 2022-02-03: qty 1

## 2022-02-03 MED ORDER — PROPOFOL 10 MG/ML IV BOLUS
INTRAVENOUS | Status: AC
  Filled 2022-02-03: qty 20

## 2022-02-03 MED ORDER — HEPARIN 6000 UNIT IRRIGATION SOLUTION
Status: DC | PRN
  Administered 2022-02-03: 1

## 2022-02-03 MED ORDER — ASPIRIN 81 MG PO TBEC
81.0000 mg | DELAYED_RELEASE_TABLET | Freq: Every day | ORAL | Status: DC
  Administered 2022-02-04: 81 mg via ORAL
  Filled 2022-02-03: qty 1

## 2022-02-03 MED ORDER — CHLORHEXIDINE GLUCONATE CLOTH 2 % EX PADS: 6.0000 | MEDICATED_PAD | Freq: Once | CUTANEOUS | Status: DC

## 2022-02-03 MED ORDER — CHLORHEXIDINE GLUCONATE 0.12 % MT SOLN
OROMUCOSAL | Status: AC
  Administered 2022-02-03: 15 mL
  Filled 2022-02-03: qty 15

## 2022-02-03 MED ORDER — HYDRALAZINE HCL 20 MG/ML IJ SOLN: 5.0000 mg | INTRAMUSCULAR | Status: DC | PRN

## 2022-02-03 MED ORDER — SENNOSIDES-DOCUSATE SODIUM 8.6-50 MG PO TABS: 1.0000 | ORAL_TABLET | Freq: Every evening | ORAL | Status: DC | PRN

## 2022-02-03 MED ORDER — PHENOL 1.4 % MT LIQD: 1.0000 | OROMUCOSAL | Status: DC | PRN

## 2022-02-03 MED ORDER — OXYCODONE HCL 5 MG PO TABS: 5.0000 mg | ORAL_TABLET | Freq: Once | ORAL | Status: DC | PRN

## 2022-02-03 MED ORDER — BISACODYL 5 MG PO TBEC: 5.0000 mg | DELAYED_RELEASE_TABLET | Freq: Every day | ORAL | Status: DC | PRN

## 2022-02-03 MED ORDER — HEPARIN SODIUM (PORCINE) 5000 UNIT/ML IJ SOLN
5000.0000 [IU] | Freq: Three times a day (TID) | INTRAMUSCULAR | Status: DC
  Administered 2022-02-03 – 2022-02-04 (×2): 5000 [IU] via SUBCUTANEOUS
  Filled 2022-02-03 (×2): qty 1

## 2022-02-03 MED ORDER — OXYCODONE HCL 5 MG/5ML PO SOLN: 5.0000 mg | Freq: Once | ORAL | Status: DC | PRN

## 2022-02-03 MED ORDER — METOPROLOL SUCCINATE ER 25 MG PO TB24
25.0000 mg | ORAL_TABLET | Freq: Every day | ORAL | Status: DC
  Administered 2022-02-03 – 2022-02-04 (×2): 25 mg via ORAL
  Filled 2022-02-03 (×2): qty 1

## 2022-02-03 MED ORDER — GUAIFENESIN-DM 100-10 MG/5ML PO SYRP: 15.0000 mL | ORAL_SOLUTION | ORAL | Status: DC | PRN

## 2022-02-03 MED ORDER — MAGNESIUM SULFATE 2 GM/50ML IV SOLN: 2.0000 g | Freq: Every day | INTRAVENOUS | Status: DC | PRN

## 2022-02-03 MED ORDER — ALLOPURINOL 100 MG PO TABS
100.0000 mg | ORAL_TABLET | Freq: Every day | ORAL | Status: DC
  Administered 2022-02-04: 100 mg via ORAL
  Filled 2022-02-03: qty 1

## 2022-02-03 SURGICAL SUPPLY — 53 items
BAG COUNTER SPONGE SURGICOUNT (BAG) ×1 IMPLANT
BLADE CLIPPER SURG (BLADE) ×1 IMPLANT
CANISTER SUCT 3000ML PPV (MISCELLANEOUS) ×1 IMPLANT
CATH BEACON 5.038 65CM KMP-01 (CATHETERS) ×1 IMPLANT
CATH OMNI FLUSH .035X70CM (CATHETERS) ×1 IMPLANT
DERMABOND ADVANCED .7 DNX12 (GAUZE/BANDAGES/DRESSINGS) ×1 IMPLANT
DEVICE CLOSURE PERCLS PRGLD 6F (VASCULAR PRODUCTS) ×4 IMPLANT
DEVICE TORQUE H2O (MISCELLANEOUS) IMPLANT
DRSG TEGADERM 2-3/8X2-3/4 SM (GAUZE/BANDAGES/DRESSINGS) ×2 IMPLANT
DRYSEAL FLEXSHEATH 14FR 33CM (SHEATH) ×1
DRYSEAL FLEXSHEATH 16FR 33CM (SHEATH) ×1
ELECT CAUTERY BLADE 6.4 (BLADE) ×1 IMPLANT
ELECT REM PT RETURN 9FT ADLT (ELECTROSURGICAL) ×2
ELECTRODE REM PT RTRN 9FT ADLT (ELECTROSURGICAL) ×2 IMPLANT
EXCLUDER TNK 26X14.5MMX12CM (Endovascular Graft) IMPLANT
EXCLUDER TRUNK 26X14.5MMX12CM (Endovascular Graft) ×1 IMPLANT
GAUZE SPONGE 2X2 8PLY STRL LF (GAUZE/BANDAGES/DRESSINGS) ×2 IMPLANT
GLOVE SURG SS PI 7.5 STRL IVOR (GLOVE) ×3 IMPLANT
GOWN STRL REUS W/ TWL LRG LVL3 (GOWN DISPOSABLE) ×2 IMPLANT
GOWN STRL REUS W/ TWL XL LVL3 (GOWN DISPOSABLE) ×2 IMPLANT
GOWN STRL REUS W/TWL LRG LVL3 (GOWN DISPOSABLE) ×2
GOWN STRL REUS W/TWL XL LVL3 (GOWN DISPOSABLE) ×2
GRAFT BALLN CATH 65CM (STENTS) ×1 IMPLANT
GUIDEWIRE ANGLED .035X150CM (WIRE) IMPLANT
KIT BASIN OR (CUSTOM PROCEDURE TRAY) ×1 IMPLANT
KIT DRAIN CSF ACCUDRAIN (MISCELLANEOUS) IMPLANT
KIT TURNOVER KIT B (KITS) ×1 IMPLANT
LEG CONTRALATERAL 23X12 (Endovascular Graft) IMPLANT
LEG CONTRALATERAL 27X12 (Vascular Products) IMPLANT
NS IRRIG 1000ML POUR BTL (IV SOLUTION) ×1 IMPLANT
PACK ENDOVASCULAR (PACKS) ×1 IMPLANT
PAD ARMBOARD 7.5X6 YLW CONV (MISCELLANEOUS) ×2 IMPLANT
PENCIL BUTTON HOLSTER BLD 10FT (ELECTRODE) ×1 IMPLANT
PERCLOSE PROGLIDE 6F (VASCULAR PRODUCTS) ×4
SET MICROPUNCTURE 5F STIFF (MISCELLANEOUS) ×1 IMPLANT
SHEATH BRITE TIP 8FR 23CM (SHEATH) ×1 IMPLANT
SHEATH DRYSEAL FLEX 14FR 33CM (SHEATH) IMPLANT
SHEATH DRYSEAL FLEX 16FR 33CM (SHEATH) IMPLANT
SHEATH PINNACLE 8F 10CM (SHEATH) ×1 IMPLANT
STENT GRAFT BALLN CATH 65CM (STENTS) ×1
STOPCOCK MORSE 400PSI 3WAY (MISCELLANEOUS) ×1 IMPLANT
SUT PROLENE 5 0 C 1 24 (SUTURE) IMPLANT
SUT VIC AB 2-0 CT1 27 (SUTURE)
SUT VIC AB 2-0 CT1 TAPERPNT 27 (SUTURE) IMPLANT
SUT VIC AB 3-0 SH 27 (SUTURE)
SUT VIC AB 3-0 SH 27X BRD (SUTURE) IMPLANT
SUT VICRYL 4-0 PS2 18IN ABS (SUTURE) IMPLANT
SYR 20ML LL LF (SYRINGE) ×1 IMPLANT
TOWEL GREEN STERILE (TOWEL DISPOSABLE) ×1 IMPLANT
TRAY FOLEY MTR SLVR 16FR STAT (SET/KITS/TRAYS/PACK) ×1 IMPLANT
TUBING HIGH PRESSURE 120CM (CONNECTOR) ×1 IMPLANT
WIRE AMPLATZ SS-J .035X180CM (WIRE) ×2 IMPLANT
WIRE BENTSON .035X145CM (WIRE) ×2 IMPLANT

## 2022-02-03 NOTE — Op Note (Signed)
Patient name: Danny Duke MRN: [id number] DOB: [birth year] Sex: male  02/03/2022 Pre-operative Diagnosis: AAA Post-operative diagnosis:  Same Surgeon:  [name] Assistants:  Laurence Slate, PA Procedure:   #1: Endovascular repair of abdominal aortic aneurysm   #2: Bilateral ultrasound-guided common femoral artery access   #3: Catheter in aorta x2   #4: Abdominal aortogram   #5: S & I codes Anesthesia:  General Blood Loss:  minimal Specimens:  none  Findings: Complete exclusion  Devices used: Main body was a Gore [phone], ipsilateral left was a 27x12, contralateral right was a 23x12.  Indications: This is an 86 year old gentleman with greater than 5 cm infrarenal abdominal aortic aneurysm who comes in today for repair.  Procedure:  The patient was identified in the holding area and taken to [facility]  The patient was then placed supine on the table. general anesthesia was administered.  The patient was prepped and draped in the usual sterile fashion.  A time out was called and antibiotics were administered.  A PA was necessary to expedite the procedure and assist with technical details.  She helped with wire exchanges, device deployment, and groin closure.  Ultrasound was used to evaluate bilateral common femoral arteries which are widely patent.  A #11 blade was used to make a skin nick bilaterally.  Bilateral common femoral arteries were cannulated under ultrasound guidance with a micropuncture needle.  A 018 wire was advanced without resistance followed by placement of micropuncture sheath.  A Bentson wire was then inserted bilaterally.  The subcutaneous tracts were dilated with 8 [place] dilator.  Pro-glide devices were deployed at the 11:00 and 1 o'clock position for free closure and 8 French sheaths were placed.  Using catheters, Amplatz Super Stiff wires were placed into the descending thoracic aorta.  A 16 French dry seal sheath was placed up the left side and a 14 French  dry seal placed on the right side.  The patient was fully heparinized.  Next, the main body was prepared on the back table.  This was a Gore 26 x 14 x 12 device.  It was advanced up the left side into the intrarenal abdominal aorta.  A pigtail catheter was placed up the right side.  A abdominal aortogram was performed locating the renal arteries.  Next, the main body was deployed landing just below the renal arteries.  The contralateral gate was then cannulated with a Berenstein 2 catheter and a Bentson wire.  A pigtail catheter was then advanced into the main body of the graft and able to be freely rotated, confirming successful cannulation.  A Amplatz Super Stiff wire was then placed.  The image detector was rotated to a right anterior oblique position and a retrograde injection was performed through the sheath which located the right hypogastric artery.  The contralateral right extension was then inserted.  This was a Gore 23 x 12 device.  It was deployed just proximal to the origin of the right hypogastric artery.  Next the remaining portion of the ipsilateral limb was deployed.  The delivery system was removed.  The image detector was rotated to a right anterior oblique position and a retrograde injection through the sheath was performed locating the left hypogastric artery.  The ipsilateral extension was prepared on the back table.  This was a Gore 27 x 12 device.  It was deployed landing just above the origin of the left hypogastric artery.  Next, a MOB balloon was used to mold  the proximal and distal attachment sites as well as device overlap.  A completion arteriogram was then performed which showed successful exclusion of the aneurysm with no evidence of endoleak and preserved patency of the bilateral renal and bilateral hypogastric and external iliac arteries.  Bentson wires were then placed up both sides.  Both sheaths were removed and the Pro-glide devices were used to close the arteriotomy site.  The  patient was checked for pedal Doppler signals which were brisk.  Cautery was used on the subcutaneous tissue followed by Dermabond.  The patient was successfully extubated taken recovery in stable condition.  There were no immediate complications.   Disposition: To PACU stable.   Theotis Burrow, M.D., [facility] [place] Office: [phone] Pager:  [phone]

## 2022-02-03 NOTE — Progress Notes (Signed)
Patient arrived to 4E from PACU. Vitals taken and stable. Patient placed on tele and CCMD notified. Patient oriented to unit and staff. Small right sided hematoma marked and MD/PA notified in PACU. Will continue to monitor. Call bell within reach. Danny Duke

## 2022-02-03 NOTE — Consult Note (Signed)
[facility] [place]   Patient name: Danny Duke         MRN: [id number]        DOB: 01/28/1936            Sex: male     REASON FOR VISIT:      Follow up   HISOTRY OF PRESENT ILLNESS:      Danny Duke is a 86 y.o. male who returns for follow-up of his abdominal aortic aneurysm.  He comes with a CT angiogram.  He denies any abdominal pain or back pain.   The patient has a history of lung cancer.  He is medically managed for hypertension.  He is a former smoker.  He is on a statin for hypercholesterolemia.     PAST MEDICAL HISTORY:        Past Medical History:  Diagnosis Date   AAA (abdominal aortic aneurysm) ([place])     Chronic back pain     Hypercholesterolemia     Hypertension     lung ca dx'd 09/2017    possible lung cancer        FAMILY HISTORY:         Family History  Problem Relation Age of Onset   Colon cancer Brother     Stomach cancer Brother        SOCIAL HISTORY:    Social History         Tobacco Use   Smoking status: Former      Packs/day: 0.50      Years: 40.00      Total pack years: 20.00      Types: Cigarettes      Quit date: 03/08/1997      Years since quitting: 24.8   Smokeless tobacco: Never  Substance Use Topics   Alcohol use: No        ALLERGIES:    No Known Allergies     CURRENT MEDICATIONS:          Current Outpatient Medications  Medication Sig Dispense Refill   acetaminophen (TYLENOL) 500 MG tablet Take 1,000 mg by mouth every 6 (six) hours as needed for moderate pain or headache.       amLODipine (NORVASC) 10 MG tablet Take 10 mg by mouth daily.       aspirin EC 81 MG tablet Take 81 mg by mouth daily.       DULoxetine (CYMBALTA) 60 MG capsule TAKE 1 CAPSULE(60 MG) BY MOUTH DAILY 30 capsule 3   gabapentin (NEURONTIN) 300 MG capsule TAKE 3 CAPSULES(900 MG) BY MOUTH THREE TIMES DAILY 120 capsule 6   gabapentin (NEURONTIN) 300 MG capsule TAKE 3 CAPSULES(900 MG) BY MOUTH THREE TIMES DAILY  120 capsule 6   losartan (COZAAR) 25 MG tablet Take 25 mg by mouth daily.        metoprolol succinate (TOPROL-XL) 25 MG 24 hr tablet Take 1 tablet (25 mg total) by mouth daily. 60 tablet 1   Oxycodone HCl 20 MG TABS Take 20 mg by mouth every 6 (six) hours as needed (pain).    0   simvastatin (ZOCOR) 20 MG tablet Take 20 mg by mouth daily.        No current facility-administered medications for this visit.      REVIEW OF SYSTEMS:    [X]  denotes positive finding, [ ]  denotes negative finding Cardiac   Comments:  Chest pain or chest pressure:  Shortness of breath upon exertion:      Short of breath when lying flat:      Irregular heart rhythm:             Vascular      Pain in calf, thigh, or hip brought on by ambulation:      Pain in feet at night that wakes you up from your sleep:       Blood clot in your veins:      Leg swelling:              Pulmonary      Oxygen at home:      Productive cough:       Wheezing:              Neurologic      Sudden weakness in arms or legs:       Sudden numbness in arms or legs:       Sudden onset of difficulty speaking or slurred speech:      Temporary loss of vision in one eye:       Problems with dizziness:              Gastrointestinal      Blood in stool:       Vomited blood:              Genitourinary      Burning when urinating:       Blood in urine:             Psychiatric      Major depression:              Hematologic      Bleeding problems:      Problems with blood clotting too easily:             Skin      Rashes or ulcers:             Constitutional      Fever or chills:          PHYSICAL EXAM:    There were no vitals filed for this visit.   GENERAL: The patient is a well-nourished male, in no acute distress. The vital signs are documented above. CARDIAC: There is a regular rate and rhythm.  PULMONARY: Non-labored respirations ABDOMEN: Soft and non-tender MUSCULOSKELETAL: There are no major deformities  or cyanosis. NEUROLOGIC: No focal weakness or paresthesias are detected. SKIN: There are no ulcers or rashes noted. PSYCHIATRIC: The patient has a normal affect.   STUDIES:    I have reviewed his CTA with the following findings: Aneurysm disease, including:   -infrarenal abdominal aortic aneurysm estimated 5.1 cm, as above, increased from the prior PET-CT when the measurement was 3.9 cm. Aortic aneurysm NOS (ICD10-I71.9).   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 Aortic Atherosclerosis (ICD10-I70.0). Associated coronary artery disease.   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 Continued follow-up with oncology is recommended.   Similar appearance of background pulmonary changes including advanced emphysema, treatment effects, scarring/fibrosis. Emphysema (ICD10-J43.9).   MEDICAL ISSUES:    AAA: After reviewing his  CT scan, I feel he is a good candidate for endovascular repair.  This is a 5.1-5.2 cm infrarenal aneurysm with a 3 cm left common iliac aneurysm.  He will likely require an iliac branch device.  We discussed the risks of the procedure including the risk of cardiopulmonary complications, intestinal ischemia, renal ischemia, lower extremity ischemia, wound complications.  All of his questions were answered.  He is going to find a date likely between Thanksgiving and Christmas to get this done.  All questions were answered.       Leia Alf, MD, FACS [facility] [phone] Pager [phone]

## 2022-02-03 NOTE — Anesthesia Preprocedure Evaluation (Signed)
Anesthesia Evaluation  Patient identified by MRN, date of birth, ID band Patient awake    Reviewed: Allergy & Precautions, H&P , NPO status , Patient's Chart, lab work & pertinent test results  Airway Mallampati: II   Neck ROM: full    Dental   Pulmonary COPD, former smoker H/o lung CA s/p resection   breath sounds clear to auscultation       Cardiovascular hypertension, + Peripheral Vascular Disease   Rhythm:regular Rate:Normal  Infrarenal AAA 5.1 cm   Neuro/Psych  Neuromuscular disease    GI/Hepatic   Endo/Other    Renal/GU Renal InsufficiencyRenal disease     Musculoskeletal   Abdominal   Peds  Hematology   Anesthesia Other Findings   Reproductive/Obstetrics                             Anesthesia Physical Anesthesia Plan  ASA: 3  Anesthesia Plan: General   Post-op Pain Management:    Induction: Intravenous  PONV Risk Score and Plan: 2 and Ondansetron, Dexamethasone and Treatment may vary due to age or medical condition  Airway Management Planned: Oral ETT  Additional Equipment: Arterial line  Intra-op Plan:   Post-operative Plan: Extubation in OR  Informed Consent: I have reviewed the patients History and Physical, chart, labs and discussed the procedure including the risks, benefits and alternatives for the proposed anesthesia with the patient or authorized representative who has indicated his/her understanding and acceptance.     Dental advisory given  Plan Discussed with: CRNA, Anesthesiologist and Surgeon  Anesthesia Plan Comments:        Anesthesia Quick Evaluation

## 2022-02-03 NOTE — Progress Notes (Signed)
  Day of Surgery Note    Subjective:  no complaints   Vitals:   02/03/22 1545 02/03/22 1600  BP: 135/87 134/86  Pulse: 83 87  Resp: 15 15  Temp:    SpO2:      Incisions:   right groin with very small hematoma; left groin soft Extremities:  palpable pedal pulses bilaterally Cardiac:  regular Lungs:  non labored Abdomen:  soft   Assessment/Plan:  This is a 86 y.o. male who is s/p  EVAR  -doing well this evening. Very small hematoma right groin but soft.   -palpable pedal pulses bilaterally -anticipate discharge tomorrow if no events overnight   Leontine Locket, PA-C 02/03/2022 4:24 PM [phone]

## 2022-02-03 NOTE — Anesthesia Procedure Notes (Signed)
Arterial Line Insertion Start/End11/29/2023 10:45 AM, 02/03/2022 10:50 AM Performed by: Colin Benton, CRNA, CRNA  Patient location: Pre-op. Preanesthetic checklist: patient identified, IV checked, site marked, risks and benefits discussed, surgical consent, monitors and equipment checked, pre-op evaluation, timeout performed and anesthesia consent Lidocaine 1% used for infiltration Right, radial was placed Catheter size: 20 G Hand hygiene performed , maximum sterile barriers used  and Seldinger technique used Allen's test indicative of satisfactory collateral circulation Attempts: 1 Procedure performed without using ultrasound guided technique. Following insertion, dressing applied and Biopatch. Post procedure assessment: normal and unchanged  Patient tolerated the procedure well with no immediate complications.

## 2022-02-03 NOTE — Transfer of Care (Signed)
Immediate Anesthesia Transfer of Care Note  Patient: Danny Duke  Procedure(s) Performed: ABDOMINAL AORTIC ENDOVASCULAR STENT GRAFT (Groin)  Patient Location: PACU  Anesthesia Type:General  Level of Consciousness: awake, alert , and oriented  Airway & Oxygen Therapy: Patient Spontanous Breathing and Patient connected to face mask oxygen  Post-op Assessment: Report given to RN and Post -op Vital signs reviewed and stable  Post vital signs: Reviewed and stable  Last Vitals:  Vitals Value Taken Time  BP 147/94 02/03/22 1340  Temp    Pulse 82 02/03/22 1343  Resp 11 02/03/22 1343  SpO2 100 % 02/03/22 1343  Vitals shown include unvalidated device data.  Last Pain:  Vitals:   02/03/22 1042  TempSrc:   PainSc: 0-No pain         Complications: No notable events documented.

## 2022-02-03 NOTE — Anesthesia Procedure Notes (Signed)
Procedure Name: Intubation Date/Time: 02/03/2022 11:58 AM  Performed by: Griffin Dakin, CRNAPre-anesthesia Checklist: Patient identified, Emergency Drugs available, Suction available and Patient being monitored Patient Re-evaluated:Patient Re-evaluated prior to induction Oxygen Delivery Method: Circle system utilized Preoxygenation: Pre-oxygenation with 100% oxygen Induction Type: IV induction Ventilation: Mask ventilation without difficulty Laryngoscope Size: Mac and 4 Grade View: Grade I Tube type: Oral Number of attempts: 1 Airway Equipment and Method: Stylet Placement Confirmation: ETT inserted through vocal cords under direct vision, positive ETCO2 and breath sounds checked- equal and bilateral Secured at: 23 cm Tube secured with: Tape Dental Injury: Teeth and Oropharynx as per pre-operative assessment

## 2022-02-04 LAB — CBC
HCT: 35.6 % — ABNORMAL LOW (ref 39.0–52.0)
Hemoglobin: 11.9 g/dL — ABNORMAL LOW (ref 13.0–17.0)
MCH: 31.6 pg (ref 26.0–34.0)
MCHC: 33.4 g/dL (ref 30.0–36.0)
MCV: 94.4 fL (ref 80.0–100.0)
Platelets: 113 10*3/uL — ABNORMAL LOW (ref 150–400)
RBC: 3.77 MIL/uL — ABNORMAL LOW (ref 4.22–5.81)
RDW: 13.2 % (ref 11.5–15.5)
WBC: 7.6 10*3/uL (ref 4.0–10.5)
nRBC: 0 % (ref 0.0–0.2)

## 2022-02-04 LAB — BASIC METABOLIC PANEL
Anion gap: 12 (ref 5–15)
BUN: 16 mg/dL (ref 8–23)
CO2: 20 mmol/L — ABNORMAL LOW (ref 22–32)
Calcium: 9.2 mg/dL (ref 8.9–10.3)
Chloride: 106 mmol/L (ref 98–111)
Creatinine, Ser: 1.31 mg/dL — ABNORMAL HIGH (ref 0.61–1.24)
GFR, Estimated: 54 mL/min — ABNORMAL LOW (ref >60)
Glucose, Bld: 129 mg/dL — ABNORMAL HIGH (ref 70–99)
Potassium: 4.4 mmol/L (ref 3.5–5.1)
Sodium: 138 mmol/L (ref 135–145)

## 2022-02-04 NOTE — Plan of Care (Signed)
  Problem: Health Behavior/Discharge Planning: Goal: Ability to manage health-related needs will improve Outcome: Adequate for Discharge Discharged to go home

## 2022-02-04 NOTE — Discharge Instructions (Signed)
  [facility] [place]   Discharge Instructions  Endovascular Aortic Aneurysm Repair  Please refer to the following instructions for your post-procedure care. Your surgeon or Physician Assistant will discuss any changes with you.  Activity  You are encouraged to walk as much as you can. You can slowly return to normal activities but must avoid strenuous activity and heavy lifting until your doctor tells you it's OK. Avoid activities such as vacuuming or swinging a gold club. It is normal to feel tired for several weeks after your surgery. Do not drive until your doctor gives the OK and you are no longer taking prescription pain medications. It is also normal to have difficulty with sleep habits, eating, and bowel movements after surgery. These will go away with time.  Bathing/Showering  Shower daily after you go home.  Do not soak in a bathtub, hot tub, or swim until the incision heals completely.  If you have incisions in your groin, wash the groin wounds with soap and water daily and pat dry. (No tub bath-only shower)  Then put a dry gauze or washcloth there to keep this area dry to help prevent wound infection daily and as needed.  Do not use Vaseline or neosporin on your incisions.  Only use soap and water on your incisions and then protect and keep dry.  Incision Care  Shower every day. Clean your incision with mild soap and water. Pat the area dry with a clean towel. You do not need a bandage unless otherwise instructed. Do not apply any ointments or creams to your incision. If you clothing is irritating, you may cover your incision with a dry gauze pad.  Diet  Resume your normal diet. There are no special food restrictions following this procedure. A low fat/low cholesterol diet is recommended for all patients with vascular disease. In order to heal from your surgery, it is CRITICAL to get adequate nutrition. Your body requires vitamins, minerals, and protein.  Vegetables are the best source of vitamins and minerals. Vegetables also provide the perfect balance of protein. Processed food has little nutritional value, so try to avoid this.  Medications  Resume taking all of your medications unless your doctor or nurse practitioner tells you not to. If your incision is causing pain, you may take over-the-counter pain relievers such as acetaminophen (Tylenol). If you were prescribed a stronger pain medication, please be aware these medications can cause nausea and constipation. Prevent nausea by taking the medication with a snack or meal. Avoid constipation by drinking plenty of fluids and eating foods with a high amount of fiber, such as fruits, vegetables, and grains.  Do not take Tylenol if you are taking prescription pain medications.   Follow up  Our office will schedule a follow-up appointment with a CT scan 3-4 weeks after your surgery.  Please call us immediately for any of the following conditions  Severe or worsening pain in your legs or feet or in your abdomen back or chest. Increased pain, redness, drainage (pus) from your incision site. Increased abdominal pain, bloating, nausea, vomiting or persistent diarrhea. Fever of 101 degrees or higher. Swelling in your leg (s),  Reduce your risk of vascular disease  Stop smoking. If you would like help call [facility] at 1-800-QUIT-NOW (1-800-784-8669) or [place] at 336-586-4000. Manage your cholesterol Maintain a desired weight Control your diabetes Keep your blood pressure down  If you have questions, please call the office at [phone].  

## 2022-02-04 NOTE — Interval H&P Note (Signed)
History and Physical Interval Note:  02/04/2022 9:14 AM  Danny Duke  has presented today for surgery, with the diagnosis of Infrarenal abdominal aortic aneurysm without rupture.  The various methods of treatment have been discussed with the patient and family. After consideration of risks, benefits and other options for treatment, the patient has consented to  Procedure(s): ABDOMINAL AORTIC ENDOVASCULAR STENT GRAFT (Right) as a surgical intervention.  The patient's history has been reviewed, patient examined, no change in status, stable for surgery.  I have reviewed the patient's chart and labs.  Questions were answered to the patient's satisfaction.     [name]

## 2022-02-04 NOTE — Progress Notes (Signed)
Mobility Specialist Progress Note:   02/04/22 0929  Mobility  Activity Ambulated with assistance in hallway  Level of Assistance Standby assist, set-up cues, supervision of patient - no hands on  Assistive Device None  Distance Ambulated (ft) 500 ft  Activity Response Tolerated well  $Mobility charge 1 Mobility   Pt received in bed willing to participate in mobility. No complaints of pain. Left in bed with call bell in reach and all needs met.   Gareth Eagle Stepahnie Campo Mobility Specialist Please contact via [facility] [employer] or  Rehab Office at [phone]

## 2022-02-04 NOTE — Anesthesia Postprocedure Evaluation (Signed)
Anesthesia Post Note  Patient: Danny Duke  Procedure(s) Performed: ABDOMINAL AORTIC ENDOVASCULAR STENT GRAFT (Right: Groin)     Patient location during evaluation: PACU Anesthesia Type: General Level of consciousness: awake and alert Pain management: pain level controlled Vital Signs Assessment: post-procedure vital signs reviewed and stable Respiratory status: spontaneous breathing, nonlabored ventilation, respiratory function stable and patient connected to nasal cannula oxygen Cardiovascular status: blood pressure returned to baseline and stable Postop Assessment: no apparent nausea or vomiting Anesthetic complications: no   No notable events documented.  Last Vitals:  Vitals:   02/04/22 0400 02/04/22 0836  BP: 125/78 111/82  Pulse: 68 90  Resp: 16 16  Temp: 36.9 C 36.8 C  SpO2: 98% 100%    Last Pain:  Vitals:   02/04/22 0836  TempSrc: Oral  PainSc:                  [place]

## 2022-02-04 NOTE — Progress Notes (Addendum)
  Progress Note    02/04/2022 6:37 AM 1 Day Post-Op  Subjective:  no complaints; has voided and walked.    Afebrile HR 60's-90's NSR 497'W-263'Z systolic 85% RA  Gtts:  none  Vitals:   02/04/22 0202 02/04/22 0400  BP: 117/77 125/78  Pulse: 72 68  Resp: 16 16  Temp: 98.5 F (36.9 C) 98.4 F (36.9 C)  SpO2: 98% 98%    Physical Exam: General:  no distress Cardiac:  regular Lungs:  non labored Incisions:  very small hematoma right groin unchanged from yesterday; left groin soft. Extremities:  palpable pedal pulses bilaterally Abdomen:  soft; NT  CBC    Component Value Date/Time   WBC 7.6 02/04/2022 0322   RBC 3.77 (L) 02/04/2022 0322   HGB 11.9 (L) 02/04/2022 0322   HGB 13.2 08/24/2021 1451   HCT 35.6 (L) 02/04/2022 0322   PLT 113 (L) 02/04/2022 0322   PLT 125 (L) 08/24/2021 1451   MCV 94.4 02/04/2022 0322   MCH 31.6 02/04/2022 0322   MCHC 33.4 02/04/2022 0322   RDW 13.2 02/04/2022 0322   LYMPHSABS 1.8 08/24/2021 1451   MONOABS 0.6 08/24/2021 1451   EOSABS 0.2 08/24/2021 1451   BASOSABS 0.0 08/24/2021 1451    BMET    Component Value Date/Time   NA 138 02/04/2022 0322   K 4.4 02/04/2022 0322   CL 106 02/04/2022 0322   CO2 20 (L) 02/04/2022 0322   GLUCOSE 129 (H) 02/04/2022 0322   BUN 16 02/04/2022 0322   CREATININE 1.31 (H) 02/04/2022 0322   CREATININE 1.51 (H) 08/24/2021 1451   CALCIUM 9.2 02/04/2022 0322   GFRNONAA 54 (L) 02/04/2022 0322   GFRNONAA 46 (L) 08/24/2021 1451   GFRAA 57 (L) 09/18/2019 1106    INR    Component Value Date/Time   INR 1.2 01/26/2022 1225     Intake/Output Summary (Last 24 hours) at 02/04/2022 8850 Last data filed at 02/04/2022 0500 Gross per 24 hour  Intake 1751.43 ml  Output 2655 ml  Net -903.57 ml      Assessment/Plan:  86 y.o. male is s/p:  EVAR  1 Day Post-Op   -pt doing well this morning with palpable pedal pulses bilaterally  -right groin with very small hematoma that is unchanged from  yesterday.   -pt has walked and voided.   -PDMP reviewed.  Pt does get narcotic pain medication regularly and therefore will not prescribe at discharge.   -creatinine remains stable this morning at 1.31 -follow up with Dr. Trula Slade in 4 weeks with CTA a/p   Leontine Locket, PA-C [facility] [phone] 02/04/2022 6:37 AM  Looks great, anticipate d/c this am  [name]

## 2022-02-04 NOTE — Discharge Summary (Signed)
EVAR Discharge Summary   Danny Duke 06/21/1935 86 y.o. male  MRN: [id number]  Admission Date: 02/03/2022  Discharge Date: 02/04/2022  Physician: Serafina Mitchell, MD  Admission Diagnosis: AAA (abdominal aortic aneurysm) ([place]) [I71.40]   HPI:   This is a 86 y.o. male  who returns for follow-up of his abdominal aortic aneurysm.  He comes with a CT angiogram.  He denies any abdominal pain or back pain.   The patient has a history of lung cancer.  He is medically managed for hypertension.  He is a former smoker.  He is on a statin for hypercholesterolemia.    Hospital Course:  The patient was admitted to the hospital and taken to the operating room on 02/03/2022 and underwent: Procedure:   #1: Endovascular repair of abdominal aortic aneurysm                       #2: Bilateral ultrasound-guided common femoral artery access                       #3: Catheter in aorta x2                       #4: Abdominal aortogram                       #5: S & I codes    Findings: Complete exclusion  The pt tolerated the procedure well and was transported to the PACU in good condition.   By POD 1, pt doing well.   He has a very small hematoma in the right groin that was unchanged from afternoon of surgery.  He had palpable pedal pulses.   Creatinine remained stable.   He was doing well and was discharged home.    CBC    Component Value Date/Time   WBC 7.6 02/04/2022 0322   RBC 3.77 (L) 02/04/2022 0322   HGB 11.9 (L) 02/04/2022 0322   HGB 13.2 08/24/2021 1451   HCT 35.6 (L) 02/04/2022 0322   PLT 113 (L) 02/04/2022 0322   PLT 125 (L) 08/24/2021 1451   MCV 94.4 02/04/2022 0322   MCH 31.6 02/04/2022 0322   MCHC 33.4 02/04/2022 0322   RDW 13.2 02/04/2022 0322   LYMPHSABS 1.8 08/24/2021 1451   MONOABS 0.6 08/24/2021 1451   EOSABS 0.2 08/24/2021 1451   BASOSABS 0.0 08/24/2021 1451    BMET    Component Value Date/Time   NA 138 02/04/2022 0322   K 4.4 02/04/2022 0322   CL  106 02/04/2022 0322   CO2 20 (L) 02/04/2022 0322   GLUCOSE 129 (H) 02/04/2022 0322   BUN 16 02/04/2022 0322   CREATININE 1.31 (H) 02/04/2022 0322   CREATININE 1.51 (H) 08/24/2021 1451   CALCIUM 9.2 02/04/2022 0322   GFRNONAA 54 (L) 02/04/2022 0322   GFRNONAA 46 (L) 08/24/2021 1451   GFRAA 57 (L) 09/18/2019 1106       Discharge Instructions     Discharge patient   Complete by: As directed    Discharge home later this morning and after he has walked, voided and been seen by Dr. Trula Slade.  Thanks   Discharge disposition: 01-Home or Self Care   Discharge patient date: 02/04/2022       Discharge Diagnosis:  AAA (abdominal aortic aneurysm) ([facility] [facility]) [I71.40]  Secondary Diagnosis: Patient Active Problem List   Diagnosis Date Noted   AAA (abdominal aortic  aneurysm) ([place]) 02/03/2022   Chemotherapy-induced neuropathy ([place]) 05/18/2018   Goals of care, counseling/discussion 12/13/2017   Encounter for antineoplastic chemotherapy 12/13/2017   Status post surgery 09/19/2017   S/P lobectomy of lung 09/19/2017   Squamous cell carcinoma lung, right ([place])    COPD GOLD I  07/13/2017   Essential hypertension 07/13/2017   Mass of middle lobe of right lung 07/12/2017   Past Medical History:  Diagnosis Date   AAA (abdominal aortic aneurysm) ([facility])    Chronic back pain    Chronic kidney disease    Hypercholesterolemia    Hypertension    Right Lung dx'd 09/2017   s/p RMLobectomy 2019 and adjuvant chemotherapy and SBRT     Allergies as of 02/04/2022   No Known Allergies      Medication List     TAKE these medications    acetaminophen 500 MG tablet Commonly known as: TYLENOL Take 1,000 mg by mouth every 6 (six) hours as needed for moderate pain or headache.   allopurinol 100 MG tablet Commonly known as: ZYLOPRIM Take 100 mg by mouth daily.   amLODipine 10 MG tablet Commonly known as: NORVASC Take 10 mg by mouth daily.   aspirin EC 81 MG tablet Take 81 mg by mouth daily.    DULoxetine 60 MG capsule Commonly known as: CYMBALTA TAKE 1 CAPSULE(60 MG) BY MOUTH DAILY   Farxiga 10 MG Tabs tablet Generic drug: dapagliflozin propanediol Take 10 mg by mouth daily.   gabapentin 300 MG capsule Commonly known as: NEURONTIN TAKE 3 CAPSULES(900 MG) BY MOUTH THREE TIMES DAILY   metoprolol succinate 25 MG 24 hr tablet Commonly known as: TOPROL-XL Take 1 tablet (25 mg total) by mouth daily.   Oxycodone HCl 20 MG Tabs Take 20 mg by mouth every 6 (six) hours as needed (pain).   simvastatin 20 MG tablet Commonly known as: ZOCOR Take 20 mg by mouth daily.   Vitamin D (Ergocalciferol) 1.25 MG (50000 UNIT) Caps capsule Commonly known as: DRISDOL Take 50,000 Units by mouth once a week.        Discharge Instructions:  [facility]  Discharge Instructions Endovascular Aortic Aneurysm Repair  Please refer to the following instructions for your post-procedure care. Your surgeon or Physician Assistant will discuss any changes with you.  Activity  You are encouraged to walk as much as you can. You can slowly return to normal activities but must avoid strenuous activity and heavy lifting until your doctor tells you it's OK. Avoid activities such as vacuuming or swinging a gold club. It is normal to feel tired for several weeks after your surgery. Do not drive until your doctor gives the OK and you are no longer taking prescription pain medications. It is also normal to have difficulty with sleep habits, eating, and bowel movements after surgery. These will go away with time.  Bathing/Showering  You may shower after you go home. If you have an incision, do not soak in a bathtub, hot tub, or swim until the incision heals completely.  Incision Care  Shower every day. Clean your incision with mild soap and water. Pat the area dry with a clean towel. You do not need a bandage unless otherwise instructed. Do not apply any ointments or creams  to your incision. If you clothing is irritating, you may cover your incision with a dry gauze pad.  Diet  Resume your normal diet. There are no special food restrictions following this procedure. A low  fat/low cholesterol diet is recommended for all patients with vascular disease. In order to heal from your surgery, it is CRITICAL to get adequate nutrition. Your body requires vitamins, minerals, and protein. Vegetables are the best source of vitamins and minerals. Vegetables also provide the perfect balance of protein. Processed food has little nutritional value, so try to avoid this.  Medications  Resume taking all of your medications unless your doctor or Physician Assistnat tells you not to. If your incision is causing pain, you may take over-the-counter pain relievers such as acetaminophen (Tylenol). If you were prescribed a stronger pain medication, please be aware these medications can cause nausea and constipation. Prevent nausea by taking the medication with a snack or meal. Avoid constipation by drinking plenty of fluids and eating foods with a high amount of fiber, such as fruits, vegetables, and grains.  Do not take Tylenol if you are taking prescription pain medications.   Follow up  [place] office will schedule a follow-up appointment with a C.T. scan 3-4 weeks after your surgery.  Please call us immediately for any of the following conditions  Severe or worsening pain in your legs or feet or in your abdomen back or chest. Increased pain, redness, drainage (pus) from your incision sit. Increased abdominal pain, bloating, nausea, vomiting or persistent diarrhea. Fever of 101 degrees or higher. Swelling in your leg (s),  Reduce your risk of vascular disease  Stop smoking. If you would like help call [facility] at 1-800-QUIT-NOW (249)074-7270) or [place] at [phone]. Manage your cholesterol Maintain a desired weight Control your diabetes Keep your blood pressure  down  If you have questions, please call the office at [phone].   Prescriptions given: 1. None-has pain management  Disposition: home  Patient's condition: is Good  Follow up: 1. Dr. Trula Slade in 4 weeks with CTA protocol   Leontine Locket, PA-C [facility] [phone] 02/04/2022  9:07 AM   - For VQI Registry use - Post-op:  Time to Extubation: [x]  In OR, [ ]  < 12 hrs, [ ]  12-24 hrs, [ ]  >=24 hrs Vasopressors Req. Post-op: No MI: No., [ ]  Troponin only, [ ]  EKG or Clinical New Arrhythmia: No CHF: No ICU Stay: 1 day in progressive Transfusion: No     If yes, n/a units given  Complications: Resp failure: No., [ ]  Pneumonia, [ ]  Ventilator Chg in renal function: No., [ ]  Inc. Cr > 0.5, [ ]  Temp. Dialysis,  [ ]  Permanent dialysis Leg ischemia: No., no Surgery needed, [ ]  Yes, Surgery needed,  [ ]  Amputation Bowel ischemia: No., [ ]  Medical Rx, [ ]  Surgical Rx Wound complication: No., [ ]  Superficial separation/infection, [ ]  Return to OR Return to OR: No  Return to OR for bleeding: No Stroke: No., [ ]  Minor, [ ]  Major  Discharge medications: Statin use:  Yes  ASA use:  Yes  Plavix use:  No  Beta blocker use:  Yes  ARB use:  No ACEI use:  No CCB use:  Yes

## 2022-02-04 NOTE — H&P (View-Only) (Signed)
  Progress Note    02/04/2022 6:37 AM 1 Day Post-Op  Subjective:  no complaints; has voided and walked.    Afebrile HR 60's-90's NSR 270'B-867'J systolic 44% RA  Gtts:  none  Vitals:   02/04/22 0202 02/04/22 0400  BP: 117/77 125/78  Pulse: 72 68  Resp: 16 16  Temp: 98.5 F (36.9 C) 98.4 F (36.9 C)  SpO2: 98% 98%    Physical Exam: General:  no distress Cardiac:  regular Lungs:  non labored Incisions:  very small hematoma right groin unchanged from yesterday; left groin soft. Extremities:  palpable pedal pulses bilaterally Abdomen:  soft; NT  CBC    Component Value Date/Time   WBC 7.6 02/04/2022 0322   RBC 3.77 (L) 02/04/2022 0322   HGB 11.9 (L) 02/04/2022 0322   HGB 13.2 08/24/2021 1451   HCT 35.6 (L) 02/04/2022 0322   PLT 113 (L) 02/04/2022 0322   PLT 125 (L) 08/24/2021 1451   MCV 94.4 02/04/2022 0322   MCH 31.6 02/04/2022 0322   MCHC 33.4 02/04/2022 0322   RDW 13.2 02/04/2022 0322   LYMPHSABS 1.8 08/24/2021 1451   MONOABS 0.6 08/24/2021 1451   EOSABS 0.2 08/24/2021 1451   BASOSABS 0.0 08/24/2021 1451    BMET    Component Value Date/Time   NA 138 02/04/2022 0322   K 4.4 02/04/2022 0322   CL 106 02/04/2022 0322   CO2 20 (L) 02/04/2022 0322   GLUCOSE 129 (H) 02/04/2022 0322   BUN 16 02/04/2022 0322   CREATININE 1.31 (H) 02/04/2022 0322   CREATININE 1.51 (H) 08/24/2021 1451   CALCIUM 9.2 02/04/2022 0322   GFRNONAA 54 (L) 02/04/2022 0322   GFRNONAA 46 (L) 08/24/2021 1451   GFRAA 57 (L) 09/18/2019 1106    INR    Component Value Date/Time   INR 1.2 01/26/2022 1225     Intake/Output Summary (Last 24 hours) at 02/04/2022 9201 Last data filed at 02/04/2022 0500 Gross per 24 hour  Intake 1751.43 ml  Output 2655 ml  Net -903.57 ml      Assessment/Plan:  86 y.o. male is s/p:  EVAR  1 Day Post-Op   -pt doing well this morning with palpable pedal pulses bilaterally  -right groin with very small hematoma that is unchanged from  yesterday.   -pt has walked and voided.   -PDMP reviewed.  Pt does get narcotic pain medication regularly and therefore will not prescribe at discharge.   -creatinine remains stable this morning at 1.31 -follow up with Dr. Trula Slade in 4 weeks with CTA a/p   Leontine Locket, PA-C [facility] [phone] 02/04/2022 6:37 AM  Looks great, anticipate d/c this am  [name]

## 2022-02-05 ENCOUNTER — Encounter (HOSPITAL_COMMUNITY): Payer: Self-pay | Admitting: Surgery

## 2022-02-18 ENCOUNTER — Telehealth: Payer: Self-pay | Admitting: Internal Medicine

## 2022-02-18 NOTE — Telephone Encounter (Signed)
Rescheduled 12/26 appointment to 12/27 due to provider pal, patient has been notified.

## 2022-02-22 ENCOUNTER — Other Ambulatory Visit: Payer: Self-pay

## 2022-02-22 DIAGNOSIS — I7143 Infrarenal abdominal aortic aneurysm, without rupture: Secondary | ICD-10-CM

## 2022-02-25 ENCOUNTER — Inpatient Hospital Stay: Payer: Medicare Other | Attending: Internal Medicine

## 2022-02-25 ENCOUNTER — Ambulatory Visit (HOSPITAL_COMMUNITY)
Admission: RE | Admit: 2022-02-25 | Discharge: 2022-02-25 | Disposition: A | Discharge status: Home / Self Care | Payer: Medicare Other | Source: Ambulatory Visit | Attending: Physician Assistant | Admitting: Physician Assistant

## 2022-02-25 ENCOUNTER — Encounter (HOSPITAL_COMMUNITY): Payer: Self-pay

## 2022-02-25 DIAGNOSIS — I2510 Atherosclerotic heart disease of native coronary artery without angina pectoris: Secondary | ICD-10-CM | POA: Insufficient documentation

## 2022-02-25 DIAGNOSIS — Z79899 Other long term (current) drug therapy: Secondary | ICD-10-CM | POA: Insufficient documentation

## 2022-02-25 DIAGNOSIS — C3491 Malignant neoplasm of unspecified part of right bronchus or lung: Secondary | ICD-10-CM | POA: Insufficient documentation

## 2022-02-25 DIAGNOSIS — Z7982 Long term (current) use of aspirin: Secondary | ICD-10-CM | POA: Insufficient documentation

## 2022-02-25 DIAGNOSIS — I714 Abdominal aortic aneurysm, without rupture, unspecified: Secondary | ICD-10-CM | POA: Insufficient documentation

## 2022-02-25 DIAGNOSIS — E7800 Pure hypercholesterolemia, unspecified: Secondary | ICD-10-CM | POA: Insufficient documentation

## 2022-02-25 DIAGNOSIS — Z7984 Long term (current) use of oral hypoglycemic drugs: Secondary | ICD-10-CM | POA: Insufficient documentation

## 2022-02-25 DIAGNOSIS — J439 Emphysema, unspecified: Secondary | ICD-10-CM | POA: Insufficient documentation

## 2022-02-25 DIAGNOSIS — I10 Essential (primary) hypertension: Secondary | ICD-10-CM | POA: Insufficient documentation

## 2022-02-25 DIAGNOSIS — I700 Atherosclerosis of aorta: Secondary | ICD-10-CM | POA: Insufficient documentation

## 2022-02-25 DIAGNOSIS — C3431 Malignant neoplasm of lower lobe, right bronchus or lung: Secondary | ICD-10-CM | POA: Insufficient documentation

## 2022-02-25 DIAGNOSIS — K449 Diaphragmatic hernia without obstruction or gangrene: Secondary | ICD-10-CM | POA: Insufficient documentation

## 2022-02-25 LAB — CBC WITH DIFFERENTIAL (CANCER CENTER ONLY)
Abs Immature Granulocytes: 0.01 10*3/uL (ref 0.00–0.07)
Basophils Absolute: 0 10*3/uL (ref 0.0–0.1)
Basophils Relative: 0 %
Eosinophils Absolute: 0.2 10*3/uL (ref 0.0–0.5)
Eosinophils Relative: 4 %
HCT: 36.8 % — ABNORMAL LOW (ref 39.0–52.0)
Hemoglobin: 12.3 g/dL — ABNORMAL LOW (ref 13.0–17.0)
Immature Granulocytes: 0 %
Lymphocytes Relative: 27 %
Lymphs Abs: 1.4 10*3/uL (ref 0.7–4.0)
MCH: 31.8 pg (ref 26.0–34.0)
MCHC: 33.4 g/dL (ref 30.0–36.0)
MCV: 95.1 fL (ref 80.0–100.0)
Monocytes Absolute: 0.7 10*3/uL (ref 0.1–1.0)
Monocytes Relative: 13 %
Neutro Abs: 2.9 10*3/uL (ref 1.7–7.7)
Neutrophils Relative %: 56 %
Platelet Count: 155 10*3/uL (ref 150–400)
RBC: 3.87 MIL/uL — ABNORMAL LOW (ref 4.22–5.81)
RDW: 13.4 % (ref 11.5–15.5)
WBC Count: 5.3 10*3/uL (ref 4.0–10.5)
nRBC: 0 % (ref 0.0–0.2)

## 2022-02-25 LAB — CMP (CANCER CENTER ONLY)
ALT: 12 U/L (ref 0–44)
AST: 20 U/L (ref 15–41)
Albumin: 3.6 g/dL (ref 3.5–5.0)
Alkaline Phosphatase: 82 U/L (ref 38–126)
Anion gap: 5 (ref 5–15)
BUN: 13 mg/dL (ref 8–23)
CO2: 27 mmol/L (ref 22–32)
Calcium: 9.3 mg/dL (ref 8.9–10.3)
Chloride: 108 mmol/L (ref 98–111)
Creatinine: 1.37 mg/dL — ABNORMAL HIGH (ref 0.61–1.24)
GFR, Estimated: 51 mL/min — ABNORMAL LOW (ref >60)
Glucose, Bld: 102 mg/dL — ABNORMAL HIGH (ref 70–99)
Potassium: 4.1 mmol/L (ref 3.5–5.1)
Sodium: 140 mmol/L (ref 135–145)
Total Bilirubin: 0.4 mg/dL (ref 0.3–1.2)
Total Protein: 7.5 g/dL (ref 6.5–8.1)

## 2022-03-02 ENCOUNTER — Inpatient Hospital Stay: Payer: Medicare Other | Admitting: Internal Medicine

## 2022-03-03 ENCOUNTER — Inpatient Hospital Stay: Payer: Medicare Other | Admitting: Internal Medicine

## 2022-03-03 ENCOUNTER — Other Ambulatory Visit: Payer: Self-pay

## 2022-03-03 VITALS — BP 114/78 | HR 83 | Temp 98.3°F | Resp 16 | Wt 163.4 lb

## 2022-03-03 DIAGNOSIS — Z79899 Other long term (current) drug therapy: Secondary | ICD-10-CM | POA: Diagnosis not present

## 2022-03-03 DIAGNOSIS — J439 Emphysema, unspecified: Secondary | ICD-10-CM | POA: Diagnosis not present

## 2022-03-03 DIAGNOSIS — C3490 Malignant neoplasm of unspecified part of unspecified bronchus or lung: Secondary | ICD-10-CM

## 2022-03-03 DIAGNOSIS — C3431 Malignant neoplasm of lower lobe, right bronchus or lung: Secondary | ICD-10-CM | POA: Diagnosis not present

## 2022-03-03 DIAGNOSIS — I10 Essential (primary) hypertension: Secondary | ICD-10-CM | POA: Diagnosis not present

## 2022-03-03 DIAGNOSIS — I2510 Atherosclerotic heart disease of native coronary artery without angina pectoris: Secondary | ICD-10-CM | POA: Diagnosis not present

## 2022-03-03 DIAGNOSIS — I714 Abdominal aortic aneurysm, without rupture, unspecified: Secondary | ICD-10-CM | POA: Diagnosis not present

## 2022-03-03 DIAGNOSIS — Z7984 Long term (current) use of oral hypoglycemic drugs: Secondary | ICD-10-CM | POA: Diagnosis not present

## 2022-03-03 DIAGNOSIS — E7800 Pure hypercholesterolemia, unspecified: Secondary | ICD-10-CM | POA: Diagnosis not present

## 2022-03-03 DIAGNOSIS — I700 Atherosclerosis of aorta: Secondary | ICD-10-CM | POA: Diagnosis not present

## 2022-03-03 DIAGNOSIS — K449 Diaphragmatic hernia without obstruction or gangrene: Secondary | ICD-10-CM | POA: Diagnosis not present

## 2022-03-03 DIAGNOSIS — Z7982 Long term (current) use of aspirin: Secondary | ICD-10-CM | POA: Diagnosis not present

## 2022-03-03 NOTE — Progress Notes (Signed)
[place] [phone]   Fax:([phone]  OFFICE PROGRESS NOTE  Tilda Franco, FNP [address] [place] [address]  DIAGNOSIS: Recurrent non-small cell lung cancer, squamous cell carcinoma presented with right lower lobe lung nodule in August 2021 initially diagnosed as stage IIB (T3, N0, M0) non-small cell lung cancer, invasive well-differentiated squamous cell carcinoma presented with right middle lobe lung mass.  PRIOR THERAPY:  1) Status post right VATS, right middle lobectomy with mediastinal lymph node sampling under the care of Dr. Roxan Hockey on 09/19/2017.  The tumor measured 4.2 cm but the carcinoma extends through the visceral pleura. 2) Adjuvant systemic chemotherapy with carboplatin for AUC of 6 and paclitaxel 200 mg/M2 every 3 weeks.  First dose 11/16/2017.  Status post 3 cycles.  Last dose was giving January 05, 2018 discontinued secondary to intolerance with significant peripheral neuropathy. 3) curative radiotherapy to the right lower lobe pulmonary nodule under the care of Dr. Tammi Klippel completed on December 21, 2019.  CURRENT THERAPY: Observation.  INTERVAL HISTORY: Danny Duke 86 y.o. male returns to the clinic today for follow-up visit accompanied by his wife.  The patient is feeling fine today with no concerning complaints.  He denied having any fatigue or weakness.  He has no nausea, vomiting, diarrhea or constipation.  He has no headache or visual changes.  He denied having any significant weight loss or night sweats.  He had repeat CT scan of the chest performed recently and is here for evaluation and discussion of his scan results.  MEDICAL HISTORY: Past Medical History:  Diagnosis Date   AAA (abdominal aortic aneurysm) ([place])    Chronic back pain    Chronic kidney disease    Hypercholesterolemia    Hypertension    Right Lung dx'd 09/2017   s/p RMLobectomy 2019 and adjuvant chemotherapy and SBRT    ALLERGIES:   has No Known Allergies.  MEDICATIONS:  Current Outpatient Medications  Medication Sig Dispense Refill   acetaminophen (TYLENOL) 500 MG tablet Take 1,000 mg by mouth every 6 (six) hours as needed for moderate pain or headache.     allopurinol (ZYLOPRIM) 100 MG tablet Take 100 mg by mouth daily.     amLODipine (NORVASC) 10 MG tablet Take 10 mg by mouth daily.     aspirin EC 81 MG tablet Take 81 mg by mouth daily.     DULoxetine (CYMBALTA) 60 MG capsule TAKE 1 CAPSULE(60 MG) BY MOUTH DAILY 30 capsule 3   FARXIGA 10 MG TABS tablet Take 10 mg by mouth daily.     gabapentin (NEURONTIN) 300 MG capsule TAKE 3 CAPSULES(900 MG) BY MOUTH THREE TIMES DAILY 120 capsule 6   metoprolol succinate (TOPROL-XL) 25 MG 24 hr tablet Take 1 tablet (25 mg total) by mouth daily. 60 tablet 1   Oxycodone HCl 20 MG TABS Take 20 mg by mouth every 6 (six) hours as needed (pain).   0   simvastatin (ZOCOR) 20 MG tablet Take 20 mg by mouth daily.     Vitamin D, Ergocalciferol, (DRISDOL) 1.25 MG (50000 UNIT) CAPS capsule Take 50,000 Units by mouth once a week.     No current facility-administered medications for this visit.    SURGICAL HISTORY:  Past Surgical History:  Procedure Laterality Date   ABDOMINAL AORTIC ENDOVASCULAR STENT GRAFT Right 02/03/2022   Procedure: ABDOMINAL AORTIC ENDOVASCULAR STENT GRAFT;  Surgeon: Serafina Mitchell, MD;  Location: [facility];  Service: Vascular;  Laterality: Right;  BRONCHIAL NEEDLE ASPIRATION BIOPSY  08/05/2017   Procedure: BRONCHIAL NEEDLE ASPIRATION BIOPSIES;  Surgeon: Juanito Doom, MD;  Location: [facility];  Service: Cardiopulmonary;;   ENDOBRONCHIAL ULTRASOUND Bilateral 08/05/2017   Procedure: ENDOBRONCHIAL ULTRASOUND;  Surgeon: Juanito Doom, MD;  Location: [facility];  Service: Cardiopulmonary;  Laterality: Bilateral;   IR IMAGING GUIDED PORT INSERTION  12/27/2017   IR REMOVAL TUN ACCESS W/ PORT W/O FL MOD SED  03/25/2020   MEDIASTINOSCOPY N/A 09/19/2017    Procedure: MEDIASTINOSCOPY;  Surgeon: [name], MD;  Location: [facility];  Service: Thoracic;  Laterality: N/A;   MULTIPLE TOOTH EXTRACTIONS     [place] (VATS)/ LOBECTOMY Right 09/19/2017   Procedure: RIGHT VIDEO ASSISTED THORACOSCOPY (VATS)/ LOBECTOMY;  Surgeon: [name], MD;  Location: [facility];  Service: Thoracic;  Laterality: Right;    REVIEW OF SYSTEMS:  A comprehensive review of systems was negative.   PHYSICAL EXAMINATION: General appearance: alert, cooperative, and no distress Head: Normocephalic, without obvious abnormality, atraumatic Neck: no adenopathy, no JVD, supple, symmetrical, trachea midline, and thyroid not enlarged, symmetric, no tenderness/mass/nodules Lymph nodes: Cervical, supraclavicular, and axillary nodes normal. Resp: clear to auscultation bilaterally Back: symmetric, no curvature. ROM normal. No CVA tenderness. Cardio: regular rate and rhythm, S1, S2 normal, no murmur, click, rub or gallop GI: soft, non-tender; bowel sounds normal; no masses,  no organomegaly Extremities: extremities normal, atraumatic, no cyanosis or edema  ECOG PERFORMANCE STATUS: 1 - Symptomatic but completely ambulatory  Blood pressure 114/78, pulse 83, temperature 98.3 F (36.8 C), temperature source Oral, resp. rate 16, weight 163 lb 6.4 oz (74.1 kg), SpO2 100 %.  LABORATORY DATA: Lab Results  Component Value Date   WBC 5.3 02/25/2022   HGB 12.3 (L) 02/25/2022   HCT 36.8 (L) 02/25/2022   MCV 95.1 02/25/2022   PLT 155 02/25/2022      Chemistry      Component Value Date/Time   NA 140 02/25/2022 1044   K 4.1 02/25/2022 1044   CL 108 02/25/2022 1044   CO2 27 02/25/2022 1044   BUN 13 02/25/2022 1044   CREATININE 1.37 (H) 02/25/2022 1044      Component Value Date/Time   CALCIUM 9.3 02/25/2022 1044   ALKPHOS 82 02/25/2022 1044   AST 20 02/25/2022 1044   ALT 12 02/25/2022 1044   BILITOT 0.4 02/25/2022 1044       RADIOGRAPHIC  STUDIES: CT Chest Wo Contrast  Result Date: 03/01/2022 CLINICAL DATA:  Non-small cell lung cancer, assess treatment response, surgery and chemotherapy complete * Tracking Code: BO * EXAM: CT CHEST WITHOUT CONTRAST TECHNIQUE: Multidetector CT imaging of the chest was performed following the standard protocol without IV contrast. RADIATION DOSE REDUCTION: This exam was performed according to the departmental dose-optimization program which includes automated exposure control, adjustment of the mA and/or kV according to patient size and/or use of iterative reconstruction technique. COMPARISON:  12/24/2021, 08/24/2021 FINDINGS: Cardiovascular: Aortic atherosclerosis. 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 Normal heart size. Three-vessel coronary artery calcifications. No pericardial effusion. Mediastinum/Nodes: Numerous benign, densely calcified mediastinal and bilateral hilar lymph nodes. Small hiatal hernia. Thyroid gland, trachea, and esophagus demonstrate no significant findings. Lungs/Pleura: Status post right middle lobectomy. Severe emphysema. Biapical pleuroparenchymal scarring. Increasingly dense and confluent spiculated opacity of the dependent right lower lobe, now measuring in total approximately 2.0 x 1.5 cm (series 5, image 63). Stable, benign 0.4 cm nodule of  the anterior left upper lobe (series 5, image 64). No pleural effusion or pneumothorax. Upper Abdomen: No acute abnormality. Partially imaged abdominal aortic stent endograft (series 2, image 129). Musculoskeletal: No chest wall abnormality. No acute osseous findings. IMPRESSION: 1. Status post right middle lobectomy. 2. Increasingly dense and confluent spiculated opacity of the dependent right lower lobe, now measuring in total approximately 2.0 x 1.5 cm. This may reflect evolution of radiation fibrosis but behavior over time is generally somewhat worrisome  for local recurrence. This could be assessed for abnormal FDG avidity by PET-CT or at minimum close attention on follow-up. 3. Severe emphysema. 4. Calcified mediastinal and hilar lymph nodes, in keeping with prior granulomatous disease or perhaps alternately nodal sarcoidosis. 5. Coronary artery disease. Aortic Atherosclerosis (ICD10-I70.0) and Emphysema (ICD10-J43.9). Electronically Signed   By: Delanna Ahmadi M.D.   On: 03/01/2022 21:24   HYBRID OR IMAGING ([facility] ONLY)  Result Date: 02/03/2022 There is no interpretation for this exam.  This order is for images obtained during a surgical procedure.  Please See "Surgeries" Tab for more information regarding the procedure.     ASSESSMENT AND PLAN: This is a very pleasant 86 years old African-American male with likely recurrent lung cancer initially diagnosed as stage IIB (T3, N0, M0) invasive well-differentiated squamous cell carcinoma presented with right middle lobe lung mass status post right middle lobectomy with lymph node sampling on September 19, 2017 under the care of Dr. Roxan Hockey. The patient underwent adjuvant treatment with systemic chemotherapy with carboplatin for AUC of 6 and paclitaxel 200 mg/M2 every 3 weeks with Neulasta support status post 3 cycles. He tolerated this treatment well except for the chemotherapy-induced peripheral neuropathy and he discontinued his treatment after cycle #3. Repeat imaging studies several months ago including CT scan of the chest showed further increase in the previously identified subpleural nodule in the right lower lobe.  This is suspicious for metastatic or metachronous malignancy.  The patient had a PET scan that showed hypermetabolic activity and a new pulmonary nodule in the posterior right chest along the pleura suspicious for disease recurrence.  There was also new right thoracic inlet lymph node raising the question of metastatic involvement as well as numerous hypermetabolic lymph nodes in the  mediastinum and hila. 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 He underwent CT-guided core biopsy of the right lower lobe lung nodule and the final pathology was consistent with squamous cell carcinoma. He underwent SBRT to the right lower lobe lung nodule by Dr. Tammi Klippel and tolerated the procedure fairly well. The patient is currently on observation and he is feeling fine with no concerning complaints. He had repeat CT scan of the chest without contrast on February 25, 2022 and that showed no concerning findings for disease recurrence but there was increasingly dense and confluent spiculated opacity of the dependent right lower lobe highly suspicious for evaluation of radiation fibrosis but close monitoring is advised. I recommended for the patient to continue on observation with repeat CT scan of the chest in 3 months for restaging of his disease. The patient was advised to call immediately if he has any other concerning symptoms in the interval. The patient voices understanding of current disease status and treatment options and is in agreement with the current care plan.  All questions were answered. The patient knows to call the clinic with any problems, questions or concerns. We can certainly see the  patient much sooner if necessary.  Disclaimer: This note was dictated with voice recognition software. Similar sounding words can inadvertently be transcribed and may not be corrected upon review.

## 2022-03-09 ENCOUNTER — Ambulatory Visit (HOSPITAL_COMMUNITY)
Admission: RE | Admit: 2022-03-09 | Discharge: 2022-03-09 | Disposition: A | Discharge status: Home / Self Care | Payer: Medicare Other | Source: Ambulatory Visit | Attending: Surgery | Admitting: Surgery

## 2022-03-09 ENCOUNTER — Telehealth: Payer: Self-pay

## 2022-03-09 DIAGNOSIS — I7143 Infrarenal abdominal aortic aneurysm, without rupture: Secondary | ICD-10-CM | POA: Diagnosis present

## 2022-03-09 MED ORDER — IOHEXOL 350 MG/ML SOLN
75.0000 mL | Freq: Once | INTRAVENOUS | Status: AC | PRN
  Administered 2022-03-09: 75 mL via INTRAVENOUS

## 2022-03-09 NOTE — Telephone Encounter (Signed)
Pt's wife, Webb Silversmith, called stating that the pt is having a procedure on Saturday and needs clearance from Dr. Trula Slade.   Reviewed pt's chart, returned call for clarification, two identifiers used. She states that the pt is having an EGD done at [facility]. She states that the staff there has been calling this office since last week to get a clearance and have not gotten through. They are calling her at 1500 today. Instructed her to give them this office's fax number so it can be signed and faxed back for clearance. [place] fax number. Sent Dr. Trula Slade a staff msg. Confirmed understanding.  Received fax from [facility], received confirmation from Dr. Trula Slade to proceed with EGD.  Stamped and faxed procedural clearance to [place] pt's wife, Webb Silversmith, and relayed information. Confirmed understanding.

## 2022-03-15 ENCOUNTER — Ambulatory Visit (INDEPENDENT_AMBULATORY_CARE_PROVIDER_SITE_OTHER): Payer: Medicare Other | Admitting: Surgery

## 2022-03-15 ENCOUNTER — Encounter: Payer: Self-pay | Admitting: Surgery

## 2022-03-15 VITALS — BP 116/69 | HR 100 | Temp 97.9°F | Resp 20 | Ht 68.0 in | Wt 166.0 lb

## 2022-03-15 DIAGNOSIS — I7143 Infrarenal abdominal aortic aneurysm, without rupture: Secondary | ICD-10-CM

## 2022-03-15 NOTE — Progress Notes (Signed)
[facility] [place]  Patient name: Danny Duke MRN: [id number] DOB: [birth year] Sex: male   REASON FOR VISIT:    Follow up  HISOTRY OF PRESENT ILLNESS:    Danny Duke is a 87 y.o. male who is status post endovascular pair of a 5.2 cm infrarenal abdominal aortic aneurysm on 02/03/2022.  He has no complaints today  The patient has a history of lung cancer. He is medically managed for hypertension. He is a former smoker. He is on a statin for hypercholesterolemia.  PAST MEDICAL HISTORY:   Past Medical History:  Diagnosis Date   AAA (abdominal aortic aneurysm) ([place])    Chronic back pain    Chronic kidney disease    Hypercholesterolemia    Hypertension    Right Lung dx'd 09/2017   s/p RMLobectomy 2019 and adjuvant chemotherapy and SBRT     FAMILY HISTORY:   Family History  Problem Relation Age of Onset   Colon cancer Brother    Stomach cancer Brother     SOCIAL HISTORY:   Social History   Tobacco Use   Smoking status: Former    Packs/day: 0.50    Years: 40.00    Total pack years: 20.00    Types: Cigarettes    Quit date: 03/08/1997    Years since quitting: 25.0   Smokeless tobacco: Never  Substance Use Topics   Alcohol use: No     ALLERGIES:   No Known Allergies   CURRENT MEDICATIONS:   Current Outpatient Medications  Medication Sig Dispense Refill   acetaminophen (TYLENOL) 500 MG tablet Take 1,000 mg by mouth every 6 (six) hours as needed for moderate pain or headache.     allopurinol (ZYLOPRIM) 100 MG tablet Take 100 mg by mouth daily.     amLODipine (NORVASC) 10 MG tablet Take 10 mg by mouth daily.     aspirin EC 81 MG tablet Take 81 mg by mouth daily.     DULoxetine (CYMBALTA) 60 MG capsule TAKE 1 CAPSULE(60 MG) BY MOUTH DAILY 30 capsule 3   FARXIGA 10 MG TABS tablet Take 10 mg by mouth daily.     gabapentin (NEURONTIN) 300 MG capsule TAKE 3 CAPSULES(900 MG) BY MOUTH THREE TIMES DAILY  120 capsule 6   metoprolol succinate (TOPROL-XL) 25 MG 24 hr tablet Take 1 tablet (25 mg total) by mouth daily. 60 tablet 1   Oxycodone HCl 20 MG TABS Take 20 mg by mouth every 6 (six) hours as needed (pain).   0   simvastatin (ZOCOR) 20 MG tablet Take 20 mg by mouth daily.     Vitamin D, Ergocalciferol, (DRISDOL) 1.25 MG (50000 UNIT) CAPS capsule Take 50,000 Units by mouth once a week.     No current facility-administered medications for this visit.    REVIEW OF SYSTEMS:   [X]  denotes positive finding, [ ]  denotes negative finding Cardiac  Comments:  Chest pain or chest pressure:    Shortness of breath upon exertion:    Short of breath when lying flat:    Irregular heart rhythm:        Vascular    Pain in calf, thigh, or hip brought on by ambulation:    Pain in feet at night that wakes you up from your sleep:     Blood clot in your veins:    Leg swelling:         Pulmonary    Oxygen at home:    Productive cough:  Wheezing:         Neurologic    Sudden weakness in arms or legs:     Sudden numbness in arms or legs:     Sudden onset of difficulty speaking or slurred speech:    Temporary loss of vision in one eye:     Problems with dizziness:         Gastrointestinal    Blood in stool:     Vomited blood:         Genitourinary    Burning when urinating:     Blood in urine:        Psychiatric    Major depression:         Hematologic    Bleeding problems:    Problems with blood clotting too easily:        Skin    Rashes or ulcers:        Constitutional    Fever or chills:      PHYSICAL EXAM:   There were no vitals filed for this visit.  GENERAL: The patient is a well-nourished male, in no acute distress. The vital signs are documented above. CARDIAC: There is a regular rate and rhythm.  PULMONARY: Non-labored respirations ABDOMEN: Soft and non-tender  MUSCULOSKELETAL: There are no major deformities or cyanosis. NEUROLOGIC: No focal weakness or  paresthesias are detected. SKIN: There are no ulcers or rashes noted. PSYCHIATRIC: The patient has a normal affect.  STUDIES:   I reviewed the following CT scan: VASCULAR   1. Interval endovascular aortic repair of infrarenal abdominal aortic aneurysm. No evidence of endoleak. The excluded aneurysm sac is stable at 5.1 cm. 2. 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 The aneurysm is partially covered by the left iliac limb of the stent graft. 3. Stable ectasia of the right common iliac artery to 1.8 cm. 4. Scattered atherosclerotic plaque.   NON-VASCULAR   1. No acute abnormality within the abdomen or pelvis. 2. Ancillary findings as above without significant interval change.   Aortic aneurysm NOS (ICD10-I71.9); Aortic Atherosclerosis (ICD10-I70.0) and Emphysema (ICD10-J43.9).  MEDICAL ISSUES:   Status post endovascular repair of a 5.2 cm infrarenal abdominal aortic aneurysm.  Initial CT scan shows good position of the stent graft with no evidence of endoleak.  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  I intentionally did not embolize his hypogastric and stent across it at the time of his operation.  I discussed this with the patient and his wife.  We will continue to monitor this.    Leia Alf, MD, FACS [facility] [phone] Pager [phone]

## 2022-03-16 ENCOUNTER — Other Ambulatory Visit: Payer: Self-pay

## 2022-03-16 DIAGNOSIS — I7143 Infrarenal abdominal aortic aneurysm, without rupture: Secondary | ICD-10-CM

## 2022-03-16 NOTE — Progress Notes (Signed)
Fax received for medical clearance/medication hold for Dr. Trula Slade.  Provider signed, form faxed back to sender, verified successful, sent to scan center.

## 2022-05-17 IMAGING — CT CT CHEST W/ CM
2 of 4 series · 14 of 36 positions shown, 17 images · IV contrast (OMNIPAQUE)
Comparison: 06/19/2019, 12/19/2018

CLINICAL DATA: Non-small cell lung cancer staging

EXAM:
CT CHEST WITH CONTRAST
TECHNIQUE: Multidetector CT imaging of the chest was performed during
intravenous contrast administration.
CONTRAST:  75mL OMNIPAQUE IOHEXOL 300 MG/ML  SOLN

[Series 2: axial st · axial · 0.85mm/px · z∈[-316,-52]mm · 11 of 158 slices shown, 14 images]
[im 13/158  mediastinal]
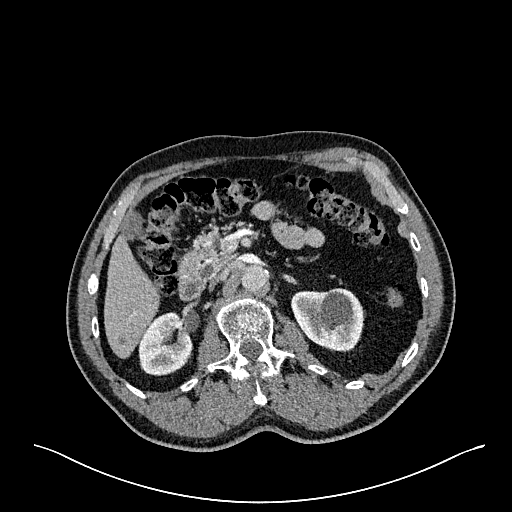
[im 13/158  lung]
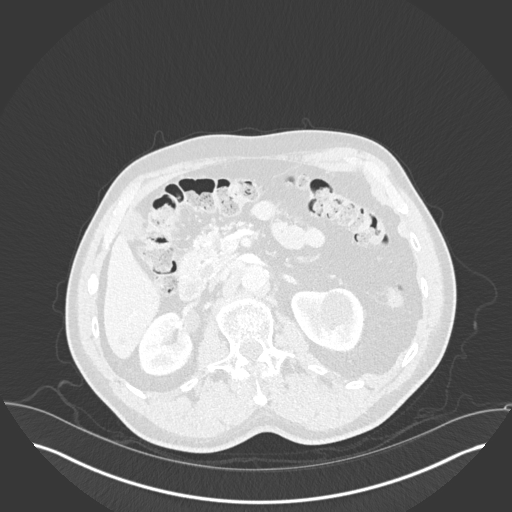
[im 25/158  lung]
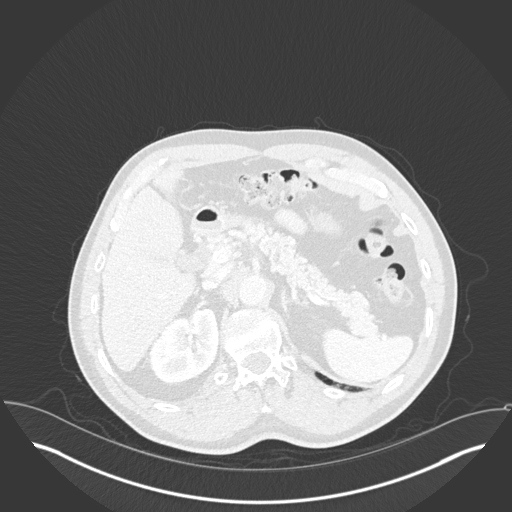
[im 37/158  lung]
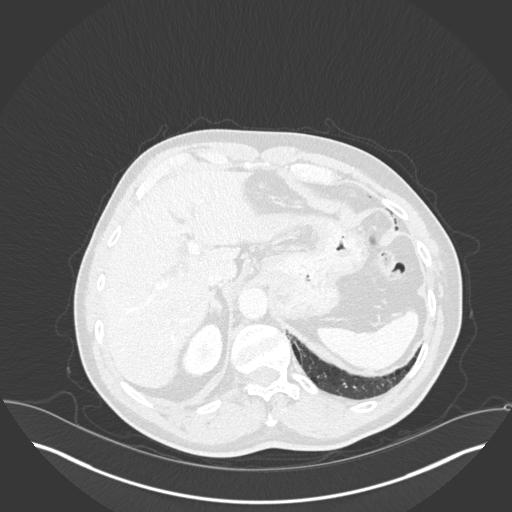
[im 49/158  lung]
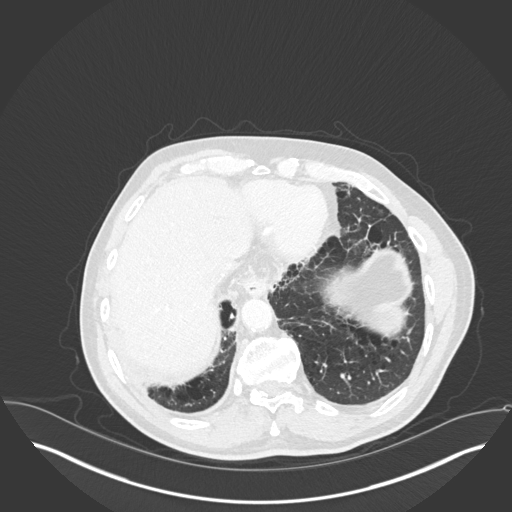
[im 61/158  mediastinal]
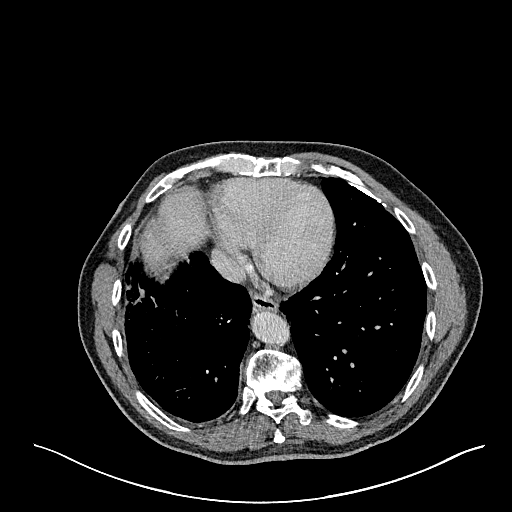
[im 61/158  lung]
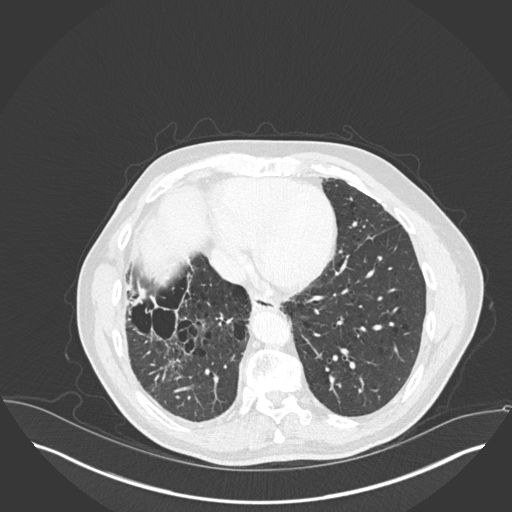
[im 85/158  lung]
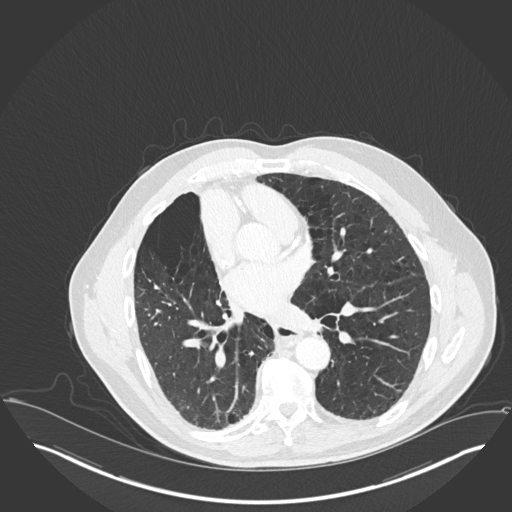
[im 97/158  lung]
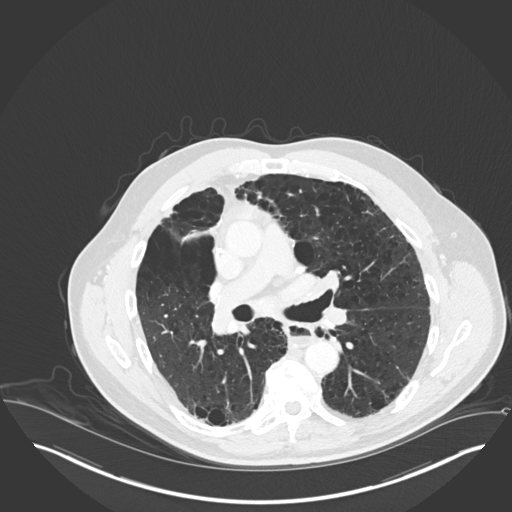
[im 109/158  lung]
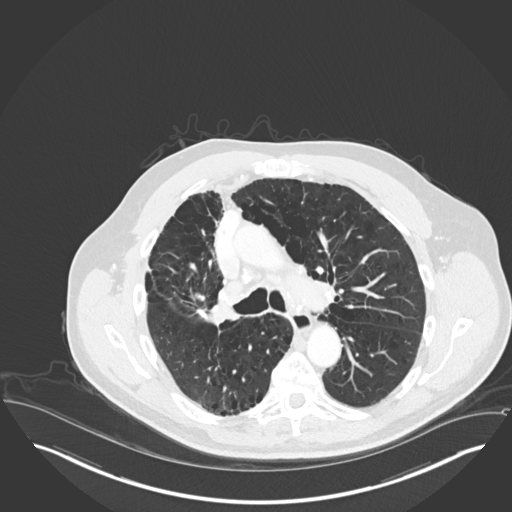
[im 121/158  mediastinal]
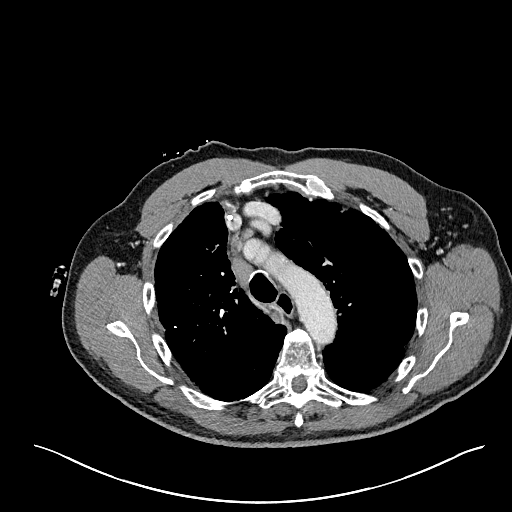
[im 121/158  lung]
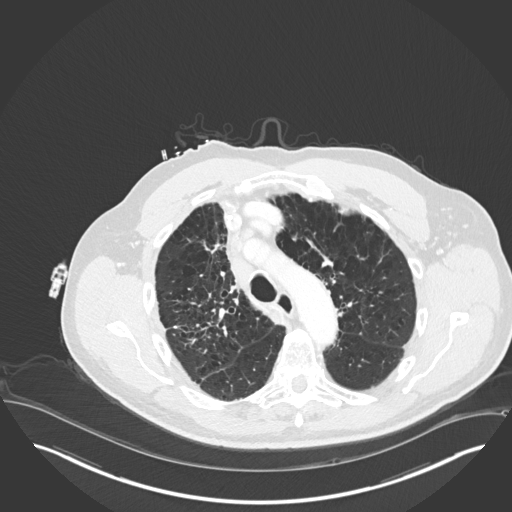
[im 133/158  lung]
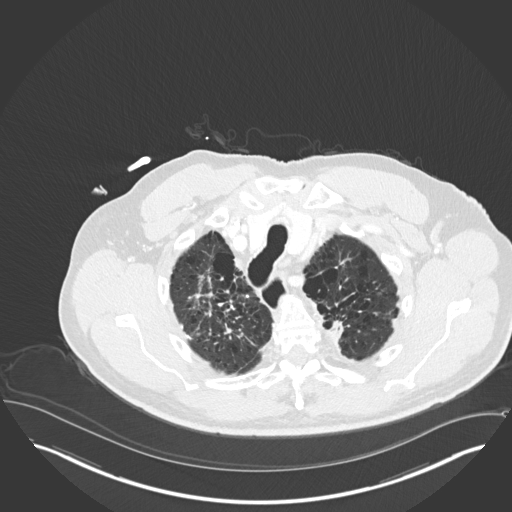
[im 145/158  lung]
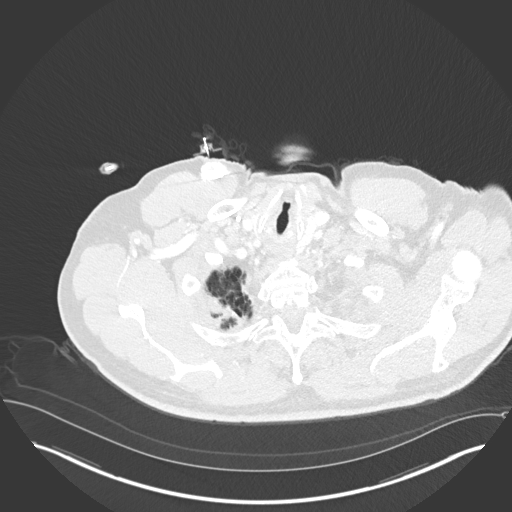

[Series 5: coronal · coronal · 0.68mm/px · 3 of 138 slices shown]
[im 28/138  lung]
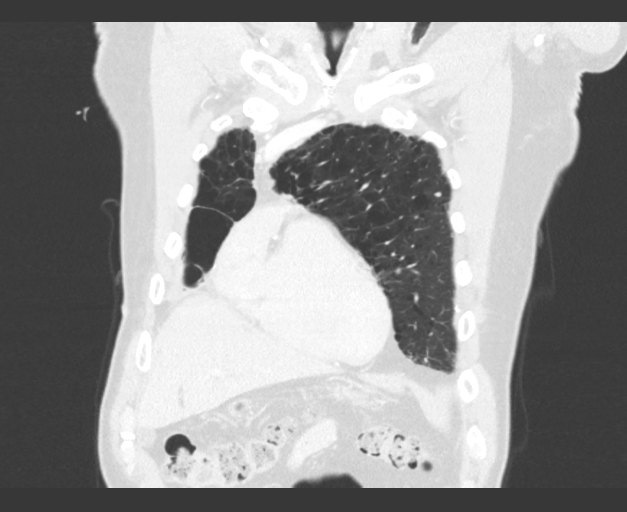
[im 55/138  lung]
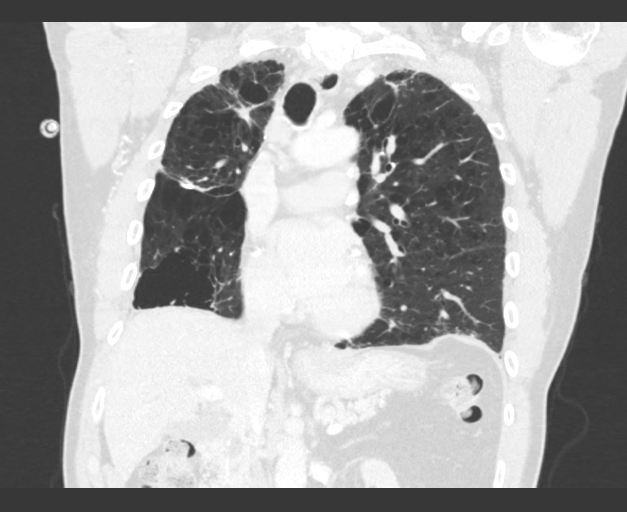
[im 83/138  lung]
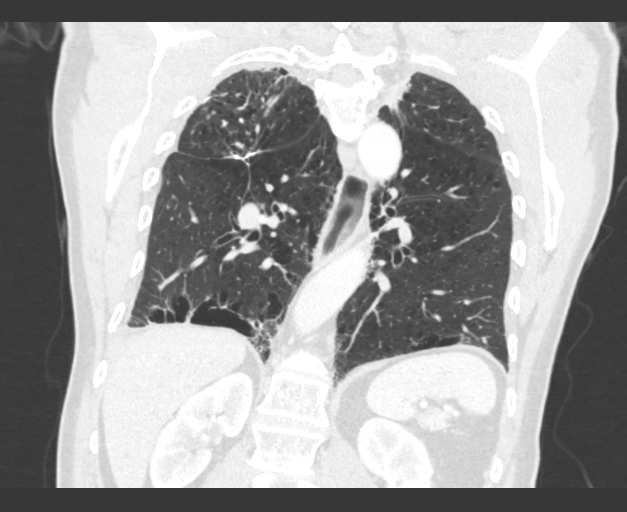

[14 of 36 positions shown; findings below may reference images not displayed]

FINDINGS: Cardiovascular: Aortic atherosclerosis. Normal heart size.
Three-vessel coronary artery calcifications. No pericardial
effusion.

Mediastinum/Nodes: Numerous calcified, enlarged mediastinal and
hilar lymph nodes. There are additional noncalcified pretracheal
lymph nodes, unchanged compared to prior examination, measuring up
to 2.6 x 1.7 cm (series 2, image 38). Thyroid gland, trachea, and
esophagus demonstrate no significant findings.

Lungs/Pleura: Redemonstrated postoperative findings of right middle
lobectomy and right upper lobe wedge resection. Severe centrilobular
emphysema with large bullae or pneumatocele at the right greater
than left lung bases. Unchanged biapical pleuroparenchymal scarring.
Continued interval enlargement of a subpleural nodule of the
dependent right lower lobe, measuring 1.3 x 0.8 cm, previously 0.8 x
0.6 cm when measured similarly (series 7, image 51). Stable 4 mm
nodule of the anterior left upper lobe (series 7, image 55). No
pleural effusion or pneumothorax.

Upper Abdomen: No acute abnormality. Mild intra and extrahepatic
biliary ductal dilatation, unchanged compared to prior. Hepatic
steatosis. Numerous subcentimeter low-attenuation lesions of the
liver, unchanged and incompletely characterized although likely
subcentimeter cysts.

Musculoskeletal: No chest wall mass or suspicious bone lesions
identified.
IMPRESSION: 1. Continued interval enlargement of a subpleural nodule of the
dependent right lower lobe, measuring 1.3 x 0.8 cm, previously 0.8 x
0.6 cm when measured similarly. This is concerning for metastatic or
metachronous malignancy.
2. Stable 4 mm nodule of the anterior left upper lobe. Attention on
follow-up.
3. Redemonstrated postoperative findings of right middle lobectomy
and right upper lobe wedge resection.
4. Numerous calcified, enlarged mediastinal and hilar lymph nodes,
with additional noncalcified pretracheal lymph nodes, unchanged
compared to prior examination, measuring up to 2.6 x 1.7 cm.
Attention on follow-up.
5. Emphysema ([id number].X).
6. Coronary artery disease.  Aortic Atherosclerosis ([id number].M).

## 2022-05-22 ENCOUNTER — Encounter (HOSPITAL_COMMUNITY): Payer: Self-pay

## 2022-05-22 ENCOUNTER — Emergency Department (HOSPITAL_COMMUNITY): Payer: Medicare Other

## 2022-05-22 ENCOUNTER — Emergency Department (HOSPITAL_COMMUNITY)
Admission: EM | Admit: 2022-05-22 | Discharge: 2022-05-22 | Disposition: A | Discharge status: Home / Self Care | Payer: Medicare Other | Attending: Emergency Medicine | Admitting: Emergency Medicine

## 2022-05-22 ENCOUNTER — Other Ambulatory Visit: Payer: Self-pay

## 2022-05-22 DIAGNOSIS — J449 Chronic obstructive pulmonary disease, unspecified: Secondary | ICD-10-CM | POA: Insufficient documentation

## 2022-05-22 DIAGNOSIS — D649 Anemia, unspecified: Secondary | ICD-10-CM | POA: Insufficient documentation

## 2022-05-22 DIAGNOSIS — R1084 Generalized abdominal pain: Secondary | ICD-10-CM

## 2022-05-22 DIAGNOSIS — Z85118 Personal history of other malignant neoplasm of bronchus and lung: Secondary | ICD-10-CM | POA: Insufficient documentation

## 2022-05-22 DIAGNOSIS — R7989 Other specified abnormal findings of blood chemistry: Secondary | ICD-10-CM

## 2022-05-22 DIAGNOSIS — Z79899 Other long term (current) drug therapy: Secondary | ICD-10-CM | POA: Insufficient documentation

## 2022-05-22 DIAGNOSIS — R0602 Shortness of breath: Secondary | ICD-10-CM | POA: Diagnosis not present

## 2022-05-22 DIAGNOSIS — Z1152 Encounter for screening for COVID-19: Secondary | ICD-10-CM | POA: Diagnosis not present

## 2022-05-22 DIAGNOSIS — D72829 Elevated white blood cell count, unspecified: Secondary | ICD-10-CM | POA: Insufficient documentation

## 2022-05-22 DIAGNOSIS — K529 Noninfective gastroenteritis and colitis, unspecified: Secondary | ICD-10-CM | POA: Diagnosis not present

## 2022-05-22 DIAGNOSIS — I10 Essential (primary) hypertension: Secondary | ICD-10-CM | POA: Insufficient documentation

## 2022-05-22 DIAGNOSIS — R109 Unspecified abdominal pain: Secondary | ICD-10-CM | POA: Diagnosis present

## 2022-05-22 LAB — COMPREHENSIVE METABOLIC PANEL
ALT: 16 U/L (ref 0–44)
AST: 23 U/L (ref 15–41)
Albumin: 3.4 g/dL — ABNORMAL LOW (ref 3.5–5.0)
Alkaline Phosphatase: 75 U/L (ref 38–126)
Anion gap: 7 (ref 5–15)
BUN: 9 mg/dL (ref 8–23)
CO2: 25 mmol/L (ref 22–32)
Calcium: 8.9 mg/dL (ref 8.9–10.3)
Chloride: 101 mmol/L (ref 98–111)
Creatinine, Ser: 1.48 mg/dL — ABNORMAL HIGH (ref 0.61–1.24)
GFR, Estimated: 46 mL/min — ABNORMAL LOW (ref >60)
Glucose, Bld: 105 mg/dL — ABNORMAL HIGH (ref 70–99)
Potassium: 3.6 mmol/L (ref 3.5–5.1)
Sodium: 133 mmol/L — ABNORMAL LOW (ref 135–145)
Total Bilirubin: 0.7 mg/dL (ref 0.3–1.2)
Total Protein: 7.3 g/dL (ref 6.5–8.1)

## 2022-05-22 LAB — URINALYSIS, ROUTINE W REFLEX MICROSCOPIC
Bacteria, UA: NONE SEEN
Bilirubin Urine: NEGATIVE
Glucose, UA: 150 mg/dL — AB
Ketones, ur: NEGATIVE mg/dL
Leukocytes,Ua: NEGATIVE
Nitrite: NEGATIVE
Protein, ur: 30 mg/dL — AB
Specific Gravity, Urine: 1.017 (ref 1.005–1.030)
pH: 5 (ref 5.0–8.0)

## 2022-05-22 LAB — CBC
HCT: 38.6 % — ABNORMAL LOW (ref 39.0–52.0)
Hemoglobin: 12.8 g/dL — ABNORMAL LOW (ref 13.0–17.0)
MCH: 30.8 pg (ref 26.0–34.0)
MCHC: 33.2 g/dL (ref 30.0–36.0)
MCV: 93 fL (ref 80.0–100.0)
Platelets: 144 10*3/uL — ABNORMAL LOW (ref 150–400)
RBC: 4.15 MIL/uL — ABNORMAL LOW (ref 4.22–5.81)
RDW: 13.9 % (ref 11.5–15.5)
WBC: 13.6 10*3/uL — ABNORMAL HIGH (ref 4.0–10.5)
nRBC: 0 % (ref 0.0–0.2)

## 2022-05-22 LAB — RESP PANEL BY RT-PCR (RSV, FLU A&B, COVID)  RVPGX2
Influenza A by PCR: NEGATIVE
Influenza B by PCR: NEGATIVE
Resp Syncytial Virus by PCR: NEGATIVE
SARS Coronavirus 2 by RT PCR: NEGATIVE

## 2022-05-22 LAB — LIPASE, BLOOD: Lipase: 31 U/L (ref 11–51)

## 2022-05-22 LAB — BRAIN NATRIURETIC PEPTIDE: B Natriuretic Peptide: 47.6 pg/mL (ref 0.0–100.0)

## 2022-05-22 LAB — TROPONIN I (HIGH SENSITIVITY)
Troponin I (High Sensitivity): 89 ng/L — ABNORMAL HIGH (ref <18)
Troponin I (High Sensitivity): 71 ng/L — ABNORMAL HIGH (ref <18)

## 2022-05-22 MED ORDER — POLYETHYLENE GLYCOL 3350 17 GM/SCOOP PO POWD: 1.0000 | Freq: Once | ORAL | 0 refills | Status: AC

## 2022-05-22 MED ORDER — IOHEXOL 350 MG/ML SOLN
75.0000 mL | Freq: Once | INTRAVENOUS | Status: AC | PRN
  Administered 2022-05-22: 75 mL via INTRAVENOUS

## 2022-05-22 MED ORDER — LACTATED RINGERS IV BOLUS
1000.0000 mL | Freq: Once | INTRAVENOUS | Status: AC
  Administered 2022-05-22: 1000 mL via INTRAVENOUS

## 2022-05-22 MED ORDER — AMOXICILLIN-POT CLAVULANATE 875-125 MG PO TABS: 1.0000 | ORAL_TABLET | Freq: Two times a day (BID) | ORAL | 0 refills | Status: DC

## 2022-05-22 MED ORDER — FENTANYL CITRATE PF 50 MCG/ML IJ SOSY
50.0000 ug | PREFILLED_SYRINGE | Freq: Once | INTRAMUSCULAR | Status: AC
  Administered 2022-05-22: 50 ug via INTRAVENOUS
  Filled 2022-05-22: qty 1

## 2022-05-22 MED ORDER — ONDANSETRON HCL 4 MG/2ML IJ SOLN
4.0000 mg | Freq: Three times a day (TID) | INTRAMUSCULAR | Status: DC | PRN
  Administered 2022-05-22: 4 mg via INTRAVENOUS
  Filled 2022-05-22: qty 2

## 2022-05-22 NOTE — ED Triage Notes (Signed)
Pt reports intermittent SOB for 2 weeks, lower abd pain for 2 days and balance issues since last night. Pt denies n/v/d.

## 2022-05-22 NOTE — ED Provider Notes (Signed)
[facility] Provider Note   CSN: [id number] Arrival date & time: 05/22/22  1404     History  Chief Complaint  Patient presents with   Abdominal Pain   Shortness of Breath   Gait Problem    Danny Duke is a 87 y.o. male.   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  The patient has had intermittent shortness of breath for the past 2 weeks.  Over the last 2 days he developed abdominal pain.  He has not had a bowel movement in several days and states he is not passing gas.  He endorses a crampy abdominal discomfort that radiates to his back.  No ripping tearing component.  Additionally, the patient endorses shortness of breath that has been intermittent for the past 2 weeks but worsened over the past few days.  He endorses mild muscle aches, denies any fever, endorses mild cough.  Denies any chest pain.  He denies any nausea, vomiting, diarrhea.  Home Medications Prior to Admission medications   Medication Sig Start Date End Date Taking? Authorizing Provider  amoxicillin-clavulanate (AUGMENTIN) 875-125 MG tablet Take 1 tablet by mouth every 12 (twelve) hours. 05/22/22  Yes Regan Lemming, MD  acetaminophen (TYLENOL) 500 MG tablet Take 1,000 mg by mouth every 6 (six) hours as needed for moderate pain or headache.    [provider]  allopurinol (ZYLOPRIM) 100 MG tablet Take 100 mg by mouth daily. 10/23/21   [provider]  amLODipine (NORVASC) 10 MG tablet Take 10 mg by mouth daily.    [provider]  aspirin EC 81 MG tablet Take 81 mg by mouth daily.    [provider]   DULoxetine (CYMBALTA) 60 MG capsule TAKE 1 CAPSULE(60 MG) BY MOUTH DAILY 01/11/22   Vaslow, Acey Lav, MD  FARXIGA 10 MG TABS tablet Take 10 mg by mouth daily. 01/19/22   [provider]  gabapentin (NEURONTIN) 300 MG capsule TAKE 3 CAPSULES(900 MG) BY MOUTH THREE TIMES DAILY 10/06/20   Vaslow, Acey Lav, MD  metoprolol succinate (TOPROL-XL) 25 MG 24 hr tablet Take 1 tablet (25 mg total) by mouth daily. 09/26/17   Gold, Wilder Glade, PA-C  Oxycodone HCl 20 MG TABS Take 20 mg by mouth every 6 (six) hours as needed (pain).  10/31/17   [provider]  simvastatin (ZOCOR) 20 MG tablet Take 20 mg by mouth daily.    [provider]  Vitamin D, Ergocalciferol, (DRISDOL) 1.25 MG (50000 UNIT) CAPS capsule Take 50,000 Units by mouth once a week. 12/18/21   [provider]      Allergies    Patient has no known allergies.    Review of Systems   Review of Systems  Respiratory:  Positive for shortness of breath.   Gastrointestinal:  Positive for abdominal pain.  All other systems reviewed and are negative.   Physical Exam Updated Vital Signs BP 129/84   Pulse 85   Temp 98.8 F (37.1 C) (Oral)   Resp 16   Ht 5\' 8"  (1.727 m)   Wt 75.3 kg   SpO2 98%   BMI 25.24 kg/m  Physical Exam Vitals and nursing note reviewed.  Constitutional:      General: He is not in acute distress.    Appearance: He is well-developed.  HENT:     Head: Normocephalic and atraumatic.  Eyes:     Conjunctiva/sclera: Conjunctivae normal.  Cardiovascular:     Rate and Rhythm: Normal rate and regular rhythm.     Heart sounds: No murmur heard. Pulmonary:     Effort: Pulmonary effort is normal. No respiratory distress.     Breath sounds: Normal breath sounds.  Abdominal:     Palpations: Abdomen is soft.     Tenderness: There is generalized abdominal tenderness. There is no guarding or rebound.  Musculoskeletal:        General: No swelling.     Cervical back: Neck supple.  Skin:     General: Skin is warm and dry.     Capillary Refill: Capillary refill takes less than 2 seconds.  Neurological:     Mental Status: He is alert.  Psychiatric:        Mood and Affect: Mood normal.     ED Results / Procedures / Treatments   Labs (all labs ordered are listed, but only abnormal results are displayed) Labs Reviewed  COMPREHENSIVE METABOLIC PANEL - Abnormal; Notable for the following components:      Result Value   Sodium 133 (*)    Glucose, Bld 105 (*)    Creatinine, Ser 1.48 (*)    Albumin 3.4 (*)    GFR, Estimated 46 (*)    All other components within normal limits  CBC - Abnormal; Notable for the following components:   WBC 13.6 (*)    RBC 4.15 (*)    Hemoglobin 12.8 (*)    HCT 38.6 (*)    Platelets 144 (*)    All other components within normal limits  URINALYSIS, ROUTINE W REFLEX MICROSCOPIC - Abnormal; Notable for the following components:   Glucose, UA 150 (*)    Hgb urine dipstick SMALL (*)    Protein, ur 30 (*)    All other components within normal limits  TROPONIN I (HIGH SENSITIVITY) - Abnormal; Notable for the following components:   Troponin I (High Sensitivity) 89 (*)    All other components within normal limits  TROPONIN I (HIGH SENSITIVITY) - Abnormal; Notable for the following components:   Troponin I (High Sensitivity) 71 (*)    All other components within normal limits  RESP PANEL BY RT-PCR (RSV, FLU A&B, COVID)  RVPGX2  LIPASE, BLOOD  BRAIN NATRIURETIC PEPTIDE    EKG EKG Interpretation  Date/Time:  Saturday May 22 2022 15:27:03 EDT Ventricular Rate:  78 PR Interval:  150 QRS Duration: 82 QT Interval:  371 QTC Calculation: 423 R Axis:   30 Text Interpretation: Sinus rhythm Nonspecific T abnormalities, lateral leads No significant change since last tracing Confirmed by Regan Lemming ([id number]) on 05/22/2022 3:31:08 PM  Radiology CT Angio Chest/Abd/Pel for Dissection W and/or Wo Contrast  Result Date: 05/22/2022 CLINICAL DATA:   Thoracoabdominal aortic aneurysm. EXAM: CT ANGIOGRAPHY CHEST, ABDOMEN AND PELVIS TECHNIQUE: Non-contrast CT of the chest was initially obtained. Multidetector CT imaging through the chest, abdomen and pelvis was performed using the standard protocol during bolus administration of intravenous contrast. Multiplanar reconstructed images and MIPs were obtained and reviewed to evaluate the vascular anatomy. RADIATION DOSE REDUCTION: This exam was performed according to the departmental dose-optimization program which includes automated exposure control, adjustment of the mA and/or kV according to patient size and/or use of iterative reconstruction technique.  CONTRAST:  90mL OMNIPAQUE IOHEXOL 350 MG/ML SOLN COMPARISON:  03/09/2022 FINDINGS: CTA CHEST FINDINGS Cardiovascular: No thoracic aortic aneurysm. The heart size is normal. No substantial pericardial effusion. Coronary artery calcification is evident. Mediastinum/Nodes: Calcified nodal tissue is seen in the mediastinum in both hilar regions. Patulous esophagus is diffusely fluid-filled. There is no axillary lymphadenopathy. Lungs/Pleura: Centrilobular and paraseptal emphysema evident. Biapical pleuroparenchymal scarring is similar to chest CT 02/25/2022 with multiple irregular nodular densities scattered in the right upper lobe. Peripheral irregular subpleural consolidative opacity in the posterior right lower lobe is stable measuring 3.9 x 1.9 cm today which compares to 3.8 by 1.9 cm when remeasured in a similar fashion on the previous study. Surgical changes noted anterior right lung, stable. Calcified pleural plaque noted on the left. No pleural effusion. Musculoskeletal: Bones are demineralized. No worrisome lytic or sclerotic osseous abnormality. Review of the MIP images confirms the above findings. CTA ABDOMEN AND PELVIS FINDINGS VASCULAR Aorta: Infrarenal abdominal aortic aneurysm again noted status post aorto bi-iliac stent graft placement. Native aneurysm  sac measures 4.1 x 4.0 cm today compared to maximum diameter 5.1 cm previously. No evidence for endoleak. Celiac: Patent without evidence of aneurysm, dissection, vasculitis or significant stenosis. SMA: Patent without evidence of aneurysm, dissection, vasculitis or significant stenosis. Renals: Both main renal arteries are patent without evidence of aneurysm, dissection, vasculitis, fibromuscular dysplasia or significant stenosis. IMA: Chronic occlusion. 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 Atherosclerotic plaque in the left common iliac artery is stable compared to prior. No substantial change right common iliac artery ectasia. Veins: No obvious venous abnormality within the limitations of this arterial phase study. Review of the MIP images confirms the above findings. NON-VASCULAR Hepatobiliary: No suspicious focal abnormality within the liver parenchyma. There is no evidence for gallstones, gallbladder wall thickening, or pericholecystic fluid. No intrahepatic or extrahepatic biliary dilation. Pancreas: No focal mass lesion. No dilatation of the main duct. No intraparenchymal cyst. No peripancreatic edema. Spleen: No splenomegaly. No focal mass lesion. Adrenals/Urinary Tract: No adrenal nodule or mass. Bilateral renal cysts again noted. No followup imaging is recommended. No evidence for hydroureter. The urinary bladder appears normal for the degree of distention. Stomach/Bowel: Stomach is unremarkable. No gastric wall thickening. No evidence of outlet obstruction. Duodenum is normally positioned as is the ligament of Treitz. No small bowel wall thickening. No small bowel dilatation. Circumferential wall thickening noted in the cecum and ascending colon with subtle pericolonic edema/inflammation. This area of the colon was relatively normal in appearance on the study from 2 months ago. Transverse and left colon unremarkable. Lymphatic: There is no gastrohepatic or  hepatoduodenal ligament lymphadenopathy. No retroperitoneal or mesenteric lymphadenopathy. No pelvic sidewall lymphadenopathy. Reproductive: The prostate gland and seminal vesicles are unremarkable. Other: Trace free fluid is seen in the pelvis. Musculoskeletal: No worrisome lytic or sclerotic osseous abnormality. Review of the MIP images confirms the above findings. IMPRESSION: 1. Circumferential wall thickening in the cecum and ascending colon with subtle pericolonic edema/inflammation. This area of the colon was relatively normal in appearance on the study from 2 months ago. Imaging features are compatible with infectious/inflammatory colitis. Given the relatively normal appearance 2 months ago, colorectal neoplasm/lymphoma is considered less likely 2. Infrarenal abdominal aortic aneurysm status post aorto bi-iliac stent graft placement. Native aneurysm sac measures 4.1 x 4.0 cm today compared to maximum diameter 5.1 cm previously. No evidence for endoleak. 3. Trace free fluid in the pelvis. 4. Biapical pleuroparenchymal scarring is similar to chest  CT 02/25/2022 with prominent masslike irregular consolidative opacity in the peripheral right lower lobe. Neoplasm not excluded. Close follow-up recommended and PET-CT may be warranted to further evaluate. 5. Emphysema (ICD10-J43.9) and Aortic Atherosclerosis (ICD10-170.0) Electronically Signed   By: Misty Stanley M.D.   On: 05/22/2022 17:52    Procedures Procedures    Medications Ordered in ED Medications  fentaNYL (SUBLIMAZE) injection 50 mcg (50 mcg Intravenous Given 05/22/22 1601)  lactated ringers bolus 1,000 mL (0 mLs Intravenous Stopped 05/22/22 1923)  iohexol (OMNIPAQUE) 350 MG/ML injection 75 mL (75 mLs Intravenous Contrast Given 05/22/22 1625)  fentaNYL (SUBLIMAZE) injection 50 mcg (50 mcg Intravenous Given 05/22/22 1857)    ED Course/ Medical Decision Making/ A&P Clinical Course as of 05/24/22 0023  Sat May 22, 2022  1657 Troponin I (High  Sensitivity)(!): 89 [JL]  1847 WBC(!): 13.6 [JL]    Clinical Course User Index [JL] Regan Lemming, MD                             Medical Decision Making Amount and/or Complexity of Data Reviewed Labs: ordered. Decision-making details documented in ED Course. Radiology: ordered.  Risk Prescription drug management.     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  The patient has had intermittent shortness of breath for the past 2 weeks.  Over the last 2 days he developed abdominal pain.  He has not had a bowel movement in several days and states he is not passing gas.  He endorses a crampy abdominal discomfort that radiates to his back.  No ripping tearing component.  Additionally, the patient endorses shortness of breath that has been intermittent for the past 2 weeks but worsened over the past few days.  He endorses mild muscle aches, denies any fever, endorses mild cough.  Denies any chest pain.  He denies any nausea, vomiting, diarrhea.  On arrival, the patient was afebrile, not tachycardic or tachypneic, BP 123/75, saturating 100% on room air.  Sinus rhythm noted on cardiac telemetry.  Physical exam significant for an abdomen that had generalized tenderness to palpation, no focality, no rebound or guarding.  Additionally, the patient had coarse rhonchi noted.  Differential diagnosis includes small bowel obstruction, ruptured AAA, pneumonia, pneumothorax, CHF, COVID-19, influenza, viral syndrome, appendicitis, cholelithiasis/cholecystitis, nephrolithiasis, pyelonephritis, diverticulitis.  Labs: Urinalysis with small hematuria, no evidence of UTI, CBC with a leukocytosis of 13.6, mild anemia to 12.8, lipase normal, CMP with mildly elevated but at baseline serum creatinine at 1.48, no  elevation in LFTs, normal T. bili, troponin, BNP, COVID-19 and influenza PCR testing collected and pending.  IV access was obtained and the patient was administered IV fluid bolus, IV fentanyl, IV Zofran was ordered for nausea control as needed.  CTA Chest Abd Pelvis: IMPRESSION:  1. Circumferential wall thickening in the cecum and ascending colon  with subtle pericolonic edema/inflammation. This area of the colon  was relatively normal in appearance on the study from 2 months ago.  Imaging features are compatible with infectious/inflammatory  colitis. Given the relatively normal appearance 2 months ago,  colorectal neoplasm/lymphoma is considered less likely  2. Infrarenal abdominal aortic aneurysm status post aorto bi-iliac  stent graft placement. Native aneurysm sac measures 4.1 x 4.0 cm  today compared to maximum diameter 5.1 cm previously. No  evidence  for endoleak.  3. Trace free fluid in the pelvis.  4. Biapical pleuroparenchymal scarring is similar to chest CT  02/25/2022 with prominent masslike irregular consolidative opacity  in the peripheral right lower lobe. Neoplasm not excluded. Close  follow-up recommended and PET-CT may be warranted to further  evaluate.  5. Emphysema (ICD10-J43.9) and Aortic Atherosclerosis (ICD10-170.0)    No evidence of bowel obstruction noted on exam.  Patient abdominal discomfort could be explained by colitis seen on exam.  19, influenza, RSV PCR testing resulted negative, BNP was normal.  Initial troponin was elevated at 89, repeat downtrending at 71.  The patient continues to be chest pain-free in the emergency department.  Low concern for ACS.  Low concern for PE with no evidence of PE on CT angiogram although contrast bolus timing was optimized for the aorta.  No evidence of aortic dissection or significant endoleak.  Following IV pain medication and fluids, the patient was feeling symptomatically improved.  He tolerated oral intake and passed a p.o.  challenge in the emergency department.  He is feeling symptomatically improved and requesting discharge.  Given his concern for constipation, recommended a bowel regimen with MiraLAX powder.  Additionally given the patient's colitis finding on CT and abdominal discomfort, will prescribe antibiotics, encouraged continued oral fluid rehydration, outpatient follow-up with his PCP.  Informed the patient of his CT findings which could be due to colorectal neoplasm and lymphoma versus a colitis.  Plan to follow-up outpatient with his PCP regarding his results.  Stable for discharge at this time, return precautions provided.   Final Clinical Impression(s) / ED Diagnoses Final diagnoses:  Generalized abdominal pain  Colitis  Elevated troponin    Rx / DC Orders ED Discharge Orders          Ordered    polyethylene glycol powder (GLYCOLAX/MIRALAX) 17 GM/SCOOP powder   Once        05/22/22 2042    amoxicillin-clavulanate (AUGMENTIN) 875-125 MG tablet  Every 12 hours        05/22/22 2042              Regan Lemming, MD 05/24/22 0023

## 2022-05-22 NOTE — Discharge Instructions (Addendum)
Your CT scan revealed the following, recommend you follow-up with your PCP regarding these results: IMPRESSION:  1. Circumferential wall thickening in the cecum and ascending colon  with subtle pericolonic edema/inflammation. This area of the colon  was relatively normal in appearance on the study from 2 months ago.  Imaging features are compatible with infectious/inflammatory  colitis. Given the relatively normal appearance 2 months ago,  colorectal neoplasm/lymphoma is considered less likely  2. Infrarenal abdominal aortic aneurysm status post aorto bi-iliac  stent graft placement. Native aneurysm sac measures 4.1 x 4.0 cm  today compared to maximum diameter 5.1 cm previously. No evidence  for endoleak.  3. Trace free fluid in the pelvis.  4. Biapical pleuroparenchymal scarring is similar to chest CT  02/25/2022 with prominent masslike irregular consolidative opacity  in the peripheral right lower lobe. Neoplasm not excluded. Close  follow-up recommended and PET-CT may be warranted to further  evaluate.  5. Emphysema (ICD10-J43.9) and Aortic Atherosclerosis (ICD10-170.0)

## 2022-05-31 ENCOUNTER — Other Ambulatory Visit: Payer: Self-pay

## 2022-05-31 ENCOUNTER — Ambulatory Visit (HOSPITAL_COMMUNITY)
Admission: RE | Admit: 2022-05-31 | Discharge: 2022-05-31 | Disposition: A | Discharge status: Home / Self Care | Payer: Medicare Other | Source: Ambulatory Visit | Attending: Internal Medicine | Admitting: Internal Medicine

## 2022-05-31 ENCOUNTER — Inpatient Hospital Stay: Payer: Medicare Other | Attending: Internal Medicine

## 2022-05-31 DIAGNOSIS — M8580 Other specified disorders of bone density and structure, unspecified site: Secondary | ICD-10-CM | POA: Diagnosis not present

## 2022-05-31 DIAGNOSIS — I10 Essential (primary) hypertension: Secondary | ICD-10-CM | POA: Insufficient documentation

## 2022-05-31 DIAGNOSIS — C3490 Malignant neoplasm of unspecified part of unspecified bronchus or lung: Secondary | ICD-10-CM | POA: Insufficient documentation

## 2022-05-31 DIAGNOSIS — I7143 Infrarenal abdominal aortic aneurysm, without rupture: Secondary | ICD-10-CM | POA: Insufficient documentation

## 2022-05-31 DIAGNOSIS — Z7982 Long term (current) use of aspirin: Secondary | ICD-10-CM | POA: Insufficient documentation

## 2022-05-31 DIAGNOSIS — J432 Centrilobular emphysema: Secondary | ICD-10-CM | POA: Insufficient documentation

## 2022-05-31 DIAGNOSIS — N281 Cyst of kidney, acquired: Secondary | ICD-10-CM | POA: Diagnosis not present

## 2022-05-31 DIAGNOSIS — C342 Malignant neoplasm of middle lobe, bronchus or lung: Secondary | ICD-10-CM | POA: Insufficient documentation

## 2022-05-31 DIAGNOSIS — E7800 Pure hypercholesterolemia, unspecified: Secondary | ICD-10-CM | POA: Diagnosis not present

## 2022-05-31 DIAGNOSIS — Z79899 Other long term (current) drug therapy: Secondary | ICD-10-CM | POA: Insufficient documentation

## 2022-05-31 DIAGNOSIS — N189 Chronic kidney disease, unspecified: Secondary | ICD-10-CM | POA: Insufficient documentation

## 2022-05-31 DIAGNOSIS — Z7984 Long term (current) use of oral hypoglycemic drugs: Secondary | ICD-10-CM | POA: Diagnosis not present

## 2022-05-31 DIAGNOSIS — I700 Atherosclerosis of aorta: Secondary | ICD-10-CM | POA: Diagnosis not present

## 2022-05-31 DIAGNOSIS — I714 Abdominal aortic aneurysm, without rupture, unspecified: Secondary | ICD-10-CM | POA: Diagnosis not present

## 2022-05-31 LAB — CBC WITH DIFFERENTIAL (CANCER CENTER ONLY)
Abs Immature Granulocytes: 0.02 10*3/uL (ref 0.00–0.07)
Basophils Absolute: 0 10*3/uL (ref 0.0–0.1)
Basophils Relative: 0 %
Eosinophils Absolute: 0.2 10*3/uL (ref 0.0–0.5)
Eosinophils Relative: 3 %
HCT: 38.5 % — ABNORMAL LOW (ref 39.0–52.0)
Hemoglobin: 12.9 g/dL — ABNORMAL LOW (ref 13.0–17.0)
Immature Granulocytes: 0 %
Lymphocytes Relative: 33 %
Lymphs Abs: 1.7 10*3/uL (ref 0.7–4.0)
MCH: 31 pg (ref 26.0–34.0)
MCHC: 33.5 g/dL (ref 30.0–36.0)
MCV: 92.5 fL (ref 80.0–100.0)
Monocytes Absolute: 0.6 10*3/uL (ref 0.1–1.0)
Monocytes Relative: 11 %
Neutro Abs: 2.7 10*3/uL (ref 1.7–7.7)
Neutrophils Relative %: 53 %
Platelet Count: 172 10*3/uL (ref 150–400)
RBC: 4.16 MIL/uL — ABNORMAL LOW (ref 4.22–5.81)
RDW: 13.8 % (ref 11.5–15.5)
WBC Count: 5.1 10*3/uL (ref 4.0–10.5)
nRBC: 0 % (ref 0.0–0.2)

## 2022-05-31 LAB — CMP (CANCER CENTER ONLY)
ALT: 16 U/L (ref 0–44)
AST: 23 U/L (ref 15–41)
Albumin: 3.8 g/dL (ref 3.5–5.0)
Alkaline Phosphatase: 76 U/L (ref 38–126)
Anion gap: 5 (ref 5–15)
BUN: 12 mg/dL (ref 8–23)
CO2: 29 mmol/L (ref 22–32)
Calcium: 9.8 mg/dL (ref 8.9–10.3)
Chloride: 106 mmol/L (ref 98–111)
Creatinine: 1.48 mg/dL — ABNORMAL HIGH (ref 0.61–1.24)
GFR, Estimated: 46 mL/min — ABNORMAL LOW (ref >60)
Glucose, Bld: 92 mg/dL (ref 70–99)
Potassium: 3.8 mmol/L (ref 3.5–5.1)
Sodium: 140 mmol/L (ref 135–145)
Total Bilirubin: 0.5 mg/dL (ref 0.3–1.2)
Total Protein: 7.5 g/dL (ref 6.5–8.1)

## 2022-06-02 ENCOUNTER — Other Ambulatory Visit: Payer: Self-pay

## 2022-06-02 ENCOUNTER — Telehealth: Payer: Self-pay | Admitting: Medical Oncology

## 2022-06-02 ENCOUNTER — Inpatient Hospital Stay (HOSPITAL_BASED_OUTPATIENT_CLINIC_OR_DEPARTMENT_OTHER): Payer: Medicare Other | Admitting: Internal Medicine

## 2022-06-02 VITALS — BP 124/57 | HR 88 | Temp 98.6°F | Resp 16 | Wt 164.6 lb

## 2022-06-02 DIAGNOSIS — C3490 Malignant neoplasm of unspecified part of unspecified bronchus or lung: Secondary | ICD-10-CM | POA: Diagnosis not present

## 2022-06-02 DIAGNOSIS — C342 Malignant neoplasm of middle lobe, bronchus or lung: Secondary | ICD-10-CM | POA: Diagnosis not present

## 2022-06-02 NOTE — Progress Notes (Signed)
[place] [phone]   [phone]  OFFICE PROGRESS NOTE  Tilda Franco, FNP [address] [place] [address]  DIAGNOSIS: Recurrent non-small cell lung cancer, squamous cell carcinoma presented with right lower lobe lung nodule in August 2021 initially diagnosed as stage IIB (T3, N0, M0) non-small cell lung cancer, invasive well-differentiated squamous cell carcinoma presented with right middle lobe lung mass.  PRIOR THERAPY:  1) Status post right VATS, right middle lobectomy with mediastinal lymph node sampling under the care of Dr. Roxan Hockey on 09/19/2017.  The tumor measured 4.2 cm but the carcinoma extends through the visceral pleura. 2) Adjuvant systemic chemotherapy with carboplatin for AUC of 6 and paclitaxel 200 mg/M2 every 3 weeks.  First dose 11/16/2017.  Status post 3 cycles.  Last dose was giving January 05, 2018 discontinued secondary to intolerance with significant peripheral neuropathy. 3) curative radiotherapy to the right lower lobe pulmonary nodule under the care of Dr. Tammi Klippel completed on December 21, 2019.  CURRENT THERAPY: Observation.  INTERVAL HISTORY: Danny Duke 87 y.o. male returns to the clinic today for follow-up visit.  The patient is feeling fine today with no concerning complaints except for recent colitis and he treated with a course of antibiotics.  He denied having any current chest pain but has shortness of breath with exertion with no cough or hemoptysis.  The patient has no nausea, vomiting, diarrhea or constipation.  He has no headache or visual changes.  He is here today for evaluation with repeat CT scan of the chest for restaging of his disease.  MEDICAL HISTORY: Past Medical History:  Diagnosis Date   AAA (abdominal aortic aneurysm) ([place])    Chronic back pain    Chronic kidney disease    Hypercholesterolemia    Hypertension    Right Lung dx'd 09/2017   s/p RMLobectomy 2019 and adjuvant  chemotherapy and SBRT    ALLERGIES:  has No Known Allergies.  MEDICATIONS:  Current Outpatient Medications  Medication Sig Dispense Refill   acetaminophen (TYLENOL) 500 MG tablet Take 1,000 mg by mouth every 6 (six) hours as needed for moderate pain or headache.     allopurinol (ZYLOPRIM) 100 MG tablet Take 100 mg by mouth daily.     amLODipine (NORVASC) 10 MG tablet Take 10 mg by mouth daily.     amoxicillin-clavulanate (AUGMENTIN) 875-125 MG tablet Take 1 tablet by mouth every 12 (twelve) hours. 14 tablet 0   aspirin EC 81 MG tablet Take 81 mg by mouth daily.     DULoxetine (CYMBALTA) 60 MG capsule TAKE 1 CAPSULE(60 MG) BY MOUTH DAILY 30 capsule 3   FARXIGA 10 MG TABS tablet Take 10 mg by mouth daily.     gabapentin (NEURONTIN) 300 MG capsule TAKE 3 CAPSULES(900 MG) BY MOUTH THREE TIMES DAILY 120 capsule 6   metoprolol succinate (TOPROL-XL) 25 MG 24 hr tablet Take 1 tablet (25 mg total) by mouth daily. 60 tablet 1   Oxycodone HCl 20 MG TABS Take 20 mg by mouth every 6 (six) hours as needed (pain).   0   simvastatin (ZOCOR) 20 MG tablet Take 20 mg by mouth daily.     Vitamin D, Ergocalciferol, (DRISDOL) 1.25 MG (50000 UNIT) CAPS capsule Take 50,000 Units by mouth once a week.     No current facility-administered medications for this visit.    SURGICAL HISTORY:  Past Surgical History:  Procedure Laterality Date   ABDOMINAL AORTIC ENDOVASCULAR STENT GRAFT  Right 02/03/2022   Procedure: ABDOMINAL AORTIC ENDOVASCULAR STENT GRAFT;  Surgeon: Serafina Mitchell, MD;  Location: [facility];  Service: Vascular;  Laterality: Right;   BRONCHIAL NEEDLE ASPIRATION BIOPSY  08/05/2017   Procedure: BRONCHIAL NEEDLE ASPIRATION BIOPSIES;  Surgeon: Juanito Doom, MD;  Location: [facility];  Service: Cardiopulmonary;;   ENDOBRONCHIAL ULTRASOUND Bilateral 08/05/2017   Procedure: ENDOBRONCHIAL ULTRASOUND;  Surgeon: Juanito Doom, MD;  Location: [facility];  Service: Cardiopulmonary;  Laterality:  Bilateral;   IR IMAGING GUIDED PORT INSERTION  12/27/2017   IR REMOVAL TUN ACCESS W/ PORT W/O FL MOD SED  03/25/2020   MEDIASTINOSCOPY N/A 09/19/2017   Procedure: MEDIASTINOSCOPY;  Surgeon: [name], MD;  Location: [facility];  Service: Thoracic;  Laterality: N/A;   MULTIPLE TOOTH EXTRACTIONS     [place] (VATS)/ LOBECTOMY Right 09/19/2017   Procedure: RIGHT VIDEO ASSISTED THORACOSCOPY (VATS)/ LOBECTOMY;  Surgeon: [name], MD;  Location: [facility];  Service: Thoracic;  Laterality: Right;    REVIEW OF SYSTEMS:  A comprehensive review of systems was negative.   PHYSICAL EXAMINATION: General appearance: alert, cooperative, and no distress Head: Normocephalic, without obvious abnormality, atraumatic Neck: no adenopathy, no JVD, supple, symmetrical, trachea midline, and thyroid not enlarged, symmetric, no tenderness/mass/nodules Lymph nodes: Cervical, supraclavicular, and axillary nodes normal. Resp: clear to auscultation bilaterally Back: symmetric, no curvature. ROM normal. No CVA tenderness. Cardio: regular rate and rhythm, S1, S2 normal, no murmur, click, rub or gallop GI: soft, non-tender; bowel sounds normal; no masses,  no organomegaly Extremities: extremities normal, atraumatic, no cyanosis or edema  ECOG PERFORMANCE STATUS: 1 - Symptomatic but completely ambulatory  Blood pressure (!) 124/57, pulse 88, temperature 98.6 F (37 C), temperature source Oral, resp. rate 16, weight 164 lb 9.6 oz (74.7 kg), SpO2 99 %.  LABORATORY DATA: Lab Results  Component Value Date   WBC 5.1 05/31/2022   HGB 12.9 (L) 05/31/2022   HCT 38.5 (L) 05/31/2022   MCV 92.5 05/31/2022   PLT 172 05/31/2022      Chemistry      Component Value Date/Time   NA 140 05/31/2022 1310   K 3.8 05/31/2022 1310   CL 106 05/31/2022 1310   CO2 29 05/31/2022 1310   BUN 12 05/31/2022 1310   CREATININE 1.48 (H) 05/31/2022 1310      Component Value Date/Time   CALCIUM 9.8  05/31/2022 1310   ALKPHOS 76 05/31/2022 1310   AST 23 05/31/2022 1310   ALT 16 05/31/2022 1310   BILITOT 0.5 05/31/2022 1310       RADIOGRAPHIC STUDIES: CT Chest Wo Contrast  Result Date: 06/01/2022 CLINICAL DATA:  Non-small-cell lung cancer staging. * Tracking Code: BO * EXAM: CT CHEST WITHOUT CONTRAST TECHNIQUE: Multidetector CT imaging of the chest was performed following the standard protocol without IV contrast. RADIATION DOSE REDUCTION: This exam was performed according to the departmental dose-optimization program which includes automated exposure control, adjustment of the mA and/or kV according to patient size and/or use of iterative reconstruction technique. COMPARISON:  CT angiogram 05/22/2022. Older CT scan 02/25/2022. Additional older exams as well. FINDINGS: Cardiovascular: Heart is nonenlarged. On this non IV contrast exam the thoracic aorta is of normal course and caliber. Scattered vascular calcifications are seen. Coronary artery calcifications are identified. No pericardial effusion. Mediastinum/Nodes: Patulous esophagus. Small thyroid gland on the right. Heterogeneous gland on the left. Lungs/Pleura: Advanced emphysematous lung changes are identified. Left lung has some calcified punctate nodules consistent with old granulomatous disease.  No consolidation, pneumothorax or effusion on the left. Apical pleural thickening there is a noncalcified nodule seen towards the lingula anteriorly today on series 7, image 58 measuring 4 mm and is unchanged. There is also a small spiculated like nodule in the left upper lobe more centrally on series 7, image 33 measuring up to 6 x 3 mm, also unchanged from previous. No new left-sided nodule. Right lung has volume loss with surgical changes. There are areas of again apical pleural thickening, interstitial septal thickening, scarring and fibrotic changes. Once again there is a spiculated nodular area peripherally in the right lower lobe retraction  and distortion. Previously this was measured at 2.0 x 1.5 cm and today 2.0 1.7 cm on series 7, image 65, not significantly changed when adjusting for technique. Ill-defined nodular areas in the right upper lobe are stable on image 38 of series 7 when adjusting for technique. Upper Abdomen: In the upper abdomen the adrenal glands are preserved. Musculoskeletal: Mild curvature of the thoracic spine. Scattered degenerative changes of the spine. Osteopenia. There are some bridging osteophytes and syndesmophytes. IMPRESSION: Essentially stable appearance of the thorax today. Once again surgical changes in the right lung from middle lobectomy. Associated right hemithorax volume loss. Stable areas of distortion, nodular ill-defined opacities in the right lung right-greater-than-left. Recommend continued surveillance. Advanced emphysematous changes. Multiple calcified lymph nodes and calcified lung nodules. There is 1 lymph node in the mediastinum, right paratracheal which is noncalcified but stable in size compared to the previous exam. Aortic Atherosclerosis (ICD10-I70.0) and Emphysema (ICD10-J43.9). Electronically Signed   By: Jill Side M.D.   On: 06/01/2022 16:11   CT Angio Chest/Abd/Pel for Dissection W and/or Wo Contrast  Result Date: 05/22/2022 CLINICAL DATA:  Thoracoabdominal aortic aneurysm. EXAM: CT ANGIOGRAPHY CHEST, ABDOMEN AND PELVIS TECHNIQUE: Non-contrast CT of the chest was initially obtained. Multidetector CT imaging through the chest, abdomen and pelvis was performed using the standard protocol during bolus administration of intravenous contrast. Multiplanar reconstructed images and MIPs were obtained and reviewed to evaluate the vascular anatomy. RADIATION DOSE REDUCTION: This exam was performed according to the departmental dose-optimization program which includes automated exposure control, adjustment of the mA and/or kV according to patient size and/or use of iterative reconstruction technique.  CONTRAST:  82mL OMNIPAQUE IOHEXOL 350 MG/ML SOLN COMPARISON:  03/09/2022 FINDINGS: CTA CHEST FINDINGS Cardiovascular: No thoracic aortic aneurysm. The heart size is normal. No substantial pericardial effusion. Coronary artery calcification is evident. Mediastinum/Nodes: Calcified nodal tissue is seen in the mediastinum in both hilar regions. Patulous esophagus is diffusely fluid-filled. There is no axillary lymphadenopathy. Lungs/Pleura: Centrilobular and paraseptal emphysema evident. Biapical pleuroparenchymal scarring is similar to chest CT 02/25/2022 with multiple irregular nodular densities scattered in the right upper lobe. Peripheral irregular subpleural consolidative opacity in the posterior right lower lobe is stable measuring 3.9 x 1.9 cm today which compares to 3.8 by 1.9 cm when remeasured in a similar fashion on the previous study. Surgical changes noted anterior right lung, stable. Calcified pleural plaque noted on the left. No pleural effusion. Musculoskeletal: Bones are demineralized. No worrisome lytic or sclerotic osseous abnormality. Review of the MIP images confirms the above findings. CTA ABDOMEN AND PELVIS FINDINGS VASCULAR Aorta: Infrarenal abdominal aortic aneurysm again noted status post aorto bi-iliac stent graft placement. Native aneurysm sac measures 4.1 x 4.0 cm today compared to maximum diameter 5.1 cm previously. No evidence for endoleak. Celiac: Patent without evidence of aneurysm, dissection, vasculitis or significant stenosis. SMA: Patent without evidence of aneurysm, dissection,  vasculitis or significant stenosis. Renals: Both main renal arteries are patent without evidence of aneurysm, dissection, vasculitis, fibromuscular dysplasia or significant stenosis. IMA: Chronic occlusion. 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 Atherosclerotic plaque in the left common iliac artery is stable compared to prior. No substantial change right common iliac  artery ectasia. Veins: No obvious venous abnormality within the limitations of this arterial phase study. Review of the MIP images confirms the above findings. NON-VASCULAR Hepatobiliary: No suspicious focal abnormality within the liver parenchyma. There is no evidence for gallstones, gallbladder wall thickening, or pericholecystic fluid. No intrahepatic or extrahepatic biliary dilation. Pancreas: No focal mass lesion. No dilatation of the main duct. No intraparenchymal cyst. No peripancreatic edema. Spleen: No splenomegaly. No focal mass lesion. Adrenals/Urinary Tract: No adrenal nodule or mass. Bilateral renal cysts again noted. No followup imaging is recommended. No evidence for hydroureter. The urinary bladder appears normal for the degree of distention. Stomach/Bowel: Stomach is unremarkable. No gastric wall thickening. No evidence of outlet obstruction. Duodenum is normally positioned as is the ligament of Treitz. No small bowel wall thickening. No small bowel dilatation. Circumferential wall thickening noted in the cecum and ascending colon with subtle pericolonic edema/inflammation. This area of the colon was relatively normal in appearance on the study from 2 months ago. Transverse and left colon unremarkable. Lymphatic: There is no gastrohepatic or hepatoduodenal ligament lymphadenopathy. No retroperitoneal or mesenteric lymphadenopathy. No pelvic sidewall lymphadenopathy. Reproductive: The prostate gland and seminal vesicles are unremarkable. Other: Trace free fluid is seen in the pelvis. Musculoskeletal: No worrisome lytic or sclerotic osseous abnormality. Review of the MIP images confirms the above findings. IMPRESSION: 1. Circumferential wall thickening in the cecum and ascending colon with subtle pericolonic edema/inflammation. This area of the colon was relatively normal in appearance on the study from 2 months ago. Imaging features are compatible with infectious/inflammatory colitis. Given the  relatively normal appearance 2 months ago, colorectal neoplasm/lymphoma is considered less likely 2. Infrarenal abdominal aortic aneurysm status post aorto bi-iliac stent graft placement. Native aneurysm sac measures 4.1 x 4.0 cm today compared to maximum diameter 5.1 cm previously. No evidence for endoleak. 3. Trace free fluid in the pelvis. 4. Biapical pleuroparenchymal scarring is similar to chest CT 02/25/2022 with prominent masslike irregular consolidative opacity in the peripheral right lower lobe. Neoplasm not excluded. Close follow-up recommended and PET-CT may be warranted to further evaluate. 5. Emphysema (ICD10-J43.9) and Aortic Atherosclerosis (ICD10-170.0) Electronically Signed   By: Misty Stanley M.D.   On: 05/22/2022 17:52     ASSESSMENT AND PLAN: This is a very pleasant 87 years old African-American male with likely recurrent lung cancer initially diagnosed as stage IIB (T3, N0, M0) invasive well-differentiated squamous cell carcinoma presented with right middle lobe lung mass status post right middle lobectomy with lymph node sampling on September 19, 2017 under the care of Dr. Roxan Hockey. The patient underwent adjuvant treatment with systemic chemotherapy with carboplatin for AUC of 6 and paclitaxel 200 mg/M2 every 3 weeks with Neulasta support status post 3 cycles. He tolerated this treatment well except for the chemotherapy-induced peripheral neuropathy and he discontinued his treatment after cycle #3. He underwent CT-guided core biopsy of the enlarging right lower lobe lung nodule and the final pathology was consistent with squamous cell carcinoma. He underwent SBRT to the right lower lobe lung nodule by Dr. Tammi Klippel and tolerated the procedure fairly well. The patient is currently on observation and he is feeling fine with no concerning complaints.  He had repeat CT scan of the chest performed recently.  I personally and independently reviewed the scan and discussed the result with the  patient and his wife. His scan showed no concerning findings for disease progression. I recommended for him to continue on observation with repeat CT scan of the chest in 6 months. He was advised to call immediately if he has any concerning symptoms in the interval. The patient voices understanding of current disease status and treatment options and is in agreement with the current care plan.  All questions were answered. The patient knows to call the clinic with any problems, questions or concerns. We can certainly see the patient much sooner if necessary.  Disclaimer: This note was dictated with voice recognition software. Similar sounding words can inadvertently be transcribed and may not be corrected upon review.

## 2022-06-02 NOTE — Telephone Encounter (Signed)
Pt on his way to appt.

## 2022-06-10 ENCOUNTER — Other Ambulatory Visit: Payer: Self-pay | Admitting: Internal Medicine

## 2022-09-24 ENCOUNTER — Encounter (HOSPITAL_BASED_OUTPATIENT_CLINIC_OR_DEPARTMENT_OTHER): Payer: Self-pay | Admitting: Emergency Medicine

## 2022-09-24 ENCOUNTER — Other Ambulatory Visit: Payer: Self-pay

## 2022-09-24 ENCOUNTER — Emergency Department (HOSPITAL_BASED_OUTPATIENT_CLINIC_OR_DEPARTMENT_OTHER)
Admission: EM | Admit: 2022-09-24 | Discharge: 2022-09-24 | Disposition: A | Discharge status: Home / Self Care | Payer: Medicare Other | Source: Home / Self Care | Attending: Emergency Medicine | Admitting: Emergency Medicine

## 2022-09-24 ENCOUNTER — Emergency Department (HOSPITAL_BASED_OUTPATIENT_CLINIC_OR_DEPARTMENT_OTHER): Payer: Medicare Other | Admitting: Radiology

## 2022-09-24 DIAGNOSIS — Z85118 Personal history of other malignant neoplasm of bronchus and lung: Secondary | ICD-10-CM | POA: Insufficient documentation

## 2022-09-24 DIAGNOSIS — N189 Chronic kidney disease, unspecified: Secondary | ICD-10-CM | POA: Diagnosis not present

## 2022-09-24 DIAGNOSIS — R9389 Abnormal findings on diagnostic imaging of other specified body structures: Secondary | ICD-10-CM | POA: Diagnosis not present

## 2022-09-24 DIAGNOSIS — R630 Anorexia: Secondary | ICD-10-CM | POA: Diagnosis not present

## 2022-09-24 DIAGNOSIS — R059 Cough, unspecified: Secondary | ICD-10-CM | POA: Diagnosis present

## 2022-09-24 DIAGNOSIS — I129 Hypertensive chronic kidney disease with stage 1 through stage 4 chronic kidney disease, or unspecified chronic kidney disease: Secondary | ICD-10-CM | POA: Diagnosis not present

## 2022-09-24 DIAGNOSIS — U071 COVID-19: Secondary | ICD-10-CM | POA: Diagnosis not present

## 2022-09-24 LAB — URINALYSIS, W/ REFLEX TO CULTURE (INFECTION SUSPECTED)
Bacteria, UA: NONE SEEN
Bilirubin Urine: NEGATIVE
Glucose, UA: 500 mg/dL — AB
Ketones, ur: NEGATIVE mg/dL
Leukocytes,Ua: NEGATIVE
Nitrite: NEGATIVE
Protein, ur: 30 mg/dL — AB
Specific Gravity, Urine: 1.019 (ref 1.005–1.030)
pH: 6 (ref 5.0–8.0)

## 2022-09-24 LAB — CBC WITH DIFFERENTIAL/PLATELET
Abs Immature Granulocytes: 0.02 10*3/uL (ref 0.00–0.07)
Basophils Absolute: 0 10*3/uL (ref 0.0–0.1)
Basophils Relative: 0 %
Eosinophils Absolute: 0 10*3/uL (ref 0.0–0.5)
Eosinophils Relative: 0 %
HCT: 37.8 % — ABNORMAL LOW (ref 39.0–52.0)
Hemoglobin: 12.7 g/dL — ABNORMAL LOW (ref 13.0–17.0)
Immature Granulocytes: 0 %
Lymphocytes Relative: 13 %
Lymphs Abs: 0.8 10*3/uL (ref 0.7–4.0)
MCH: 30.5 pg (ref 26.0–34.0)
MCHC: 33.6 g/dL (ref 30.0–36.0)
MCV: 90.6 fL (ref 80.0–100.0)
Monocytes Absolute: 0.9 10*3/uL (ref 0.1–1.0)
Monocytes Relative: 15 %
Neutro Abs: 4.3 10*3/uL (ref 1.7–7.7)
Neutrophils Relative %: 72 %
Platelets: 132 10*3/uL — ABNORMAL LOW (ref 150–400)
RBC: 4.17 MIL/uL — ABNORMAL LOW (ref 4.22–5.81)
RDW: 14.2 % (ref 11.5–15.5)
WBC: 6 10*3/uL (ref 4.0–10.5)
nRBC: 0 % (ref 0.0–0.2)

## 2022-09-24 LAB — RESP PANEL BY RT-PCR (RSV, FLU A&B, COVID)  RVPGX2
Influenza A by PCR: NEGATIVE
Influenza B by PCR: NEGATIVE
Resp Syncytial Virus by PCR: NEGATIVE
SARS Coronavirus 2 by RT PCR: POSITIVE — AB

## 2022-09-24 LAB — COMPREHENSIVE METABOLIC PANEL
ALT: 14 U/L (ref 0–44)
AST: 34 U/L (ref 15–41)
Albumin: 4.3 g/dL (ref 3.5–5.0)
Alkaline Phosphatase: 58 U/L (ref 38–126)
Anion gap: 9 (ref 5–15)
BUN: 11 mg/dL (ref 8–23)
CO2: 24 mmol/L (ref 22–32)
Calcium: 9.7 mg/dL (ref 8.9–10.3)
Chloride: 100 mmol/L (ref 98–111)
Creatinine, Ser: 1.21 mg/dL (ref 0.61–1.24)
GFR, Estimated: 59 mL/min — ABNORMAL LOW (ref >60)
Glucose, Bld: 127 mg/dL — ABNORMAL HIGH (ref 70–99)
Potassium: 4.3 mmol/L (ref 3.5–5.1)
Sodium: 133 mmol/L — ABNORMAL LOW (ref 135–145)
Total Bilirubin: 0.6 mg/dL (ref 0.3–1.2)
Total Protein: 8 g/dL (ref 6.5–8.1)

## 2022-09-24 LAB — PROTIME-INR
INR: 1.1 (ref 0.8–1.2)
Prothrombin Time: 13.9 seconds (ref 11.4–15.2)

## 2022-09-24 LAB — APTT: aPTT: 28 seconds (ref 24–36)

## 2022-09-24 LAB — LACTIC ACID, PLASMA: Lactic Acid, Venous: 1.7 mmol/L (ref 0.5–1.9)

## 2022-09-24 MED ORDER — BENZONATATE 100 MG PO CAPS: 100.0000 mg | ORAL_CAPSULE | Freq: Three times a day (TID) | ORAL | 0 refills | Status: DC | PRN

## 2022-09-24 MED ORDER — MOLNUPIRAVIR EUA 200MG CAPSULE: 4.0000 | ORAL_CAPSULE | Freq: Two times a day (BID) | ORAL | 0 refills | Status: AC

## 2022-09-24 MED ORDER — ACETAMINOPHEN 325 MG PO TABS
650.0000 mg | ORAL_TABLET | Freq: Once | ORAL | Status: AC
  Administered 2022-09-24: 650 mg via ORAL

## 2022-09-24 NOTE — ED Triage Notes (Signed)
Pt presents to ED POV. Pt c/o cough, HA, body ache, and hiccups since yesterday. Pt denies fever at home

## 2022-09-24 NOTE — ED Notes (Signed)
Reviewed AVS/discharge instruction with patient. Time allotted for and all questions answered. Patient is agreeable for d/c and escorted to ed exit by staff.  

## 2022-09-24 NOTE — Discharge Instructions (Addendum)
You were seen in the ER for evaluation of your symptoms. It was discovered that you have COVID. For this, I am prescribing you molnupiravir. Please take as prescribed. Please follow up with your PCP in the next few days for re-evaluation. Please follow up with your PCP/oncologist about your imaging findings today.  Make sure that you are eating and drinking plenty of fluids.  Additionally, prescribing a medication called Tessalon for you to take as needed for coughing.  If you do not think that this helps too much, you can try over-the-counter Robitussin.  If you have any concerns, new or worsening symptoms, please return to your nearest emergency department for evaluation.  Get help right away if: You have trouble breathing or get short of breath. You have pain or pressure in your chest. You cannot speak or move any part of your body. You are confused. Your symptoms get worse. These symptoms may be an emergency. Get help right away. Call 911. Do not wait to see if the symptoms will go away. Do not drive yourself to the hospital.

## 2022-09-25 LAB — CULTURE, BLOOD (ROUTINE X 2)

## 2022-09-25 NOTE — ED Provider Notes (Signed)
[place] EMERGENCY DEPARTMENT AT [facility] Provider Note   CSN: [id number] Arrival date & time: 09/24/22  1355     History {Add pertinent medical, surgical, social history, OB history to HPI:1} Chief Complaint  Patient presents with   Cough    Danny Duke is a 87 y.o. male.   Cough      Home Medications Prior to Admission medications   Medication Sig Start Date End Date Taking? Authorizing Provider  benzonatate (TESSALON) 100 MG capsule Take 1 capsule (100 mg total) by mouth every 8 (eight) hours as needed for cough. 09/24/22  Yes Achille Rich, PA-C  molnupiravir EUA (LAGEVRIO) 200 mg CAPS capsule Take 4 capsules (800 mg total) by mouth 2 (two) times daily for 5 days. 09/24/22 09/29/22 Yes Achille Rich, PA-C  acetaminophen (TYLENOL) 500 MG tablet Take 1,000 mg by mouth every 6 (six) hours as needed for moderate pain or headache.    [provider]  allopurinol (ZYLOPRIM) 100 MG tablet Take 100 mg by mouth daily. 10/23/21   [provider]  amLODipine (NORVASC) 10 MG tablet Take 10 mg by mouth daily.    [provider]  amoxicillin-clavulanate (AUGMENTIN) 875-125 MG tablet Take 1 tablet by mouth every 12 (twelve) hours. 05/22/22   Ernie Avena, MD  aspirin EC 81 MG tablet Take 81 mg by mouth daily.    [provider]  DULoxetine (CYMBALTA) 60 MG capsule TAKE 1 CAPSULE(60 MG) BY MOUTH DAILY 06/10/22   Vaslow, Georgeanna Lea, MD  FARXIGA 10 MG TABS tablet Take 10 mg by mouth daily. 01/19/22   [provider]  gabapentin (NEURONTIN) 300 MG capsule TAKE 3 CAPSULES(900 MG) BY MOUTH THREE TIMES DAILY 10/06/20   Vaslow, Georgeanna Lea, MD  metoprolol succinate (TOPROL-XL) 25 MG 24 hr tablet Take 1 tablet (25 mg total) by mouth daily. 09/26/17   Gold, Glenice Laine, PA-C  Oxycodone HCl 20 MG TABS Take 20 mg by mouth every 6 (six) hours as needed (pain).  10/31/17   [provider]  simvastatin (ZOCOR) 20 MG tablet Take 20 mg by mouth daily.     [provider]  Vitamin D, Ergocalciferol, (DRISDOL) 1.25 MG (50000 UNIT) CAPS capsule Take 50,000 Units by mouth once a week. 12/18/21   [provider]      Allergies    Patient has no known allergies.    Review of Systems   Review of Systems  Respiratory:  Positive for cough.     Physical Exam Updated Vital Signs BP 116/76 (BP Location: Right Arm)   Pulse 82   Temp 99.2 F (37.3 C) (Oral)   Resp 18   SpO2 98%  Physical Exam  ED Results / Procedures / Treatments   Labs (all labs ordered are listed, but only abnormal results are displayed) Labs Reviewed  RESP PANEL BY RT-PCR (RSV, FLU A&B, COVID)  RVPGX2 - Abnormal; Notable for the following components:      Result Value   SARS Coronavirus 2 by RT PCR POSITIVE (*)    All other components within normal limits  COMPREHENSIVE METABOLIC PANEL - Abnormal; Notable for the following components:   Sodium 133 (*)    Glucose, Bld 127 (*)    GFR, Estimated 59 (*)    All other components within normal limits  CBC WITH DIFFERENTIAL/PLATELET - Abnormal; Notable for the following components:   RBC 4.17 (*)    Hemoglobin 12.7 (*)    HCT 37.8 (*)  Platelets 132 (*)    All other components within normal limits  URINALYSIS, W/ REFLEX TO CULTURE (INFECTION SUSPECTED) - Abnormal; Notable for the following components:   Glucose, UA 500 (*)    Hgb urine dipstick SMALL (*)    Protein, ur 30 (*)    All other components within normal limits  CULTURE, BLOOD (ROUTINE X 2)  LACTIC ACID, PLASMA  PROTIME-INR  APTT    EKG EKG Interpretation Date/Time:  Friday September 24 2022 16:29:54 EDT Ventricular Rate:  86 PR Interval:  146 QRS Duration:  86 QT Interval:  346 QTC Calculation: 414 R Axis:   19  Text Interpretation: Sinus rhythm Abnormal R-wave progression, early transition LVH with secondary repolarization abnormality Confirmed by Vanetta Mulders [phone]) on 09/24/2022 4:37:10 PM  Radiology DG Chest 2  View  Result Date: 09/24/2022 CLINICAL DATA:  Sepsis EXAM: CHEST - 2 VIEW COMPARISON:  10/22/2019. FINDINGS: Unremarkable cardiac silhouette. There is chronic-appearing interstitial prominence most consistent with scarring. Small right-sided pleural effusion. No pneumothorax identified. Nodular cm sized lesion left apex may be a new finding and can be assessed further with CT. IMPRESSION: Chronic appearing interstitial changes most consistent with scarring. Right-sided small pleural effusion. Left apical nodular lesion. Consider CT correlation. Electronically Signed   By: Layla Maw M.D.   On: 09/24/2022 15:18    Procedures Procedures  {Document cardiac monitor, telemetry assessment procedure when appropriate:1}  Medications Ordered in ED Medications  acetaminophen (TYLENOL) tablet 650 mg (650 mg Oral Given 09/24/22 1414)    ED Course/ Medical Decision Making/ A&P   {   Click here for ABCD2, HEART and other calculatorsREFRESH Note before signing :1}                          Medical Decision Making Amount and/or Complexity of Data Reviewed Labs: ordered. Radiology: ordered. ECG/medicine tests: ordered.  Risk OTC drugs. Prescription drug management.   ***  {Document critical care time when appropriate:1} {Document review of labs and clinical decision tools ie heart score, Chads2Vasc2 etc:1}  {Document your independent review of radiology images, and any outside records:1} {Document your discussion with family members, caretakers, and with consultants:1} {Document social determinants of health affecting pt's care:1} {Document your decision making why or why not admission, treatments were needed:1} Final Clinical Impression(s) / ED Diagnoses Final diagnoses:  Abnormal finding on imaging  COVID    Rx / DC Orders ED Discharge Orders          Ordered    molnupiravir EUA (LAGEVRIO) 200 mg CAPS capsule  2 times daily        09/24/22 1709    benzonatate (TESSALON) 100 MG  capsule  Every 8 hours PRN        09/24/22 1709

## 2022-09-27 LAB — CULTURE, BLOOD (ROUTINE X 2): Culture: NO GROWTH

## 2022-09-29 LAB — CULTURE, BLOOD (ROUTINE X 2)

## 2022-10-04 ENCOUNTER — Emergency Department (HOSPITAL_BASED_OUTPATIENT_CLINIC_OR_DEPARTMENT_OTHER): Payer: Medicare Other

## 2022-10-04 ENCOUNTER — Emergency Department (HOSPITAL_BASED_OUTPATIENT_CLINIC_OR_DEPARTMENT_OTHER)
Admission: EM | Admit: 2022-10-04 | Discharge: 2022-10-04 | Disposition: A | Discharge status: Home / Self Care | Payer: Medicare Other | Attending: Emergency Medicine | Admitting: Emergency Medicine

## 2022-10-04 ENCOUNTER — Encounter (HOSPITAL_BASED_OUTPATIENT_CLINIC_OR_DEPARTMENT_OTHER): Payer: Self-pay

## 2022-10-04 ENCOUNTER — Encounter: Payer: Self-pay | Admitting: Internal Medicine

## 2022-10-04 ENCOUNTER — Other Ambulatory Visit (HOSPITAL_BASED_OUTPATIENT_CLINIC_OR_DEPARTMENT_OTHER): Payer: Self-pay

## 2022-10-04 ENCOUNTER — Other Ambulatory Visit: Payer: Self-pay

## 2022-10-04 DIAGNOSIS — Z8616 Personal history of COVID-19: Secondary | ICD-10-CM | POA: Diagnosis not present

## 2022-10-04 DIAGNOSIS — Z7982 Long term (current) use of aspirin: Secondary | ICD-10-CM | POA: Diagnosis not present

## 2022-10-04 DIAGNOSIS — Z79899 Other long term (current) drug therapy: Secondary | ICD-10-CM | POA: Insufficient documentation

## 2022-10-04 DIAGNOSIS — R9431 Abnormal electrocardiogram [ECG] [EKG]: Secondary | ICD-10-CM | POA: Insufficient documentation

## 2022-10-04 DIAGNOSIS — W010XXA Fall on same level from slipping, tripping and stumbling without subsequent striking against object, initial encounter: Secondary | ICD-10-CM | POA: Insufficient documentation

## 2022-10-04 DIAGNOSIS — I129 Hypertensive chronic kidney disease with stage 1 through stage 4 chronic kidney disease, or unspecified chronic kidney disease: Secondary | ICD-10-CM | POA: Diagnosis not present

## 2022-10-04 DIAGNOSIS — R059 Cough, unspecified: Secondary | ICD-10-CM | POA: Insufficient documentation

## 2022-10-04 DIAGNOSIS — R0781 Pleurodynia: Secondary | ICD-10-CM | POA: Diagnosis present

## 2022-10-04 DIAGNOSIS — I517 Cardiomegaly: Secondary | ICD-10-CM | POA: Insufficient documentation

## 2022-10-04 DIAGNOSIS — I6782 Cerebral ischemia: Secondary | ICD-10-CM | POA: Diagnosis not present

## 2022-10-04 DIAGNOSIS — S2231XA Fracture of one rib, right side, initial encounter for closed fracture: Secondary | ICD-10-CM | POA: Diagnosis not present

## 2022-10-04 DIAGNOSIS — N189 Chronic kidney disease, unspecified: Secondary | ICD-10-CM | POA: Diagnosis not present

## 2022-10-04 LAB — CBC WITH DIFFERENTIAL/PLATELET
Abs Immature Granulocytes: 0.01 10*3/uL (ref 0.00–0.07)
Basophils Absolute: 0 10*3/uL (ref 0.0–0.1)
Basophils Relative: 0 %
Eosinophils Absolute: 0.1 10*3/uL (ref 0.0–0.5)
Eosinophils Relative: 3 %
HCT: 38.7 % — ABNORMAL LOW (ref 39.0–52.0)
Hemoglobin: 12.8 g/dL — ABNORMAL LOW (ref 13.0–17.0)
Immature Granulocytes: 0 %
Lymphocytes Relative: 42 %
Lymphs Abs: 1.6 10*3/uL (ref 0.7–4.0)
MCH: 30.5 pg (ref 26.0–34.0)
MCHC: 33.1 g/dL (ref 30.0–36.0)
MCV: 92.1 fL (ref 80.0–100.0)
Monocytes Absolute: 0.5 10*3/uL (ref 0.1–1.0)
Monocytes Relative: 15 %
Neutro Abs: 1.5 10*3/uL — ABNORMAL LOW (ref 1.7–7.7)
Neutrophils Relative %: 40 %
Platelets: 142 10*3/uL — ABNORMAL LOW (ref 150–400)
RBC: 4.2 MIL/uL — ABNORMAL LOW (ref 4.22–5.81)
RDW: 14.5 % (ref 11.5–15.5)
WBC: 3.7 10*3/uL — ABNORMAL LOW (ref 4.0–10.5)
nRBC: 0 % (ref 0.0–0.2)

## 2022-10-04 LAB — COMPREHENSIVE METABOLIC PANEL
ALT: 11 U/L (ref 0–44)
AST: 23 U/L (ref 15–41)
Albumin: 4 g/dL (ref 3.5–5.0)
Alkaline Phosphatase: 55 U/L (ref 38–126)
Anion gap: 6 (ref 5–15)
BUN: 13 mg/dL (ref 8–23)
CO2: 26 mmol/L (ref 22–32)
Calcium: 9.7 mg/dL (ref 8.9–10.3)
Chloride: 106 mmol/L (ref 98–111)
Creatinine, Ser: 1.25 mg/dL — ABNORMAL HIGH (ref 0.61–1.24)
GFR, Estimated: 56 mL/min — ABNORMAL LOW (ref >60)
Glucose, Bld: 96 mg/dL (ref 70–99)
Potassium: 4.1 mmol/L (ref 3.5–5.1)
Sodium: 138 mmol/L (ref 135–145)
Total Bilirubin: 0.3 mg/dL (ref 0.3–1.2)
Total Protein: 7.6 g/dL (ref 6.5–8.1)

## 2022-10-04 LAB — TROPONIN I (HIGH SENSITIVITY)
Troponin I (High Sensitivity): 4 ng/L (ref <18)
Troponin I (High Sensitivity): 4 ng/L (ref <18)

## 2022-10-04 LAB — BRAIN NATRIURETIC PEPTIDE: B Natriuretic Peptide: 88.7 pg/mL (ref 0.0–100.0)

## 2022-10-04 MED ORDER — FENTANYL CITRATE PF 50 MCG/ML IJ SOSY
25.0000 ug | PREFILLED_SYRINGE | Freq: Once | INTRAMUSCULAR | Status: AC
  Administered 2022-10-04: 25 ug via INTRAVENOUS
  Filled 2022-10-04: qty 1

## 2022-10-04 MED ORDER — LIDOCAINE 5 % EX PTCH
1.0000 | MEDICATED_PATCH | CUTANEOUS | 0 refills | Status: DC
  Filled 2022-10-04: qty 30, 30d supply, fill #0

## 2022-10-04 MED ORDER — OXYCODONE HCL 5 MG PO TABS
5.0000 mg | ORAL_TABLET | Freq: Three times a day (TID) | ORAL | 0 refills | Status: DC | PRN
  Filled 2022-10-04: qty 10, 4d supply, fill #0

## 2022-10-04 MED ORDER — IOHEXOL 350 MG/ML SOLN
100.0000 mL | Freq: Once | INTRAVENOUS | Status: AC | PRN
  Administered 2022-10-04: 100 mL via INTRAVENOUS

## 2022-10-04 MED ORDER — LIDOCAINE 5 % EX PTCH
1.0000 | MEDICATED_PATCH | CUTANEOUS | Status: DC
  Administered 2022-10-04: 1 via TRANSDERMAL
  Filled 2022-10-04: qty 1

## 2022-10-04 NOTE — ED Provider Notes (Signed)
CT scan of the chest showed no evidence of pneumonia or PE.  Stable masslike area seen on prior CTs.  1 rib fracture on the right suspect that is the cause of his pain.  Lidocaine patches and Roxicodone written for breakthrough pain.  He understands the use of these medications as well as Tylenol.  Lab work was otherwise unremarkable per my review and interpretation.  Troponin normal.  Overall recommend follow-up with primary care doctor.  Told return to the ED if symptoms worsen.  This chart was dictated using voice recognition software.  Despite best efforts to proofread,  errors can occur which can change the documentation meaning.    Virgina Norfolk, DO 10/04/22 1659

## 2022-10-04 NOTE — Discharge Instructions (Addendum)
Overall you have a rib fracture on the right.  Take 1000 mg of Tylenol every 6 hours as needed for pain.  Follow-up with your primary care doctor for further pain management if needed.  I have also prescribed you lidocaine patches to help with pain.  Take as prescribed.  Have also written you for narcotic pain medicine called Roxicodone for breakthrough pain.  This medication is sedating so please be careful with its use.  Do not mix with alcohol or drugs or dangerous activities including driving.

## 2022-10-04 NOTE — ED Provider Notes (Signed)
[place] EMERGENCY DEPARTMENT AT [facility] Provider Note   CSN: [id number] Arrival date & time: 10/04/22  4098     History {Add pertinent medical, surgical, social history, OB history to HPI:1} Chief Complaint  Patient presents with   Rib Injury    Danny Duke is a 87 y.o. male.  HPI      Tripped and fell on right side about 1 week ago Did not want to go to Dr about it at first. Pain worsening Significan tpain.   Pain right rib and up into axialla and towards the back Not worse with deep breaths just moving Since here is having it like aching and burning No shortness of breath Has had a cough, just continuing on since covid, not worsening Very painful, has not felt pain like this before No abdominal pain No fever No nausea, vomiting, or diarrhea No lightheadedness Pain now 10/10 , with move it is worse.  Didn't pass out, thinks head may have hit shoe container   Home Medications Prior to Admission medications   Medication Sig Start Date End Date Taking? Authorizing Provider  acetaminophen (TYLENOL) 500 MG tablet Take 1,000 mg by mouth every 6 (six) hours as needed for moderate pain or headache.    [provider]  allopurinol (ZYLOPRIM) 100 MG tablet Take 100 mg by mouth daily. 10/23/21   [provider]  amLODipine (NORVASC) 10 MG tablet Take 10 mg by mouth daily.    [provider]  amoxicillin-clavulanate (AUGMENTIN) 875-125 MG tablet Take 1 tablet by mouth every 12 (twelve) hours. 05/22/22   Ernie Avena, MD  aspirin EC 81 MG tablet Take 81 mg by mouth daily.    [provider]  benzonatate (TESSALON) 100 MG capsule Take 1 capsule (100 mg total) by mouth every 8 (eight) hours as needed for cough. 09/24/22   Achille Rich, PA-C  DULoxetine (CYMBALTA) 60 MG capsule TAKE 1 CAPSULE(60 MG) BY MOUTH DAILY 06/10/22   Vaslow, Georgeanna Lea, MD  FARXIGA 10 MG TABS tablet Take 10 mg by mouth daily. 01/19/22   [provider]  gabapentin (NEURONTIN) 300 MG capsule TAKE 3 CAPSULES(900 MG) BY MOUTH THREE TIMES DAILY 10/06/20   Vaslow, Georgeanna Lea, MD  metoprolol succinate (TOPROL-XL) 25 MG 24 hr tablet Take 1 tablet (25 mg total) by mouth daily. 09/26/17   Gold, Glenice Laine, PA-C  Oxycodone HCl 20 MG TABS Take 20 mg by mouth every 6 (six) hours as needed (pain).  10/31/17   [provider]  simvastatin (ZOCOR) 20 MG tablet Take 20 mg by mouth daily.    [provider]  Vitamin D, Ergocalciferol, (DRISDOL) 1.25 MG (50000 UNIT) CAPS capsule Take 50,000 Units by mouth once a week. 12/18/21   [provider]      Allergies    Patient has no known allergies.    Review of Systems   Review of Systems  Physical Exam Updated Vital Signs BP 119/77   Pulse 75   Temp 98.2 F (36.8 C) (Oral)   Resp 18   Ht 5\' 8"  (1.727 m)   Wt 73.9 kg   SpO2 100%   BMI 24.78 kg/m  Physical Exam  ED Results / Procedures / Treatments   Labs (all labs ordered are listed, but only abnormal results are displayed) Labs Reviewed  CBC WITH DIFFERENTIAL/PLATELET - Abnormal; Notable for the following components:      Result Value   WBC 3.7 (*)    RBC  4.20 (*)    Hemoglobin 12.8 (*)    HCT 38.7 (*)    Platelets 142 (*)    Neutro Abs 1.5 (*)    All other components within normal limits  COMPREHENSIVE METABOLIC PANEL  BRAIN NATRIURETIC PEPTIDE  TROPONIN I (HIGH SENSITIVITY)  TROPONIN I (HIGH SENSITIVITY)    EKG None  Radiology No results found.  Procedures Procedures  {Document cardiac monitor, telemetry assessment procedure when appropriate:1}  Medications Ordered in ED Medications - No data to display  ED Course/ Medical Decision Making/ A&P   {   Click here for ABCD2, HEART and other calculatorsREFRESH Note before signing :1}                          Medical Decision Making Amount and/or Complexity of Data Reviewed Labs: ordered.   ***  {Document critical care time when  appropriate:1} {Document review of labs and clinical decision tools ie heart score, Chads2Vasc2 etc:1}  {Document your independent review of radiology images, and any outside records:1} {Document your discussion with family members, caretakers, and with consultants:1} {Document social determinants of health affecting pt's care:1} {Document your decision making why or why not admission, treatments were needed:1} Final Clinical Impression(s) / ED Diagnoses Final diagnoses:  None    Rx / DC Orders ED Discharge Orders     None

## 2022-10-04 NOTE — ED Triage Notes (Signed)
Pt presents with pain in right rib area below axilla. Pt is just getting over Covid, diagnosed on July 19.

## 2022-10-04 NOTE — ED Notes (Signed)
 RN reviewed discharge instructions with pt. Pt verbalized understanding and had no further questions. VSS upon discharge.  

## 2022-10-08 ENCOUNTER — Other Ambulatory Visit: Payer: Self-pay | Admitting: Internal Medicine

## 2022-10-11 ENCOUNTER — Encounter: Payer: Self-pay | Admitting: Internal Medicine

## 2022-11-10 ENCOUNTER — Telehealth: Payer: Self-pay | Admitting: Medical Oncology

## 2022-11-11 ENCOUNTER — Telehealth: Payer: Self-pay | Admitting: Internal Medicine

## 2022-11-11 ENCOUNTER — Encounter: Payer: Self-pay | Admitting: Internal Medicine

## 2022-11-11 NOTE — Telephone Encounter (Signed)
Confirmed appts . 

## 2022-11-11 NOTE — Telephone Encounter (Signed)
Called patient regarding September appointments, patient is notified.

## 2022-11-30 ENCOUNTER — Ambulatory Visit (HOSPITAL_COMMUNITY)
Admission: RE | Admit: 2022-11-30 | Discharge: 2022-11-30 | Disposition: A | Discharge status: Home / Self Care | Payer: Medicare Other | Source: Ambulatory Visit | Attending: Internal Medicine | Admitting: Internal Medicine

## 2022-11-30 ENCOUNTER — Inpatient Hospital Stay: Payer: Medicare Other | Attending: Internal Medicine

## 2022-11-30 DIAGNOSIS — C3490 Malignant neoplasm of unspecified part of unspecified bronchus or lung: Secondary | ICD-10-CM | POA: Insufficient documentation

## 2022-11-30 DIAGNOSIS — Z9221 Personal history of antineoplastic chemotherapy: Secondary | ICD-10-CM | POA: Diagnosis not present

## 2022-11-30 DIAGNOSIS — C342 Malignant neoplasm of middle lobe, bronchus or lung: Secondary | ICD-10-CM | POA: Diagnosis present

## 2022-11-30 DIAGNOSIS — C3431 Malignant neoplasm of lower lobe, right bronchus or lung: Secondary | ICD-10-CM | POA: Insufficient documentation

## 2022-11-30 DIAGNOSIS — Z902 Acquired absence of lung [part of]: Secondary | ICD-10-CM | POA: Insufficient documentation

## 2022-11-30 DIAGNOSIS — Z923 Personal history of irradiation: Secondary | ICD-10-CM | POA: Diagnosis not present

## 2022-11-30 LAB — CBC WITH DIFFERENTIAL (CANCER CENTER ONLY)
Abs Immature Granulocytes: 0.02 10*3/uL (ref 0.00–0.07)
Basophils Absolute: 0 10*3/uL (ref 0.0–0.1)
Basophils Relative: 0 %
Eosinophils Absolute: 0.2 10*3/uL (ref 0.0–0.5)
Eosinophils Relative: 5 %
HCT: 38.9 % — ABNORMAL LOW (ref 39.0–52.0)
Hemoglobin: 12.5 g/dL — ABNORMAL LOW (ref 13.0–17.0)
Immature Granulocytes: 1 %
Lymphocytes Relative: 34 %
Lymphs Abs: 1.5 10*3/uL (ref 0.7–4.0)
MCH: 30.7 pg (ref 26.0–34.0)
MCHC: 32.1 g/dL (ref 30.0–36.0)
MCV: 95.6 fL (ref 80.0–100.0)
Monocytes Absolute: 0.6 10*3/uL (ref 0.1–1.0)
Monocytes Relative: 14 %
Neutro Abs: 2.1 10*3/uL (ref 1.7–7.7)
Neutrophils Relative %: 46 %
Platelet Count: 155 10*3/uL (ref 150–400)
RBC: 4.07 MIL/uL — ABNORMAL LOW (ref 4.22–5.81)
RDW: 14.4 % (ref 11.5–15.5)
WBC Count: 4.4 10*3/uL (ref 4.0–10.5)
nRBC: 0 % (ref 0.0–0.2)

## 2022-11-30 LAB — CMP (CANCER CENTER ONLY)
ALT: 10 U/L (ref 0–44)
AST: 19 U/L (ref 15–41)
Albumin: 3.7 g/dL (ref 3.5–5.0)
Alkaline Phosphatase: 74 U/L (ref 38–126)
Anion gap: 3 — ABNORMAL LOW (ref 5–15)
BUN: 10 mg/dL (ref 8–23)
CO2: 28 mmol/L (ref 22–32)
Calcium: 9.5 mg/dL (ref 8.9–10.3)
Chloride: 109 mmol/L (ref 98–111)
Creatinine: 1.23 mg/dL (ref 0.61–1.24)
GFR, Estimated: 58 mL/min — ABNORMAL LOW (ref >60)
Glucose, Bld: 100 mg/dL — ABNORMAL HIGH (ref 70–99)
Potassium: 4.3 mmol/L (ref 3.5–5.1)
Sodium: 140 mmol/L (ref 135–145)
Total Bilirubin: 0.5 mg/dL (ref 0.3–1.2)
Total Protein: 7.5 g/dL (ref 6.5–8.1)

## 2022-12-02 ENCOUNTER — Ambulatory Visit: Payer: Medicare Other | Admitting: Internal Medicine

## 2022-12-06 ENCOUNTER — Ambulatory Visit: Payer: Medicare Other | Admitting: Internal Medicine

## 2022-12-07 ENCOUNTER — Inpatient Hospital Stay: Payer: Medicare HMO | Attending: Internal Medicine | Admitting: Internal Medicine

## 2022-12-07 VITALS — BP 138/81 | HR 92 | Temp 97.9°F | Resp 17 | Ht 68.0 in | Wt 160.9 lb

## 2022-12-07 DIAGNOSIS — I700 Atherosclerosis of aorta: Secondary | ICD-10-CM | POA: Insufficient documentation

## 2022-12-07 DIAGNOSIS — C3490 Malignant neoplasm of unspecified part of unspecified bronchus or lung: Secondary | ICD-10-CM | POA: Diagnosis not present

## 2022-12-07 DIAGNOSIS — I714 Abdominal aortic aneurysm, without rupture, unspecified: Secondary | ICD-10-CM | POA: Insufficient documentation

## 2022-12-07 DIAGNOSIS — Z79899 Other long term (current) drug therapy: Secondary | ICD-10-CM | POA: Diagnosis not present

## 2022-12-07 DIAGNOSIS — M549 Dorsalgia, unspecified: Secondary | ICD-10-CM | POA: Insufficient documentation

## 2022-12-07 DIAGNOSIS — Z7984 Long term (current) use of oral hypoglycemic drugs: Secondary | ICD-10-CM | POA: Diagnosis not present

## 2022-12-07 DIAGNOSIS — J432 Centrilobular emphysema: Secondary | ICD-10-CM | POA: Diagnosis not present

## 2022-12-07 DIAGNOSIS — N189 Chronic kidney disease, unspecified: Secondary | ICD-10-CM | POA: Insufficient documentation

## 2022-12-07 DIAGNOSIS — I10 Essential (primary) hypertension: Secondary | ICD-10-CM | POA: Diagnosis not present

## 2022-12-07 DIAGNOSIS — E7800 Pure hypercholesterolemia, unspecified: Secondary | ICD-10-CM | POA: Diagnosis not present

## 2022-12-07 DIAGNOSIS — R634 Abnormal weight loss: Secondary | ICD-10-CM | POA: Insufficient documentation

## 2022-12-07 DIAGNOSIS — Z8616 Personal history of COVID-19: Secondary | ICD-10-CM | POA: Insufficient documentation

## 2022-12-07 DIAGNOSIS — C342 Malignant neoplasm of middle lobe, bronchus or lung: Secondary | ICD-10-CM | POA: Diagnosis present

## 2022-12-07 NOTE — Progress Notes (Signed)
[facility] [phone]   [phone]  OFFICE PROGRESS NOTE  Carmel Sacramento, NP [address] [place] [address]  DIAGNOSIS: Recurrent non-small cell lung cancer, squamous cell carcinoma presented with right lower lobe lung nodule in August 2021 initially diagnosed as stage IIB (T3, N0, M0) non-small cell lung cancer, invasive well-differentiated squamous cell carcinoma presented with right middle lobe lung mass in May 2019 .  PRIOR THERAPY:  1) Status post right VATS, right middle lobectomy with mediastinal lymph node sampling under the care of Dr. Dorris Fetch on 09/19/2017.  The tumor measured 4.2 cm but the carcinoma extends through the visceral pleura. 2) Adjuvant systemic chemotherapy with carboplatin for AUC of 6 and paclitaxel 200 mg/M2 every 3 weeks.  First dose 11/16/2017.  Status post 3 cycles.  Last dose was giving January 05, 2018 discontinued secondary to intolerance with significant peripheral neuropathy. 3) curative radiotherapy to the right lower lobe pulmonary nodule under the care of Dr. Kathrynn Running completed on December 21, 2019.  CURRENT THERAPY: Observation.  INTERVAL HISTORY: Danny Duke 87 y.o. male returns to the clinic today for follow-up visit.Discussed the use of AI scribe software for clinical note transcription with the patient, who gave verbal consent to proceed.  History of Present Illness   Danny Duke, an 87 year old African American individual, was diagnosed with stage 2B non-small cell lung cancer, specifically squamous cell carcinoma, in May 2019. He underwent a right middle lobectomy and received three treatments. Recently, he has experienced a couple of falls and contracted COVID-19 in July of 2024. A CT angiogram of the chest was performed in July, which did not reveal any blood clots or other abnormalities.  He reports unintentional weight loss over the past five to six months, dropping from 165 pounds to  around 160 pounds. This weight loss is attributed to a decreased appetite and changes in eating habits due to his spouse's recent surgery.  However, due to the patient's cancer history, a follow-up scan in three months was recommended to rule out cancer recurrence.       MEDICAL HISTORY: Past Medical History:  Diagnosis Date   AAA (abdominal aortic aneurysm) (HCC)    Chronic back pain    Chronic kidney disease    Hypercholesterolemia    Hypertension    Right Lung dx'd 09/2017   s/p RMLobectomy 2019 and adjuvant chemotherapy and SBRT    ALLERGIES:  has No Known Allergies.  MEDICATIONS:  Current Outpatient Medications  Medication Sig Dispense Refill   acetaminophen (TYLENOL) 500 MG tablet Take 1,000 mg by mouth every 6 (six) hours as needed for moderate pain or headache.     allopurinol (ZYLOPRIM) 100 MG tablet Take 100 mg by mouth daily.     amLODipine (NORVASC) 10 MG tablet Take 10 mg by mouth daily.     amoxicillin-clavulanate (AUGMENTIN) 875-125 MG tablet Take 1 tablet by mouth every 12 (twelve) hours. 14 tablet 0   aspirin EC 81 MG tablet Take 81 mg by mouth daily.     benzonatate (TESSALON) 100 MG capsule Take 1 capsule (100 mg total) by mouth every 8 (eight) hours as needed for cough. 10 capsule 0   DULoxetine (CYMBALTA) 60 MG capsule TAKE 1 CAPSULE(60 MG) BY MOUTH DAILY 30 capsule 3   FARXIGA 10 MG TABS tablet Take 10 mg by mouth daily.     gabapentin (NEURONTIN) 300 MG capsule TAKE 3 CAPSULES(900 MG) BY MOUTH THREE TIMES DAILY 120 capsule 6  lidocaine (LIDODERM) 5 % Place 1 patch onto the skin daily. Remove & Discard patch within 12 hours or as directed by MD 30 patch 0   metoprolol succinate (TOPROL-XL) 25 MG 24 hr tablet Take 1 tablet (25 mg total) by mouth daily. 60 tablet 1   oxyCODONE (ROXICODONE) 5 MG immediate release tablet Take 1 tablet (5 mg total) by mouth every 8 (eight) hours as needed for up to 12 doses for breakthrough pain. 10 tablet 0   simvastatin (ZOCOR)  20 MG tablet Take 20 mg by mouth daily.     Vitamin D, Ergocalciferol, (DRISDOL) 1.25 MG (50000 UNIT) CAPS capsule Take 50,000 Units by mouth once a week.     No current facility-administered medications for this visit.    SURGICAL HISTORY:  Past Surgical History:  Procedure Laterality Date   ABDOMINAL AORTIC ENDOVASCULAR STENT GRAFT Right 02/03/2022   Procedure: ABDOMINAL AORTIC ENDOVASCULAR STENT GRAFT;  Surgeon: Nada Libman, MD;  Location: [facility] OR;  Service: Vascular;  Laterality: Right;   BRONCHIAL NEEDLE ASPIRATION BIOPSY  08/05/2017   Procedure: BRONCHIAL NEEDLE ASPIRATION BIOPSIES;  Surgeon: Lupita Leash, MD;  Location: [facility];  Service: Cardiopulmonary;;   ENDOBRONCHIAL ULTRASOUND Bilateral 08/05/2017   Procedure: ENDOBRONCHIAL ULTRASOUND;  Surgeon: Lupita Leash, MD;  Location: [facility];  Service: Cardiopulmonary;  Laterality: Bilateral;   IR IMAGING GUIDED PORT INSERTION  12/27/2017   IR REMOVAL TUN ACCESS W/ PORT W/O FL MOD SED  03/25/2020   MEDIASTINOSCOPY N/A 09/19/2017   Procedure: MEDIASTINOSCOPY;  Surgeon: Loreli Slot, MD;  Location: [facility] OR;  Service: Thoracic;  Laterality: N/A;   MULTIPLE TOOTH EXTRACTIONS     VIDEO ASSISTED THORACOSCOPY (VATS)/ LOBECTOMY Right 09/19/2017   Procedure: RIGHT VIDEO ASSISTED THORACOSCOPY (VATS)/ LOBECTOMY;  Surgeon: Loreli Slot, MD;  Location: [facility] OR;  Service: Thoracic;  Laterality: Right;    REVIEW OF SYSTEMS:  A comprehensive review of systems was negative except for: Constitutional: positive for fatigue and weight loss   PHYSICAL EXAMINATION: General appearance: alert, cooperative, fatigued, and no distress Head: Normocephalic, without obvious abnormality, atraumatic Neck: no adenopathy, no JVD, supple, symmetrical, trachea midline, and thyroid not enlarged, symmetric, no tenderness/mass/nodules Lymph nodes: Cervical, supraclavicular, and axillary nodes normal. Resp: clear to auscultation  bilaterally Back: symmetric, no curvature. ROM normal. No CVA tenderness. Cardio: regular rate and rhythm, S1, S2 normal, no murmur, click, rub or gallop GI: soft, non-tender; bowel sounds normal; no masses,  no organomegaly Extremities: extremities normal, atraumatic, no cyanosis or edema  ECOG PERFORMANCE STATUS: 1 - Symptomatic but completely ambulatory  Blood pressure 138/81, pulse 92, temperature 97.9 F (36.6 C), temperature source Oral, resp. rate 17, height 5\' 8"  (1.727 m), weight 160 lb 14.4 oz (73 kg), SpO2 98%.  LABORATORY DATA: Lab Results  Component Value Date   WBC 4.4 11/30/2022   HGB 12.5 (L) 11/30/2022   HCT 38.9 (L) 11/30/2022   MCV 95.6 11/30/2022   PLT 155 11/30/2022      Chemistry      Component Value Date/Time   NA 140 11/30/2022 1005   K 4.3 11/30/2022 1005   CL 109 11/30/2022 1005   CO2 28 11/30/2022 1005   BUN 10 11/30/2022 1005   CREATININE 1.23 11/30/2022 1005      Component Value Date/Time   CALCIUM 9.5 11/30/2022 1005   ALKPHOS 74 11/30/2022 1005   AST 19 11/30/2022 1005   ALT 10 11/30/2022 1005   BILITOT 0.5 11/30/2022 1005  RADIOGRAPHIC STUDIES: CT Chest Wo Contrast  Result Date: 12/07/2022 CLINICAL DATA:  Staging non-small cell lung cancer. * Tracking Code: BO * EXAM: CT CHEST WITHOUT CONTRAST TECHNIQUE: Multidetector CT imaging of the chest was performed following the standard protocol without IV contrast. RADIATION DOSE REDUCTION: This exam was performed according to the departmental dose-optimization program which includes automated exposure control, adjustment of the mA and/or kV according to patient size and/or use of iterative reconstruction technique. COMPARISON:  Chest CT 10/04/2022 and 05/31/2022. FINDINGS: Cardiovascular: Atherosclerosis of the aorta, great vessels and coronary arteries. The heart size is normal. There is no pericardial effusion. Mediastinum/Nodes: Multiple calcified mediastinal and hilar lymph nodes are again  noted bilaterally. A noncalcified right paratracheal node measuring 1.4 cm short axis on image 43/2 is stable. No enlarging mediastinal, hilar or axillary lymph nodes identified on noncontrast imaging. The thyroid gland, trachea and esophagus demonstrate no significant findings. Lungs/Pleura: No pleural effusion or pneumothorax. Stable mild pleural thickening and calcifications on the right. Moderate to severe centrilobular emphysema with chronic biapical scarring and sequela of previous right middle lobectomy. There is interval increased ill-defined density medially in the right upper lobe measuring up to 2.9 x 1.7 cm on image 31/5. This is associated with adjacent dilated bronchi and an adjacent paraseptal bleb. Band like opacity posteriorly in the right lower lobe is stable, are stable likely related to prior radiation therapy. No significant changes are identified within the left lung. There are other stable scattered calcified and noncalcified pulmonary nodules bilaterally. Upper abdomen: The visualized upper abdomen appears stable, without significant findings. There are scattered small cystic lesions in the liver and left kidney for which no specific follow-up imaging is recommended. Musculoskeletal/Chest wall: There is a mildly displaced fracture of the right 7th rib posteriorly which is nonacute, although slightly more displaced than on the most recent CT. This is adjacent to the radiation changes and likely represents an insufficiency fracture. No new fractures or osseous metastases identified. Mild degenerative changes in the spine. Unless specific follow-up recommendations are mentioned in the findings or impression sections, no imaging follow-up of any mentioned incidental findings is recommended. IMPRESSION: 1. Nonspecific interval increased ill-defined density medially in the right upper lobe, possibly inflammatory/postinflammatory. Morphologically, this does not favor local recurrence or metastatic  disease. Short-term follow-up CT in 3 months suggested for further assessment. If clinically warranted, follow-up PET-CT could be performed. 2. Otherwise stable appearance of the lungs status post right middle lobectomy and radiation therapy. There is multifocal pleuroparenchymal scarring, in part secondary to prior granulomatous disease. 3. Stable calcified mediastinal and hilar lymph nodes consistent with prior granulomatous disease. 4. Mildly displaced fracture of the right 7th rib posteriorly, likely an insufficiency fracture. No new fractures or osseous metastases identified. 5. Aortic Atherosclerosis (ICD10-I70.0) and Emphysema (ICD10-J43.9). Electronically Signed   By: Carey Bullocks M.D.   On: 12/07/2022 09:28     ASSESSMENT AND PLAN: This is a very pleasant 87 years old African-American male with likely recurrent lung cancer initially diagnosed as stage IIB (T3, N0, M0) invasive well-differentiated squamous cell carcinoma presented with right middle lobe lung mass status post right middle lobectomy with lymph node sampling on September 19, 2017 under the care of Dr. Dorris Fetch. The patient underwent adjuvant treatment with systemic chemotherapy with carboplatin for AUC of 6 and paclitaxel 200 mg/M2 every 3 weeks with Neulasta support status post 3 cycles. He tolerated this treatment well except for the chemotherapy-induced peripheral neuropathy and he discontinued his treatment after cycle #3.  He underwent CT-guided core biopsy of the enlarging right lower lobe lung nodule and the final pathology was consistent with squamous cell carcinoma. He underwent SBRT to the right lower lobe lung nodule by Dr. Kathrynn Running and tolerated the procedure fairly well. He is currently on observation. He had repeat CT scan of the chest performed recently.  I personally and independently reviewed the scan and discussed the result with the patient today. His scan showed no concerning findings for disease recurrence or  metastasis but there was right upper lobe density suspicious for inflammatory/postinflammatory changes and the recommendation is to repeat CT scan of the chest in 3 months for further evaluation.    Stage 2B Non-Small Cell Lung Cancer (Squamous Cell Carcinoma) Status post right middle lobectomy in May 2019. Recent CT scan on 11/30/2022 showed an area of density in the right upper lobe, likely inflammation. No current symptoms. -Schedule follow-up CT scan in 3 months to ensure no recurrence of cancer. -Return for follow-up appointment in 3 months to review scan results.  Unintentional Weight Loss Lost approximately 4 pounds since March 2024, likely due to decreased appetite and changes in eating habits related to wife's surgery. -Monitor weight and eating habits. -Encourage regular meals and balanced diet.  History of COVID-19 Recovered from COVID-19 in September 2024. No current symptoms. -Continue routine follow-up and monitoring.   The patient was advised to call immediately if he has any concerning symptoms in the interval. The patient voices understanding of current disease status and treatment options and is in agreement with the current care plan.  All questions were answered. The patient knows to call the clinic with any problems, questions or concerns. We can certainly see the patient much sooner if necessary.  Disclaimer: This note was dictated with voice recognition software. Similar sounding words can inadvertently be transcribed and may not be corrected upon review.

## 2023-02-02 ENCOUNTER — Encounter: Payer: Self-pay | Admitting: Internal Medicine

## 2023-02-13 ENCOUNTER — Other Ambulatory Visit: Payer: Self-pay | Admitting: Internal Medicine

## 2023-02-14 ENCOUNTER — Encounter: Payer: Self-pay | Admitting: Internal Medicine

## 2023-02-25 ENCOUNTER — Encounter: Payer: Self-pay | Admitting: Internal Medicine

## 2023-03-04 ENCOUNTER — Ambulatory Visit (HOSPITAL_COMMUNITY)
Admission: RE | Admit: 2023-03-04 | Discharge: 2023-03-04 | Disposition: A | Discharge status: Home / Self Care | Payer: Medicare HMO | Source: Ambulatory Visit | Attending: Internal Medicine | Admitting: Internal Medicine

## 2023-03-04 ENCOUNTER — Inpatient Hospital Stay: Payer: Medicare HMO | Attending: Internal Medicine

## 2023-03-04 DIAGNOSIS — C3490 Malignant neoplasm of unspecified part of unspecified bronchus or lung: Secondary | ICD-10-CM | POA: Diagnosis present

## 2023-03-04 LAB — CMP (CANCER CENTER ONLY)
ALT: 9 U/L (ref 0–44)
AST: 19 U/L (ref 15–41)
Albumin: 3.8 g/dL (ref 3.5–5.0)
Alkaline Phosphatase: 69 U/L (ref 38–126)
Anion gap: 4 — ABNORMAL LOW (ref 5–15)
BUN: 12 mg/dL (ref 8–23)
CO2: 28 mmol/L (ref 22–32)
Calcium: 9.6 mg/dL (ref 8.9–10.3)
Chloride: 108 mmol/L (ref 98–111)
Creatinine: 1.25 mg/dL — ABNORMAL HIGH (ref 0.61–1.24)
GFR, Estimated: 56 mL/min — ABNORMAL LOW (ref >60)
Glucose, Bld: 113 mg/dL — ABNORMAL HIGH (ref 70–99)
Potassium: 3.7 mmol/L (ref 3.5–5.1)
Sodium: 140 mmol/L (ref 135–145)
Total Bilirubin: 0.4 mg/dL (ref <1.2)
Total Protein: 7.7 g/dL (ref 6.5–8.1)

## 2023-03-04 LAB — CBC WITH DIFFERENTIAL (CANCER CENTER ONLY)
Abs Immature Granulocytes: 0.02 10*3/uL (ref 0.00–0.07)
Basophils Absolute: 0 10*3/uL (ref 0.0–0.1)
Basophils Relative: 0 %
Eosinophils Absolute: 0.2 10*3/uL (ref 0.0–0.5)
Eosinophils Relative: 5 %
HCT: 40 % (ref 39.0–52.0)
Hemoglobin: 13.1 g/dL (ref 13.0–17.0)
Immature Granulocytes: 0 %
Lymphocytes Relative: 32 %
Lymphs Abs: 1.6 10*3/uL (ref 0.7–4.0)
MCH: 30.5 pg (ref 26.0–34.0)
MCHC: 32.8 g/dL (ref 30.0–36.0)
MCV: 93 fL (ref 80.0–100.0)
Monocytes Absolute: 0.6 10*3/uL (ref 0.1–1.0)
Monocytes Relative: 12 %
Neutro Abs: 2.6 10*3/uL (ref 1.7–7.7)
Neutrophils Relative %: 51 %
Platelet Count: 121 10*3/uL — ABNORMAL LOW (ref 150–400)
RBC: 4.3 MIL/uL (ref 4.22–5.81)
RDW: 13.7 % (ref 11.5–15.5)
WBC Count: 5.1 10*3/uL (ref 4.0–10.5)
nRBC: 0 % (ref 0.0–0.2)

## 2023-03-12 ENCOUNTER — Encounter: Payer: Self-pay | Admitting: Internal Medicine

## 2023-03-13 ENCOUNTER — Other Ambulatory Visit: Payer: Self-pay

## 2023-03-13 ENCOUNTER — Emergency Department (HOSPITAL_COMMUNITY): Payer: No Typology Code available for payment source

## 2023-03-13 ENCOUNTER — Emergency Department (HOSPITAL_COMMUNITY)
Admission: EM | Admit: 2023-03-13 | Discharge: 2023-03-13 | Disposition: A | Discharge status: Home / Self Care | Payer: Medicare (Managed Care) | Attending: Emergency Medicine | Admitting: Emergency Medicine

## 2023-03-13 ENCOUNTER — Emergency Department (HOSPITAL_COMMUNITY): Payer: Medicare (Managed Care)

## 2023-03-13 DIAGNOSIS — W19XXXA Unspecified fall, initial encounter: Secondary | ICD-10-CM | POA: Diagnosis not present

## 2023-03-13 DIAGNOSIS — J449 Chronic obstructive pulmonary disease, unspecified: Secondary | ICD-10-CM | POA: Diagnosis not present

## 2023-03-13 DIAGNOSIS — Z85118 Personal history of other malignant neoplasm of bronchus and lung: Secondary | ICD-10-CM | POA: Diagnosis not present

## 2023-03-13 DIAGNOSIS — Z7982 Long term (current) use of aspirin: Secondary | ICD-10-CM | POA: Diagnosis not present

## 2023-03-13 DIAGNOSIS — R0602 Shortness of breath: Secondary | ICD-10-CM | POA: Insufficient documentation

## 2023-03-13 DIAGNOSIS — J189 Pneumonia, unspecified organism: Secondary | ICD-10-CM

## 2023-03-13 DIAGNOSIS — J168 Pneumonia due to other specified infectious organisms: Secondary | ICD-10-CM | POA: Diagnosis not present

## 2023-03-13 DIAGNOSIS — S299XXA Unspecified injury of thorax, initial encounter: Secondary | ICD-10-CM | POA: Diagnosis present

## 2023-03-13 DIAGNOSIS — S2231XA Fracture of one rib, right side, initial encounter for closed fracture: Secondary | ICD-10-CM | POA: Diagnosis not present

## 2023-03-13 LAB — COMPREHENSIVE METABOLIC PANEL
ALT: 13 U/L (ref 0–44)
AST: 24 U/L (ref 15–41)
Albumin: 3.3 g/dL — ABNORMAL LOW (ref 3.5–5.0)
Alkaline Phosphatase: 66 U/L (ref 38–126)
Anion gap: 7 (ref 5–15)
BUN: 8 mg/dL (ref 8–23)
CO2: 25 mmol/L (ref 22–32)
Calcium: 9.7 mg/dL (ref 8.9–10.3)
Chloride: 107 mmol/L (ref 98–111)
Creatinine, Ser: 1.04 mg/dL (ref 0.61–1.24)
GFR, Estimated: >60 mL/min (ref >60)
Glucose, Bld: 113 mg/dL — ABNORMAL HIGH (ref 70–99)
Potassium: 3.7 mmol/L (ref 3.5–5.1)
Sodium: 139 mmol/L (ref 135–145)
Total Bilirubin: 0.7 mg/dL (ref 0.0–1.2)
Total Protein: 7.4 g/dL (ref 6.5–8.1)

## 2023-03-13 LAB — CBC WITH DIFFERENTIAL/PLATELET
Abs Immature Granulocytes: 0.02 10*3/uL (ref 0.00–0.07)
Basophils Absolute: 0 10*3/uL (ref 0.0–0.1)
Basophils Relative: 0 %
Eosinophils Absolute: 0.1 10*3/uL (ref 0.0–0.5)
Eosinophils Relative: 2 %
HCT: 40 % (ref 39.0–52.0)
Hemoglobin: 13.1 g/dL (ref 13.0–17.0)
Immature Granulocytes: 0 %
Lymphocytes Relative: 35 %
Lymphs Abs: 1.8 10*3/uL (ref 0.7–4.0)
MCH: 30.9 pg (ref 26.0–34.0)
MCHC: 32.8 g/dL (ref 30.0–36.0)
MCV: 94.3 fL (ref 80.0–100.0)
Monocytes Absolute: 0.6 10*3/uL (ref 0.1–1.0)
Monocytes Relative: 12 %
Neutro Abs: 2.5 10*3/uL (ref 1.7–7.7)
Neutrophils Relative %: 51 %
Platelets: 125 10*3/uL — ABNORMAL LOW (ref 150–400)
RBC: 4.24 MIL/uL (ref 4.22–5.81)
RDW: 13.8 % (ref 11.5–15.5)
WBC: 5 10*3/uL (ref 4.0–10.5)
nRBC: 0 % (ref 0.0–0.2)

## 2023-03-13 LAB — BRAIN NATRIURETIC PEPTIDE: B Natriuretic Peptide: 31.2 pg/mL (ref 0.0–100.0)

## 2023-03-13 LAB — TROPONIN I (HIGH SENSITIVITY)
Troponin I (High Sensitivity): 4 ng/L (ref <18)
Troponin I (High Sensitivity): 4 ng/L (ref <18)

## 2023-03-13 MED ORDER — IOHEXOL 350 MG/ML SOLN
75.0000 mL | Freq: Once | INTRAVENOUS | Status: AC | PRN
  Administered 2023-03-13: 75 mL via INTRAVENOUS

## 2023-03-13 MED ORDER — DOXYCYCLINE HYCLATE 100 MG PO CAPS: 100.0000 mg | ORAL_CAPSULE | Freq: Two times a day (BID) | ORAL | 0 refills | Status: DC

## 2023-03-13 MED ORDER — ALBUTEROL SULFATE HFA 108 (90 BASE) MCG/ACT IN AERS: 2.0000 | INHALATION_SPRAY | RESPIRATORY_TRACT | Status: DC | PRN

## 2023-03-13 MED ORDER — OXYCODONE HCL 5 MG PO TABS
5.0000 mg | ORAL_TABLET | ORAL | Status: AC
  Administered 2023-03-13: 5 mg via ORAL
  Filled 2023-03-13: qty 1

## 2023-03-13 NOTE — ED Notes (Addendum)
 Pt ambulated 200 ft. Pulse ox remained > 98% on room air while ambulating.

## 2023-03-13 NOTE — ED Triage Notes (Signed)
 Pt reports that approximately three weeks ago he was standing on a generator and fell, landing on his R side. Since then pt has been having R rib cage pain and SOB. Pt denies LOC.

## 2023-03-13 NOTE — Discharge Instructions (Addendum)
 You were seen today for evaluation of your rib pain.  It shows that you have a mildly displaced rib fracture.  You could have reinjured your rib or still having pain from the fracture.  It also shows some questionable infection as well.  Because of this, I am going to start you on antibiotic called doxycycline .  Take this twice a day for the next week.  Additionally, please take your prescribed narcotics as needed.  You can also try topical lidocaine  patches to the area as well.  You will need to do the incentive spirometry as previously discussed.  I included more information on this into the discharge paperwork for you to read.  Please make sure you are using this every hour while awake.  Please make sure you follow with your primary care doctor as they will need to do repeat imaging to make sure that this is resolved.  If you have any worsening pain, fever, cough, coughing up blood, worsening pain, chest pain, worsening shortness of breath, please return to the nearest emergency department for reevaluation.  If you have any concerns, new or worsening symptoms, please return to your nearest emerged department for evaluation.  Contact a doctor if: You have a fever. You lose sleep because your cough medicine does not help. Get help right away if: You are short of breath and this gets worse. You have more chest pain. Your sickness gets worse. This is very serious if: You are an older adult. Your body's defense system is weak. You cough up blood. These symptoms may be an emergency. Get help right away. Call 911. Do not wait to see if the symptoms will go away. Do not drive yourself to the hospital.

## 2023-03-13 NOTE — ED Provider Notes (Signed)
 [facility] Provider Note   CSN: [id number] Arrival date & time: 03/13/23  1041     History Chief Complaint  Patient presents with   Rib Injury   Shortness of Breath    Danny Duke is a 88 y.o. male with history of COPD, lung cancer in remission, lobectomy presents to the emerged from today for evaluation of right sided rib pain as well as some shortness of breath.  Patient reports that 3 weeks ago he fell off a generator and lead on his right ribs.  He reports he has been having pain to the area since then.  He reports he did have an episode of shortness of breath when he walked to the car and back he felt some dyspnea on exertion.  He reports that he has been walking to the car to go to the ER and is walked and has not had any shortness of breath since.  Denies any chest pain or any palpitations.  He is currently on pain management.  Denies any recent cough or cold symptoms.  Denies any fever or any hemoptysis. He currently has no complaints. He declines pain medication.    Shortness of Breath Associated symptoms: chest pain   Associated symptoms: no abdominal pain, no fever and no vomiting        Home Medications Prior to Admission medications   Medication Sig Start Date End Date Taking? Authorizing Provider  acetaminophen  (TYLENOL ) 500 MG tablet Take 1,000 mg by mouth every 6 (six) hours as needed for moderate pain or headache.   Yes [provider]  allopurinol  (ZYLOPRIM ) 100 MG tablet Take 100 mg by mouth daily. 10/23/21  Yes [provider]  amLODipine  (NORVASC ) 10 MG tablet Take 10 mg by mouth daily.   Yes [provider]  aspirin  EC 81 MG tablet Take 81 mg by mouth daily.   Yes [provider]  doxycycline  (VIBRAMYCIN ) 100 MG capsule Take 1 capsule (100 mg total) by mouth 2 (two) times daily. 03/13/23  Yes Bernis Ernst, PA-C  DULoxetine  (CYMBALTA ) 60 MG capsule TAKE 1 CAPSULE(60 MG) BY MOUTH  DAILY Patient taking differently: Take 60 mg by mouth in the morning. 02/14/23  Yes Vaslow, Zachary K, MD  FARXIGA  10 MG TABS tablet Take 10 mg by mouth daily. 01/19/22  Yes [provider]  gabapentin  (NEURONTIN ) 300 MG capsule TAKE 3 CAPSULES(900 MG) BY MOUTH THREE TIMES DAILY Patient taking differently: Take 900 mg by mouth 3 (three) times daily. 10/06/20  Yes Vaslow, Zachary K, MD  metoprolol  succinate (TOPROL -XL) 25 MG 24 hr tablet Take 1 tablet (25 mg total) by mouth daily. 09/26/17  Yes Gold, Wayne E, PA-C  omeprazole (PRILOSEC) 40 MG capsule Take 40 mg by mouth daily before breakfast.   Yes [provider]  oxyCODONE  (ROXICODONE ) 5 MG immediate release tablet Take 1 tablet (5 mg total) by mouth every 8 (eight) hours as needed for up to 12 doses for breakthrough pain. 10/04/22  Yes Curatolo, Adam, DO  simvastatin  (ZOCOR ) 20 MG tablet Take 20 mg by mouth daily.   Yes [provider]  benzonatate  (TESSALON ) 100 MG capsule Take 1 capsule (100 mg total) by mouth every 8 (eight) hours as needed for cough. Patient not taking: Reported on 03/13/2023 09/24/22   Bernis Ernst, PA-C  lidocaine  (LIDODERM ) 5 % Place 1 patch onto the skin daily. Remove & Discard patch within 12 hours or as directed by MD Patient not  taking: Reported on 03/13/2023 10/04/22   Ruthe Cornet, DO      Allergies    Patient has no known allergies.    Review of Systems   Review of Systems  Constitutional:  Negative for chills and fever.  Respiratory:  Positive for shortness of breath.   Cardiovascular:  Positive for chest pain.  Gastrointestinal:  Negative for abdominal pain, nausea and vomiting.    Physical Exam Updated Vital Signs BP 135/77 (BP Location: Right Arm)   Pulse 87   Temp (!) 97.3 F (36.3 C) (Oral)   Resp 15   SpO2 100%  Physical Exam Vitals and nursing note reviewed.  Constitutional:      General: He is not in acute distress.    Appearance: He is not ill-appearing or  toxic-appearing.  Cardiovascular:     Rate and Rhythm: Normal rate.  Pulmonary:     Effort: Pulmonary effort is normal. No tachypnea.     Breath sounds: Normal breath sounds.  Chest:     Chest wall: No tenderness.     Comments: Faint scarring present to the lateral right chest.  No step-offs or deformities.  No overlying skin changes or rash.  No signs of trauma. Skin:    General: Skin is warm and dry.  Neurological:     Mental Status: He is alert.     ED Results / Procedures / Treatments   Labs (all labs ordered are listed, but only abnormal results are displayed) Labs Reviewed  CBC WITH DIFFERENTIAL/PLATELET - Abnormal; Notable for the following components:      Result Value   Platelets 125 (*)    All other components within normal limits  COMPREHENSIVE METABOLIC PANEL - Abnormal; Notable for the following components:   Glucose, Bld 113 (*)    Albumin  3.3 (*)    All other components within normal limits  BRAIN NATRIURETIC PEPTIDE  TROPONIN I (HIGH SENSITIVITY)  TROPONIN I (HIGH SENSITIVITY)    EKG EKG Interpretation Date/Time:  Sunday March 13 2023 10:52:05 EST Ventricular Rate:  99 PR Interval:  140 QRS Duration:  72 QT Interval:  302 QTC Calculation: 387 R Axis:   27  Text Interpretation: Normal sinus rhythm Moderate voltage criteria for LVH, may be normal variant ( R in aVL , Sokolow-Lyon ) Nonspecific T wave abnormality Abnormal ECG When compared with ECG of 04-Oct-2022 13:02, PREVIOUS ECG IS PRESENT when compared to prior, faster rate and more artifact. No STEMI Confirmed by Ginger Barefoot ([id number]) on 03/13/2023 3:20:48 PM  Radiology CT Angio Chest PE W and/or Wo Contrast Result Date: 03/13/2023 CLINICAL DATA:  PE suspected. Fall from 3 feet 2 weeks ago landing on back. Now has right anterior flank and rib pain. Shortness of breath. EXAM: CT ANGIOGRAPHY CHEST WITH CONTRAST TECHNIQUE: Multidetector CT imaging of the chest was performed using the standard protocol  during bolus administration of intravenous contrast. Multiplanar CT image reconstructions and MIPs were obtained to evaluate the vascular anatomy. RADIATION DOSE REDUCTION: This exam was performed according to the departmental dose-optimization program which includes automated exposure control, adjustment of the mA and/or kV according to patient size and/or use of iterative reconstruction technique. CONTRAST:  75mL OMNIPAQUE  IOHEXOL  350 MG/ML SOLN COMPARISON:  CT 11/30/2022 and 03/04/2023 FINDINGS: Cardiovascular: Negative for acute pulmonary embolism. No pericardial effusion. Coronary artery and aortic atherosclerotic calcification. Mediastinum/Nodes: Trachea is unremarkable. Fluid and debris layering in the esophagus. Multiple calcified hilar and mediastinal lymph nodes. 13 mm right paratracheal node (7/98) is unchanged.  Lungs/Pleura: Advanced emphysema. Biapical pleural-parenchymal scarring. Bullous change in the right lower lobe. Postoperative change of right middle lobectomy. Ground-glass opacities and interlobular septal thickening is new compared to 03/04/2023. Diffuse bronchial wall thickening. Unchanged radiation fibrosis along the posterior right lower lobe. 4 mm nodule in the lingula on series 7/image 154 is stable dating back to 2022. No pleural effusion or pneumothorax. Upper Abdomen: No acute abnormality. Musculoskeletal: Similar mildly displaced fracture of the posterior right seventh rib adjacent to the radiation changes compatible with insufficiency fracture. No acute fracture. Review of the MIP images confirms the above findings. IMPRESSION: 1. Negative for acute pulmonary embolism. 2. Findings favor superimposed infection or edema on a background of advanced emphysema. 3. Similar mildly displaced fracture of the posterior right seventh rib adjacent to the radiation changes compatible with insufficiency fracture. 4. Fluid and debris layering in the esophagus compatible with reflux Aortic  Atherosclerosis (ICD10-I70.0) and Emphysema (ICD10-J43.9). Electronically Signed   By: Norman Gatlin M.D.   On: 03/13/2023 20:03   DG Ribs Unilateral Right Result Date: 03/13/2023 CLINICAL DATA:  Fall from 3 feet 2 weeks ago landing on back. Now has right anterior and flank rib pain. EXAM: RIGHT RIBS - 2 VIEW COMPARISON:  CT 03/04/2023. FINDINGS: No displaced rib fractures identified. Chronic lung disease noted within the imaged portions of the lungs with postoperative change noted in the right midlung. IMPRESSION: 1. No displaced rib fractures identified. 2. Chronic lung disease. Electronically Signed   By: Waddell Calk M.D.   On: 03/13/2023 13:32   DG Chest 2 View Result Date: 03/13/2023 CLINICAL DATA:  Shortness of breath EXAM: CHEST - 2 VIEW COMPARISON:  09/24/2022 FINDINGS: Chronic lung disease with biapical architectural distortion. Band of chain sutures and scarring at the right mid lung with costophrenic sulcus blunting on the right, history of treated lung cancer. Normal heart size. Scarring causes distortion of mediastinal contours which is stable. There is no edema, consolidation, effusion, or pneumothorax. IMPRESSION: Mix of chronic lung disease, post treatment change, and emphysema. No acute superimposed finding. Electronically Signed   By: Dorn Roulette M.D.   On: 03/13/2023 11:29    Procedures Procedures   Medications Ordered in ED Medications  albuterol  (VENTOLIN  HFA) 108 (90 Base) MCG/ACT inhaler 2 puff (has no administration in time range)  iohexol  (OMNIPAQUE ) 350 MG/ML injection 75 mL (75 mLs Intravenous Contrast Given 03/13/23 1937)  oxyCODONE  (Oxy IR/ROXICODONE ) immediate release tablet 5 mg (5 mg Oral Given 03/13/23 2026)    ED Course/ Medical Decision Making/ A&P                                Medical Decision Making Amount and/or Complexity of Data Reviewed Labs: ordered. Radiology: ordered.  Risk Prescription drug management.   88 y.o. male presents to the ER  for evaluation of right-sided chest pain momentary shortness of breath. Differential diagnosis includes but is not limited to CHF, pericardial effusion/tamponade, arrhythmias, ACS, COPD, asthma, bronchitis, pneumonia, pneumothorax, PE, anemia, rib fracture, trauma. Vital signs temperature 97.3, otherwise unremarkable. Physical exam as noted above.   Chest x-ray ordered in triage show given patient's age and comorbidities, I will increase this to a CT angio.  Also labs ordered as well given the separate episode of dyspnea that only happened today.  Offered patient the medication however he declined at this time.  I independently reviewed and interpreted the patient's labs.  BMP within normal limits.  Troponins flat at 4.  CMP shows mildly elevated Leukos at 113 albumin  at 3.3 otherwise no electrolyte or LFT abnormality.  CBC with a leukocytosis or anemia.  Platelet 125. Appears to be chronic for patient.  CXR Mix of chronic lung disease, post treatment change, and emphysema. No acute superimposed finding. 1. No displaced rib fractures identified. 2. Chronic lung disease. Per radiologist's interpretation.     CT Angio shows : 1. Negative for acute pulmonary embolism. 2. Findings favor superimposed infection or edema on a background of advanced emphysema. 3. Similar mildly displaced fracture of the posterior right seventh rib adjacent to the radiation changes compatible with insufficiency fracture. 4. Fluid and debris layering in the esophagus compatible with reflux Aortic Atherosclerosis (ICD10-I70.0) and Emphysema   EKG reviewed and interpreted by my attending and read as Normal sinus rhythm Moderate voltage criteria for LVH, may be normal variant ( R in aVL , Sokolow-Lyon ) Nonspecific T wave abnormality Abnormal ECG When compared with ECG of 04-Oct-2022 13:02, PREVIOUS ECG IS PRESENT when compared to prior, faster rate and more artifact.  Patient may be having some developing pneumonia from right rib  fracture and not taking deep inspiratory breathing.  Question if this patient reinjured the seventh rib and may have displaced it again which is what is causing him pain or just may be having delayed healing.  Will come any fall with primary care doctor about this.  I given him an inspiratory spirometer to take home.  Discussed my attending, will place him on doxycycline .  He is ambulatory pulse oximetry was above 98% on room air did not feel short of breath with ambulation.  His vital signs are stable here.  Patient is already on pain contract. Discussed that he can use OTC lidocaine  patches. He can contact his PM provider if he needs his pain medications increased.   We discussed the results of the labs/imaging. The plan is follow up with PCP for clearance, take medications. We discussed strict return precautions and red flag symptoms. The patient verbalized their understanding and agrees to the plan. The patient is stable and being discharged home in good condition.  Portions of this report may have been transcribed using voice recognition software. Every effort was made to ensure accuracy; however, inadvertent computerized transcription errors may be present.   I discussed this case with my attending physician who cosigned this note including patient's presenting symptoms, physical exam, and planned diagnostics and interventions. Attending physician stated agreement with plan or made changes to plan which were implemented.   Final Clinical Impression(s) / ED Diagnoses Final diagnoses:  Closed fracture of one rib of right side, initial encounter  Pneumonia of right lung due to infectious organism, unspecified part of lung    Rx / DC Orders ED Discharge Orders          Ordered    doxycycline  (VIBRAMYCIN ) 100 MG capsule  2 times daily        03/13/23 2140              Bernis Ernst, [place] 03/14/23 0021    Tegeler, Lonni PARAS, MD 03/14/23 [phone]

## 2023-03-13 NOTE — ED Notes (Signed)
 Pt and spouse provided and educated on incentive spirometer. Pt Aox4, verbalized understanding of instructions.

## 2023-03-13 NOTE — ED Provider Triage Note (Signed)
 Emergency Medicine Provider Triage Evaluation Note  Danny Duke , a 88 y.o. male  was evaluated in triage.  Pt complains of R sided rib pain.  Review of Systems  Positive: Fall 3 weeks ago, -blood thinners, SOB Negative: Fever, chills, back pain, HA, able to ambulate, N/V, abd pain, confusion, altered, weakness  Physical Exam  BP 133/84 (BP Location: Right Arm)   Pulse 99   Temp 98.6 F (37 C)   Resp 16   SpO2 98%  Gen:   Awake, no distress   Resp:  Normal effort  MSK:   Moves extremities without difficulty  Other:  TTP R sided ribs  Medical Decision Making  Medically screening exam initiated at 12:33 PM.  Appropriate orders placed.  Danny Duke was informed that the remainder of the evaluation will be completed by another provider, this initial triage assessment does not replace that evaluation, and the importance of remaining in the ED until their evaluation is complete.  Imaging ordered   Francis Ileana SAILOR, PA-C 03/13/23 1238

## 2023-03-15 ENCOUNTER — Inpatient Hospital Stay: Payer: Medicare (Managed Care) | Attending: Internal Medicine | Admitting: Internal Medicine

## 2023-03-15 ENCOUNTER — Other Ambulatory Visit: Payer: Medicare Other

## 2023-03-15 VITALS — BP 127/80 | HR 87 | Temp 97.4°F | Resp 17 | Ht 68.0 in | Wt 163.2 lb

## 2023-03-15 DIAGNOSIS — C342 Malignant neoplasm of middle lobe, bronchus or lung: Secondary | ICD-10-CM | POA: Diagnosis present

## 2023-03-15 DIAGNOSIS — M549 Dorsalgia, unspecified: Secondary | ICD-10-CM | POA: Diagnosis not present

## 2023-03-15 DIAGNOSIS — Z7982 Long term (current) use of aspirin: Secondary | ICD-10-CM | POA: Insufficient documentation

## 2023-03-15 DIAGNOSIS — Z7984 Long term (current) use of oral hypoglycemic drugs: Secondary | ICD-10-CM | POA: Diagnosis not present

## 2023-03-15 DIAGNOSIS — C3490 Malignant neoplasm of unspecified part of unspecified bronchus or lung: Secondary | ICD-10-CM | POA: Diagnosis not present

## 2023-03-15 DIAGNOSIS — I700 Atherosclerosis of aorta: Secondary | ICD-10-CM | POA: Insufficient documentation

## 2023-03-15 DIAGNOSIS — N189 Chronic kidney disease, unspecified: Secondary | ICD-10-CM | POA: Diagnosis not present

## 2023-03-15 DIAGNOSIS — J432 Centrilobular emphysema: Secondary | ICD-10-CM | POA: Insufficient documentation

## 2023-03-15 DIAGNOSIS — E7800 Pure hypercholesterolemia, unspecified: Secondary | ICD-10-CM | POA: Insufficient documentation

## 2023-03-15 DIAGNOSIS — I714 Abdominal aortic aneurysm, without rupture, unspecified: Secondary | ICD-10-CM | POA: Diagnosis not present

## 2023-03-15 DIAGNOSIS — I129 Hypertensive chronic kidney disease with stage 1 through stage 4 chronic kidney disease, or unspecified chronic kidney disease: Secondary | ICD-10-CM | POA: Insufficient documentation

## 2023-03-15 DIAGNOSIS — T451X5A Adverse effect of antineoplastic and immunosuppressive drugs, initial encounter: Secondary | ICD-10-CM | POA: Insufficient documentation

## 2023-03-15 DIAGNOSIS — G620 Drug-induced polyneuropathy: Secondary | ICD-10-CM | POA: Insufficient documentation

## 2023-03-15 DIAGNOSIS — Z79899 Other long term (current) drug therapy: Secondary | ICD-10-CM | POA: Diagnosis not present

## 2023-03-15 DIAGNOSIS — S2241XA Multiple fractures of ribs, right side, initial encounter for closed fracture: Secondary | ICD-10-CM | POA: Diagnosis not present

## 2023-03-15 NOTE — Progress Notes (Signed)
 [facility] Telephone:([phone]) [phone]   [phone]) [phone]  OFFICE PROGRESS NOTE  [name], NP [address] [place] [address]  DIAGNOSIS: Recurrent non-small cell lung cancer, squamous cell carcinoma presented with right lower lobe lung nodule in August 2021 initially diagnosed as stage IIB (T3, N0, M0) non-small cell lung cancer, invasive well-differentiated squamous cell carcinoma presented with right middle lobe lung mass in May 2019 .  PRIOR THERAPY:  1) Status post right VATS, right middle lobectomy with mediastinal lymph node sampling under the care of Dr. Kerrin on 09/19/2017.  The tumor measured 4.2 cm but the carcinoma extends through the visceral pleura. 2) Adjuvant systemic chemotherapy with carboplatin  for AUC of 6 and paclitaxel  200 mg/M2 every 3 weeks.  First dose 11/16/2017.  Status post 3 cycles.  Last dose was giving January 05, 2018 discontinued secondary to intolerance with significant peripheral neuropathy. 3) curative radiotherapy to the right lower lobe pulmonary nodule under the care of Dr. Patrcia completed on December 21, 2019.  CURRENT THERAPY: Observation.  INTERVAL HISTORY: Danny Duke 88 y.o. male returns to the clinic today for follow-up visit accompanied by his wife. Discussed the use of AI scribe software for clinical note transcription with the patient, who gave verbal consent to proceed.  History of Present Illness   The patient, an 88 year old with a history of stage 2B non-small cell lung cancer, initially diagnosed in July 2019, underwent right middle lobe resection followed by chemotherapy. Subsequently, a nodule was identified in the right lower lobe, which was treated with stereotactic body radiation therapy (SBRT). The patient has been under observation since then. A scan three months ago revealed a density in the right upper lobe, raising concerns of potential malignancy or inflammation. The patient reports  feeling well, with no complaints of chest pain or breathing difficulties. However, he experienced a fall three weeks ago, resulting in fractures on the right side, necessitating an emergency room visit. The most recent scan indicates improvement in the previously identified area of concern in the right upper lobe, suggesting the presence of inflammation rather than cancer.      MEDICAL HISTORY: Past Medical History:  Diagnosis Date   AAA (abdominal aortic aneurysm) (HCC)    Chronic back pain    Chronic kidney disease    Hypercholesterolemia    Hypertension    Right Lung dx'd 09/2017   s/p RMLobectomy 2019 and adjuvant chemotherapy and SBRT    ALLERGIES:  has no known allergies.  MEDICATIONS:  Current Outpatient Medications  Medication Sig Dispense Refill   acetaminophen  (TYLENOL ) 500 MG tablet Take 1,000 mg by mouth every 6 (six) hours as needed for moderate pain or headache.     allopurinol  (ZYLOPRIM ) 100 MG tablet Take 100 mg by mouth daily.     amLODipine  (NORVASC ) 10 MG tablet Take 10 mg by mouth daily.     aspirin  EC 81 MG tablet Take 81 mg by mouth daily.     benzonatate  (TESSALON ) 100 MG capsule Take 1 capsule (100 mg total) by mouth every 8 (eight) hours as needed for cough. (Patient not taking: Reported on 03/13/2023) 10 capsule 0   doxycycline  (VIBRAMYCIN ) 100 MG capsule Take 1 capsule (100 mg total) by mouth 2 (two) times daily. 14 capsule 0   DULoxetine  (CYMBALTA ) 60 MG capsule TAKE 1 CAPSULE(60 MG) BY MOUTH DAILY (Patient taking differently: Take 60 mg by mouth in the morning.) 30 capsule 3   FARXIGA  10 MG TABS  tablet Take 10 mg by mouth daily.     gabapentin  (NEURONTIN ) 300 MG capsule TAKE 3 CAPSULES(900 MG) BY MOUTH THREE TIMES DAILY (Patient taking differently: Take 900 mg by mouth 3 (three) times daily.) 120 capsule 6   lidocaine  (LIDODERM ) 5 % Place 1 patch onto the skin daily. Remove & Discard patch within 12 hours or as directed by MD (Patient not taking: Reported on  03/13/2023) 30 patch 0   metoprolol  succinate (TOPROL -XL) 25 MG 24 hr tablet Take 1 tablet (25 mg total) by mouth daily. 60 tablet 1   omeprazole (PRILOSEC) 40 MG capsule Take 40 mg by mouth daily before breakfast.     oxyCODONE  (ROXICODONE ) 5 MG immediate release tablet Take 1 tablet (5 mg total) by mouth every 8 (eight) hours as needed for up to 12 doses for breakthrough pain. 10 tablet 0   simvastatin  (ZOCOR ) 20 MG tablet Take 20 mg by mouth daily.     No current facility-administered medications for this visit.    SURGICAL HISTORY:  Past Surgical History:  Procedure Laterality Date   ABDOMINAL AORTIC ENDOVASCULAR STENT GRAFT Right 02/03/2022   Procedure: ABDOMINAL AORTIC ENDOVASCULAR STENT GRAFT;  Surgeon: Serene Gaile ORN, MD;  Location: [facility] OR;  Service: Vascular;  Laterality: Right;   BRONCHIAL NEEDLE ASPIRATION BIOPSY  08/05/2017   Procedure: BRONCHIAL NEEDLE ASPIRATION BIOPSIES;  Surgeon: Alaine Vicenta NOVAK, MD;  Location: [facility];  Service: Cardiopulmonary;;   ENDOBRONCHIAL ULTRASOUND Bilateral 08/05/2017   Procedure: ENDOBRONCHIAL ULTRASOUND;  Surgeon: Alaine Vicenta NOVAK, MD;  Location: [facility];  Service: Cardiopulmonary;  Laterality: Bilateral;   IR IMAGING GUIDED PORT INSERTION  12/27/2017   IR REMOVAL TUN ACCESS W/ PORT W/O FL MOD SED  03/25/2020   MEDIASTINOSCOPY N/A 09/19/2017   Procedure: MEDIASTINOSCOPY;  Surgeon: Kerrin Elspeth BROCKS, MD;  Location: [facility] OR;  Service: Thoracic;  Laterality: N/A;   MULTIPLE TOOTH EXTRACTIONS     VIDEO ASSISTED THORACOSCOPY (VATS)/ LOBECTOMY Right 09/19/2017   Procedure: RIGHT VIDEO ASSISTED THORACOSCOPY (VATS)/ LOBECTOMY;  Surgeon: Kerrin Elspeth BROCKS, MD;  Location: [facility] OR;  Service: Thoracic;  Laterality: Right;    REVIEW OF SYSTEMS:  A comprehensive review of systems was negative except for: Constitutional: positive for fatigue Respiratory: positive for dyspnea on exertion and pleurisy/chest pain   PHYSICAL EXAMINATION: General  appearance: alert, cooperative, fatigued, and no distress Head: Normocephalic, without obvious abnormality, atraumatic Neck: no adenopathy, no JVD, supple, symmetrical, trachea midline, and thyroid not enlarged, symmetric, no tenderness/mass/nodules Lymph nodes: Cervical, supraclavicular, and axillary nodes normal. Resp: clear to auscultation bilaterally Back: symmetric, no curvature. ROM normal. No CVA tenderness. Cardio: regular rate and rhythm, S1, S2 normal, no murmur, click, rub or gallop GI: soft, non-tender; bowel sounds normal; no masses,  no organomegaly Extremities: extremities normal, atraumatic, no cyanosis or edema  ECOG PERFORMANCE STATUS: 1 - Symptomatic but completely ambulatory  Blood pressure 127/80, pulse 87, temperature (!) 97.4 F (36.3 C), temperature source Temporal, resp. rate 17, height 5' 8 (1.727 m), weight 163 lb 3.2 oz (74 kg), SpO2 100%.  LABORATORY DATA: Lab Results  Component Value Date   WBC 5.0 03/13/2023   HGB 13.1 03/13/2023   HCT 40.0 03/13/2023   MCV 94.3 03/13/2023   PLT 125 (L) 03/13/2023      Chemistry      Component Value Date/Time   NA 139 03/13/2023 1730   K 3.7 03/13/2023 1730   CL 107 03/13/2023 1730   CO2 25 03/13/2023 1730   BUN 8  03/13/2023 1730   CREATININE 1.04 03/13/2023 1730   CREATININE 1.25 (H) 03/04/2023 0954      Component Value Date/Time   CALCIUM 9.7 03/13/2023 1730   ALKPHOS 66 03/13/2023 1730   AST 24 03/13/2023 1730   AST 19 03/04/2023 0954   ALT 13 03/13/2023 1730   ALT 9 03/04/2023 0954   BILITOT 0.7 03/13/2023 1730   BILITOT 0.4 03/04/2023 0954       RADIOGRAPHIC STUDIES: CT Chest Wo Contrast Result Date: 03/15/2023 CLINICAL DATA:  Non-small-cell lung cancer. Restaging. * Tracking Code: BO * EXAM: CT CHEST WITHOUT CONTRAST TECHNIQUE: Multidetector CT imaging of the chest was performed following the standard protocol without IV contrast. RADIATION DOSE REDUCTION: This exam was performed according to the  departmental dose-optimization program which includes automated exposure control, adjustment of the mA and/or kV according to patient size and/or use of iterative reconstruction technique. COMPARISON:  None 06/26/2022 FINDINGS: Cardiovascular: The heart size is normal. No substantial pericardial effusion. Coronary artery calcification is evident. Mild atherosclerotic calcification is noted in the wall of the thoracic aorta. Mediastinum/Nodes: Calcified mediastinal and hilar lymph nodes again noted, without substantial change. The esophagus has normal imaging features. There is no axillary lymphadenopathy. Lungs/Pleura: Centrilobular and paraseptal emphysema evident. Biapical pleuroparenchymal scarring is similar to prior. Cavitary lesion medial right upper lobe on the previous exam has decreased in the interval3 measuring 9 x 8 mm today on image 34/6, decreased from 2.9 x 1.7 cm previously. Surgical changes again noted anterior right lung. The ill-defined irregular posterior right lung lesion (60/6) is stable as is the adjacent overlying nonacute posterior 7th rib fracture. Stable 4 mm left upper lobe pulmonary nodule on 61/6. Architectural distortion in the central left upper lobe on 36/6 is stable. Upper Abdomen: Scattered tiny hypodensities in the liver parenchyma are too small to characterize but are statistically most likely benign. No followup imaging is recommended. Tiny well-defined homogeneous low-density lesions in both kidneys are too small to characterize but are statistically most likely benign and probably cysts. No followup imaging is recommended. Musculoskeletal: No worrisome lytic or sclerotic osseous abnormality. IMPRESSION: 1. Interval decrease in size of the irregular lesion medial right upper lobe, now with a more confluent nodular appearance. Continued close attention recommended. 2. Stable ill-defined irregular posterior right lung lesion with adjacent nonacute posterior 7th rib fracture. 3.  Stable 4 mm left upper lobe pulmonary nodule. 4. Scattered areas of architectural distortion and scarring again noted in both lungs. 5. Calcified mediastinal and hilar lymph nodes compatible with prior granulomatous disease. 6. Aortic Atherosclerosis (ICD10-I70.0) and Emphysema (ICD10-J43.9). Electronically Signed   By: Camellia Candle M.D.   On: 03/15/2023 09:24   CT Angio Chest PE W and/or Wo Contrast Result Date: 03/13/2023 CLINICAL DATA:  PE suspected. Fall from 3 feet 2 weeks ago landing on back. Now has right anterior flank and rib pain. Shortness of breath. EXAM: CT ANGIOGRAPHY CHEST WITH CONTRAST TECHNIQUE: Multidetector CT imaging of the chest was performed using the standard protocol during bolus administration of intravenous contrast. Multiplanar CT image reconstructions and MIPs were obtained to evaluate the vascular anatomy. RADIATION DOSE REDUCTION: This exam was performed according to the departmental dose-optimization program which includes automated exposure control, adjustment of the mA and/or kV according to patient size and/or use of iterative reconstruction technique. CONTRAST:  75mL OMNIPAQUE  IOHEXOL  350 MG/ML SOLN COMPARISON:  CT 11/30/2022 and 03/04/2023 FINDINGS: Cardiovascular: Negative for acute pulmonary embolism. No pericardial effusion. Coronary artery and aortic atherosclerotic calcification. Mediastinum/Nodes:  Trachea is unremarkable. Fluid and debris layering in the esophagus. Multiple calcified hilar and mediastinal lymph nodes. 13 mm right paratracheal node (7/98) is unchanged. Lungs/Pleura: Advanced emphysema. Biapical pleural-parenchymal scarring. Bullous change in the right lower lobe. Postoperative change of right middle lobectomy. Ground-glass opacities and interlobular septal thickening is new compared to 03/04/2023. Diffuse bronchial wall thickening. Unchanged radiation fibrosis along the posterior right lower lobe. 4 mm nodule in the lingula on series 7/image 154 is stable  dating back to 2022. No pleural effusion or pneumothorax. Upper Abdomen: No acute abnormality. Musculoskeletal: Similar mildly displaced fracture of the posterior right seventh rib adjacent to the radiation changes compatible with insufficiency fracture. No acute fracture. Review of the MIP images confirms the above findings. IMPRESSION: 1. Negative for acute pulmonary embolism. 2. Findings favor superimposed infection or edema on a background of advanced emphysema. 3. Similar mildly displaced fracture of the posterior right seventh rib adjacent to the radiation changes compatible with insufficiency fracture. 4. Fluid and debris layering in the esophagus compatible with reflux Aortic Atherosclerosis (ICD10-I70.0) and Emphysema (ICD10-J43.9). Electronically Signed   By: Norman Gatlin M.D.   On: 03/13/2023 20:03   DG Ribs Unilateral Right Result Date: 03/13/2023 CLINICAL DATA:  Fall from 3 feet 2 weeks ago landing on back. Now has right anterior and flank rib pain. EXAM: RIGHT RIBS - 2 VIEW COMPARISON:  CT 03/04/2023. FINDINGS: No displaced rib fractures identified. Chronic lung disease noted within the imaged portions of the lungs with postoperative change noted in the right midlung. IMPRESSION: 1. No displaced rib fractures identified. 2. Chronic lung disease. Electronically Signed   By: Waddell Calk M.D.   On: 03/13/2023 13:32   DG Chest 2 View Result Date: 03/13/2023 CLINICAL DATA:  Shortness of breath EXAM: CHEST - 2 VIEW COMPARISON:  09/24/2022 FINDINGS: Chronic lung disease with biapical architectural distortion. Band of chain sutures and scarring at the right mid lung with costophrenic sulcus blunting on the right, history of treated lung cancer. Normal heart size. Scarring causes distortion of mediastinal contours which is stable. There is no edema, consolidation, effusion, or pneumothorax. IMPRESSION: Mix of chronic lung disease, post treatment change, and emphysema. No acute superimposed finding.  Electronically Signed   By: Dorn Roulette M.D.   On: 03/13/2023 11:29     ASSESSMENT AND PLAN: This is a very pleasant 88 years old African-American male with likely recurrent lung cancer initially diagnosed as stage IIB (T3, N0, M0) invasive well-differentiated squamous cell carcinoma presented with right middle lobe lung mass status post right middle lobectomy with lymph node sampling on September 19, 2017 under the care of Dr. Kerrin. The patient underwent adjuvant treatment with systemic chemotherapy with carboplatin  for AUC of 6 and paclitaxel  200 mg/M2 every 3 weeks with Neulasta  support status post 3 cycles. He tolerated this treatment well except for the chemotherapy-induced peripheral neuropathy and he discontinued his treatment after cycle #3. He underwent CT-guided core biopsy of the enlarging right lower lobe lung nodule and the final pathology was consistent with squamous cell carcinoma. He underwent SBRT to the right lower lobe lung nodule by Dr. Patrcia and tolerated the procedure fairly well. He is currently on observation.  The patient is feeling fine today with no concerning complaints except for mild shortness of breath with exertion. He had repeat CT scan of the chest performed recently.  I personally and independently reviewed the scan with the patient and his wife.  There is no concerning findings for disease progression.  Non-Small Cell Lung Cancer (NSCLC) Stage 2B NSCLC initially diagnosed in July 2019. Right middle lobe resection followed by chemotherapy. Recent nodule in the right lower lobe treated with SBRT. Last scan three months ago showed density in the right upper lobe, initially concerning for cancer or inflammation. Recent scan indicates improvement, suggesting inflammation rather than malignancy. Discussed the importance of continued observation and the plan to repeat the scan in six months to monitor for any changes. - Repeat scan in six months - Perform lab  tests before the next visit  Right-Sided Rib Fractures Sustained from a fall three weeks ago. Evaluated in the emergency room with confirmed fractures on the right side. Advised to avoid activities that increase fall risk, especially given age and recent injury. - Advise to avoid activities that increase fall risk - Monitor for any complications or pain  General Health Maintenance 88 years old and should avoid high-risk activities to prevent falls. Discussed the importance of being cautious to prevent further injuries. - Advise to avoid climbing or engaging in risky activities  Follow-up - Schedule follow-up visit in six months - Perform lab tests and scan before the next visit.   The patient was advised to call immediately if he has any concerning symptoms in the interval.  The patient voices understanding of current disease status and treatment options and is in agreement with the current care plan.  All questions were answered. The patient knows to call the clinic with any problems, questions or concerns. We can certainly see the patient much sooner if necessary.  Disclaimer: This note was dictated with voice recognition software. Similar sounding words can inadvertently be transcribed and may not be corrected upon review.

## 2023-06-30 ENCOUNTER — Emergency Department (HOSPITAL_COMMUNITY)
Admission: EM | Admit: 2023-06-30 | Discharge: 2023-06-30 | Disposition: A | Discharge status: Home / Self Care | Attending: Emergency Medicine | Admitting: Emergency Medicine

## 2023-06-30 ENCOUNTER — Emergency Department (HOSPITAL_COMMUNITY)

## 2023-06-30 ENCOUNTER — Encounter: Payer: Self-pay | Admitting: Internal Medicine

## 2023-06-30 ENCOUNTER — Other Ambulatory Visit: Payer: Self-pay

## 2023-06-30 ENCOUNTER — Encounter (HOSPITAL_COMMUNITY): Payer: Self-pay | Admitting: Emergency Medicine

## 2023-06-30 DIAGNOSIS — W19XXXA Unspecified fall, initial encounter: Secondary | ICD-10-CM

## 2023-06-30 DIAGNOSIS — J449 Chronic obstructive pulmonary disease, unspecified: Secondary | ICD-10-CM | POA: Diagnosis not present

## 2023-06-30 DIAGNOSIS — W182XXA Fall in (into) shower or empty bathtub, initial encounter: Secondary | ICD-10-CM | POA: Insufficient documentation

## 2023-06-30 DIAGNOSIS — R9431 Abnormal electrocardiogram [ECG] [EKG]: Secondary | ICD-10-CM | POA: Diagnosis not present

## 2023-06-30 DIAGNOSIS — Z7982 Long term (current) use of aspirin: Secondary | ICD-10-CM | POA: Insufficient documentation

## 2023-06-30 DIAGNOSIS — M25512 Pain in left shoulder: Secondary | ICD-10-CM

## 2023-06-30 DIAGNOSIS — I6782 Cerebral ischemia: Secondary | ICD-10-CM | POA: Insufficient documentation

## 2023-06-30 DIAGNOSIS — Z85118 Personal history of other malignant neoplasm of bronchus and lung: Secondary | ICD-10-CM | POA: Diagnosis not present

## 2023-06-30 DIAGNOSIS — R531 Weakness: Secondary | ICD-10-CM | POA: Insufficient documentation

## 2023-06-30 LAB — COMPREHENSIVE METABOLIC PANEL WITH GFR
ALT: 11 U/L (ref 0–44)
AST: 20 U/L (ref 15–41)
Albumin: 3.5 g/dL (ref 3.5–5.0)
Alkaline Phosphatase: 56 U/L (ref 38–126)
Anion gap: 7 (ref 5–15)
BUN: 19 mg/dL (ref 8–23)
CO2: 22 mmol/L (ref 22–32)
Calcium: 9.2 mg/dL (ref 8.9–10.3)
Chloride: 106 mmol/L (ref 98–111)
Creatinine, Ser: 1.37 mg/dL — ABNORMAL HIGH (ref 0.61–1.24)
GFR, Estimated: 51 mL/min — ABNORMAL LOW (ref >60)
Glucose, Bld: 130 mg/dL — ABNORMAL HIGH (ref 70–99)
Potassium: 3.9 mmol/L (ref 3.5–5.1)
Sodium: 135 mmol/L (ref 135–145)
Total Bilirubin: 1.2 mg/dL (ref 0.0–1.2)
Total Protein: 7.4 g/dL (ref 6.5–8.1)

## 2023-06-30 LAB — URINALYSIS, ROUTINE W REFLEX MICROSCOPIC
Bacteria, UA: NONE SEEN
Bilirubin Urine: NEGATIVE
Glucose, UA: NEGATIVE mg/dL
Ketones, ur: NEGATIVE mg/dL
Leukocytes,Ua: NEGATIVE
Nitrite: NEGATIVE
Protein, ur: 30 mg/dL — AB
Specific Gravity, Urine: 1.019 (ref 1.005–1.030)
pH: 5 (ref 5.0–8.0)

## 2023-06-30 LAB — CBC WITH DIFFERENTIAL/PLATELET
Abs Immature Granulocytes: 0.04 10*3/uL (ref 0.00–0.07)
Basophils Absolute: 0 10*3/uL (ref 0.0–0.1)
Basophils Relative: 0 %
Eosinophils Absolute: 0 10*3/uL (ref 0.0–0.5)
Eosinophils Relative: 0 %
HCT: 39 % (ref 39.0–52.0)
Hemoglobin: 12.8 g/dL — ABNORMAL LOW (ref 13.0–17.0)
Immature Granulocytes: 0 %
Lymphocytes Relative: 5 %
Lymphs Abs: 0.5 10*3/uL — ABNORMAL LOW (ref 0.7–4.0)
MCH: 30.9 pg (ref 26.0–34.0)
MCHC: 32.8 g/dL (ref 30.0–36.0)
MCV: 94.2 fL (ref 80.0–100.0)
Monocytes Absolute: 0.9 10*3/uL (ref 0.1–1.0)
Monocytes Relative: 9 %
Neutro Abs: 8.3 10*3/uL — ABNORMAL HIGH (ref 1.7–7.7)
Neutrophils Relative %: 86 %
Platelets: 106 10*3/uL — ABNORMAL LOW (ref 150–400)
RBC: 4.14 MIL/uL — ABNORMAL LOW (ref 4.22–5.81)
RDW: 14 % (ref 11.5–15.5)
WBC: 9.8 10*3/uL (ref 4.0–10.5)
nRBC: 0 % (ref 0.0–0.2)

## 2023-06-30 LAB — RESP PANEL BY RT-PCR (RSV, FLU A&B, COVID)  RVPGX2
Influenza A by PCR: NEGATIVE
Influenza B by PCR: NEGATIVE
Resp Syncytial Virus by PCR: NEGATIVE
SARS Coronavirus 2 by RT PCR: NEGATIVE

## 2023-06-30 LAB — TROPONIN I (HIGH SENSITIVITY): Troponin I (High Sensitivity): 9 ng/L (ref <18)

## 2023-06-30 MED ORDER — MORPHINE SULFATE (PF) 4 MG/ML IV SOLN
4.0000 mg | Freq: Once | INTRAVENOUS | Status: AC
  Administered 2023-06-30: 4 mg via INTRAVENOUS
  Filled 2023-06-30: qty 1

## 2023-06-30 MED ORDER — SODIUM CHLORIDE 0.9 % IV BOLUS
1000.0000 mL | Freq: Once | INTRAVENOUS | Status: AC
  Administered 2023-06-30: 1000 mL via INTRAVENOUS

## 2023-06-30 MED ORDER — OXYCODONE-ACETAMINOPHEN 5-325 MG PO TABS
1.0000 | ORAL_TABLET | Freq: Once | ORAL | Status: AC
  Administered 2023-06-30: 1 via ORAL
  Filled 2023-06-30: qty 1

## 2023-06-30 NOTE — ED Notes (Signed)
 Patient transported to CT

## 2023-06-30 NOTE — ED Provider Notes (Cosign Needed Addendum)
 [place] EMERGENCY DEPARTMENT AT [facility] Provider Note   CSN: [id number] Arrival date & time: 06/30/23  1145     History Chief Complaint  Patient presents with   Fall   Shoulder Pain    Danny Duke is a 88 y.o. male.  Patient with past history significant for lung cancer, COPD, abdominal aortic aneurysm presents to the emergency department after a fall.  Patient states that he fell this morning due to feelings of weakness.  He states that he was about to get into his shower when he fell and hit his left shoulder/back against the shower wall.  Pain is primarily to the lateral aspect of the left shoulder.  He states that he did have a fever last night but was measured at 102 F.  He does report hitting his head when he fell down this morning and he does take 81 mg aspirin  daily but denies any use of DOACs or other blood thinners.  No reported headache, nausea, vomiting, or confusion.  Patient's spouse at bedside reports some increased urinary frequency.   Fall  Shoulder Pain      Home Medications Prior to Admission medications   Medication Sig Start Date End Date Taking? Authorizing Provider  acetaminophen  (TYLENOL ) 500 MG tablet Take 1,000 mg by mouth every 6 (six) hours as needed for moderate pain or headache.    [provider]  allopurinol  (ZYLOPRIM ) 100 MG tablet Take 100 mg by mouth daily. 10/23/21   [provider]  amLODipine  (NORVASC ) 10 MG tablet Take 10 mg by mouth daily.    [provider]  aspirin  EC 81 MG tablet Take 81 mg by mouth daily.    [provider]  benzonatate  (TESSALON ) 100 MG capsule Take 1 capsule (100 mg total) by mouth every 8 (eight) hours as needed for cough. Patient not taking: Reported on 03/13/2023 09/24/22   Spence Dux, PA-C  doxycycline  (VIBRAMYCIN ) 100 MG capsule Take 1 capsule (100 mg total) by mouth 2 (two) times daily. 03/13/23   Spence Dux, PA-C  DULoxetine  (CYMBALTA ) 60 MG capsule TAKE  1 CAPSULE(60 MG) BY MOUTH DAILY Patient taking differently: Take 60 mg by mouth in the morning. 02/14/23   Vaslow, Zachary K, MD  FARXIGA  10 MG TABS tablet Take 10 mg by mouth daily. 01/19/22   [provider]  gabapentin  (NEURONTIN ) 300 MG capsule TAKE 3 CAPSULES(900 MG) BY MOUTH THREE TIMES DAILY Patient taking differently: Take 900 mg by mouth 3 (three) times daily. 10/06/20   Vaslow, Zachary K, MD  lidocaine  (LIDODERM ) 5 % Place 1 patch onto the skin daily. Remove & Discard patch within 12 hours or as directed by MD Patient not taking: Reported on 03/13/2023 10/04/22   Lowery Rue, DO  metoprolol  succinate (TOPROL -XL) 25 MG 24 hr tablet Take 1 tablet (25 mg total) by mouth daily. 09/26/17   Gold, Wayne E, PA-C  omeprazole (PRILOSEC) 40 MG capsule Take 40 mg by mouth daily before breakfast.    [provider]  oxyCODONE  (ROXICODONE ) 5 MG immediate release tablet Take 1 tablet (5 mg total) by mouth every 8 (eight) hours as needed for up to 12 doses for breakthrough pain. 10/04/22   Curatolo, Adam, DO  simvastatin  (ZOCOR ) 20 MG tablet Take 20 mg by mouth daily.    [provider]      Allergies    Patient has no known allergies.    Review of Systems   Review of Systems  Neurological:  Positive for weakness.  All other systems reviewed and are negative.   Physical Exam Updated Vital Signs BP 129/70 (BP Location: Right Arm)   Pulse 99   Temp 98.6 F (37 C) (Oral)   Resp 20   Ht 5\' 8"  (1.727 m)   Wt 73.9 kg   SpO2 95%   BMI 24.78 kg/m  Physical Exam Vitals and nursing note reviewed.  Constitutional:      General: He is not in acute distress.    Appearance: He is well-developed.  HENT:     Head: Normocephalic and atraumatic.  Eyes:     Conjunctiva/sclera: Conjunctivae normal.  Cardiovascular:     Rate and Rhythm: Normal rate and regular rhythm.     Heart sounds: No murmur heard. Pulmonary:     Effort: Pulmonary effort is normal. No respiratory  distress.     Breath sounds: Normal breath sounds.  Abdominal:     Palpations: Abdomen is soft.     Tenderness: There is no abdominal tenderness.  Musculoskeletal:        General: Tenderness present. No swelling, deformity or signs of injury. Normal range of motion.     Cervical back: Neck supple.     Comments: TTP along the lateral aspect of the left shoulder. No AC joint tenderness. Able to range left shoulder, elbow, and wrist without limitations.  Small abrasion noted to the posterior left shoulder but no lacerations seen.  No chest wall tenderness or diminished breath sounds. No bruising seen.  Skin:    General: Skin is warm and dry.     Capillary Refill: Capillary refill takes less than 2 seconds.  Neurological:     Mental Status: He is alert.  Psychiatric:        Mood and Affect: Mood normal.     ED Results / Procedures / Treatments   Labs (all labs ordered are listed, but only abnormal results are displayed) Labs Reviewed  CBC WITH DIFFERENTIAL/PLATELET - Abnormal; Notable for the following components:      Result Value   RBC 4.14 (*)    Hemoglobin 12.8 (*)    Platelets 106 (*)    Neutro Abs 8.3 (*)    Lymphs Abs 0.5 (*)    All other components within normal limits  COMPREHENSIVE METABOLIC PANEL WITH GFR - Abnormal; Notable for the following components:   Glucose, Bld 130 (*)    Creatinine, Ser 1.37 (*)    GFR, Estimated 51 (*)    All other components within normal limits  URINALYSIS, ROUTINE W REFLEX MICROSCOPIC - Abnormal; Notable for the following components:   Hgb urine dipstick MODERATE (*)    Protein, ur 30 (*)    All other components within normal limits  RESP PANEL BY RT-PCR (RSV, FLU A&B, COVID)  [id number]  TROPONIN I (HIGH SENSITIVITY)  TROPONIN I (HIGH SENSITIVITY)    EKG EKG Interpretation Date/Time:  Thursday June 30 2023 14:10:10 EDT Ventricular Rate:  70 PR Interval:  152 QRS Duration:  80 QT Interval:  384 QTC Calculation: 414 R  Axis:   17  Text Interpretation: Normal sinus rhythm Minimal voltage criteria for LVH, may be normal variant ( R in aVL ) T wave abnormality Abnormal ECG Confirmed by Dorenda Gandy [phone]) on 06/30/2023 4:39:18 PM  Radiology CT Cervical Spine Wo Contrast Result Date: 06/30/2023 CLINICAL DATA:  Neck trauma (Age >= 65y), fall in bathtub, weakness, loss of balance EXAM: CT CERVICAL SPINE WITHOUT CONTRAST TECHNIQUE: Multidetector CT imaging of  the cervical spine was performed without intravenous contrast. Multiplanar CT image reconstructions were also generated. RADIATION DOSE REDUCTION: This exam was performed according to the departmental dose-optimization program which includes automated exposure control, adjustment of the mA and/or kV according to patient size and/or use of iterative reconstruction technique. COMPARISON:  None Available. FINDINGS: Alignment: Straightening of the normal cervical lordosis. No spondylolisthesis, uncovering of the facet joints, or significant widening of the spinous processes. No subluxation or abnormality identified at the craniovertebral junction. Skull base and vertebrae: Vertebral body heights are preserved. No acute fracture. No primary bone lesion or focal pathologic process.The lateral masses of C1 are well aligned with C2. The odontoid is intact. Soft tissues and spinal canal: No prevertebral edema or soft tissue thickening. No visible canal hematoma. Disc levels: Bony fusion across the C5-C6 disc space with severe intervertebral disc height loss at C6-C7. Moderate to severe height loss is also present at C7-T1. Disc bulge at C6-C7 and C7-T1 causing mild spinal canal narrowing. Multilevel uncovertebral hypertrophy with facet arthropathy throughout the cervical spine causing multilevel neural foraminal stenosis. This is most severe bilaterally at C4-C5 and on the left at C3-C4. Upper chest: Moderate cyst paraseptal and centrilobular emphysema with extensive nodular and  masslike pleuroparenchymal scarring. Other: None IMPRESSION: 1. Straightening of the normal cervical lordosis, which may be due to muscular spasm or underlying degenerative changes. No acute fracture or malalignment of the cervical spine. 2. Advanced multilevel degenerative disc disease, as delineated above. Severe bilateral neuroforaminal stenosis at C4-C5. No high-grade spinal canal narrowing. Electronically Signed   By: Rance Burrows M.D.   On: 06/30/2023 16:25   CT Head Wo Contrast Result Date: 06/30/2023 CLINICAL DATA:  Head trauma, minor (Age >= 65y) EXAM: CT HEAD WITHOUT CONTRAST TECHNIQUE: Contiguous axial images were obtained from the base of the skull through the vertex without intravenous contrast. RADIATION DOSE REDUCTION: This exam was performed according to the departmental dose-optimization program which includes automated exposure control, adjustment of the mA and/or kV according to patient size and/or use of iterative reconstruction technique. COMPARISON:  October 04, 2022 FINDINGS: Brain: Proportional prominence of the ventricles and sulci, consistent with diffuse cerebral parenchymal volume loss. The ventricles otherwise maintained midline position without midline shift. Confluent periventricular and subcortical white matter hypoattenuation, most consistent with changes of severe chronic ischemic microvascular disease. No evidence of acute territorial infarction, extra-axial fluid collection, hemorrhage, or mass lesion. The basilar cisterns are patent without downward herniation. The cerebellar hemispheres and vermis are well formed without mass lesion or focal attenuation abnormality. Vascular: No hyperdense vessel. Calcified atherosclerotic plaque within the cavernous/supraclinoid internal carotid arteries. Skull: Normal. Negative for fracture or focal lesion. Sinuses/Orbits: The paranasal sinuses and mastoids are clear. The globes appear intact. No retrobulbar hematoma. Other: None.  IMPRESSION: 1. No acute intracranial abnormality, specifically, no acute hemorrhage, territorial infarction, or intracranial mass. 2. Global cerebral volume loss with fairly severe changes of chronic ischemic microvascular disease. Electronically Signed   By: Rance Burrows M.D.   On: 06/30/2023 16:05   DG Chest Portable 1 View Result Date: 06/30/2023 CLINICAL DATA:  Status post fall with syncope EXAM: PORTABLE CHEST 1 VIEW COMPARISON:  March 13, 2023 FINDINGS: Ill-defined right lower lobe infiltrates and atelectasis with pleural thickening could correlate with chronic changes. No acute infiltrates consolidations or pulmonary edema. No obvious fractures. Heart within normal limits in size. IMPRESSION: Chronic interstitial lung disease, no acute cardiopulmonary infiltrates Electronically Signed   By: Fredrich Jefferson M.D.   On:  06/30/2023 14:36   DG Shoulder Left Port Result Date: 06/30/2023 CLINICAL DATA:  Status post fall and pain EXAM: LEFT SHOULDER COMPARISON:  None Available. FINDINGS: There is no evidence of fracture or dislocation. There is no evidence of arthropathy or other focal bone abnormality. Soft tissues are unremarkable. Osteoarthrosis of the acromioclavicular joint IMPRESSION: Osteoarthrosis No fractures Electronically Signed   By: Fredrich Jefferson M.D.   On: 06/30/2023 13:37    Procedures Procedures    Medications Ordered in ED Medications  morphine  (PF) 4 MG/ML injection 4 mg (4 mg Intravenous Given 06/30/23 1514)  sodium chloride  0.9 % bolus 1,000 mL (0 mLs Intravenous Stopped 06/30/23 1832)  oxyCODONE -acetaminophen  (PERCOCET/ROXICET) 5-325 MG per tablet 1 tablet (1 tablet Oral Given 06/30/23 1711)    ED Course/ Medical Decision Making/ A&P                                 Medical Decision Making Amount and/or Complexity of Data Reviewed Labs: ordered. Radiology: ordered.  Risk Prescription drug management.   This patient presents to the ED for concern of fall, shoulder  pain.  Differential diagnosis includes shoulder dislocation, AC joint injury, SAH, SDH, syncope   Lab Tests:  I Ordered, and personally interpreted labs.  The pertinent results include: CBC unremarkable, CMP with mild evidence of dehydration with slightly elevated creatinine, urinalysis with some blood seen but no evidence of UTI, respiratory panel negative, troponin unremarkable at 9   Imaging Studies ordered:  I ordered imaging studies including x-ray left shoulder, x-ray chest, CT head, CT cervical spine I independently visualized and interpreted imaging which showed:  Osteoarthrosis No fractures Chronic interstitial lung disease, no acute cardiopulmonary infiltrates. 1. No acute intracranial abnormality, specifically, no acute hemorrhage, territorial infarction, or intracranial mass. 2. Global cerebral volume loss with fairly severe changes of chronic ischemic microvascular disease. 1. Straightening of the normal cervical lordosis, which may be due to muscular spasm or underlying degenerative changes. No acute fracture or malalignment of the cervical spine. 2. Advanced multilevel degenerative disc disease, as delineated above. Severe bilateral neuroforaminal stenosis at C4-C5. No high-grade spinal canal narrowing. I agree with the radiologist interpretation   Medicines ordered and prescription drug management:  I ordered medication including morphine , oxycodone , fluids for pain, dehydration Reevaluation of the patient after these medicines showed that the patient improved I have reviewed the patients home medicines and have made adjustments as needed   Problem List / ED Course:  Patient with past history significant for lung cancer, COPD, abdominal aortic aneurysm presents the emergency department today with concerns of a fall.  Reports he experienced a fall this morning while trying into the shower due to concerns of generalized weakness.  States this been ongoing for several weeks  but notably worsening recently.  Reportedly had a fever of 102 F at home.  No obvious infectious symptoms such as cough, congestion, sore throat, chest pain, shortness of breath.  He reportedly has had some increased urinary frequency per his wife's report at bedside. Physical exam reveals an atraumatic left shoulder.  Small abrasion to the posterior left shoulder but no obvious deformity.  No point tenderness to the [facility] joint.  Clavicle appears atraumatic.  The pain is primarily to the lateral aspect of the left shoulder overlying the deltoid.  No obvious bruising seen over this area or hematoma.  Will proceed with imaging including left shoulder, CT head, CT cervical spine, and  chest x-ray.  Also added on EKG and troponin to basic labs obtained from triage for further assessment of possible source of patient's weakness. Lab workup and imaging is all thankfully reassuring.  Doubt rib fractures, and lack of focal tenderness in the chest wall in the anterior, lateral, posterior aspect of the left side.  No bruising present.  X-ray also negative significant pain, do not feel that CT imaging is needed of the chest at this time. No obvious sign of source of patient's weakness.  No evidence of fractures, traumatic head injury, or other abnormal findings.  Given reassuring findings, advised patient he was stable for outpatient follow-up and discharged home.  Patient is mildly dehydrated offered fluid resuscitation with IV fluids and patient is agreeable to this.  Will discharge home after completion of fluids. On reassessment, patient remained stable and stable for outpatient follow-up.  Patient discharged home with plans for outpatient follow-up.  Final Clinical Impression(s) / ED Diagnoses Final diagnoses:  Fall, initial encounter  Acute pain of left shoulder    Rx / DC Orders ED Discharge Orders     None         Concetta Dee, PA-C 06/30/23 1920    Concetta Dee, PA-C 06/30/23 Stefano Edis, MD 07/01/23 [phone]

## 2023-06-30 NOTE — ED Triage Notes (Addendum)
 Patient presents due to a fall this am. He fell backward into the bathtub. Since last night he has had a fever, weakness and loss of balance. He was not febrile in triage. This AM he fell twice. One fall caused left shoulder pain. Patient did report hitting his head and is on blood thinners.

## 2023-07-03 ENCOUNTER — Encounter (HOSPITAL_COMMUNITY): Payer: Self-pay

## 2023-07-03 ENCOUNTER — Emergency Department (HOSPITAL_COMMUNITY)

## 2023-07-03 ENCOUNTER — Observation Stay (HOSPITAL_COMMUNITY)

## 2023-07-03 ENCOUNTER — Other Ambulatory Visit: Payer: Self-pay

## 2023-07-03 ENCOUNTER — Inpatient Hospital Stay (HOSPITAL_COMMUNITY)
Admission: EM | Admit: 2023-07-03 | Discharge: 2023-07-08 | DRG: 871 | Disposition: A | Discharge status: Home Health | Attending: Internal Medicine | Admitting: Internal Medicine

## 2023-07-03 DIAGNOSIS — R6520 Severe sepsis without septic shock: Secondary | ICD-10-CM

## 2023-07-03 DIAGNOSIS — N1831 Chronic kidney disease, stage 3a: Secondary | ICD-10-CM

## 2023-07-03 DIAGNOSIS — Z7982 Long term (current) use of aspirin: Secondary | ICD-10-CM

## 2023-07-03 DIAGNOSIS — N179 Acute kidney failure, unspecified: Secondary | ICD-10-CM | POA: Diagnosis present

## 2023-07-03 DIAGNOSIS — G8929 Other chronic pain: Secondary | ICD-10-CM | POA: Diagnosis present

## 2023-07-03 DIAGNOSIS — E872 Acidosis, unspecified: Secondary | ICD-10-CM | POA: Diagnosis present

## 2023-07-03 DIAGNOSIS — Z85118 Personal history of other malignant neoplasm of bronchus and lung: Secondary | ICD-10-CM

## 2023-07-03 DIAGNOSIS — D631 Anemia in chronic kidney disease: Secondary | ICD-10-CM | POA: Diagnosis present

## 2023-07-03 DIAGNOSIS — J690 Pneumonitis due to inhalation of food and vomit: Secondary | ICD-10-CM

## 2023-07-03 DIAGNOSIS — Z87891 Personal history of nicotine dependence: Secondary | ICD-10-CM

## 2023-07-03 DIAGNOSIS — E7800 Pure hypercholesterolemia, unspecified: Secondary | ICD-10-CM | POA: Diagnosis present

## 2023-07-03 DIAGNOSIS — R4182 Altered mental status, unspecified: Secondary | ICD-10-CM | POA: Diagnosis not present

## 2023-07-03 DIAGNOSIS — Z1152 Encounter for screening for COVID-19: Secondary | ICD-10-CM

## 2023-07-03 DIAGNOSIS — Z6823 Body mass index (BMI) 23.0-23.9, adult: Secondary | ICD-10-CM

## 2023-07-03 DIAGNOSIS — Z800 Family history of malignant neoplasm of digestive organs: Secondary | ICD-10-CM

## 2023-07-03 DIAGNOSIS — I129 Hypertensive chronic kidney disease with stage 1 through stage 4 chronic kidney disease, or unspecified chronic kidney disease: Secondary | ICD-10-CM | POA: Diagnosis present

## 2023-07-03 DIAGNOSIS — J189 Pneumonia, unspecified organism: Principal | ICD-10-CM

## 2023-07-03 DIAGNOSIS — Z8679 Personal history of other diseases of the circulatory system: Secondary | ICD-10-CM

## 2023-07-03 DIAGNOSIS — I10 Essential (primary) hypertension: Secondary | ICD-10-CM

## 2023-07-03 DIAGNOSIS — I714 Abdominal aortic aneurysm, without rupture, unspecified: Secondary | ICD-10-CM

## 2023-07-03 DIAGNOSIS — G620 Drug-induced polyneuropathy: Secondary | ICD-10-CM | POA: Diagnosis not present

## 2023-07-03 DIAGNOSIS — E876 Hypokalemia: Secondary | ICD-10-CM | POA: Diagnosis not present

## 2023-07-03 DIAGNOSIS — M549 Dorsalgia, unspecified: Secondary | ICD-10-CM | POA: Diagnosis present

## 2023-07-03 DIAGNOSIS — Z7984 Long term (current) use of oral hypoglycemic drugs: Secondary | ICD-10-CM

## 2023-07-03 DIAGNOSIS — A419 Sepsis, unspecified organism: Principal | ICD-10-CM

## 2023-07-03 DIAGNOSIS — Z923 Personal history of irradiation: Secondary | ICD-10-CM

## 2023-07-03 DIAGNOSIS — J440 Chronic obstructive pulmonary disease with acute lower respiratory infection: Secondary | ICD-10-CM | POA: Diagnosis present

## 2023-07-03 DIAGNOSIS — T451X5A Adverse effect of antineoplastic and immunosuppressive drugs, initial encounter: Secondary | ICD-10-CM

## 2023-07-03 DIAGNOSIS — E440 Moderate protein-calorie malnutrition: Secondary | ICD-10-CM | POA: Insufficient documentation

## 2023-07-03 DIAGNOSIS — N183 Chronic kidney disease, stage 3 unspecified: Secondary | ICD-10-CM | POA: Diagnosis present

## 2023-07-03 DIAGNOSIS — E860 Dehydration: Secondary | ICD-10-CM | POA: Diagnosis present

## 2023-07-03 DIAGNOSIS — R5381 Other malaise: Secondary | ICD-10-CM

## 2023-07-03 DIAGNOSIS — J449 Chronic obstructive pulmonary disease, unspecified: Secondary | ICD-10-CM

## 2023-07-03 DIAGNOSIS — G9341 Metabolic encephalopathy: Secondary | ICD-10-CM

## 2023-07-03 DIAGNOSIS — Z79899 Other long term (current) drug therapy: Secondary | ICD-10-CM

## 2023-07-03 DIAGNOSIS — R296 Repeated falls: Secondary | ICD-10-CM | POA: Diagnosis present

## 2023-07-03 LAB — I-STAT VENOUS BLOOD GAS, ED
Acid-base deficit: 2 mmol/L (ref 0.0–2.0)
Bicarbonate: 23.5 mmol/L (ref 20.0–28.0)
Calcium, Ion: 1.26 mmol/L (ref 1.15–1.40)
HCT: 38 % — ABNORMAL LOW (ref 39.0–52.0)
Hemoglobin: 12.9 g/dL — ABNORMAL LOW (ref 13.0–17.0)
O2 Saturation: 90 %
Potassium: 3.6 mmol/L (ref 3.5–5.1)
Sodium: 136 mmol/L (ref 135–145)
TCO2: 25 mmol/L (ref 22–32)
pCO2, Ven: 41.3 mmHg — ABNORMAL LOW (ref 44–60)
pH, Ven: 7.363 (ref 7.25–7.43)
pO2, Ven: 61 mmHg — ABNORMAL HIGH (ref 32–45)

## 2023-07-03 LAB — URINALYSIS, W/ REFLEX TO CULTURE (INFECTION SUSPECTED)
Bacteria, UA: NONE SEEN
Bilirubin Urine: NEGATIVE
Glucose, UA: 50 mg/dL — AB
Ketones, ur: NEGATIVE mg/dL
Leukocytes,Ua: NEGATIVE
Nitrite: NEGATIVE
Protein, ur: 30 mg/dL — AB
Specific Gravity, Urine: 1.017 (ref 1.005–1.030)
pH: 5 (ref 5.0–8.0)

## 2023-07-03 LAB — CBC WITH DIFFERENTIAL/PLATELET
Abs Immature Granulocytes: 0.07 10*3/uL (ref 0.00–0.07)
Basophils Absolute: 0 10*3/uL (ref 0.0–0.1)
Basophils Relative: 0 %
Eosinophils Absolute: 0.2 10*3/uL (ref 0.0–0.5)
Eosinophils Relative: 2 %
HCT: 37.8 % — ABNORMAL LOW (ref 39.0–52.0)
Hemoglobin: 12.7 g/dL — ABNORMAL LOW (ref 13.0–17.0)
Immature Granulocytes: 1 %
Lymphocytes Relative: 8 %
Lymphs Abs: 0.8 10*3/uL (ref 0.7–4.0)
MCH: 31.1 pg (ref 26.0–34.0)
MCHC: 33.6 g/dL (ref 30.0–36.0)
MCV: 92.6 fL (ref 80.0–100.0)
Monocytes Absolute: 1.3 10*3/uL — ABNORMAL HIGH (ref 0.1–1.0)
Monocytes Relative: 13 %
Neutro Abs: 8.1 10*3/uL — ABNORMAL HIGH (ref 1.7–7.7)
Neutrophils Relative %: 76 %
Platelets: 135 10*3/uL — ABNORMAL LOW (ref 150–400)
RBC: 4.08 MIL/uL — ABNORMAL LOW (ref 4.22–5.81)
RDW: 13.9 % (ref 11.5–15.5)
WBC: 10.5 10*3/uL (ref 4.0–10.5)
nRBC: 0 % (ref 0.0–0.2)

## 2023-07-03 LAB — RESP PANEL BY RT-PCR (RSV, FLU A&B, COVID)  RVPGX2
Influenza A by PCR: NEGATIVE
Influenza B by PCR: NEGATIVE
Resp Syncytial Virus by PCR: NEGATIVE
SARS Coronavirus 2 by RT PCR: NEGATIVE

## 2023-07-03 LAB — I-STAT CG4 LACTIC ACID, ED
Lactic Acid, Venous: 1.7 mmol/L (ref 0.5–1.9)
Lactic Acid, Venous: 1.9 mmol/L (ref 0.5–1.9)
Lactic Acid, Venous: 2 mmol/L (ref 0.5–1.9)

## 2023-07-03 LAB — COMPREHENSIVE METABOLIC PANEL WITH GFR
ALT: 13 U/L (ref 0–44)
AST: 30 U/L (ref 15–41)
Albumin: 2.8 g/dL — ABNORMAL LOW (ref 3.5–5.0)
Alkaline Phosphatase: 57 U/L (ref 38–126)
Anion gap: 9 (ref 5–15)
BUN: 14 mg/dL (ref 8–23)
CO2: 22 mmol/L (ref 22–32)
Calcium: 9.2 mg/dL (ref 8.9–10.3)
Chloride: 102 mmol/L (ref 98–111)
Creatinine, Ser: 1.5 mg/dL — ABNORMAL HIGH (ref 0.61–1.24)
GFR, Estimated: 45 mL/min — ABNORMAL LOW (ref >60)
Glucose, Bld: 125 mg/dL — ABNORMAL HIGH (ref 70–99)
Potassium: 3.6 mmol/L (ref 3.5–5.1)
Sodium: 133 mmol/L — ABNORMAL LOW (ref 135–145)
Total Bilirubin: 1.1 mg/dL (ref 0.0–1.2)
Total Protein: 7.1 g/dL (ref 6.5–8.1)

## 2023-07-03 LAB — HIV ANTIBODY (ROUTINE TESTING W REFLEX): HIV Screen 4th Generation wRfx: NONREACTIVE

## 2023-07-03 LAB — CK: Total CK: 289 U/L (ref 49–397)

## 2023-07-03 LAB — PROTIME-INR
INR: 1.2 (ref 0.8–1.2)
Prothrombin Time: 15.8 s — ABNORMAL HIGH (ref 11.4–15.2)

## 2023-07-03 LAB — OSMOLALITY, URINE: Osmolality, Ur: 353 mosm/kg (ref 300–900)

## 2023-07-03 LAB — PROCALCITONIN: Procalcitonin: 0.54 ng/mL

## 2023-07-03 LAB — AMMONIA: Ammonia: 20 umol/L (ref 9–35)

## 2023-07-03 LAB — TSH: TSH: 0.696 u[IU]/mL (ref 0.350–4.500)

## 2023-07-03 LAB — PHOSPHORUS: Phosphorus: 2.1 mg/dL — ABNORMAL LOW (ref 2.5–4.6)

## 2023-07-03 LAB — CREATININE, URINE, RANDOM: Creatinine, Urine: 50 mg/dL

## 2023-07-03 LAB — LACTIC ACID, PLASMA: Lactic Acid, Venous: 2.1 mmol/L (ref 0.5–1.9)

## 2023-07-03 LAB — OSMOLALITY: Osmolality: 286 mosm/kg (ref 275–295)

## 2023-07-03 LAB — SODIUM, URINE, RANDOM: Sodium, Ur: 85 mmol/L

## 2023-07-03 LAB — STREP PNEUMONIAE URINARY ANTIGEN: Strep Pneumo Urinary Antigen: NEGATIVE

## 2023-07-03 LAB — MAGNESIUM: Magnesium: 2 mg/dL (ref 1.7–2.4)

## 2023-07-03 MED ORDER — SODIUM CHLORIDE 0.9 % IV SOLN
2.0000 g | Freq: Once | INTRAVENOUS | Status: AC
  Administered 2023-07-03: 2 g via INTRAVENOUS
  Filled 2023-07-03: qty 12.5

## 2023-07-03 MED ORDER — LACTATED RINGERS IV BOLUS (SEPSIS)
1000.0000 mL | Freq: Once | INTRAVENOUS | Status: AC
  Administered 2023-07-03: 1000 mL via INTRAVENOUS

## 2023-07-03 MED ORDER — SODIUM CHLORIDE 0.9 % IV SOLN: INTRAVENOUS | Status: DC

## 2023-07-03 MED ORDER — VANCOMYCIN HCL IN DEXTROSE 1-5 GM/200ML-% IV SOLN: 1000.0000 mg | Freq: Once | INTRAVENOUS | Status: DC

## 2023-07-03 MED ORDER — METRONIDAZOLE 500 MG/100ML IV SOLN
500.0000 mg | Freq: Once | INTRAVENOUS | Status: AC
  Administered 2023-07-03: 500 mg via INTRAVENOUS
  Filled 2023-07-03: qty 100

## 2023-07-03 MED ORDER — SODIUM CHLORIDE 0.9 % IV SOLN: 500.0000 mg | INTRAVENOUS | Status: DC

## 2023-07-03 MED ORDER — ACETAMINOPHEN 325 MG PO TABS
650.0000 mg | ORAL_TABLET | Freq: Once | ORAL | Status: AC
  Administered 2023-07-03: 650 mg via ORAL
  Filled 2023-07-03: qty 2

## 2023-07-03 MED ORDER — LACTATED RINGERS IV SOLN: INTRAVENOUS | Status: DC

## 2023-07-03 MED ORDER — VANCOMYCIN HCL 1500 MG/300ML IV SOLN
1500.0000 mg | Freq: Once | INTRAVENOUS | Status: AC
  Administered 2023-07-03: 1500 mg via INTRAVENOUS
  Filled 2023-07-03: qty 300

## 2023-07-03 MED ORDER — POTASSIUM PHOSPHATES 15 MMOLE/5ML IV SOLN
15.0000 mmol | Freq: Once | INTRAVENOUS | Status: AC
  Administered 2023-07-03: 15 mmol via INTRAVENOUS
  Filled 2023-07-03: qty 5

## 2023-07-03 MED ORDER — SODIUM CHLORIDE 0.9 % IV SOLN
3.0000 g | Freq: Four times a day (QID) | INTRAVENOUS | Status: DC
  Administered 2023-07-03 – 2023-07-08 (×19): 3 g via INTRAVENOUS
  Filled 2023-07-03 (×20): qty 8

## 2023-07-03 MED ORDER — LACTATED RINGERS IV BOLUS (SEPSIS)
250.0000 mL | Freq: Once | INTRAVENOUS | Status: AC
  Administered 2023-07-03: 250 mL via INTRAVENOUS

## 2023-07-03 NOTE — Assessment & Plan Note (Signed)
-  SIRS criteria met with      Component Value Date/Time   WBC 10.5 07/03/2023 1422   LYMPHSABS 0.8 07/03/2023 1422    fever   RR >20 Today's Vitals   07/03/23 1800 07/03/23 1815 07/03/23 1830 07/03/23 1847  BP: 109/65 110/68 104/61   Pulse: 89 89 82   Resp:      Temp:    98.4 F (36.9 C)  TempSrc:    Oral  SpO2: 96% 96% 97%     -Most likely source being:  pulmonary    Patient meeting criteria for Severe sepsis with    evidence of end organ damage/organ dysfunction such as    elevated lactic acid >2     Component Value Date/Time   LATICACIDVEN 2.0 (HH) 07/03/2023 1615    acute metabolic encephalopathy     Antibiotics Given (last 72 hours)     Date/Time Action Medication Dose Rate   07/03/23 1442 New Bag/Given   metroNIDAZOLE (FLAGYL) IVPB 500 mg 500 mg 100 mL/hr   07/03/23 1444 New Bag/Given   ceFEPIme (MAXIPIME) 2 g in sodium chloride  0.9 % 100 mL IVPB 2 g 200 mL/hr   07/03/23 1541 New Bag/Given   vancomycin (VANCOREADY) IVPB 1500 mg/300 mL 1,500 mg 150 mL/hr       Will continue  on : Unasyn and azithromycin   - await results of blood and urine culture  - Rehydrate aggressively  Intravenous fluids were administered,       7:34 PM

## 2023-07-03 NOTE — Assessment & Plan Note (Signed)
-  chronic avoid nephrotoxic medications such as NSAIDs, Vanco Zosyn combo,  avoid hypotension, continue to follow renal function Evidence of acute on chronic renal failure.  Will rehydrate obtain electrolytes

## 2023-07-03 NOTE — ED Triage Notes (Signed)
 Pt BIB [facility] From home initially for fall. No injuries. Wife reported the pt has been altered since Wednesday. Hot to touch. Baseline A&O X4.   Bp 116/74 Hr 110  Rr 26  Spo2 98% ra  cbg 150

## 2023-07-03 NOTE — Assessment & Plan Note (Addendum)
 Pt is not on home inhales Order albuterol  as needed and scheduled DuoNeb

## 2023-07-03 NOTE — ED Notes (Signed)
Pt aware we need a urine sample. 

## 2023-07-03 NOTE — Assessment & Plan Note (Addendum)
 Cover with Unasyn and add azithromycin  - -Patient presenting with  productive cough, fever     , and infiltrate in   lower lobe on chest x-ray -Infiltrate on CXR and 2-3 characteristics (fever, leukocytosis, purulent sputum) are consistent with pneumonia. -This appears to be most likely community-acquired pneumonia.   -  Suspect aspiration given decreased mental status      will admit for treatment of CAP will start on appropriate antibiotic coverage. - unasyn/azithromycin   Obtain:  sputum cultures,                                  influenza serologies negative                  COVID PCR negative                    blood cultures and sputum cultures ordered                   strep pneumo UA antigen,                    check for Legionella antigen.                Provide oxygen as needed.

## 2023-07-03 NOTE — Assessment & Plan Note (Signed)
 Allow permissive HNT given hypotension

## 2023-07-03 NOTE — H&P (Signed)
 Danny [facility]:[id number] DOB: Oct 05, 1935 DOA: 07/03/2023     PCP: Danny Idol, NP   Outpatient Specialists:      NEphrology: Danny [facility]    Oncology   Dr.[facility]    Patient arrived to ER on 07/03/23 at 1327 Referred by Attending Danny Creamer, DO   Patient coming from:    home Lives     With family      Chief Complaint:   Chief Complaint  Patient presents with   Altered Mental Status    HPI: Danny [facility] is a 88 y.o. male with medical history significant of COPD, AAA, CKD, HLD, HTN, recurrent non-small cell lung cancer.     Presented with   confusion Comes in from home for fall wife reported patient has been confused ever since Wednesday he has been having some fevers at home.  Blood pressure on arrival 116/74 heart rate 110 Prior to this patient was seen in the emergency department about 3 days ago for a fall back then thought to be secondary to generalized fatigue. Endorsing urinary frequency Patient did not strike head Patient denied any chest pain shortness of breath Any was able to obtain 1 set of blood cultures patient is a hard stick Chest x-ray concerning for pneumonia on the right in the lower lung space Patient initially started on broad-spectrum antibiotics including cefepime Vanco and Flagyl  CT chest abdomen concerning for pneumonia possible aspiration evidence of COPD    Has been generally weak decreased pO intake deneis localized weakness himself with family feels that maybe his left leg was weaker than his right.  They are interested in MRI to evaluate for CVA On physical exam currently appears to be generally weak with no localized weakness   the patient's symptoms have been coming and going and he seems to be progressively having more falls    Denies significant ETOH intake   Does not smoke   Denies marijuana use      Regarding pertinent Chronic problems:     Hyperlipidemia - on statins Zocor  (simvastatin )  Lipid Panel  No  results found for: "CHOL", "TRIG", "HDL", "CHOLHDL", "VLDL", "LDLCALC", "LDLDIRECT", "LABVLDL"   HTN on NOrvasc , toporl                        COPD - not  followed by pulmonology   not  on baseline oxygen     Not officially diagnosed with OSa  but does snore     CKD stage IIIA  baseline Cr 1.0 Estimated Creatinine Clearance: 34.8 mL/min (A) (by C-G formula based on SCr of 1.5 mg/dL (H)).  Lab Results  Component Value Date   CREATININE 1.50 (H) 07/03/2023   CREATININE 1.37 (H) 06/30/2023   CREATININE 1.04 03/13/2023   Lab Results  Component Value Date   NA 136 07/03/2023   CL 102 07/03/2023   K 3.6 07/03/2023   CO2 22 07/03/2023   BUN 14 07/03/2023   CREATININE 1.50 (H) 07/03/2023   GFRNONAA 45 (L) 07/03/2023   CALCIUM 9.2 07/03/2023   ALBUMIN  2.8 (L) 07/03/2023   GLUCOSE 125 (H) 07/03/2023          Chronic anemia - baseline hg Hemoglobin & Hematocrit  Recent Labs    06/30/23 1239 07/03/23 1422 07/03/23 1517  HGB 12.8* 12.7* 12.9*   Iron/TIBC/Ferritin/ %Sat No results found for: "IRON", "TIBC", "FERRITIN", "IRONPCTSAT"   Cancer:Recurrent non-small cell lung cancer, squamous cell carcinoma  presented with right lower lobe lung nodule   Followed by Dr. Liam [facility] status post right middle lobectomy adjuvant systemic chemotherapy Followed by curative radiotherapy finishing 2021 currently being observed  While in ER: Clinical Course as of 07/03/23 1915  Sun Jul 03, 2023  1914 hosp [MP]    Clinical Course User Index [MP] Danny Creamer, DO       Lab Orders         Resp panel by RT-PCR (RSV, Flu A&B, Covid) Anterior Nasal Swab         Blood Culture (routine x 2)         Comprehensive metabolic panel         CBC with Differential         Protime-INR         Urinalysis, w/ Reflex to Culture (Infection Suspected) -Urine, Clean Catch         Magnesium          I-Stat Lactic Acid, ED         I-Stat venous blood gas, ED ([facility])         I-Stat CG4 Lactic Acid,  ED      CT HEAD   NON acute   MRI brain  ordered  CXR - Progressive right mid and lower lung airspace disease/pneumonia,   CTabd/pelvis -  nonacute  CTA chest -  ASPIRATION PNEUMONIA evidence of infiltrate  Following Medications were ordered in ER: Medications  lactated ringers  infusion ( Intravenous New Bag/Given 07/03/23 1711)  lactated ringers  bolus 1,000 mL (0 mLs Intravenous Stopped 07/03/23 1542)    And  lactated ringers  bolus 1,000 mL (0 mLs Intravenous Stopped 07/03/23 1615)    And  lactated ringers  bolus 250 mL (0 mLs Intravenous Stopped 07/03/23 1711)  ceFEPIme (MAXIPIME) 2 g in sodium chloride  0.9 % 100 mL IVPB (0 g Intravenous Stopped 07/03/23 1523)  metroNIDAZOLE (FLAGYL) IVPB 500 mg (0 mg Intravenous Stopped 07/03/23 1542)  vancomycin (VANCOREADY) IVPB 1500 mg/300 mL (0 mg Intravenous Stopped 07/03/23 1807)  acetaminophen  (TYLENOL ) tablet 650 mg (650 mg Oral Given 07/03/23 1459)    ___    ED Triage Vitals [07/03/23 1339]  Encounter Vitals Group     BP 122/72     Systolic BP Percentile      Diastolic BP Percentile      Pulse Rate (!) 109     Resp 18     Temp 99.1 F (37.3 C)     Temp Source Oral     SpO2 96 %     Weight      Height      Head Circumference      Peak Flow      Pain Score      Pain Loc      Pain Education      Exclude from Growth Chart   TMAX(24)@     _________________________________________ Significant initial  Findings: Abnormal Labs Reviewed  COMPREHENSIVE METABOLIC PANEL WITH GFR - Abnormal; Notable for the following components:      Result Value   Sodium 133 (*)    Glucose, Bld 125 (*)    Creatinine, Ser 1.50 (*)    Albumin  2.8 (*)    GFR, Estimated 45 (*)    All other components within normal limits  CBC WITH DIFFERENTIAL/PLATELET - Abnormal; Notable for the following components:   RBC 4.08 (*)    Hemoglobin 12.7 (*)    HCT 37.8 (*)    Platelets 135 (*)  Neutro Abs 8.1 (*)    Monocytes Absolute 1.3 (*)    All other  components within normal limits  PROTIME-INR - Abnormal; Notable for the following components:   Prothrombin Time 15.8 (*)    All other components within normal limits  URINALYSIS, W/ REFLEX TO CULTURE (INFECTION SUSPECTED) - Abnormal; Notable for the following components:   Glucose, UA 50 (*)    Hgb urine dipstick MODERATE (*)    Protein, ur 30 (*)    All other components within normal limits  I-STAT VENOUS BLOOD GAS, ED - Abnormal; Notable for the following components:   pCO2, Ven 41.3 (*)    pO2, Ven 61 (*)    HCT 38.0 (*)    Hemoglobin 12.9 (*)    All other components within normal limits  I-STAT CG4 LACTIC ACID, ED - Abnormal; Notable for the following components:   Lactic Acid, Venous 2.0 (*)    All other components within normal limits        ECG: Ordered Personally reviewed and interpreted by me showing: HR : 108 Rhythm: Sinus tachycardia Abnormal R-wave progression, early transition Borderline T wave abnormalities QTC 411  BNP (last 3 results) Recent Labs    10/04/22 1303 03/13/23 1730  BNP 88.7 31.2       The recent clinical data is shown below. Vitals:   07/03/23 1800 07/03/23 1815 07/03/23 1830 07/03/23 1847  BP: 109/65 110/68 104/61   Pulse: 89 89 82   Resp:      Temp:    98.4 F (36.9 C)  TempSrc:    Oral  SpO2: 96% 96% 97%     WBC     Component Value Date/Time   WBC 10.5 07/03/2023 1422   LYMPHSABS 0.8 07/03/2023 1422   MONOABS 1.3 (H) 07/03/2023 1422   EOSABS 0.2 07/03/2023 1422   BASOSABS 0.0 07/03/2023 1422     Lactic Acid, Venous    Component Value Date/Time   LATICACIDVEN 2.0 (HH) 07/03/2023 1615    Procalcitonin   Ordered      UA   no evidence of UTI     Urine analysis:    Component Value Date/Time   COLORURINE YELLOW 07/03/2023 1540   APPEARANCEUR CLEAR 07/03/2023 1540   LABSPEC 1.017 07/03/2023 1540   PHURINE 5.0 07/03/2023 1540   GLUCOSEU 50 (A) 07/03/2023 1540   HGBUR MODERATE (A) 07/03/2023 1540   BILIRUBINUR  NEGATIVE 07/03/2023 1540   KETONESUR NEGATIVE 07/03/2023 1540   PROTEINUR 30 (A) 07/03/2023 1540   NITRITE NEGATIVE 07/03/2023 1540   LEUKOCYTESUR NEGATIVE 07/03/2023 1540    Results for orders placed or performed during the [facility] encounter of 07/03/23  Resp panel by RT-PCR (RSV, Flu A&B, Covid) Anterior Nasal Swab     Status: None   Collection Time: 07/03/23  1:39 PM   Specimen: Anterior Nasal Swab  Result Value Ref Range Status   SARS Coronavirus 2 by RT PCR NEGATIVE NEGATIVE Final   Influenza A by PCR NEGATIVE NEGATIVE Final   Influenza B by PCR NEGATIVE NEGATIVE Final         Resp Syncytial Virus by PCR NEGATIVE NEGATIVE Final          ABX started Antibiotics Given (last 72 hours)     Date/Time Action Medication Dose Rate   07/03/23 1442 New Bag/Given   metroNIDAZOLE (FLAGYL) IVPB 500 mg 500 mg 100 mL/hr   07/03/23 1444 New Bag/Given   ceFEPIme (MAXIPIME) 2 g in sodium chloride  0.9 %  100 mL IVPB 2 g 200 mL/hr   07/03/23 1541 New Bag/Given   vancomycin (VANCOREADY) IVPB 1500 mg/300 mL 1,500 mg 150 mL/hr       ________________________________________________________________   Venous  Blood Gas result:  pH  7.363 Sodium 136 mmol/L   pCO2, Ven 41.3 Low  mmHg Potassium 3.6 mmol/L  pO2, Ven 61 High  mmHg        __________________________________________________________ Recent Labs  Lab 06/30/23 1239 07/03/23 1422 07/03/23 1517  NA 135 133* 136  K 3.9 3.6 3.6  CO2 22 22  --   GLUCOSE 130* 125*  --   BUN 19 14  --   CREATININE 1.37* 1.50*  --   CALCIUM 9.2 9.2  --   MG  --  2.0  --     Cr    Up from baseline see below Lab Results  Component Value Date   CREATININE 1.50 (H) 07/03/2023   CREATININE 1.37 (H) 06/30/2023   CREATININE 1.04 03/13/2023    Recent Labs  Lab 06/30/23 1239 07/03/23 1422  AST 20 30  ALT 11 13  ALKPHOS 56 57  BILITOT 1.2 1.1  PROT 7.4 7.1  ALBUMIN  3.5 2.8*   Lab Results  Component Value Date   CALCIUM 9.2 07/03/2023     Plt: Lab Results  Component Value Date   PLT 135 (L) 07/03/2023         Recent Labs  Lab 06/30/23 1239 07/03/23 1422 07/03/23 1517  WBC 9.8 10.5  --   NEUTROABS 8.3* 8.1*  --   HGB 12.8* 12.7* 12.9*  HCT 39.0 37.8* 38.0*  MCV 94.2 92.6  --   PLT 106* 135*  --     HG/HCT  stable,      Component Value Date/Time   HGB 12.9 (L) 07/03/2023 1517   HGB 13.1 03/04/2023 0954   HCT 38.0 (L) 07/03/2023 1517   MCV 92.6 07/03/2023 1422        _______________________________________________ Hospitalist was called for admission for   Pneumonia of right lower lobe worrisome for aspiration Sepsis with encephalopathy without septic shock       The following Work up has been ordered so far:  Orders Placed This Encounter  Procedures   Resp panel by RT-PCR (RSV, Flu A&B, Covid) Anterior Nasal Swab   Blood Culture (routine x 2)   DG Chest Port 1 View   CT Head Wo Contrast   CT CHEST ABDOMEN PELVIS WO CONTRAST   Comprehensive metabolic panel   CBC with Differential   Protime-INR   Urinalysis, w/ Reflex to Culture (Infection Suspected) -Urine, Clean Catch   Magnesium    Diet NPO time specified   Document height and weight   Assess and Document Glasgow Coma Scale   Document vital signs within 1-hour of fluid bolus completion. Notify provider of abnormal vital signs despite fluid resuscitation.   DO NOT delay antibiotics if unable to obtain blood culture.   Refer to Sidebar Report: Sepsis Sidebar ED/IP   Notify provider for difficulties obtaining IV access.   Insert peripheral IV x 2   Initiate Carrier Fluid Protocol   Check Rectal Temperature   Code Sepsis activation.  This occurs automatically when order is signed and prioritizes pharmacy, lab, and radiology services for STAT collections and interventions.  If [facility] downtime, call [facility] [phone]) to activate Code Sepsis.   monitoring by pharmacy   Consult to hospitalist   I-Stat Lactic Acid, ED   I-Stat venous blood  gas,  ED ([facility])   I-Stat CG4 Lactic Acid, ED   ED EKG   EKG 12-Lead     OTHER Significant initial  Findings:  labs showing:     DM  labs:  HbA1C: No results for input(s): "HGBA1C" in the last 8760 hours.     CBG (last 3)  No results for input(s): "GLUCAP" in the last 72 hours.        Cultures:    Component Value Date/Time   SDES  09/24/2022 1430    LEFT ANTECUBITAL Performed at [facility], [address] [place], [place] [address]    [facility]  09/24/2022 1430    BOTTLES DRAWN AEROBIC AND ANAEROBIC Blood Culture results may not be optimal due to an excessive volume of blood received in culture bottles Performed at [facility], [address] [place], [place] [address]    CULT  09/24/2022 1430    NO GROWTH 5 DAYS Performed at [facility], [address] [place], [place] [address]    REPTSTATUS 09/29/2022 FINAL 09/24/2022 1430     Radiological Exams on Admission: CT Head Wo Contrast Result Date: 07/03/2023 CLINICAL DATA:  Mental status change of unknown cause EXAM: CT HEAD WITHOUT CONTRAST TECHNIQUE: Contiguous axial images were obtained from the base of the skull through the vertex without intravenous contrast. RADIATION DOSE REDUCTION: This exam was performed according to the departmental dose-optimization program which includes automated exposure control, adjustment of the mA and/or kV according to patient size and/or use of iterative reconstruction technique. COMPARISON:  06/30/2023 CT.  MRI 10/15/2019. FINDINGS: Brain: No change or acute CT finding. No focal abnormality visible affecting the brainstem or cerebellum. Cerebral hemispheres show chronic small-vessel ischemic changes of the thalami and cerebral hemispheric white matter. No large vessel territory stroke. No mass lesion, hemorrhage, hydrocephalus or extra-axial collection. Mild ventricular prominence is probably secondary to central atrophy. Vascular: There  is atherosclerotic calcification of the major vessels at the base of the brain. Skull: Negative Sinuses/Orbits: Clear/normal Other: None IMPRESSION: No acute CT finding. Chronic small-vessel ischemic changes of the thalami and cerebral hemispheric white matter. Mild ventricular prominence probably secondary to central atrophy. Electronically Signed   By: Bettylou Brunner M.D.   On: 07/03/2023 17:42   CT CHEST ABDOMEN PELVIS WO CONTRAST Result Date: 07/03/2023 CLINICAL DATA:  Sepsis EXAM: CT CHEST, ABDOMEN AND PELVIS WITHOUT CONTRAST TECHNIQUE: Multidetector CT imaging of the chest, abdomen and pelvis was performed following the standard protocol without IV contrast. RADIATION DOSE REDUCTION: This exam was performed according to the departmental dose-optimization program which includes automated exposure control, adjustment of the mA and/or kV according to patient size and/or use of iterative reconstruction technique. COMPARISON:  07/03/2023, 03/13/2023 FINDINGS: CT CHEST FINDINGS Cardiovascular: Unenhanced imaging of the heart is unremarkable without pericardial effusion. Normal caliber of the thoracic aorta. Atherosclerosis of the aorta and coronary vasculature. Assessment of the vascular lumen cannot be performed without IV contrast. Mediastinum/Nodes: Stable calcified mediastinal and hilar lymph nodes. Thyroid, trachea, and esophagus are grossly unremarkable. Lungs/Pleura: Emphysema, with biapical pleural and parenchymal scarring again noted. Bullous changes are again seen within the right lower lobe, with superimposed airspace disease that has developed since prior study consistent with right lower lobe pneumonia or aspiration. Peripheral subpleural radiation fibrosis within the right lower lobe reference image 28/5. Trace right pleural effusion. No pneumothorax. Central airways are patent. Musculoskeletal: Stable right posterior seventh rib fracture, compatible with insufficiency fracture given adjacent post  radiation change. No acute bony  abnormalities. Reconstructed images demonstrate no additional findings. CT ABDOMEN PELVIS FINDINGS Hepatobiliary: Subcentimeter hepatic cysts. Otherwise unremarkable unenhanced appearance of the liver and gallbladder. Pancreas: Unremarkable unenhanced appearance. Spleen: Unremarkable unenhanced appearance. Adrenals/Urinary Tract: Stable bilateral renal cortical cysts, which do not require specific imaging follow-up. No urinary tract calculi or obstructive uropathy within either kidney. Bladder is minimally distended, limiting its evaluation. No gross bladder abnormalities. The adrenals are stable. Stomach/Bowel: No bowel obstruction or ileus. Normal appendix right lower quadrant. Scattered colonic diverticulosis without diverticulitis. No bowel wall thickening or inflammatory change. Vascular/Lymphatic: Endoluminal stent graft within the distal aorta and bilateral iliac arteries. 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 Assessment of the vascular lumen cannot be performed without IV contrast. Atherosclerosis of the aorta and its distal branches. No pathologic adenopathy. Reproductive: Stable enlargement of the prostate. Other: No free fluid or free intraperitoneal gas. No abdominal wall hernia. Musculoskeletal: No acute or destructive bony abnormalities. Reconstructed images demonstrate no additional findings. IMPRESSION: Chest: 1. New right lower lobe airspace disease superimposed upon chronic emphysema and bullous change, consistent with infection or aspiration. Trace right pleural effusion. 2. Stable emphysema with biapical pleural and parenchymal scarring. Stable post radiation fibrosis within the subpleural region right lower lobe posteriorly. 3. Aortic Atherosclerosis (ICD10-I70.0). Coronary artery atherosclerosis. 4. Stable calcified mediastinal and hilar lymph nodes. Abdomen/pelvis: 1. No acute intra-abdominal or intrapelvic process. 2. Endoluminal stent  graft within the aorta and bilateral common iliac arteries, with stable left common iliac artery aneurysm. Assessment of the vascular lumen cannot be performed without IV contrast. 3.  Aortic Atherosclerosis (ICD10-I70.0). Electronically Signed   By: Bobbye Burrow M.D.   On: 07/03/2023 17:42   DG Chest Port 1 View Result Date: 07/03/2023 CLINICAL DATA:  Possible sepsis. EXAM: PORTABLE CHEST 1 VIEW COMPARISON:  06/30/2023 chest radiograph.  Chest CT of 03/13/2023 FINDINGS: Single AP view of the chest with patient moderately rotated to the right. Numerous leads and wires project over the chest. Tracheal deviation right secondary to obliquity and volume loss in the right hemithorax. Normal heart size. Trace right pleural fluid or thickening. No pneumothorax. Emphysema, with primarily right-sided pulmonary interstitial thickening. Extensive partially treatment related volume loss and architectural distortion throughout the right hemithorax. When compared to 03/13/2023, superimposed right mid and lower lung airspace disease, increased since 06/30/2023. Biapical pleuroparenchymal scarring, greater right than left. IMPRESSION: Progressive right mid and lower lung airspace disease/pneumonia, superimposed upon chronic architectural distortion and interstitial thickening. Emphysema (ICD10-J43.9). Electronically Signed   By: Lore Rode M.D.   On: 07/03/2023 14:52   _______________________________________________________________________________________________________ Latest  Blood pressure 104/61, pulse 82, temperature 98.4 F (36.9 C), temperature source Oral, resp. rate 15, SpO2 97%.   Vitals  labs and radiology finding personally reviewed  Review of Systems:    Pertinent positives include:   Fevers, chills, fatigue, falls  Constitutional:  No weight loss, night sweats,e, weight loss  HEENT:  No headaches, Difficulty swallowing,Tooth/dental problems,Sore throat,  No sneezing, itching, ear ache, nasal  congestion, post nasal drip,  Cardio-vascular:  No chest pain, Orthopnea, PND, anasarca, dizziness, palpitations.no Bilateral lower extremity swelling  GI:  No heartburn, indigestion, abdominal pain, nausea, vomiting, diarrhea, change in bowel habits, loss of appetite, melena, blood in stool, hematemesis Resp:  no shortness of breath at rest. No dyspnea on exertion, No excess mucus, no productive cough, No non-productive cough, No coughing up of blood.No change in color of mucus.No wheezing. Skin:  no rash or lesions. No jaundice GU:  no dysuria,  change in color of urine, no urgency or frequency. No straining to urinate.  No flank pain.  Musculoskeletal:  No joint pain or no joint swelling. No decreased range of motion. No back pain.  Psych:  No change in mood or affect. No depression or anxiety. No memory loss.  Neuro: no localizing neurological complaints, no tingling, no weakness, no double vision, no gait abnormality, no slurred speech, no confusion  All systems reviewed and apart from HOPI all are negative _______________________________________________________________________________________________ Past Medical History:   Past Medical History:  Diagnosis Date   AAA (abdominal aortic aneurysm) (HCC)    Chronic back pain    Chronic kidney disease    Hypercholesterolemia    Hypertension    Right Lung dx'd 09/2017   s/p RMLobectomy 2019 and adjuvant chemotherapy and SBRT      Past Surgical History:  Procedure Laterality Date   ABDOMINAL AORTIC ENDOVASCULAR STENT GRAFT Right 02/03/2022   Procedure: ABDOMINAL AORTIC ENDOVASCULAR STENT GRAFT;  Surgeon: Margherita Shell, MD;  Location: [facility] OR;  Service: Vascular;  Laterality: Right;   BRONCHIAL NEEDLE ASPIRATION BIOPSY  08/05/2017   Procedure: BRONCHIAL NEEDLE ASPIRATION BIOPSIES;  Surgeon: Marine Sia, MD;  Location: [facility];  Service: Cardiopulmonary;;   ENDOBRONCHIAL ULTRASOUND Bilateral 08/05/2017   Procedure:  ENDOBRONCHIAL ULTRASOUND;  Surgeon: Marine Sia, MD;  Location: [facility];  Service: Cardiopulmonary;  Laterality: Bilateral;   IR IMAGING GUIDED PORT INSERTION  12/27/2017   IR REMOVAL TUN ACCESS W/ PORT W/O FL MOD SED  03/25/2020   MEDIASTINOSCOPY N/A 09/19/2017   Procedure: MEDIASTINOSCOPY;  Surgeon: Zelphia Higashi, MD;  Location: [facility] OR;  Service: Thoracic;  Laterality: N/A;   MULTIPLE TOOTH EXTRACTIONS     VIDEO ASSISTED THORACOSCOPY (VATS)/ LOBECTOMY Right 09/19/2017   Procedure: RIGHT VIDEO ASSISTED THORACOSCOPY (VATS)/ LOBECTOMY;  Surgeon: Zelphia Higashi, MD;  Location: [facility] OR;  Service: Thoracic;  Laterality: Right;    Social History:  Ambulatory   independently     reports that he quit smoking about 26 years ago. His smoking use included cigarettes. He started smoking about 66 years ago. He has a 20 pack-year smoking history. He has never used smokeless tobacco. He reports that he does not drink alcohol and does not use drugs.     Family History:   Family History  Problem Relation Age of Onset   Colon cancer Brother    Stomach cancer Brother    ______________________________________________________________________________________________ Allergies: No Known Allergies   Prior to Admission medications   Medication Sig Start Date End Date Taking? Authorizing Provider  acetaminophen  (TYLENOL ) 500 MG tablet Take 1,000 mg by mouth every 6 (six) hours as needed for moderate pain or headache.   Yes [provider]  allopurinol  (ZYLOPRIM ) 100 MG tablet Take 100 mg by mouth daily. 10/23/21  Yes [provider]  amLODipine  (NORVASC ) 10 MG tablet Take 10 mg by mouth daily.   Yes [provider]  aspirin  EC 81 MG tablet Take 81 mg by mouth daily.   Yes [provider]  DULoxetine  (CYMBALTA ) 60 MG capsule TAKE 1 CAPSULE(60 MG) BY MOUTH DAILY Patient taking differently: Take 60 mg by mouth in the morning. 02/14/23  Yes Vaslow,  Zachary K, MD  FARXIGA  10 MG TABS tablet Take 10 mg by mouth daily. 01/19/22  Yes [provider]  gabapentin  (NEURONTIN ) 300 MG capsule TAKE 3 CAPSULES(900 MG) BY MOUTH THREE TIMES DAILY Patient taking differently: Take 900 mg by mouth 3 (three) times  daily. 10/06/20  Yes Vaslow, Zachary K, MD  metoprolol  succinate (TOPROL -XL) 25 MG 24 hr tablet Take 1 tablet (25 mg total) by mouth daily. 09/26/17  Yes Gold, Wayne E, PA-C  omeprazole (PRILOSEC) 40 MG capsule Take 40 mg by mouth daily before breakfast.   Yes [provider]  Oxycodone  HCl 20 MG TABS Take 1 tablet by mouth every 6 (six) hours as needed. 06/27/23  Yes [provider]  simvastatin  (ZOCOR ) 20 MG tablet Take 20 mg by mouth daily.   Yes [provider]    ___________________________________________________________________________________________________ Physical Exam:    07/03/2023    6:30 PM 07/03/2023    6:15 PM 07/03/2023    6:00 PM  Vitals with BMI  Systolic 104 110 161  Diastolic 61 68 65  Pulse 82 89 89     1. General:  in No  Acute distress    Chronically ill   -appearing 2. Psychological: Alert and   Oriented to self 3. Head/ENT:    Dry Mucous Membranes                          Head Non traumatic, neck supple                          Poor Dentition 4. SKIN: decreased Skin turgor,  Skin clean Dry and intact no rash    5. Heart: Regular rate and rhythm no  Murmur, no Rub or gallop 6. Lungs:  , no wheezes some crackles   7. Abdomen: Soft,  non-tender, Non distended bowel sounds present 8. Lower extremities: no clubbing, cyanosis, no  edema 9. Neurologically strength 5 out of 5 in all 4 extremities cranial nerves II through XII intact 10. MSK: Normal range of motion    Chart has been reviewed  ______________________________________________________________________________________________  Assessment/Plan 88 y.o. male with medical history significant of COPD, AAA, CKD, HLD, HTN,  recurrent non-small cell lung cancer.   Admitted for     Pneumonia of right lower lobe worrisome for aspiration Sepsis with encephalopathy without septic shock      Present on Admission:  Sepsis (HCC)  Aspiration pneumonia (HCC)  Chemotherapy-induced neuropathy (HCC)  COPD GOLD I   Essential hypertension  Debility  AAA (abdominal aortic aneurysm) (HCC)  Acute metabolic encephalopathy  CKD (chronic kidney disease) stage 3, GFR 30-59 ml/min (HCC)     Aspiration pneumonia (HCC) Cover with Unasyn and add azithromycin  - -Patient presenting with  productive cough, fever     , and infiltrate in   lower lobe on chest x-ray -Infiltrate on CXR and 2-3 characteristics (fever, leukocytosis, purulent sputum) are consistent with pneumonia. -This appears to be most likely community-acquired pneumonia.   -  Suspect aspiration given decreased mental status      will admit for treatment of CAP will start on appropriate antibiotic coverage. - unasyn/azithromycin   Obtain:  sputum cultures,                                  influenza serologies negative                  COVID PCR negative                    blood cultures and sputum cultures ordered  strep pneumo UA antigen,                    check for Legionella antigen.                Provide oxygen as needed.    Chemotherapy-induced neuropathy (HCC) Continue on Neurontin   COPD GOLD I  Pt is not on home inhales Order albuterol  as needed and scheduled DuoNeb   Essential hypertension Allow permissive HNT given hypotension  Sepsis (HCC)  -SIRS criteria met with      Component Value Date/Time   WBC 10.5 07/03/2023 1422   LYMPHSABS 0.8 07/03/2023 1422    fever   RR >20 Today's Vitals   07/03/23 1800 07/03/23 1815 07/03/23 1830 07/03/23 1847  BP: 109/65 110/68 104/61   Pulse: 89 89 82   Resp:      Temp:    98.4 F (36.9 C)  TempSrc:    Oral  SpO2: 96% 96% 97%     -Most likely source being:  pulmonary     Patient meeting criteria for Severe sepsis with    evidence of end organ damage/organ dysfunction such as    elevated lactic acid >2     Component Value Date/Time   LATICACIDVEN 2.0 (HH) 07/03/2023 1615    acute metabolic encephalopathy     Antibiotics Given (last 72 hours)     Date/Time Action Medication Dose Rate   07/03/23 1442 New Bag/Given   metroNIDAZOLE (FLAGYL) IVPB 500 mg 500 mg 100 mL/hr   07/03/23 1444 New Bag/Given   ceFEPIme (MAXIPIME) 2 g in sodium chloride  0.9 % 100 mL IVPB 2 g 200 mL/hr   07/03/23 1541 New Bag/Given   vancomycin (VANCOREADY) IVPB 1500 mg/300 mL 1,500 mg 150 mL/hr       Will continue  on : Unasyn and azithromycin   - await results of blood and urine culture  - Rehydrate aggressively  Intravenous fluids were administered,       7:34 PM   Debility Frequent falls would benefit from PT OT eval  AAA (abdominal aortic aneurysm) (HCC) Chronic status post repair  Acute metabolic encephalopathy   - most likely multifactorial secondary to combination of  infection   dehydration secondary to decreased by mouth intake,   - Will rehydrate    - treat underlining infection      -  given possible ataxia will need further imaging to evaluate for CNS pathology pathology such as MRI of the brain hold off on contrast given CKD  - neurological exam appears to be nonfocal but patient unable to cooperate fully   - VBG unremarkable no evidence of hypercarbia    - ammonia ordered   CKD (chronic kidney disease) stage 3, GFR 30-59 ml/min (HCC)  -chronic avoid nephrotoxic medications such as NSAIDs, Vanco Zosyn combo,  avoid hypotension, continue to follow renal function Evidence of acute on chronic renal failure.  Will rehydrate obtain electrolytes    Other plan as per orders.  DVT prophylaxis:  SCD      Code Status:    Code Status: Prior FULL CODE  as per patient   I had personally discussed CODE STATUS with patient and family  ACP   none   Family  Communication:   Family   at  Bedside  plan of care was discussed   with Wife   Diet  Diet Orders (From admission, onward)     Start     Ordered  07/03/23 1339  Diet NPO time specified  (Septic presentation on arrival (screening labs, nursing and treatment orders for obvious sepsis))  Diet effective now        07/03/23 1339            Disposition Plan:      To home once workup is complete and patient is stable   Following barriers for discharge:                                                        Electrolytes corrected                               Anemia  stable                                                           Afebrile, white count improving able to transition to PO antibiotics                             Will need to be able to tolerate PO                                Consult Orders  (From admission, onward)           Start     Ordered   07/03/23 1801  Consult to hospitalist  Paged by Fredrik Jensen  Once       Provider:  (Not yet assigned)  Question Answer Comment  Place call to: [facility]   Reason for Consult Admit      07/03/23 1800                               Would benefit from PT/OT eval prior to DC  Ordered                                      Consults called: NONE     Admission status:  ED Disposition     ED Disposition  Admit   Condition  --   Comment  [facility]: [facility] [[id number]]  Level of Care: Progressive [102]  Admit to Progressive based on following criteria: MULTISYSTEM THREATS such as stable sepsis, metabolic/electrolyte imbalance with or without encephalopathy that is responding to early treatment.  May place patient in observation at [facility] or [facility] if equivalent level of care is available:: No  Covid Evaluation: Asymptomatic - no recent exposure (last 10 days) testing not required  Diagnosis: Sepsis [facility] [[id number]]  Admitting Physician: Meckenzie Balsley [[id number]]  Attending  Physician: Valeda Corzine [[id number]]           Obs      Level of care      progressive     tele indefinitely please discontinue once patient no longer qualifies COVID-19 Labs  Atia Haupt 07/03/2023, 8:21 PM    [facility]     after 2 AM please page floor coverage   If 7AM-7PM, please contact the day team taking care of the patient using [email or web address]

## 2023-07-03 NOTE — ED Provider Notes (Signed)
  Physical Exam  BP 126/78   Pulse 99   Temp (!) 97.3 F (36.3 C) (Oral)   Resp 15   Ht 5\' 8"  (1.727 m)   Wt 70.4 kg   SpO2 92%   BMI 23.60 kg/m   Physical Exam Vitals and nursing note reviewed.  HENT:     Head: Normocephalic and atraumatic.  Eyes:     Pupils: Pupils are equal, round, and reactive to light.  Cardiovascular:     Rate and Rhythm: Normal rate and regular rhythm.  Pulmonary:     Effort: Pulmonary effort is normal.     Breath sounds: Normal breath sounds.  Abdominal:     Palpations: Abdomen is soft.     Tenderness: There is no abdominal tenderness.  Skin:    General: Skin is warm and dry.  Neurological:     Mental Status: He is alert.  Psychiatric:        Mood and Affect: Mood normal.     Procedures  Procedures  ED Course / MDM   Clinical Course as of 07/03/23 2136  Sun Jul 03, 2023  1914 CT imaging consistent with pneumonia.  Covered with Flagyl cefepime hemodynamically stable after IV fluid bolus.  Discussed admitting hospitalist accept patient for admission [MP]    Clinical Course User Index [MP] Sallyanne Creamer, DO   Medical Decision Making I, Rafael Bun DO, have assumed care of this patient from the previous provider pending CT and admission  Amount and/or Complexity of Data Reviewed Labs: ordered. Radiology: ordered.  Risk OTC drugs. Prescription drug management. Decision regarding hospitalization.          Sallyanne Creamer, DO 07/03/23 2136

## 2023-07-03 NOTE — Assessment & Plan Note (Signed)
 Frequent falls would benefit from PT OT eval

## 2023-07-03 NOTE — Assessment & Plan Note (Signed)
Continue on Neurontin

## 2023-07-03 NOTE — ED Notes (Signed)
 Report received, assumed care of patient t at this time.

## 2023-07-03 NOTE — Sepsis Progress Note (Signed)
 Sepsis protocol is being followed by [facility].

## 2023-07-03 NOTE — ED Provider Notes (Signed)
 [place] EMERGENCY DEPARTMENT AT [facility] Provider Note   CSN: [id number] Arrival date & time: 07/03/23  1327     History  Chief Complaint  Patient presents with   Altered Mental Status    Danny [facility] is a 88 y.o. male.  HPI Patient presents for altered mental status.  Medical history includes AAA, CKD, HLD, HTN, COPD, lung cancer.  He was seen in the ED 3 days ago after a fall.  Fall was attributed to generalized weakness.  He did endorse fevers at home at that time.  Wife reported some recent urinary frequency during that prior visit.  His lab work at the time was notable for elevation in creatinine from baseline.  His imaging studies did not show any acute injuries.  He received IV fluids and was discharged home.  Today, he had another fall.  During this fall, he did not strike his head and does not have any areas of new pain.  His wife reported that he has had ongoing disorientation for the past 4 days.  EMS noted tactile fever, tachycardia, tachypnea.  Patient currently denies any shortness of breath or areas of discomfort.    Home Medications Prior to Admission medications   Medication Sig Start Date End Date Taking? Authorizing Provider  Oxycodone  HCl 20 MG TABS Take 1 tablet by mouth every 6 (six) hours as needed. 06/27/23  Yes [provider]  acetaminophen  (TYLENOL ) 500 MG tablet Take 1,000 mg by mouth every 6 (six) hours as needed for moderate pain or headache.    [provider]  allopurinol  (ZYLOPRIM ) 100 MG tablet Take 100 mg by mouth daily. 10/23/21   [provider]  amLODipine  (NORVASC ) 10 MG tablet Take 10 mg by mouth daily.    [provider]  aspirin  EC 81 MG tablet Take 81 mg by mouth daily.    [provider]  benzonatate  (TESSALON ) 100 MG capsule Take 1 capsule (100 mg total) by mouth every 8 (eight) hours as needed for cough. Patient not taking: Reported on 03/13/2023 09/24/22   Spence Dux, PA-C   doxycycline  (VIBRAMYCIN ) 100 MG capsule Take 1 capsule (100 mg total) by mouth 2 (two) times daily. 03/13/23   Spence Dux, PA-C  DULoxetine  (CYMBALTA ) 60 MG capsule TAKE 1 CAPSULE(60 MG) BY MOUTH DAILY Patient taking differently: Take 60 mg by mouth in the morning. 02/14/23   Vaslow, Zachary K, MD  FARXIGA  10 MG TABS tablet Take 10 mg by mouth daily. 01/19/22   [provider]  gabapentin  (NEURONTIN ) 300 MG capsule TAKE 3 CAPSULES(900 MG) BY MOUTH THREE TIMES DAILY Patient taking differently: Take 900 mg by mouth 3 (three) times daily. 10/06/20   Vaslow, Zachary K, MD  lidocaine  (LIDODERM ) 5 % Place 1 patch onto the skin daily. Remove & Discard patch within 12 hours or as directed by MD Patient not taking: Reported on 03/13/2023 10/04/22   Lowery Rue, DO  metoprolol  succinate (TOPROL -XL) 25 MG 24 hr tablet Take 1 tablet (25 mg total) by mouth daily. 09/26/17   Gold, Wayne E, PA-C  omeprazole (PRILOSEC) 40 MG capsule Take 40 mg by mouth daily before breakfast.    [provider]  oxyCODONE  (ROXICODONE ) 5 MG immediate release tablet Take 1 tablet (5 mg total) by mouth every 8 (eight) hours as needed for up to 12 doses for breakthrough pain. Patient not taking: Reported on 07/03/2023 10/04/22   Lowery Rue, DO  simvastatin  (ZOCOR ) 20 MG tablet Take 20  mg by mouth daily.    [provider]      Allergies    Patient has no known allergies.    Review of Systems   Review of Systems  Constitutional:  Positive for fever.  Psychiatric/Behavioral:  Positive for confusion.   All other systems reviewed and are negative.   Physical Exam Updated Vital Signs BP 122/72 (BP Location: Right Arm)   Pulse (!) 109   Temp (!) 101.7 F (38.7 C) (Rectal)   Resp 18   SpO2 96%  Physical Exam Vitals and nursing note reviewed.  Constitutional:      General: He is not in acute distress.    Appearance: Normal appearance. He is well-developed. He is not ill-appearing, toxic-appearing  or diaphoretic.  HENT:     Head: Normocephalic and atraumatic.     Right Ear: External ear normal.     Left Ear: External ear normal.     Nose: Nose normal.     Mouth/Throat:     Mouth: Mucous membranes are moist.  Eyes:     Extraocular Movements: Extraocular movements intact.     Conjunctiva/sclera: Conjunctivae normal.  Cardiovascular:     Rate and Rhythm: Normal rate and regular rhythm.     Heart sounds: No murmur heard. Pulmonary:     Effort: Pulmonary effort is normal. Tachypnea present. No respiratory distress.     Breath sounds: Examination of the right-lower field reveals rales. Rales present. No decreased breath sounds or wheezing.  Abdominal:     General: There is no distension.     Palpations: Abdomen is soft.     Tenderness: There is no abdominal tenderness. There is no right CVA tenderness or left CVA tenderness.  Musculoskeletal:        General: No swelling or deformity. Normal range of motion.     Cervical back: Normal range of motion and neck supple.     Right lower leg: No edema.  Skin:    General: Skin is warm and dry.     Coloration: Skin is not jaundiced or pale.  Neurological:     General: No focal deficit present.     Mental Status: He is alert. He is disoriented.  Psychiatric:        Mood and Affect: Mood normal.        Behavior: Behavior normal.     ED Results / Procedures / Treatments   Labs (all labs ordered are listed, but only abnormal results are displayed) Labs Reviewed  CBC WITH DIFFERENTIAL/PLATELET - Abnormal; Notable for the following components:      Result Value   RBC 4.08 (*)    Hemoglobin 12.7 (*)    HCT 37.8 (*)    Platelets 135 (*)    Neutro Abs 8.1 (*)    Monocytes Absolute 1.3 (*)    All other components within normal limits  PROTIME-INR - Abnormal; Notable for the following components:   Prothrombin Time 15.8 (*)    All other components within normal limits  RESP PANEL BY RT-PCR (RSV, FLU A&B, COVID)  RVPGX2  CULTURE,  BLOOD (ROUTINE X 2)  CULTURE, BLOOD (ROUTINE X 2)  COMPREHENSIVE METABOLIC PANEL WITH GFR  URINALYSIS, W/ REFLEX TO CULTURE (INFECTION SUSPECTED)  MAGNESIUM   I-STAT CG4 LACTIC ACID, ED  I-STAT VENOUS BLOOD GAS, ED    EKG EKG Interpretation Date/Time:  Sunday July 03 2023 13:40:49 EDT Ventricular Rate:  108 PR Interval:  130 QRS Duration:  82 QT Interval:  306  QTC Calculation: 411 R Axis:   42  Text Interpretation: Sinus tachycardia Abnormal R-wave progression, early transition Borderline T wave abnormalities Confirmed by Iva Mariner [phone]) on 07/03/2023 2:24:17 PM  Radiology DG Chest Port 1 View Result Date: 07/03/2023 CLINICAL DATA:  Possible sepsis. EXAM: PORTABLE CHEST 1 VIEW COMPARISON:  06/30/2023 chest radiograph.  Chest CT of 03/13/2023 FINDINGS: Single AP view of the chest with patient moderately rotated to the right. Numerous leads and wires project over the chest. Tracheal deviation right secondary to obliquity and volume loss in the right hemithorax. Normal heart size. Trace right pleural fluid or thickening. No pneumothorax. Emphysema, with primarily right-sided pulmonary interstitial thickening. Extensive partially treatment related volume loss and architectural distortion throughout the right hemithorax. When compared to 03/13/2023, superimposed right mid and lower lung airspace disease, increased since 06/30/2023. Biapical pleuroparenchymal scarring, greater right than left. IMPRESSION: Progressive right mid and lower lung airspace disease/pneumonia, superimposed upon chronic architectural distortion and interstitial thickening. Emphysema (ICD10-J43.9). Electronically Signed   By: Lore Rode M.D.   On: 07/03/2023 14:52    Procedures Procedures    Medications Ordered in ED Medications  lactated ringers  infusion (has no administration in time range)  lactated ringers  bolus 1,000 mL (1,000 mLs Intravenous New Bag/Given 07/03/23 1440)    And  lactated ringers  bolus 1,000  mL (1,000 mLs Intravenous New Bag/Given 07/03/23 1441)    And  lactated ringers  bolus 250 mL (has no administration in time range)  ceFEPIme (MAXIPIME) 2 g in sodium chloride  0.9 % 100 mL IVPB (2 g Intravenous New Bag/Given 07/03/23 1444)  metroNIDAZOLE (FLAGYL) IVPB 500 mg (500 mg Intravenous New Bag/Given 07/03/23 1442)  vancomycin (VANCOREADY) IVPB 1500 mg/300 mL (has no administration in time range)  acetaminophen  (TYLENOL ) tablet 650 mg (650 mg Oral Given 07/03/23 1459)    ED Course/ Medical Decision Making/ A&P                                 Medical Decision Making Amount and/or Complexity of Data Reviewed Labs: ordered. Radiology: ordered.  Risk OTC drugs. Prescription drug management.   This patient presents to the ED for concern of altered mental status, this involves an extensive number of treatment options, and is a complaint that carries with it a high risk of complications and morbidity.  The differential diagnosis includes sepsis, polypharmacy, intoxication, head injury, neurocognitive disorder, metabolic derangements   Co morbidities that complicate the patient evaluation  AAA, CKD, HLD, HTN, COPD, lung cancer   Additional history obtained:  Additional history obtained from EMS External records from outside source obtained and reviewed including EMR   Lab Tests:  I Ordered, and personally interpreted labs.  The pertinent results include: Normal hemoglobin, no leukocytosis; remaining lab work pending at time of signout   Imaging Studies ordered:  I ordered imaging studies including chest x-ray, CT of head, chest, abdomen, pelvis I independently visualized and interpreted imaging which showed chest x-ray shows concern of right middle and lower lung pneumonia; CT imaging pending at time of signout. I agree with the radiologist interpretation   Cardiac Monitoring: / EKG:  The patient was maintained on a cardiac monitor.  I personally viewed and interpreted  the cardiac monitored which showed an underlying rhythm of: sinus rhythm   Problem List / ED Course / Critical interventions / Medication management  Patient presenting for altered mental status, generalized weakness over the past 5 days.  This is reported to EMS by his wife.  It seems that his weakness has contributed to several falls, including today.  He denies any new areas of discomfort.  He is a poor historian.  He is slightly disoriented, as he does think it is 2024.  EMS noted tachycardia, tachypnea, and tactile fever.  Patient meet sepsis criteria.  Given that his fevers have been ongoing for 5 days, unlikely to be viral in etiology.  Sepsis treatment and workup were initiated.  Chest x-ray shows evidence of right mid and lower lung pneumonia.  This is consistent with crackles appreciated on lung auscultation.  Remaining lab work imaging were pending at time of signout.  Care of patient was signed out to oncoming ED provider. I ordered medication including IV fluids and broad-spectrum antibiotics for empiric treatment of sepsis; Tylenol  for antipyresis Reevaluation of the patient after these medicines showed that the patient improved I have reviewed the patients home medicines and have made adjustments as needed   Social Determinants of Health:  Lives at home with wife  CRITICAL CARE Performed by: Iva Mariner   Total critical care time: 32 minutes  Critical care time was exclusive of separately billable procedures and treating other patients.  Critical care was necessary to treat or prevent imminent or life-threatening deterioration.  Critical care was time spent personally by me on the following activities: development of treatment plan with patient and/or surrogate as well as nursing, discussions with consultants, evaluation of patient's response to treatment, examination of patient, obtaining history from patient or surrogate, ordering and performing treatments and interventions,  ordering and review of laboratory studies, ordering and review of radiographic studies, pulse oximetry and re-evaluation of patient's condition.        Final Clinical Impression(s) / ED Diagnoses Final diagnoses:  Pneumonia of right lower lobe due to infectious organism  Sepsis with encephalopathy without septic shock, due to unspecified organism [facility])    Rx / DC Orders ED Discharge Orders     None         Iva Mariner, MD 07/03/23 1504

## 2023-07-03 NOTE — ED Notes (Signed)
 Was only able to get 1 set of blood culture d/t the pt is a hard stick. MD made aware.

## 2023-07-03 NOTE — Assessment & Plan Note (Signed)
 Chronic status post repair

## 2023-07-03 NOTE — Assessment & Plan Note (Signed)
-   most likely multifactorial secondary to combination of  infection   dehydration secondary to decreased by mouth intake,   - Will rehydrate    - treat underlining infection      -  given possible ataxia will need further imaging to evaluate for CNS pathology pathology such as MRI of the brain hold off on contrast given CKD  - neurological exam appears to be nonfocal but patient unable to cooperate fully   - VBG unremarkable no evidence of hypercarbia    - ammonia ordered

## 2023-07-03 NOTE — Subjective & Objective (Signed)
 Comes in from home for fall wife reported patient has been confused ever since Wednesday he has been having some fevers at home.  Blood pressure on arrival 116/74 heart rate 110 Prior to this patient was seen in the emergency department about 3 days ago for a fall back then thought to be secondary to generalized fatigue. Endorsing urinary frequency Patient did not strike head Patient denied any chest pain shortness of breath Any was able to obtain 1 set of blood cultures patient is a hard stick Chest x-ray concerning for pneumonia on the right in the lower lung space Patient initially started on broad-spectrum antibiotics including cefepime Vanco and Flagyl  CT chest abdomen concerning for pneumonia possible aspiration evidence of COPD

## 2023-07-04 DIAGNOSIS — E860 Dehydration: Secondary | ICD-10-CM | POA: Diagnosis present

## 2023-07-04 DIAGNOSIS — J440 Chronic obstructive pulmonary disease with acute lower respiratory infection: Secondary | ICD-10-CM | POA: Diagnosis present

## 2023-07-04 DIAGNOSIS — J189 Pneumonia, unspecified organism: Secondary | ICD-10-CM

## 2023-07-04 DIAGNOSIS — E7800 Pure hypercholesterolemia, unspecified: Secondary | ICD-10-CM | POA: Diagnosis present

## 2023-07-04 DIAGNOSIS — J690 Pneumonitis due to inhalation of food and vomit: Secondary | ICD-10-CM | POA: Diagnosis present

## 2023-07-04 DIAGNOSIS — G620 Drug-induced polyneuropathy: Secondary | ICD-10-CM | POA: Diagnosis present

## 2023-07-04 DIAGNOSIS — Z85118 Personal history of other malignant neoplasm of bronchus and lung: Secondary | ICD-10-CM | POA: Diagnosis not present

## 2023-07-04 DIAGNOSIS — I714 Abdominal aortic aneurysm, without rupture, unspecified: Secondary | ICD-10-CM | POA: Diagnosis present

## 2023-07-04 DIAGNOSIS — A419 Sepsis, unspecified organism: Secondary | ICD-10-CM | POA: Diagnosis present

## 2023-07-04 DIAGNOSIS — Z1152 Encounter for screening for COVID-19: Secondary | ICD-10-CM | POA: Diagnosis not present

## 2023-07-04 DIAGNOSIS — N179 Acute kidney failure, unspecified: Secondary | ICD-10-CM | POA: Diagnosis present

## 2023-07-04 DIAGNOSIS — D631 Anemia in chronic kidney disease: Secondary | ICD-10-CM | POA: Diagnosis present

## 2023-07-04 DIAGNOSIS — E440 Moderate protein-calorie malnutrition: Secondary | ICD-10-CM | POA: Diagnosis present

## 2023-07-04 DIAGNOSIS — G8929 Other chronic pain: Secondary | ICD-10-CM | POA: Diagnosis present

## 2023-07-04 DIAGNOSIS — N1831 Chronic kidney disease, stage 3a: Secondary | ICD-10-CM | POA: Diagnosis present

## 2023-07-04 DIAGNOSIS — Z6823 Body mass index (BMI) 23.0-23.9, adult: Secondary | ICD-10-CM | POA: Diagnosis not present

## 2023-07-04 DIAGNOSIS — M7989 Other specified soft tissue disorders: Secondary | ICD-10-CM | POA: Diagnosis not present

## 2023-07-04 DIAGNOSIS — G9341 Metabolic encephalopathy: Secondary | ICD-10-CM | POA: Diagnosis present

## 2023-07-04 DIAGNOSIS — E876 Hypokalemia: Secondary | ICD-10-CM | POA: Diagnosis not present

## 2023-07-04 DIAGNOSIS — M549 Dorsalgia, unspecified: Secondary | ICD-10-CM | POA: Diagnosis present

## 2023-07-04 DIAGNOSIS — Z7982 Long term (current) use of aspirin: Secondary | ICD-10-CM | POA: Diagnosis not present

## 2023-07-04 DIAGNOSIS — T451X5A Adverse effect of antineoplastic and immunosuppressive drugs, initial encounter: Secondary | ICD-10-CM | POA: Diagnosis present

## 2023-07-04 DIAGNOSIS — I129 Hypertensive chronic kidney disease with stage 1 through stage 4 chronic kidney disease, or unspecified chronic kidney disease: Secondary | ICD-10-CM | POA: Diagnosis present

## 2023-07-04 DIAGNOSIS — R6520 Severe sepsis without septic shock: Secondary | ICD-10-CM | POA: Diagnosis present

## 2023-07-04 DIAGNOSIS — E872 Acidosis, unspecified: Secondary | ICD-10-CM | POA: Diagnosis present

## 2023-07-04 DIAGNOSIS — R4182 Altered mental status, unspecified: Secondary | ICD-10-CM | POA: Diagnosis present

## 2023-07-04 DIAGNOSIS — J449 Chronic obstructive pulmonary disease, unspecified: Secondary | ICD-10-CM | POA: Diagnosis not present

## 2023-07-04 LAB — COMPREHENSIVE METABOLIC PANEL WITH GFR
ALT: 14 U/L (ref 0–44)
AST: 31 U/L (ref 15–41)
Albumin: 2.3 g/dL — ABNORMAL LOW (ref 3.5–5.0)
Alkaline Phosphatase: 52 U/L (ref 38–126)
Anion gap: 11 (ref 5–15)
BUN: 8 mg/dL (ref 8–23)
CO2: 22 mmol/L (ref 22–32)
Calcium: 8.8 mg/dL — ABNORMAL LOW (ref 8.9–10.3)
Chloride: 103 mmol/L (ref 98–111)
Creatinine, Ser: 1.18 mg/dL (ref 0.61–1.24)
GFR, Estimated: >60 mL/min (ref >60)
Glucose, Bld: 117 mg/dL — ABNORMAL HIGH (ref 70–99)
Potassium: 3.2 mmol/L — ABNORMAL LOW (ref 3.5–5.1)
Sodium: 136 mmol/L (ref 135–145)
Total Bilirubin: 1.4 mg/dL — ABNORMAL HIGH (ref 0.0–1.2)
Total Protein: 6.4 g/dL — ABNORMAL LOW (ref 6.5–8.1)

## 2023-07-04 LAB — CBC
HCT: 34 % — ABNORMAL LOW (ref 39.0–52.0)
Hemoglobin: 11.4 g/dL — ABNORMAL LOW (ref 13.0–17.0)
MCH: 30.6 pg (ref 26.0–34.0)
MCHC: 33.5 g/dL (ref 30.0–36.0)
MCV: 91.4 fL (ref 80.0–100.0)
Platelets: 127 10*3/uL — ABNORMAL LOW (ref 150–400)
RBC: 3.72 MIL/uL — ABNORMAL LOW (ref 4.22–5.81)
RDW: 13.8 % (ref 11.5–15.5)
WBC: 9.9 10*3/uL (ref 4.0–10.5)
nRBC: 0 % (ref 0.0–0.2)

## 2023-07-04 LAB — MAGNESIUM: Magnesium: 1.8 mg/dL (ref 1.7–2.4)

## 2023-07-04 LAB — LACTIC ACID, PLASMA: Lactic Acid, Venous: 1.1 mmol/L (ref 0.5–1.9)

## 2023-07-04 LAB — PREALBUMIN: Prealbumin: <5 mg/dL — ABNORMAL LOW (ref 18–38)

## 2023-07-04 LAB — PHOSPHORUS: Phosphorus: 2.5 mg/dL (ref 2.5–4.6)

## 2023-07-04 MED ORDER — ACETAMINOPHEN 325 MG PO TABS
650.0000 mg | ORAL_TABLET | ORAL | Status: DC | PRN
  Administered 2023-07-04: 650 mg via ORAL
  Filled 2023-07-04: qty 2

## 2023-07-04 MED ORDER — ONDANSETRON HCL 4 MG/2ML IJ SOLN: 4.0000 mg | Freq: Four times a day (QID) | INTRAMUSCULAR | Status: DC | PRN

## 2023-07-04 MED ORDER — SODIUM CHLORIDE 0.9 % IV SOLN
INTRAVENOUS | Status: DC
Start: 2023-07-04 — End: 2023-07-04

## 2023-07-04 MED ORDER — ACETAMINOPHEN 325 MG PO TABS
650.0000 mg | ORAL_TABLET | Freq: Four times a day (QID) | ORAL | Status: DC | PRN
  Administered 2023-07-04 – 2023-07-06 (×5): 650 mg via ORAL
  Filled 2023-07-04 (×6): qty 2

## 2023-07-04 MED ORDER — ENOXAPARIN SODIUM 40 MG/0.4ML IJ SOSY
40.0000 mg | PREFILLED_SYRINGE | INTRAMUSCULAR | Status: DC
  Administered 2023-07-04 – 2023-07-07 (×4): 40 mg via SUBCUTANEOUS
  Filled 2023-07-04 (×4): qty 0.4

## 2023-07-04 MED ORDER — ALLOPURINOL 100 MG PO TABS
100.0000 mg | ORAL_TABLET | Freq: Every day | ORAL | Status: DC
  Administered 2023-07-04 – 2023-07-08 (×5): 100 mg via ORAL
  Filled 2023-07-04 (×5): qty 1

## 2023-07-04 MED ORDER — PANTOPRAZOLE SODIUM 40 MG PO TBEC
40.0000 mg | DELAYED_RELEASE_TABLET | Freq: Every day | ORAL | Status: DC
  Administered 2023-07-04 – 2023-07-08 (×5): 40 mg via ORAL
  Filled 2023-07-04 (×5): qty 1

## 2023-07-04 MED ORDER — FENTANYL CITRATE PF 50 MCG/ML IJ SOSY
12.5000 ug | PREFILLED_SYRINGE | INTRAMUSCULAR | Status: DC | PRN
Start: 2023-07-04 — End: 2023-07-08

## 2023-07-04 MED ORDER — DULOXETINE HCL 60 MG PO CPEP
60.0000 mg | ORAL_CAPSULE | Freq: Every day | ORAL | Status: DC
  Administered 2023-07-04 – 2023-07-08 (×5): 60 mg via ORAL
  Filled 2023-07-04 (×5): qty 1

## 2023-07-04 MED ORDER — POTASSIUM CHLORIDE CRYS ER 20 MEQ PO TBCR
40.0000 meq | EXTENDED_RELEASE_TABLET | Freq: Once | ORAL | Status: AC
Start: 2023-07-04 — End: 2023-07-04
  Administered 2023-07-04: 40 meq via ORAL
  Filled 2023-07-04: qty 2

## 2023-07-04 MED ORDER — GABAPENTIN 300 MG PO CAPS
900.0000 mg | ORAL_CAPSULE | Freq: Three times a day (TID) | ORAL | Status: DC
  Administered 2023-07-04 – 2023-07-06 (×7): 900 mg via ORAL
  Filled 2023-07-04 (×7): qty 3

## 2023-07-04 MED ORDER — ALBUTEROL SULFATE (2.5 MG/3ML) 0.083% IN NEBU: 2.5000 mg | INHALATION_SOLUTION | RESPIRATORY_TRACT | Status: DC | PRN

## 2023-07-04 MED ORDER — ONDANSETRON HCL 4 MG PO TABS
4.0000 mg | ORAL_TABLET | Freq: Four times a day (QID) | ORAL | Status: DC | PRN
Start: 2023-07-04 — End: 2023-07-08

## 2023-07-04 MED ORDER — ENSURE ENLIVE PO LIQD
237.0000 mL | Freq: Two times a day (BID) | ORAL | Status: DC
  Administered 2023-07-05 – 2023-07-08 (×4): 237 mL via ORAL

## 2023-07-04 MED ORDER — BENZONATATE 100 MG PO CAPS: 200.0000 mg | ORAL_CAPSULE | Freq: Three times a day (TID) | ORAL | Status: DC | PRN

## 2023-07-04 MED ORDER — IPRATROPIUM-ALBUTEROL 0.5-2.5 (3) MG/3ML IN SOLN
3.0000 mL | Freq: Four times a day (QID) | RESPIRATORY_TRACT | Status: DC
  Filled 2023-07-04: qty 3

## 2023-07-04 MED ORDER — ASPIRIN 81 MG PO TBEC
81.0000 mg | DELAYED_RELEASE_TABLET | Freq: Every day | ORAL | Status: DC
  Administered 2023-07-04 – 2023-07-08 (×5): 81 mg via ORAL
  Filled 2023-07-04 (×5): qty 1

## 2023-07-04 MED ORDER — OXYCODONE HCL 5 MG PO TABS
5.0000 mg | ORAL_TABLET | Freq: Four times a day (QID) | ORAL | Status: DC | PRN
  Administered 2023-07-07 (×2): 5 mg via ORAL
  Filled 2023-07-04 (×2): qty 1

## 2023-07-04 MED ORDER — SIMVASTATIN 20 MG PO TABS
20.0000 mg | ORAL_TABLET | Freq: Every day | ORAL | Status: DC
  Administered 2023-07-04 – 2023-07-08 (×5): 20 mg via ORAL
  Filled 2023-07-04 (×5): qty 1

## 2023-07-04 MED ORDER — AZITHROMYCIN 500 MG PO TABS
500.0000 mg | ORAL_TABLET | Freq: Every day | ORAL | Status: AC
Start: 2023-07-04 — End: 2023-07-07
  Administered 2023-07-04 – 2023-07-06 (×3): 500 mg via ORAL
  Filled 2023-07-04 (×3): qty 1

## 2023-07-04 MED ORDER — GUAIFENESIN ER 600 MG PO TB12
600.0000 mg | ORAL_TABLET | Freq: Two times a day (BID) | ORAL | Status: DC
  Administered 2023-07-04 – 2023-07-08 (×9): 600 mg via ORAL
  Filled 2023-07-04 (×9): qty 1

## 2023-07-04 MED ORDER — ACETAMINOPHEN 650 MG RE SUPP: 650.0000 mg | Freq: Four times a day (QID) | RECTAL | Status: DC | PRN

## 2023-07-04 MED ORDER — ADULT MULTIVITAMIN W/MINERALS CH
1.0000 | ORAL_TABLET | Freq: Every day | ORAL | Status: DC
  Administered 2023-07-04 – 2023-07-08 (×5): 1 via ORAL
  Filled 2023-07-04 (×5): qty 1

## 2023-07-04 NOTE — Progress Notes (Signed)
Inpatient Rehab Admissions Coordinator:   Per therapy recommendation, patient was screened for CIR candidacy by Zakk Borgen, MS, CCC-SLP. At this time, Pt. does not appear to demonstrate medical necessity to justify in hospital rehabilitation/CIR. I will not pursue a rehab consult for this Pt.   Recommend other rehab venues to be pursued.  Please contact me with any questions.  Tiandra Swoveland, MS, CCC-SLP Rehab Admissions Coordinator  [phone] (celll) [phone] (office)   

## 2023-07-04 NOTE — Evaluation (Signed)
 Physical Therapy Evaluation Patient Details Name: Danny Duke MRN: [id number] DOB: 1935/05/29 Today's Date: 07/04/2023  History of Present Illness  Pt is an 88 y.o. male admitted 4/27 with sepsis and PNA. He has been falling at home and had confusion. PMH:  COPD, AAA, CKD, HLD, HTN, recurrent non-small cell lung cancer  Clinical Impression  Pt admitted with above diagnosis. PTA pt lived at home with his wife, independent and driving. Pt currently with functional limitations due to the deficits listed below (see PT Problem List). On eval, pt required min assist bed mobility, min assist transfers, and min assist amb 20' with RW. He demo fair sitting balance and poor standing balance. He fatigued quickly. Pt will benefit from acute skilled PT to increase their independence and safety with mobility to allow discharge.  Post acute pt would benefit from further therapy in inpatient setting, > 3 hours/day.         If plan is discharge home, recommend the following: A little help with walking and/or transfers;A lot of help with bathing/dressing/bathroom;Assistance with cooking/housework;Assist for transportation;Help with stairs or ramp for entrance   Can travel by private vehicle        Equipment Recommendations Rolling walker (2 wheels);BSC/3in1  Recommendations for Other Services  Rehab consult    Functional Status Assessment Patient has had a recent decline in their functional status and demonstrates the ability to make significant improvements in function in a reasonable and predictable amount of time.     Precautions / Restrictions Precautions Precautions: Fall Recall of Precautions/Restrictions: Impaired      Mobility  Bed Mobility Overal bed mobility: Needs Assistance Bed Mobility: Supine to Sit     Supine to sit: Min assist, Used rails, HOB elevated          Transfers Overall transfer level: Needs assistance Equipment used: None, Rolling walker (2 wheels) Transfers:  Sit to/from Stand Sit to Stand: Min assist           General transfer comment: STS from bed without AD. Pt unsteady. RW utilized for further transfers.    Ambulation/Gait Ambulation/Gait assistance: Min assist Gait Distance (Feet): 20 Feet (+ 10) Assistive device: Rolling walker (2 wheels), IV Pole Gait Pattern/deviations: Step-through pattern, Decreased stride length Gait velocity: decreased Gait velocity interpretation: <1.31 ft/sec, indicative of household ambulator   General Gait Details: Intial gait trial pushing IV pole. Pt very unsteady. RW utilized for repeat gait trial.  Stairs            Wheelchair Mobility     Tilt Bed    Modified Rankin (Stroke Patients Only)       Balance Overall balance assessment: Needs assistance Sitting-balance support: Bilateral upper extremity supported, Feet supported Sitting balance-Leahy Scale: Fair     Standing balance support: Bilateral upper extremity supported, Reliant on assistive device for balance, During functional activity Standing balance-Leahy Scale: Poor                               Pertinent Vitals/Pain Pain Assessment Pain Assessment: Faces Faces Pain Scale: Hurts little more Pain Location: back Pain Descriptors / Indicators: Discomfort Pain Intervention(s): Monitored during session, Repositioned    Home Living Family/patient expects to be discharged to:: Private residence Living Arrangements: Spouse/significant other Available Help at Discharge: Family;Available 24 hours/day Type of Home: House Home Access: Stairs to enter Entrance Stairs-Rails: Doctor, general practice of Steps: 5   Home Layout: One level Home  Equipment: None      Prior Function Prior Level of Function : Independent/Modified Independent;Driving;History of Falls (last six months)             Mobility Comments: no AD at baseline       Extremity/Trunk Assessment   Upper Extremity  Assessment Upper Extremity Assessment: Generalized weakness    Lower Extremity Assessment Lower Extremity Assessment: Generalized weakness    Cervical / Trunk Assessment Cervical / Trunk Assessment: Kyphotic  Communication   Communication Communication: No apparent difficulties    Cognition Arousal: Alert Behavior During Therapy: WFL for tasks assessed/performed   PT - Cognitive impairments: Safety/Judgement                         Following commands: Intact       Cueing       General Comments General comments (skin integrity, edema, etc.): VSS on RA    Exercises     Assessment/Plan    PT Assessment Patient needs continued PT services  PT Problem List Decreased strength;Decreased balance;Decreased mobility;Decreased knowledge of use of DME;Decreased activity tolerance;Decreased safety awareness       PT Treatment Interventions DME instruction;Functional mobility training;Balance training;Patient/family education;Gait training;Therapeutic activities;Therapeutic exercise;Stair training    PT Goals (Current goals can be found in the Care Plan section)  Acute Rehab PT Goals Patient Stated Goal: get stronger PT Goal Formulation: With patient/family Time For Goal Achievement: 07/18/23 Potential to Achieve Goals: Good    Frequency Min 2X/week     Co-evaluation               AM-PAC PT "6 Clicks" Mobility  Outcome Measure Help needed turning from your back to your side while in a flat bed without using bedrails?: A Little Help needed moving from lying on your back to sitting on the side of a flat bed without using bedrails?: A Little Help needed moving to and from a bed to a chair (including a wheelchair)?: A Little Help needed standing up from a chair using your arms (e.g., wheelchair or bedside chair)?: A Little Help needed to walk in hospital room?: A Little Help needed climbing 3-5 steps with a railing? : A Lot 6 Click Score: 17    End of  Session Equipment Utilized During Treatment: Gait belt Activity Tolerance: Patient tolerated treatment well Patient left: in chair;with call bell/phone within reach;with family/visitor present Nurse Communication: Mobility status PT Visit Diagnosis: Muscle weakness (generalized) (M62.81);Difficulty in walking, not elsewhere classified (R26.2);Repeated falls (R29.6)    Time: 1610-9604 PT Time Calculation (min) (ACUTE ONLY): 25 min   Charges:   PT Evaluation $PT Eval Moderate Complexity: 1 Mod   PT General Charges $$ ACUTE PT VISIT: 1 Visit         Dorothye Gathers., PT  Office # [phone]   Guadelupe Leech 07/04/2023, 11:05 AM

## 2023-07-04 NOTE — Plan of Care (Signed)
  Problem: Fluid Volume: Goal: Hemodynamic stability will improve Outcome: Progressing   Problem: Clinical Measurements: Goal: Diagnostic test results will improve Outcome: Progressing   Problem: Respiratory: Goal: Ability to maintain adequate ventilation will improve Outcome: Progressing   Problem: Education: Goal: Knowledge of General Education information will improve Description: Including pain rating scale, medication(s)/side effects and non-pharmacologic comfort measures Outcome: Progressing   Problem: Clinical Measurements: Goal: Ability to maintain clinical measurements within normal limits will improve Outcome: Progressing   Problem: Activity: Goal: Risk for activity intolerance will decrease Outcome: Progressing

## 2023-07-04 NOTE — Evaluation (Signed)
 Clinical/Bedside Swallow Evaluation Patient Details  Name: Danny Duke MRN: [id number] Date of Birth: 12-14-1935  Today's Date: 07/04/2023 Time: SLP Start Time (ACUTE ONLY): 0933 SLP Stop Time (ACUTE ONLY): 0944 SLP Time Calculation (min) (ACUTE ONLY): 11 min  Past Medical History:  Past Medical History:  Diagnosis Date   AAA (abdominal aortic aneurysm) (HCC)    Chronic back pain    Chronic kidney disease    Hypercholesterolemia    Hypertension    Right Lung dx'd 09/2017   s/p RMLobectomy 2019 and adjuvant chemotherapy and SBRT   Past Surgical History:  Past Surgical History:  Procedure Laterality Date   ABDOMINAL AORTIC ENDOVASCULAR STENT GRAFT Right 02/03/2022   Procedure: ABDOMINAL AORTIC ENDOVASCULAR STENT GRAFT;  Surgeon: Margherita Shell, MD;  [facility];  Service: Vascular;  Laterality: Right;   BRONCHIAL NEEDLE ASPIRATION BIOPSY  08/05/2017   Procedure: BRONCHIAL NEEDLE ASPIRATION BIOPSIES;  Surgeon: Marine Sia, MD;  Location: [facility];  Service: Cardiopulmonary;;   ENDOBRONCHIAL ULTRASOUND Bilateral 08/05/2017   Procedure: ENDOBRONCHIAL ULTRASOUND;  Surgeon: Marine Sia, MD;  Location: [facility];  Service: Cardiopulmonary;  Laterality: Bilateral;   IR IMAGING GUIDED PORT INSERTION  12/27/2017   IR REMOVAL TUN ACCESS W/ PORT W/O FL MOD SED  03/25/2020   MEDIASTINOSCOPY N/A 09/19/2017   Procedure: MEDIASTINOSCOPY;  Surgeon: Zelphia Higashi, MD;  Location: [facility] OR;  Service: Thoracic;  Laterality: N/A;   MULTIPLE TOOTH EXTRACTIONS     VIDEO ASSISTED THORACOSCOPY (VATS)/ LOBECTOMY Right 09/19/2017   Procedure: RIGHT VIDEO ASSISTED THORACOSCOPY (VATS)/ LOBECTOMY;  Surgeon: Zelphia Higashi, MD;  Location: [facility] OR;  Service: Thoracic;  Laterality: Right;   HPI:  Danny Duke is a 88 y.o. male with medical history significant of COPD, AAA, CKD, HLD, HTN, recurrent non-small cell lung cancer admitted with confusion, fall. Chest x-ray concerning  for pneumonia on the right in the lower lung space. CT chest abdomen concerning for pneumonia possible aspiration evidence of COPD. MRI No acute intracranial abnormality. MD placed pt on Dys 2/thin.    Assessment / Plan / Recommendation  Clinical Impression  Pt and wife deny prior difficulty swallowing or pna. He is sitting up in chair, conversing and produced strong volitional cough. Overall he appears strong and not deconditioned with adequate reserve. He has upper denture plate and missing majority of lower dentition but eats regular texture at home. He consumed 3 oz of water  via straw x 2 without indications of airway compromise. Mastication of regular solid was timely with complete oral clearance. Pt does have COPD placing him at higher risk for silent aspiration however this therapist suspects he is able to adequately protect his airway. SLP ordered upgraded regular texture (pt/wife in agreement), continue thin. Observed pt taking pills 2 at a time (smaller) and larger pills (single) with straw sips without difficulty. If pt was admitted in the future with pna then would recommend an MBS at that time and discussed this with family. No further ST needed at this time. SLP Visit Diagnosis: Dysphagia, unspecified (R13.10)    Aspiration Risk  Mild aspiration risk (due to COPD)    Diet Recommendation Regular;Thin liquid    Liquid Administration via: Cup;Straw Medication Administration: Whole meds with liquid Supervision: Patient able to self feed Compensations: Small sips/bites;Slow rate Postural Changes: Seated upright at 90 degrees    Other  Recommendations Oral Care Recommendations: Oral care BID    Recommendations for follow up therapy are one component of a multi-disciplinary  discharge planning process, led by the attending physician.  Recommendations may be updated based on patient status, additional functional criteria and insurance authorization.  Follow up Recommendations No SLP follow  up      Assistance Recommended at Discharge    Functional Status Assessment Patient has not had a recent decline in their functional status  Frequency and Duration            Prognosis        Swallow Study   General Date of Onset: 07/04/23 HPI: Danny Duke is a 88 y.o. male with medical history significant of COPD, AAA, CKD, HLD, HTN, recurrent non-small cell lung cancer admitted with confusion, fall. Chest x-ray concerning for pneumonia on the right in the lower lung space. CT chest abdomen concerning for pneumonia possible aspiration evidence of COPD. MRI No acute intracranial abnormality. MD placed pt on Dys 2/thin. Type of Study: Bedside Swallow Evaluation Previous Swallow Assessment: none Diet Prior to this Study: Dysphagia 2 (finely chopped) Temperature Spikes Noted: No Respiratory Status: Room air History of Recent Intubation: No Behavior/Cognition: Alert;Cooperative;Pleasant mood Oral Cavity Assessment: Within Functional Limits Oral Care Completed by SLP: No Oral Cavity - Dentition: Dentures, top (missing majority lower) Vision: Functional for self-feeding Self-Feeding Abilities: Able to feed self Patient Positioning: Upright in chair Baseline Vocal Quality: Normal Volitional Cough: Strong Volitional Swallow: Able to elicit    Oral/Motor/Sensory Function Overall Oral Motor/Sensory Function: Within functional limits   Ice Chips Ice chips: Not tested   Thin Liquid Thin Liquid: Within functional limits Presentation: Cup;Straw    Nectar Thick Nectar Thick Liquid: Not tested   Honey Thick Honey Thick Liquid: Not tested   Puree Puree:  (politely declined applesauce)   Solid     Solid: Within functional limits      Naomia Bachelor 07/04/2023,10:03 AM

## 2023-07-04 NOTE — TOC Initial Note (Addendum)
 Transition of Care [facility]) - Initial/Assessment Note    Patient Details  Name: Danny Duke MRN: [id number] Date of Birth: [birth year]  Transition of [facility]) CM/SW Contact:    Jannice Mends, LCSW Phone Number: 07/04/2023, 3:46 PM  Clinical Narrative:                 3:46pm-CSW received consult for possible SNF placement at time of discharge as [facility] is unable to accept. CSW left voicemail for patient's spouse.    4:01 PM-CSW received return call from patient's spouse. She reported that patient is typically independent at home. She stated being wary about patient agreeing to SNF placement as she would not be able to stay with him. She is hopeful for progression home with home health and DME. CSW will continue to follow.   Skilled Nursing Rehab Facilities-   [email or web address]   Ratings out of 5 stars (5 the highest)   Name Address  Phone # Quality Care Staffing Health Inspection Overall  [facility] [address] [phone] 3 3 4 4   [facility] [address], [place] [phone] 5 1 4 4   [facility] [address], [facility] [phone] [facility] [address] [place] [phone] 5 2 4 5   [facility]  [address], [place] [phone] 4 3 5 5   [facility] [address] [facility] [phone] 4 2 2 2   [facility] [address], [place] [phone] 5 1 2 2   [facility] [address], [place] [phone] 2 1 3 2   [address] ([place]) [address], [place] [phone] 2 3 3 3   [facility] [address] [place], [place] [phone] 3 3 2 2   [facility] ([place]) [address], [place] [phone] 3 1 1 1   [facility] [address] [place] [phone] 2 3 4 4   [facility] [address], [place] [phone] 4 4 3 3   [facility] ([place]) [address],  [place] [phone] 1 2 4 3           [facility] [address], [place] [phone] 2 1 4 3   [facility] [address] [place] [phone] 4 2 1 1   [facility]  [address] [place], [place] [address] [phone] 2 2 2 2   [facility] [address] [phone] 3 2 4 4   [facility] [address] [place] [phone] 2 1 2 1   [facility] (No [employer]) [address], [place] [phone] 5 4 5 5   [facility] [address], [facility] [phone] 3 4 2 2   [facility] [address] [place] [phone] 2 1 1 1   [facility] [address] [place]ita [place] [phone] 4 2 5 5   [facility] [address], [place] [phone] 4 1 1 1   [facility] [address] [place], [place] [phone] 2 2 3 3           [facility]  [phone] 3 1 1 1   [facility] [address], Albertine Alpha  [phone] 3 3 3 3   [facility] (No [employer]) [address] [place], [place] [phone] 2 2 4 4   [facility]) [address], [place] [phone] 2 1 1 1   [facility] [address], [place] [phone] 4 3 5 5   [facility] [address], [place] [phone] 1 1 1  1  [facility] [address] [place], [place] [phone] 5 4 5 5   [facility])  [address] [place] [phone] 1 1 2 1   [facility]) [address], [place] [phone]  2 4 4   [facility] [place] [address] [place] [phone] 3 5 5 5   [address] [place], [place] [phone] 4 2 2 2   [facility]) [address] [place] [phone] 2 1 3 2      Expected Discharge Plan: Skilled Nursing Facility Barriers to Discharge: Continued Medical Work up, English as a second language teacher, SNF Pending bed offer   Patient Goals and CMS Choice Patient states their goals for this hospitalization and  ongoing recovery are:: Rehab CMS Medicare.gov Compare Post Acute Care list provided to:: Patient Represenative (must comment) Choice offered to / list presented to : Spouse [place] ownership interest in [facility].provided to:: Spouse    Expected Discharge Plan and Services In-house Referral: Clinical Social Work   Post Acute Care Choice: Skilled Nursing Facility Living arrangements for the past 2 months: Single Family Home                                      Prior Living Arrangements/Services Living arrangements for the past 2 months: Single Family Home Lives with:: Spouse Patient language and need for interpreter reviewed:: Yes Do you feel safe going back to the place where you live?: Yes      Need for Family Participation in Patient Care: Yes (Comment) Care giver support system in place?: Yes (comment)   Criminal Activity/Legal Involvement Pertinent to Current Situation/Hospitalization: No - Comment as needed  Activities of Daily Living   ADL Screening (condition at time of admission) Independently performs ADLs?: Yes (appropriate for developmental age) Is the patient deaf or have difficulty hearing?: No Does the patient have difficulty seeing, even when wearing glasses/contacts?: No Does the patient have difficulty concentrating, remembering, or making decisions?: No  Permission Sought/Granted Permission sought to share information with : Facility Medical sales representative, Family Supports Permission granted to share information with : Yes, Verbal Permission Granted  Share Information with NAME: Lorelee Roger  Permission granted to share info w AGENCY: SNFs  Permission granted to share info w Relationship: Spouse  Permission granted to share info w Contact Information: [phone]  Emotional Assessment Appearance:: Appears stated age Attitude/Demeanor/Rapport: Unable to Assess Affect (typically observed): Unable to Assess Orientation: : Oriented to Self,  Oriented to Place, Oriented to Situation Alcohol / Substance Use: Not Applicable Psych Involvement: No (comment)  Admission diagnosis:  Sepsis (HCC) [A41.9] Pneumonia of right lower lobe due to infectious organism [J18.9] Sepsis with encephalopathy without septic shock, due to unspecified organism (HCC) [A41.9, R65.20, G93.41] Patient Active Problem List   Diagnosis Date Noted   Sepsis (HCC) 07/03/2023   Aspiration pneumonia (HCC) 07/03/2023   Debility 07/03/2023   Acute metabolic encephalopathy 07/03/2023   CKD (chronic kidney disease) stage 3, GFR 30-59 ml/min (HCC) 07/03/2023   AAA (abdominal aortic aneurysm) (HCC) 02/03/2022   Chemotherapy-induced neuropathy (HCC) 05/18/2018   Goals of care, counseling/discussion 12/13/2017   Encounter for antineoplastic chemotherapy 12/13/2017   Status post surgery 09/19/2017   S/P lobectomy of lung 09/19/2017   Squamous cell carcinoma lung, right (HCC)    COPD GOLD I  07/13/2017   Essential hypertension 07/13/2017   Mass of middle lobe of right lung 07/12/2017  PCP:  Forest Idol, NP Pharmacy:   [facility] [phone] - [place] Nest[place], [place] - [address] AT [facility] OF [address] [facility] [address] [place] [address] Phone: [phone] Fax: [phone]     Social Drivers of Health (SDOH) Social History: SDOH Screenings   Food Insecurity: No Food Insecurity (07/03/2023)  Housing: Low Risk  (07/03/2023)  Transportation Needs: No Transportation Needs (07/03/2023)  Utilities: Not At Risk (07/03/2023)  Social Connections: Moderately Integrated (07/03/2023)  Tobacco Use: Medium Risk (07/03/2023)   SDOH Interventions:     Readmission Risk Interventions     No data to display

## 2023-07-04 NOTE — Evaluation (Signed)
 Occupational Therapy Evaluation Patient Details Name: Danny Duke MRN: [id number] DOB: 05-26-1935 Today's Date: 07/04/2023   History of Present Illness   Pt is an 88 y.o. male admitted 4/27 with sepsis and PNA. He has been falling at home and had confusion. PMH:  COPD, AAA, CKD, HLD, HTN, recurrent non-small cell lung cancer     Clinical Impressions Pt reports ind at baseline with ADLs/functional mobility, pt lives at home with spouse. Pt currently needing up to min A for ADLs, CGA for bed mobility and CGA for transfers with RW. Pt reports feeling mildly weak, per spouse cognition is impaired from baseline (mild). Pt presenting with impairments listed below, will follow acutely. Recommend HHOT at d/c.      If plan is discharge home, recommend the following:   A little help with walking and/or transfers;A little help with bathing/dressing/bathroom;Assistance with cooking/housework;Assist for transportation;Help with stairs or ramp for entrance     Functional Status Assessment   Patient has had a recent decline in their functional status and demonstrates the ability to make significant improvements in function in a reasonable and predictable amount of time.     Equipment Recommendations   Tub/shower seat;Other (comment) (RW)     Recommendations for Other Services         Precautions/Restrictions   Precautions Precautions: Fall Recall of Precautions/Restrictions: Impaired Restrictions Weight Bearing Restrictions Per Provider Order: No     Mobility Bed Mobility Overal bed mobility: Needs Assistance Bed Mobility: Supine to Sit, Sit to Supine     Supine to sit: Contact guard Sit to supine: Contact guard assist        Transfers Overall transfer level: Needs assistance Equipment used: Rolling walker (2 wheels) Transfers: Sit to/from Stand Sit to Stand: Contact guard assist                  Balance Overall balance assessment: Needs  assistance Sitting-balance support: Bilateral upper extremity supported, Feet supported Sitting balance-Leahy Scale: Good Sitting balance - Comments: reaches down toward feet to don shoes   Standing balance support: Bilateral upper extremity supported, Reliant on assistive device for balance, During functional activity Standing balance-Leahy Scale: Poor                             ADL either performed or assessed with clinical judgement   ADL Overall ADL's : Needs assistance/impaired Eating/Feeding: Set up;Sitting   Grooming: Sitting;Contact guard assist   Upper Body Bathing: Sitting;Standing;Minimal assistance   Lower Body Bathing: Minimal assistance;Sitting/lateral leans;Sit to/from stand   Upper Body Dressing : Contact guard assist;Standing;Sitting   Lower Body Dressing: Contact guard assist Lower Body Dressing Details (indicate cue type and reason): donning shoes Toilet Transfer: Contact guard assist;Ambulation;Rolling walker (2 wheels)   Toileting- Clothing Manipulation and Hygiene: Moderate assistance Toileting - Clothing Manipulation Details (indicate cue type and reason): catheter     Functional mobility during ADLs: Contact guard assist;Rolling walker (2 wheels)       Vision   Vision Assessment?: No apparent visual deficits     Perception Perception: Not tested       Praxis Praxis: Not tested       Pertinent Vitals/Pain Pain Assessment Pain Assessment: No/denies pain     Extremity/Trunk Assessment Upper Extremity Assessment Upper Extremity Assessment: Generalized weakness   Lower Extremity Assessment Lower Extremity Assessment: Defer to PT evaluation   Cervical / Trunk Assessment Cervical / Trunk Assessment: Kyphotic  Communication Communication Communication: No apparent difficulties   Cognition Arousal: Alert Behavior During Therapy: WFL for tasks assessed/performed               OT - Cognition Comments: slower to  respond, ?HOH, pt reports pt not yet cognitively at baseline.                 Following commands: Intact       Cueing  General Comments   Cueing Techniques: Verbal cues  VSS on RA   Exercises     Shoulder Instructions      Home Living Family/patient expects to be discharged to:: Private residence Living Arrangements: Spouse/significant other Available Help at Discharge: Family;Available 24 hours/day Type of Home: House Home Access: Stairs to enter [employer] of Steps: 5 Entrance Stairs-Rails: Right;Left Home Layout: One level     Bathroom Shower/Tub: Chief Strategy Officer: Standard     Home Equipment: None          Prior Functioning/Environment Prior Level of Function : Independent/Modified Independent;Driving;History of Falls (last six months)             Mobility Comments: no AD at baseline ADLs Comments: ind, manages own meds, cooks    OT Problem List: Decreased strength;Decreased range of motion;Decreased activity tolerance;Impaired balance (sitting and/or standing);Decreased safety awareness   OT Treatment/Interventions: Self-care/ADL training;Therapeutic exercise;Energy conservation;DME and/or AE instruction;Therapeutic activities;Balance training;Patient/family education      OT Goals(Current goals can be found in the care plan section)   Acute Rehab OT Goals Patient Stated Goal: none stated OT Goal Formulation: With patient Time For Goal Achievement: 07/18/23 Potential to Achieve Goals: Good ADL Goals Pt Will Perform Upper Body Dressing: Independently;sitting Pt Will Perform Lower Body Dressing: Independently;sitting/lateral leans;sit to/from stand Pt Will Transfer to Toilet: Independently;ambulating;regular height toilet Pt Will Perform Tub/Shower Transfer: Tub transfer;Shower transfer;Independently;ambulating;shower seat   OT Frequency:  Min 2X/week    Co-evaluation              AM-PAC OT "6  Clicks" Daily Activity     Outcome Measure Help from another person eating meals?: A Little Help from another person taking care of personal grooming?: A Little Help from another person toileting, which includes using toliet, bedpan, or urinal?: A Lot Help from another person bathing (including washing, rinsing, drying)?: A Lot Help from another person to put on and taking off regular upper body clothing?: A Little Help from another person to put on and taking off regular lower body clothing?: A Little 6 Click Score: 16   End of Session Equipment Utilized During Treatment: Gait belt;Rolling walker (2 wheels) Nurse Communication: Mobility status  Activity Tolerance: Patient tolerated treatment well Patient left: in bed;with call bell/phone within reach;with bed alarm set;with family/visitor present  OT Visit Diagnosis: Unsteadiness on feet (R26.81);Other abnormalities of gait and mobility (R26.89);Muscle weakness (generalized) (M62.81);History of falling (Z91.81)                Time: 1610-9604 OT Time Calculation (min): 20 min Charges:  OT General Charges $OT Visit: 1 Visit OT Evaluation $OT Eval Low Complexity: 1 Low  Aalaysia Liggins K, OTD, OTR/L [employer]Chat Preferred Acute Rehab [phone]   Ezekial Arns K Koonce 07/04/2023, 3:30 PM

## 2023-07-04 NOTE — Progress Notes (Addendum)
 Initial Nutrition Assessment  DOCUMENTATION CODES:   Non-severe (moderate) malnutrition in context of chronic illness  INTERVENTION:  Ensure Enlive po BID, each supplement provides 350 kcal and 20 grams of protein.  MVI w/ minerals Continue regular diet to encourage PO/calorie intake   NUTRITION DIAGNOSIS:  Moderate Malnutrition related to chronic illness (lung cancer, CKDIII, HLD, HTN) as evidenced by mild muscle depletion, mild fat depletion, moderate muscle depletion, percent weight loss.  GOAL:  Patient will meet greater than or equal to 90% of their needs  MONITOR:  PO intake, Supplement acceptance  REASON FOR ASSESSMENT:  Consult Assessment of nutrition requirement/status  ASSESSMENT:  Pt with PMH significant for: non-small cell cancer of the lung, AAA, HTN, HLD, COPD, CKDIII. Presented to ED with c/o recentl (multiple) falls, cough and reported fever. Found to have severe sepsis w/ acute encephalopathy 2/2 PNA.   4/27 admitted 4/27 CT chest/abd: RLL PNA  Discussed importance of protein and calorie intake with wife and patient at bedside and the importance of meeting needs to promote progress with therapy and prevent reduction in lean body mass.   Average Meal Intake No documented intake to review  Wife endorses he has had a poor appetite for approximately one week since he became ill. Intake at baseline prior to that and can be reviewed below. Had consumed approximately 50% of his breakfast meal with tray at bedside. Wife is ordering meals.  24 Hour Recall B: eggs, grits, bacon or sausage w/ juice L: peanut butter and banana sandwich or Lunchable D: steak, salad, beans or sweet potato QHS snack: ice cream or cookie Supplements: 1-2 Boost or Ensure daily  No difficulties chewing or swallowing at baseline, despite some missing teeth. No recent N/V. Did endorse some recent diarrhea. Noted with low potassium requiring repletion.    Admit/Current Weight: 70.4kg  UBW  reported as around 170lbs 3-4 months ago. This is a 9% weight loss and considered clinically significant for the time frame reviewed. Per chart review, this trend is not confirmed, but shows weight down trend. No edema on exam. Wife reports no history of this. No skin breakdown.   Intake/Output Summary (Last 24 hours) at 07/04/2023 1439 Last data filed at 07/04/2023 1340 Gross per 24 hour  Intake 1816.01 ml  Output 1570 ml  Net 246.01 ml     Drains/Lines: Urinary catheter UOP: 1L x24 hours  Meds: ABX, gabapentin , pantoprazole , KCl  Labs:  Na+ 133>136>136 (wdl) K+ 3.6>3.2 (L) CBGs 117-125 x24 hours    NUTRITION - FOCUSED PHYSICAL EXAM:  Flowsheet Row Most Recent Value  Orbital Region Mild depletion  Upper Arm Region Mild depletion  Thoracic and Lumbar Region No depletion  Buccal Region Mild depletion  Temple Region Mild depletion  Clavicle Bone Region Moderate depletion  Clavicle and Acromion Bone Region Mild depletion  Scapular Bone Region Mild depletion  Dorsal Hand No depletion  Patellar Region Mild depletion  Anterior Thigh Region Mild depletion  Posterior Calf Region Moderate depletion  Edema (RD Assessment) None  Hair Reviewed  Eyes Reviewed  Mouth Reviewed  Skin Reviewed  Nails Reviewed   Diet Order:   Diet Order             Diet regular Room service appropriate? Yes with Assist; Fluid consistency: Thin  Diet effective now             EDUCATION NEEDS:   Education needs have been addressed  Skin:  Skin Assessment: Reviewed RN Assessment  Last BM:  4/28 -  type 7 x1  Height:  Ht Readings from Last 1 Encounters:  07/03/23 5\' 8"  (1.727 m)   Weight:  Wt Readings from Last 1 Encounters:  07/03/23 70.4 kg   Ideal Body Weight:  70 kg  BMI:  Body mass index is 23.6 kg/m.  Estimated Nutritional Needs:   Kcal:  1700-1900kcal  Protein:  85-100g  Fluid:  >1.7L/day  Con Decant MS, RD, LDN Registered Dietitian Clinical Nutrition RD  Inpatient Contact Info in Amion

## 2023-07-04 NOTE — Progress Notes (Signed)
 PROGRESS NOTE        PATIENT DETAILS Name: Danny Duke Age: 88 y.o. Sex: male Date of Birth: 1936/03/03 Admit Date: 07/03/2023 Admitting Physician Selene Dais, MD JYN:WGNFA, Herminia Lope, NP  Brief Summary: Patient is a 88 y.o.  male with history of non-small cell cancer of the lung-on observation, HTN, HLD, COPD, CKD stage IIIa-who presented to the ED on 4/27 (second visit) for fall/weakness/cough/subjective fever-found to have severe sepsis physiology with acute encephalopathy secondary to pneumonia  Significant events: 4/27>> admit to [facility]  Significant studies: 4/27>> MRI brain: No acute intracranial abnormality 4/27>> CT chest/abdomen: RLL pneumonia-no acute intra-abdominal/intrapelvic process.  Significant microbiology data: 4/27>> COVID/influenza/RSV PCR: Negative 4/27>> blood culture: No growth  Procedures: None  Consults: None  Subjective: Some cough-no nausea/vomiting or diarrhea.  Per spouse-is more weak than usual baseline-and has had intermittent fever for the past several days.  Objective: Vitals: Blood pressure 121/63, pulse (!) 106, temperature 99.7 F (37.6 C), temperature source Oral, resp. rate 20, height 5\' 8"  (1.727 m), weight 70.4 kg, SpO2 94%.   Exam: Gen Exam:Alert awake-not in any distress-frail appearing. HEENT:atraumatic, normocephalic Chest: B/L clear to auscultation anteriorly CVS:S1S2 regular Abdomen:soft non tender, non distended Extremities:no edema Neurology: Non focal-generalized weakness Skin: no rash  Pertinent Labs/Radiology:    Latest Ref Rng & Units 07/04/2023    8:43 AM 07/03/2023    3:17 PM 07/03/2023    2:22 PM  CBC  WBC 4.0 - 10.5 K/uL 9.9   10.5   Hemoglobin 13.0 - 17.0 g/dL 21.3  08.6  57.8   Hematocrit 39.0 - 52.0 % 34.0  38.0  37.8   Platelets 150 - 400 K/uL 127   135     Lab Results  Component Value Date   NA 136 07/03/2023   K 3.6 07/03/2023   CL 102 07/03/2023   CO2 22 07/03/2023      Assessment/Plan: Severe sepsis secondary to pneumonia Sepsis physiology improved-although still with low-grade fever overnight. Given colopathy-suspicion that this may have been aspiration PNA. Encephalopathy has essentially resolved Continue Unasyn/Zithromax Follow cultures SLP eval Mobilize with PT/OT Incentive spirometry/flutter valve.  Acute metabolic encephalopathy Neuroimaging negative for CVA Encephalopathy likely secondary to PNA-improved with supportive care-answering all my questions appropriately this morning  Hypokalemia Replete/recheck  COPD Not wheezing-stable As needed bronchodilators.  HTN BP stable Amlodipine /metoprolol  on hold-resume when able  HLD Continue statin  History of AAA-s/p endovascular stent graft placement Aspirin /statin Resume beta-blocker in the next several days.  History of non-small cell cancer of the lung s/p lobectomy/chemo/radiation in 2019 Currently on observation. Follows with Dr. Marguerita Shih  Peripheral neuropathy Per prior notes-secondary to chemotherapy Cymbalta /Neurontin   Debility/deconditioning Secondary to acute illness Second fall in within a week PT/OT eval pending.  Nutrition Status: Nutrition Problem: Moderate Malnutrition Etiology: chronic illness (lung cancer, CKDIII, HLD, HTN) Signs/Symptoms: mild muscle depletion, mild fat depletion, moderate muscle depletion, percent weight loss Percent weight loss: 9 % Interventions: Ensure Enlive (each supplement provides 350kcal and 20 grams of protein), MVI ;L  BMI: Estimated body mass index is 23.6 kg/m as calculated from the following:   Height as of this encounter: 5\' 8"  (1.727 m).   Weight as of this encounter: 70.4 kg.   Code status:   Code Status: Full Code   DVT Prophylaxis: SCDs Start: 07/04/23 0731   Family Communication: None at bedside  Disposition Plan: Status is: Observation The patient will require care spanning > 2 midnights and should  be moved to inpatient because: Severity of illness   Planned Discharge Destination:Home health versus SNF   Diet: Diet Order             DIET DYS 2 Room service appropriate? Yes; Fluid consistency: Thin  Diet effective now                     Antimicrobial agents: Anti-infectives (From admission, onward)    Start     Dose/Rate Route Frequency Ordered Stop   07/04/23 1800  azithromycin (ZITHROMAX) 500 mg in sodium chloride  0.9 % 250 mL IVPB        500 mg 250 mL/hr over 60 Minutes Intravenous Every 24 hours 07/03/23 1928     07/03/23 2200  Ampicillin-Sulbactam (UNASYN) 3 g in sodium chloride  0.9 % 100 mL IVPB        3 g 200 mL/hr over 30 Minutes Intravenous Every 6 hours 07/03/23 1939     07/03/23 1345  ceFEPIme (MAXIPIME) 2 g in sodium chloride  0.9 % 100 mL IVPB        2 g 200 mL/hr over 30 Minutes Intravenous  Once 07/03/23 1339 07/03/23 1523   07/03/23 1345  metroNIDAZOLE (FLAGYL) IVPB 500 mg        500 mg 100 mL/hr over 60 Minutes Intravenous  Once 07/03/23 1339 07/03/23 1542   07/03/23 1345  vancomycin (VANCOCIN) IVPB 1000 mg/200 mL premix  Status:  Discontinued        1,000 mg 200 mL/hr over 60 Minutes Intravenous  Once 07/03/23 1339 07/03/23 1343   07/03/23 1345  vancomycin (VANCOREADY) IVPB 1500 mg/300 mL        1,500 mg 150 mL/hr over 120 Minutes Intravenous  Once 07/03/23 1343 07/03/23 1807        MEDICATIONS: Scheduled Meds:  aspirin  EC  81 mg Oral Daily   DULoxetine   60 mg Oral Daily   gabapentin   900 mg Oral TID   guaiFENesin   600 mg Oral BID   pantoprazole   40 mg Oral Daily   simvastatin   20 mg Oral Daily   Continuous Infusions:  sodium chloride  75 mL/hr at 07/04/23 0825   sodium chloride  100 mL/hr at 07/03/23 2257   ampicillin-sulbactam (UNASYN) IV 3 g (07/04/23 0925)   azithromycin     PRN Meds:.acetaminophen  **OR** acetaminophen , albuterol , fentaNYL  (SUBLIMAZE ) injection, ondansetron  **OR** ondansetron  (ZOFRAN ) IV, oxyCODONE    I have  personally reviewed following labs and imaging studies  LABORATORY DATA: CBC: Recent Labs  Lab 06/30/23 1239 07/03/23 1422 07/03/23 1517 07/04/23 0843  WBC 9.8 10.5  --  9.9  NEUTROABS 8.3* 8.1*  --   --   HGB 12.8* 12.7* 12.9* 11.4*  HCT 39.0 37.8* 38.0* 34.0*  MCV 94.2 92.6  --  91.4  PLT 106* 135*  --  127*    Basic Metabolic Panel: Recent Labs  Lab 06/30/23 1239 07/03/23 1422 07/03/23 1517 07/03/23 2032  NA 135 133* 136  --   K 3.9 3.6 3.6  --   CL 106 102  --   --   CO2 22 22  --   --   GLUCOSE 130* 125*  --   --   BUN 19 14  --   --   CREATININE 1.37* 1.50*  --   --   CALCIUM 9.2 9.2  --   --   MG  --  2.0  --   --   PHOS  --   --   --  2.1*    GFR: Estimated Creatinine Clearance: 34.8 mL/min (A) (by C-G formula based on SCr of 1.5 mg/dL (H)).  Liver Function Tests: Recent Labs  Lab 06/30/23 1239 07/03/23 1422  AST 20 30  ALT 11 13  ALKPHOS 56 57  BILITOT 1.2 1.1  PROT 7.4 7.1  ALBUMIN  3.5 2.8*   No results for input(s): "LIPASE", "AMYLASE" in the last 168 hours. Recent Labs  Lab 07/03/23 2032  AMMONIA 20    Coagulation Profile: Recent Labs  Lab 07/03/23 1422  INR 1.2    Cardiac Enzymes: Recent Labs  Lab 07/03/23 2032  CKTOTAL 289    BNP (last 3 results) No results for input(s): "PROBNP" in the last 8760 hours.  Lipid Profile: No results for input(s): "CHOL", "HDL", "LDLCALC", "TRIG", "CHOLHDL", "LDLDIRECT" in the last 72 hours.  Thyroid Function Tests: Recent Labs    07/03/23 2032  TSH 0.696    Anemia Panel: No results for input(s): "VITAMINB12", "FOLATE", "FERRITIN", "TIBC", "IRON", "RETICCTPCT" in the last 72 hours.  Urine analysis:    Component Value Date/Time   COLORURINE YELLOW 07/03/2023 1540   APPEARANCEUR CLEAR 07/03/2023 1540   LABSPEC 1.017 07/03/2023 1540   PHURINE 5.0 07/03/2023 1540   GLUCOSEU 50 (A) 07/03/2023 1540   HGBUR MODERATE (A) 07/03/2023 1540   BILIRUBINUR NEGATIVE 07/03/2023 1540    KETONESUR NEGATIVE 07/03/2023 1540   PROTEINUR 30 (A) 07/03/2023 1540   NITRITE NEGATIVE 07/03/2023 1540   LEUKOCYTESUR NEGATIVE 07/03/2023 1540    Sepsis Labs: Lactic Acid, Venous    Component Value Date/Time   LATICACIDVEN 1.1 07/04/2023 0026    MICROBIOLOGY: Recent Results (from the past 240 hours)  Resp panel by RT-PCR (RSV, Flu A&B, Covid) Anterior Nasal Swab     Status: None   Collection Time: 06/30/23 12:39 PM   Specimen: Anterior Nasal Swab  Result Value Ref Range Status   SARS Coronavirus 2 by RT PCR NEGATIVE NEGATIVE Final    Comment: (NOTE) SARS-CoV-2 target nucleic acids are NOT DETECTED.  The SARS-CoV-2 RNA is generally detectable in upper respiratory specimens during the acute phase of infection. The lowest concentration of SARS-CoV-2 viral copies this assay can detect is 138 copies/mL. A negative result does not preclude SARS-Cov-2 infection and should not be used as the sole basis for treatment or other patient management decisions. A negative result may occur with  improper specimen collection/handling, submission of specimen other than nasopharyngeal swab, presence of viral mutation(s) within the areas targeted by this assay, and inadequate number of viral copies(<138 copies/mL). A negative result must be combined with clinical observations, patient history, and epidemiological information. The expected result is Negative.  Fact Sheet for Patients:  BloggerCourse.com  Fact Sheet for Healthcare Providers:  SeriousBroker.it  This test is no t yet approved or cleared by the [place]  FDA and  has been authorized for detection and/or diagnosis of SARS-CoV-2 by FDA under an Emergency Use Authorization (EUA). This EUA will remain  in effect (meaning this test can be used) for the duration of the COVID-19 declaration under Section 564(b)(1) of the Act, 21 U.S.C.section 360bbb-3(b)(1), unless the  authorization is terminated  or revoked sooner.       Influenza A by PCR NEGATIVE NEGATIVE Final   Influenza B by PCR NEGATIVE NEGATIVE Final    Comment: (NOTE) The Xpert Xpress SARS-CoV-2/FLU/RSV plus assay is intended as an aid in  the diagnosis of influenza from Nasopharyngeal swab specimens and should not be used as a sole basis for treatment. Nasal washings and aspirates are unacceptable for Xpert Xpress SARS-CoV-2/FLU/RSV testing.  Fact Sheet for Patients: BloggerCourse.com  Fact Sheet for Healthcare Providers: SeriousBroker.it  This test is not yet approved or cleared by the [place]  FDA and has been authorized for detection and/or diagnosis of SARS-CoV-2 by FDA under an Emergency Use Authorization (EUA). This EUA will remain in effect (meaning this test can be used) for the duration of the COVID-19 declaration under Section 564(b)(1) of the Act, 21 U.S.C. section 360bbb-3(b)(1), unless the authorization is terminated or revoked.     Resp Syncytial Virus by PCR NEGATIVE NEGATIVE Final    Comment: (NOTE) Fact Sheet for Patients: BloggerCourse.com  Fact Sheet for Healthcare Providers: SeriousBroker.it  This test is not yet approved or cleared by the [place]  FDA and has been authorized for detection and/or diagnosis of SARS-CoV-2 by FDA under an Emergency Use Authorization (EUA). This EUA will remain in effect (meaning this test can be used) for the duration of the COVID-19 declaration under Section 564(b)(1) of the Act, 21 U.S.C. section 360bbb-3(b)(1), unless the authorization is terminated or revoked.  Performed at [facility], [address] [place], [place] [address]   Resp panel by RT-PCR (RSV, Flu A&B, Covid) Anterior Nasal Swab     Status: None   Collection Time: 07/03/23  1:39 PM   Specimen: Anterior Nasal Swab  Result Value  Ref Range Status   SARS Coronavirus 2 by RT PCR NEGATIVE NEGATIVE Final   Influenza A by PCR NEGATIVE NEGATIVE Final   Influenza B by PCR NEGATIVE NEGATIVE Final    Comment: (NOTE) The Xpert Xpress SARS-CoV-2/FLU/RSV plus assay is intended as an aid in the diagnosis of influenza from Nasopharyngeal swab specimens and should not be used as a sole basis for treatment. Nasal washings and aspirates are unacceptable for Xpert Xpress SARS-CoV-2/FLU/RSV testing.  Fact Sheet for Patients: BloggerCourse.com  Fact Sheet for Healthcare Providers: SeriousBroker.it  This test is not yet approved or cleared by the [place]  FDA and has been authorized for detection and/or diagnosis of SARS-CoV-2 by FDA under an Emergency Use Authorization (EUA). This EUA will remain in effect (meaning this test can be used) for the duration of the COVID-19 declaration under Section 564(b)(1) of the Act, 21 U.S.C. section 360bbb-3(b)(1), unless the authorization is terminated or revoked.     Resp Syncytial Virus by PCR NEGATIVE NEGATIVE Final    Comment: (NOTE) Fact Sheet for Patients: BloggerCourse.com  Fact Sheet for Healthcare Providers: SeriousBroker.it  This test is not yet approved or cleared by the [place]  FDA and has been authorized for detection and/or diagnosis of SARS-CoV-2 by FDA under an Emergency Use Authorization (EUA). This EUA will remain in effect (meaning this test can be used) for the duration of the COVID-19 declaration under Section 564(b)(1) of the Act, 21 U.S.C. section 360bbb-3(b)(1), unless the authorization is terminated or revoked.  Performed at [facility], [address], [place], [place] [address]   Blood Culture (routine x 2)     Status: None (Preliminary result)   Collection Time: 07/03/23  1:39 PM   Specimen: BLOOD  Result Value Ref Range Status    Specimen Description BLOOD RIGHT ANTECUBITAL  Final   Special Requests   Final    BOTTLES DRAWN AEROBIC AND ANAEROBIC Blood Culture results may not be optimal due to an inadequate volume  of blood received in culture bottles   Culture   Final    NO GROWTH < 24 HOURS Performed at [facility], [address] [place], [place] [address]    Report Status PENDING  Incomplete  Blood Culture (routine x 2)     Status: None (Preliminary result)   Collection Time: 07/03/23  4:03 PM   Specimen: BLOOD LEFT HAND  Result Value Ref Range Status   Specimen Description BLOOD LEFT HAND  Final   Special Requests   Final    AEROBIC BOTTLE ONLY Blood Culture results may not be optimal due to an inadequate volume of blood received in culture bottles   Culture   Final    NO GROWTH < 24 HOURS Performed at [facility], [address], [place], [place] [address]    Report Status PENDING  Incomplete    RADIOLOGY STUDIES/RESULTS: MR BRAIN WO CONTRAST Result Date: 07/03/2023 CLINICAL DATA:  Initial evaluation for acute neuro deficit, stroke suspected. EXAM: MRI HEAD WITHOUT CONTRAST TECHNIQUE: Multiplanar, multiecho pulse sequences of the brain and surrounding structures were obtained without intravenous contrast. COMPARISON:  CT from earlier the same day. FINDINGS: Brain: Examination moderately to severely degraded by motion artifact. Generalized age-related cerebral atrophy. Patchy and confluent T2/FLAIR hyperintensity involving the periventricular deep white matter both cerebral hemispheres as well as the pons, most characteristic of chronic microvascular ischemic disease, advanced in nature. No evidence for acute or subacute infarct. Gray-white matter differentiation maintained. No areas of chronic cortical infarction. No visible acute or chronic intracranial blood products. No mass lesion, midline shift or mass effect. Mild diffuse ventricular prominence, most like related to underlying atrophy. No  hydrocephalus. No extra-axial fluid collection. Vascular: Major intracranial vascular flow voids are grossly maintained at the skull base. Skull and upper cervical spine: Craniocervical junction grossly within normal limits. Bone marrow signal intensity grossly normal. No scalp soft tissue abnormality. Sinuses/Orbits: Globes orbital soft tissues within normal limits. Paranasal sinuses are largely clear. No significant mastoid effusion. Other: None. IMPRESSION: 1. Motion degraded exam. 2. No acute intracranial abnormality. 3. Age-related cerebral atrophy with advanced chronic microvascular ischemic disease. Electronically Signed   By: Virgia Griffins M.D.   On: 07/03/2023 23:58   CT Head Wo Contrast Result Date: 07/03/2023 CLINICAL DATA:  Mental status change of unknown cause EXAM: CT HEAD WITHOUT CONTRAST TECHNIQUE: Contiguous axial images were obtained from the base of the skull through the vertex without intravenous contrast. RADIATION DOSE REDUCTION: This exam was performed according to the departmental dose-optimization program which includes automated exposure control, adjustment of the mA and/or kV according to patient size and/or use of iterative reconstruction technique. COMPARISON:  06/30/2023 CT.  MRI 10/15/2019. FINDINGS: Brain: No change or acute CT finding. No focal abnormality visible affecting the brainstem or cerebellum. Cerebral hemispheres show chronic small-vessel ischemic changes of the thalami and cerebral hemispheric white matter. No large vessel territory stroke. No mass lesion, hemorrhage, hydrocephalus or extra-axial collection. Mild ventricular prominence is probably secondary to central atrophy. Vascular: There is atherosclerotic calcification of the major vessels at the base of the brain. Skull: Negative Sinuses/Orbits: Clear/normal Other: None IMPRESSION: No acute CT finding. Chronic small-vessel ischemic changes of the thalami and cerebral hemispheric white matter. Mild  ventricular prominence probably secondary to central atrophy. Electronically Signed   By: Bettylou Brunner M.D.   On: 07/03/2023 17:42   CT CHEST ABDOMEN PELVIS WO CONTRAST Result Date: 07/03/2023 CLINICAL DATA:  Sepsis EXAM: CT CHEST, ABDOMEN AND  PELVIS WITHOUT CONTRAST TECHNIQUE: Multidetector CT imaging of the chest, abdomen and pelvis was performed following the standard protocol without IV contrast. RADIATION DOSE REDUCTION: This exam was performed according to the departmental dose-optimization program which includes automated exposure control, adjustment of the mA and/or kV according to patient size and/or use of iterative reconstruction technique. COMPARISON:  07/03/2023, 03/13/2023 FINDINGS: CT CHEST FINDINGS Cardiovascular: Unenhanced imaging of the heart is unremarkable without pericardial effusion. Normal caliber of the thoracic aorta. Atherosclerosis of the aorta and coronary vasculature. Assessment of the vascular lumen cannot be performed without IV contrast. Mediastinum/Nodes: Stable calcified mediastinal and hilar lymph nodes. Thyroid, trachea, and esophagus are grossly unremarkable. Lungs/Pleura: Emphysema, with biapical pleural and parenchymal scarring again noted. Bullous changes are again seen within the right lower lobe, with superimposed airspace disease that has developed since prior study consistent with right lower lobe pneumonia or aspiration. Peripheral subpleural radiation fibrosis within the right lower lobe reference image 28/5. Trace right pleural effusion. No pneumothorax. Central airways are patent. Musculoskeletal: Stable right posterior seventh rib fracture, compatible with insufficiency fracture given adjacent post radiation change. No acute bony abnormalities. Reconstructed images demonstrate no additional findings. CT ABDOMEN PELVIS FINDINGS Hepatobiliary: Subcentimeter hepatic cysts. Otherwise unremarkable unenhanced appearance of the liver and gallbladder. Pancreas:  Unremarkable unenhanced appearance. Spleen: Unremarkable unenhanced appearance. Adrenals/Urinary Tract: Stable bilateral renal cortical cysts, which do not require specific imaging follow-up. No urinary tract calculi or obstructive uropathy within either kidney. Bladder is minimally distended, limiting its evaluation. No gross bladder abnormalities. The adrenals are stable. Stomach/Bowel: No bowel obstruction or ileus. Normal appendix right lower quadrant. Scattered colonic diverticulosis without diverticulitis. No bowel wall thickening or inflammatory change. Vascular/Lymphatic: Endoluminal stent graft within the distal aorta and bilateral iliac arteries. 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 Assessment of the vascular lumen cannot be performed without IV contrast. Atherosclerosis of the aorta and its distal branches. No pathologic adenopathy. Reproductive: Stable enlargement of the prostate. Other: No free fluid or free intraperitoneal gas. No abdominal wall hernia. Musculoskeletal: No acute or destructive bony abnormalities. Reconstructed images demonstrate no additional findings. IMPRESSION: Chest: 1. New right lower lobe airspace disease superimposed upon chronic emphysema and bullous change, consistent with infection or aspiration. Trace right pleural effusion. 2. Stable emphysema with biapical pleural and parenchymal scarring. Stable post radiation fibrosis within the subpleural region right lower lobe posteriorly. 3. Aortic Atherosclerosis (ICD10-I70.0). Coronary artery atherosclerosis. 4. Stable calcified mediastinal and hilar lymph nodes. Abdomen/pelvis: 1. No acute intra-abdominal or intrapelvic process. 2. Endoluminal stent graft within the aorta and bilateral common iliac arteries, with stable left common iliac artery aneurysm. Assessment of the vascular lumen cannot be performed without IV contrast. 3.  Aortic Atherosclerosis (ICD10-I70.0). Electronically Signed   By: Bobbye Burrow M.D.   On: 07/03/2023 17:42   DG Chest Port 1 View Result Date: 07/03/2023 CLINICAL DATA:  Possible sepsis. EXAM: PORTABLE CHEST 1 VIEW COMPARISON:  06/30/2023 chest radiograph.  Chest CT of 03/13/2023 FINDINGS: Single AP view of the chest with patient moderately rotated to the right. Numerous leads and wires project over the chest. Tracheal deviation right secondary to obliquity and volume loss in the right hemithorax. Normal heart size. Trace right pleural fluid or thickening. No pneumothorax. Emphysema, with primarily right-sided pulmonary interstitial thickening. Extensive partially treatment related volume loss and architectural distortion throughout the right hemithorax. When compared to 03/13/2023, superimposed right mid and lower lung airspace disease, increased since 06/30/2023. Biapical pleuroparenchymal scarring, greater right than left. IMPRESSION:  Progressive right mid and lower lung airspace disease/pneumonia, superimposed upon chronic architectural distortion and interstitial thickening. Emphysema (ICD10-J43.9). Electronically Signed   By: Lore Rode M.D.   On: 07/03/2023 14:52     LOS: 0 days   Kimberly Penna, MD  [facility]    To contact the attending provider between 7A-7P or the covering provider during after hours 7P-7A, please log into the web site [email or web address] and access using universal [place] password for that web site. If you do not have the password, please call the hospital operator.  07/04/2023, 9:44 AM

## 2023-07-05 ENCOUNTER — Other Ambulatory Visit: Payer: Self-pay | Admitting: Internal Medicine

## 2023-07-05 DIAGNOSIS — J189 Pneumonia, unspecified organism: Secondary | ICD-10-CM | POA: Diagnosis not present

## 2023-07-05 DIAGNOSIS — A419 Sepsis, unspecified organism: Secondary | ICD-10-CM | POA: Diagnosis not present

## 2023-07-05 DIAGNOSIS — E440 Moderate protein-calorie malnutrition: Secondary | ICD-10-CM | POA: Insufficient documentation

## 2023-07-05 DIAGNOSIS — I714 Abdominal aortic aneurysm, without rupture, unspecified: Secondary | ICD-10-CM | POA: Diagnosis not present

## 2023-07-05 DIAGNOSIS — G9341 Metabolic encephalopathy: Secondary | ICD-10-CM | POA: Diagnosis not present

## 2023-07-05 LAB — BASIC METABOLIC PANEL WITH GFR
Anion gap: 8 (ref 5–15)
BUN: 6 mg/dL — ABNORMAL LOW (ref 8–23)
CO2: 23 mmol/L (ref 22–32)
Calcium: 8.5 mg/dL — ABNORMAL LOW (ref 8.9–10.3)
Chloride: 106 mmol/L (ref 98–111)
Creatinine, Ser: 1.19 mg/dL (ref 0.61–1.24)
GFR, Estimated: 60 mL/min — ABNORMAL LOW (ref >60)
Glucose, Bld: 117 mg/dL — ABNORMAL HIGH (ref 70–99)
Potassium: 3.3 mmol/L — ABNORMAL LOW (ref 3.5–5.1)
Sodium: 137 mmol/L (ref 135–145)

## 2023-07-05 LAB — CBC
HCT: 33.3 % — ABNORMAL LOW (ref 39.0–52.0)
Hemoglobin: 11.3 g/dL — ABNORMAL LOW (ref 13.0–17.0)
MCH: 30.7 pg (ref 26.0–34.0)
MCHC: 33.9 g/dL (ref 30.0–36.0)
MCV: 90.5 fL (ref 80.0–100.0)
Platelets: 138 10*3/uL — ABNORMAL LOW (ref 150–400)
RBC: 3.68 MIL/uL — ABNORMAL LOW (ref 4.22–5.81)
RDW: 13.9 % (ref 11.5–15.5)
WBC: 9.4 10*3/uL (ref 4.0–10.5)
nRBC: 0 % (ref 0.0–0.2)

## 2023-07-05 MED ORDER — ALUM & MAG HYDROXIDE-SIMETH 200-200-20 MG/5ML PO SUSP
30.0000 mL | Freq: Four times a day (QID) | ORAL | Status: DC | PRN
  Administered 2023-07-05 – 2023-07-06 (×4): 30 mL via ORAL
  Filled 2023-07-05 (×4): qty 30

## 2023-07-05 MED ORDER — METOCLOPRAMIDE HCL 5 MG/ML IJ SOLN
5.0000 mg | Freq: Three times a day (TID) | INTRAMUSCULAR | Status: AC | PRN
  Administered 2023-07-05 – 2023-07-06 (×2): 5 mg via INTRAVENOUS
  Filled 2023-07-05 (×2): qty 2

## 2023-07-05 MED ORDER — POTASSIUM CHLORIDE CRYS ER 20 MEQ PO TBCR
40.0000 meq | EXTENDED_RELEASE_TABLET | Freq: Once | ORAL | Status: AC
  Administered 2023-07-05: 40 meq via ORAL
  Filled 2023-07-05: qty 2

## 2023-07-05 MED ORDER — SODIUM CHLORIDE 0.9 % IV BOLUS
250.0000 mL | Freq: Once | INTRAVENOUS | Status: AC
Start: 2023-07-05 — End: 2023-07-05
  Administered 2023-07-05: 250 mL via INTRAVENOUS

## 2023-07-05 NOTE — Progress Notes (Signed)
   07/05/23 1134  Assess: MEWS Score  Temp (!) 102 F (38.9 C)  BP 138/77  MAP (mmHg) 93  Pulse Rate (!) 114  ECG Heart Rate (!) 115  Resp 20  Level of Consciousness Alert  SpO2 95 %  O2 Device Room Air  Assess: MEWS Score  MEWS Temp 2  MEWS Systolic 0  MEWS Pulse 2  MEWS RR 0  MEWS LOC 0  MEWS Score 4  MEWS Score Color Red  Assess: if the MEWS score is Yellow or Red  Were vital signs accurate and taken at a resting state? Yes  Does the patient meet 2 or more of the SIRS criteria? Yes  Does the patient have a confirmed or suspected source of infection? Yes  MEWS guidelines implemented  Yes, red  Treat  MEWS Interventions Considered administering scheduled or prn medications/treatments as ordered  Take Vital Signs  Increase Vital Sign Frequency  Red: Q1hr x2, continue Q4hrs until patient remains green for 12hrs  Escalate  MEWS: Escalate Red: Discuss with charge nurse and notify provider. Consider notifying RRT. If remains red for 2 hours consider need for higher level of care  Notify: Charge Nurse/RN  Name of Charge Nurse/RN Notified Lulu  Provider Notification  Provider Name/Title Ghimire  Date Provider Notified 07/05/23  Time Provider Notified 1136  Method of Notification Call  Notification Reason Change in status  Provider response See new orders  Date of Provider Response 07/05/23  Time of Provider Response 1136  Assess: SIRS CRITERIA  SIRS Temperature  1  SIRS Respirations  0  SIRS Pulse 1  SIRS WBC 0  SIRS Score Sum  2

## 2023-07-05 NOTE — Plan of Care (Signed)
  Problem: Clinical Measurements: Goal: Signs and symptoms of infection will decrease Outcome: Progressing   Problem: Respiratory: Goal: Ability to maintain adequate ventilation will improve Outcome: Progressing   Problem: Clinical Measurements: Goal: Ability to maintain clinical measurements within normal limits will improve Outcome: Progressing Goal: Will remain free from infection Outcome: Progressing

## 2023-07-05 NOTE — NC FL2 (Signed)
 [facility]  MEDICAID FL2 LEVEL OF CARE FORM     IDENTIFICATION  Patient Name: Danny Duke Birthdate: 18-Apr-1935 Sex: male Admission Date (Current Location): 07/03/2023  [facility] and [place] Number:  Producer, television/film/video and Address:  [facility] [place], [address] [place], [place] [address]      Provider Number: [id number]  Attending Physician Name and Address:  Burton Casey, MD  Relative Name and Phone Number:       Current Level of Care: Hospital Recommended Level of Care: Skilled Nursing Facility Prior Approval Number:    Date Approved/Denied:   PASRR Number: [id number]  Discharge Plan: SNF    Current Diagnoses: Patient Active Problem List   Diagnosis Date Noted   Malnutrition of moderate degree 07/05/2023   Sepsis (HCC) 07/03/2023   Aspiration pneumonia (HCC) 07/03/2023   Debility 07/03/2023   Acute metabolic encephalopathy 07/03/2023   CKD (chronic kidney disease) stage 3, GFR 30-59 ml/min (HCC) 07/03/2023   AAA (abdominal aortic aneurysm) (HCC) 02/03/2022   Chemotherapy-induced neuropathy (HCC) 05/18/2018   Goals of care, counseling/discussion 12/13/2017   Encounter for antineoplastic chemotherapy 12/13/2017   Status post surgery 09/19/2017   S/P lobectomy of lung 09/19/2017   Squamous cell carcinoma lung, right (HCC)    COPD GOLD I  07/13/2017   Essential hypertension 07/13/2017   Mass of middle lobe of right lung 07/12/2017    Orientation RESPIRATION BLADDER Height & Weight     Self, Place  Normal Incontinent, External catheter Weight: 155 lb 3.3 oz (70.4 kg) Height:  5\' 8"  (172.7 cm)  BEHAVIORAL SYMPTOMS/MOOD NEUROLOGICAL BOWEL NUTRITION STATUS      Continent Diet (See dc summary)  AMBULATORY STATUS COMMUNICATION OF NEEDS Skin   Limited Assist Verbally Normal                       Personal Care Assistance Level of Assistance  Bathing, Feeding, Dressing Bathing Assistance: Limited assistance Feeding assistance:  Independent Dressing Assistance: Limited assistance     Functional Limitations Info  Sight Sight Info: Impaired        SPECIAL CARE FACTORS FREQUENCY  PT (By licensed PT), OT (By licensed OT)     PT Frequency: 5x/week OT Frequency: 5x/week            Contractures Contractures Info: Not present    Additional Factors Info  Code Status, Allergies, Psychotropic Code Status Info: Full Allergies Info: NKA Psychotropic Info: cymbalta          Current Medications (07/05/2023):  This is the current hospital active medication list Current Facility-Administered Medications  Medication Dose Route Frequency Provider Last Rate Last Admin   acetaminophen  (TYLENOL ) tablet 650 mg  650 mg Oral Q6H PRN Doutova, Anastassia, MD   650 mg at 07/05/23 1141   Or   acetaminophen  (TYLENOL ) suppository 650 mg  650 mg Rectal Q6H PRN Doutova, Anastassia, MD       albuterol  (PROVENTIL ) (2.5 MG/3ML) 0.083% nebulizer solution 2.5 mg  2.5 mg Nebulization Q2H PRN Doutova, Anastassia, MD       allopurinol  (ZYLOPRIM ) tablet 100 mg  100 mg Oral Daily Ghimire, Shanker M, MD   100 mg at 07/05/23 0834   alum & mag hydroxide-simeth (MAALOX/MYLANTA) 200-200-20 MG/5ML suspension 30 mL  30 mL Oral Q6H PRN Burton Casey, MD   30 mL at 07/05/23 1307   Ampicillin-Sulbactam (UNASYN) 3 g in sodium chloride  0.9 % 100 mL IVPB  3 g Intravenous  Q6H Burton Casey, MD 200 mL/hr at 07/05/23 0843 3 g at 07/05/23 0843   aspirin  EC tablet 81 mg  81 mg Oral Daily Doutova, Anastassia, MD   81 mg at 07/05/23 0835   azithromycin (ZITHROMAX) tablet 500 mg  500 mg Oral Daily Ghimire, Shanker M, MD   500 mg at 07/05/23 9562   benzonatate  (TESSALON ) capsule 200 mg  200 mg Oral TID PRN Burton Casey, MD       DULoxetine  (CYMBALTA ) DR capsule 60 mg  60 mg Oral Daily Doutova, Anastassia, MD   60 mg at 07/05/23 1308   enoxaparin  (LOVENOX ) injection 40 mg  40 mg Subcutaneous Q24H Burton Casey, MD   40 mg at 07/05/23 1141    feeding supplement (ENSURE ENLIVE / ENSURE PLUS) liquid 237 mL  237 mL Oral BID BM Ghimire, Estil Heman, MD   237 mL at 07/05/23 1501   fentaNYL  (SUBLIMAZE ) injection 12.5-50 mcg  12.5-50 mcg Intravenous Q2H PRN Doutova, Anastassia, MD       gabapentin  (NEURONTIN ) capsule 900 mg  900 mg Oral TID Doutova, Anastassia, MD   900 mg at 07/05/23 6578   guaiFENesin  (MUCINEX ) 12 hr tablet 600 mg  600 mg Oral BID Doutova, Anastassia, MD   600 mg at 07/05/23 4696   metoCLOPramide  (REGLAN ) injection 5 mg  5 mg Intravenous Q8H PRN Burton Casey, MD   5 mg at 07/05/23 1307   multivitamin with minerals tablet 1 tablet  1 tablet Oral Daily Burton Casey, MD   1 tablet at 07/05/23 2952   ondansetron  (ZOFRAN ) tablet 4 mg  4 mg Oral Q6H PRN Doutova, Anastassia, MD       Or   ondansetron  (ZOFRAN ) injection 4 mg  4 mg Intravenous Q6H PRN Doutova, Anastassia, MD       oxyCODONE  (Oxy IR/ROXICODONE ) immediate release tablet 5 mg  5 mg Oral Q6H PRN Doutova, Anastassia, MD       pantoprazole  (PROTONIX ) EC tablet 40 mg  40 mg Oral Daily Doutova, Anastassia, MD   40 mg at 07/05/23 0834   simvastatin  (ZOCOR ) tablet 20 mg  20 mg Oral Daily Doutova, Anastassia, MD   20 mg at 07/05/23 8413     Discharge Medications: Please see discharge summary for a list of discharge medications.  Relevant Imaging Results:  Relevant Lab Results:   Additional Information SSN: [id number] [place], [place]

## 2023-07-05 NOTE — TOC Progression Note (Signed)
 Transition of Care [facility]) - Progression Note    Patient Details  Name: Danny Duke MRN: [id number] Date of Birth: [birth year]  [facility] CM/SW Contact  Jannice Mends, LCSW Phone Number: 07/05/2023, 3:39 PM  Clinical Narrative:    CSW spoke with patient's spouse to discuss discharge plan. She reported that they are in agreement with SNF for rehab. CSW sent referrals for review.    Expected Discharge Plan: Skilled Nursing Facility Barriers to Discharge: Continued Medical Work up, English as a second language teacher, SNF Pending bed offer  Expected Discharge Plan and Services In-house Referral: Clinical Social Work   Post Acute Care Choice: Skilled Nursing Facility Living arrangements for the past 2 months: Single Family Home                                       Social Determinants of Health (SDOH) Interventions SDOH Screenings   Food Insecurity: No Food Insecurity (07/03/2023)  Housing: Low Risk  (07/03/2023)  Transportation Needs: No Transportation Needs (07/03/2023)  Utilities: Not At Risk (07/03/2023)  Social Connections: Moderately Integrated (07/03/2023)  Tobacco Use: Medium Risk (07/03/2023)    Readmission Risk Interventions     No data to display

## 2023-07-05 NOTE — Progress Notes (Addendum)
   07/05/23 1517  Mobility  Activity Ambulated with assistance in hallway  Level of Assistance Contact guard assist, steadying assist  Assistive Device Front wheel walker  Distance Ambulated (ft) 150 ft  Activity Response Tolerated fair  Mobility Referral Yes  Mobility visit 1 Mobility  Mobility Specialist Start Time (ACUTE ONLY) 1517  Mobility Specialist Stop Time (ACUTE ONLY) 1537  Mobility Specialist Time Calculation (min) (ACUTE ONLY) 20 min   Mobility Specialist: Progress Note  Pre-Mobility:      HR 108 During Mobility: HR 115, SpO2 92% RA Post-Mobility:    HR 105, SpO2 96% RA  Pt agreeable to mobility session - received in bed. Pt able to march in place. Pt was asymptomatic throughout session with no complaints. Returned to chair with all needs met - call bell within reach. Wife Present.   Danny Duke, BS Mobility Specialist Please contact via SecureChat or  Rehab office at [phone].

## 2023-07-05 NOTE — Progress Notes (Signed)
 PROGRESS NOTE        PATIENT DETAILS Name: Danny Duke Age: 88 y.o. Sex: male Date of Birth: [birth year] Admit Date: 07/03/2023 Admitting Physician Selene Dais, MD AYT:[id number], Herminia Lope, NP  Brief Summary: Patient is a 88 y.o.  male with history of non-small cell cancer of the lung-on observation, HTN, HLD, COPD, CKD stage IIIa-who presented to the ED on 4/27 (second visit) for fall/weakness/cough/subjective fever-found to have severe sepsis physiology with acute encephalopathy secondary to pneumonia  Significant events: 4/27>> admit to [facility]  Significant studies: 4/27>> MRI brain: No acute intracranial abnormality 4/27>> CT chest/abdomen: RLL pneumonia-no acute intra-abdominal/intrapelvic process.  Significant microbiology data: 4/27>> COVID/influenza/RSV PCR: Negative 4/27>> blood culture: No growth  Procedures: None  Consults: None  Subjective: Some cough-continues to feel weak-not keen on going to SNF.  Objective: Vitals: Blood pressure 117/71, pulse 93, temperature 98.9 F (37.2 C), temperature source Oral, resp. rate 19, height 5\' 8"  (1.727 m), weight 70.4 kg, SpO2 94%.   Exam: Gen Exam:Alert awake-not in any distress HEENT:atraumatic, normocephalic Chest: B/L clear to auscultation anteriorly CVS:S1S2 regular Abdomen:soft non tender, non distended Extremities:no edema Neurology: Non focal-generalized weakness. Skin: no rash  Pertinent Labs/Radiology:    Latest Ref Rng & Units 07/05/2023    3:45 AM 07/04/2023    8:43 AM 07/03/2023    3:17 PM  CBC  WBC 4.0 - 10.5 K/uL 9.4  9.9    Hemoglobin 13.0 - 17.0 g/dL 09.3  23.5  57.3   Hematocrit 39.0 - 52.0 % 33.3  34.0  38.0   Platelets 150 - 400 K/uL 138  127      Lab Results  Component Value Date   NA 137 07/05/2023   K 3.3 (L) 07/05/2023   CL 106 07/05/2023   CO2 23 07/05/2023     Assessment/Plan: Severe sepsis secondary to pneumonia Low-grade fever overnight but overall  clinically improved Suspicion that this is aspiration pneumonia in the setting of encephalopathy Continue Unasyn/Zithromax Follow cultures Appreciate SLP eval Mobilize with PT/OT Incentive spirometry/flutter valve.  Acute metabolic encephalopathy Secondary to pneumonia-this has resolved-completely awake and alert Neuroimaging negative for CVA.  Hypokalemia Replete/recheck  COPD Not wheezing-stable As needed bronchodilators.  HTN BP stable Amlodipine /metoprolol  on hold-resume when able  HLD Continue statin  History of AAA-s/p endovascular stent graft placement Aspirin /statin Resume beta-blocker in the next several days.  History of non-small cell cancer of the lung s/p lobectomy/chemo/radiation in 2019 Currently on observation. Follows with Dr. Marguerita Shih  Peripheral neuropathy Per prior notes-secondary to chemotherapy Cymbalta /Neurontin   Debility/deconditioning Secondary to acute illness Second fall in within a week PT/OT eval-SNF recommended-patient not keen on going to SNF-family discussion in progress.  Social worker following.  Nutrition Status: Nutrition Problem: Moderate Malnutrition Etiology: chronic illness (lung cancer, CKDIII, HLD, HTN) Signs/Symptoms: mild muscle depletion, mild fat depletion, moderate muscle depletion, percent weight loss Percent weight loss: 9 % Interventions: Ensure Enlive (each supplement provides 350kcal and 20 grams of protein), MVI ;L  BMI: Estimated body mass index is 23.6 kg/m as calculated from the following:   Height as of this encounter: 5\' 8"  (1.727 m).   Weight as of this encounter: 70.4 kg.   Code status:   Code Status: Full Code   DVT Prophylaxis: enoxaparin  (LOVENOX ) injection 40 mg Start: 07/04/23 1100 SCDs Start: 07/04/23 0731   Family Communication: None at bedside  Disposition Plan: Status is: Observation The patient will require care spanning > 2 midnights and should be moved to inpatient because:  Severity of illness   Planned Discharge Destination:Home health versus SNF   Diet: Diet Order             Diet regular Room service appropriate? Yes with Assist; Fluid consistency: Thin  Diet effective now                     Antimicrobial agents: Anti-infectives (From admission, onward)    Start     Dose/Rate Route Frequency Ordered Stop   07/04/23 1800  azithromycin (ZITHROMAX) 500 mg in sodium chloride  0.9 % 250 mL IVPB  Status:  Discontinued        500 mg 250 mL/hr over 60 Minutes Intravenous Every 24 hours 07/03/23 1928 07/04/23 0955   07/04/23 1045  azithromycin (ZITHROMAX) tablet 500 mg        500 mg Oral Daily 07/04/23 0955 07/07/23 0959   07/03/23 2200  Ampicillin-Sulbactam (UNASYN) 3 g in sodium chloride  0.9 % 100 mL IVPB        3 g 200 mL/hr over 30 Minutes Intravenous Every 6 hours 07/03/23 1939 07/08/23 2159   07/03/23 1345  ceFEPIme (MAXIPIME) 2 g in sodium chloride  0.9 % 100 mL IVPB        2 g 200 mL/hr over 30 Minutes Intravenous  Once 07/03/23 1339 07/03/23 1523   07/03/23 1345  metroNIDAZOLE (FLAGYL) IVPB 500 mg        500 mg 100 mL/hr over 60 Minutes Intravenous  Once 07/03/23 1339 07/03/23 1542   07/03/23 1345  vancomycin (VANCOCIN) IVPB 1000 mg/200 mL premix  Status:  Discontinued        1,000 mg 200 mL/hr over 60 Minutes Intravenous  Once 07/03/23 1339 07/03/23 1343   07/03/23 1345  vancomycin (VANCOREADY) IVPB 1500 mg/300 mL        1,500 mg 150 mL/hr over 120 Minutes Intravenous  Once 07/03/23 1343 07/03/23 1807        MEDICATIONS: Scheduled Meds:  allopurinol   100 mg Oral Daily   aspirin  EC  81 mg Oral Daily   azithromycin  500 mg Oral Daily   DULoxetine   60 mg Oral Daily   enoxaparin  (LOVENOX ) injection  40 mg Subcutaneous Q24H   feeding supplement  237 mL Oral BID BM   gabapentin   900 mg Oral TID   guaiFENesin   600 mg Oral BID   multivitamin with minerals  1 tablet Oral Daily   pantoprazole   40 mg Oral Daily   simvastatin   20 mg  Oral Daily   Continuous Infusions:  ampicillin-sulbactam (UNASYN) IV 3 g (07/05/23 0843)   PRN Meds:.acetaminophen  **OR** acetaminophen , albuterol , benzonatate , fentaNYL  (SUBLIMAZE ) injection, ondansetron  **OR** ondansetron  (ZOFRAN ) IV, oxyCODONE    I have personally reviewed following labs and imaging studies  LABORATORY DATA: CBC: Recent Labs  Lab 06/30/23 1239 07/03/23 1422 07/03/23 1517 07/04/23 0843 07/05/23 0345  WBC 9.8 10.5  --  9.9 9.4  NEUTROABS 8.3* 8.1*  --   --   --   HGB 12.8* 12.7* 12.9* 11.4* 11.3*  HCT 39.0 37.8* 38.0* 34.0* 33.3*  MCV 94.2 92.6  --  91.4 90.5  PLT 106* 135*  --  127* 138*    Basic Metabolic Panel: Recent Labs  Lab 06/30/23 1239 07/03/23 1422 07/03/23 1517 07/03/23 2032 07/04/23 0843 07/05/23 0345  NA 135 133* 136  --  136 137  K  3.9 3.6 3.6  --  3.2* 3.3*  CL 106 102  --   --  103 106  CO2 22 22  --   --  22 23  GLUCOSE 130* 125*  --   --  117* 117*  BUN 19 14  --   --  8 6*  CREATININE 1.37* 1.50*  --   --  1.18 1.19  CALCIUM 9.2 9.2  --   --  8.8* 8.5*  MG  --  2.0  --   --  1.8  --   PHOS  --   --   --  2.1* 2.5  --     GFR: Estimated Creatinine Clearance: 43.9 mL/min (by C-G formula based on SCr of 1.19 mg/dL).  Liver Function Tests: Recent Labs  Lab 06/30/23 1239 07/03/23 1422 07/04/23 0843  AST 20 30 31   ALT 11 13 14   ALKPHOS 56 57 52  BILITOT 1.2 1.1 1.4*  PROT 7.4 7.1 6.4*  ALBUMIN  3.5 2.8* 2.3*   No results for input(s): "LIPASE", "AMYLASE" in the last 168 hours. Recent Labs  Lab 07/03/23 2032  AMMONIA 20    Coagulation Profile: Recent Labs  Lab 07/03/23 1422  INR 1.2    Cardiac Enzymes: Recent Labs  Lab 07/03/23 2032  CKTOTAL 289    BNP (last 3 results) No results for input(s): "PROBNP" in the last 8760 hours.  Lipid Profile: No results for input(s): "CHOL", "HDL", "LDLCALC", "TRIG", "CHOLHDL", "LDLDIRECT" in the last 72 hours.  Thyroid Function Tests: Recent Labs    07/03/23 2032   TSH 0.696    Anemia Panel: No results for input(s): "VITAMINB12", "FOLATE", "FERRITIN", "TIBC", "IRON", "RETICCTPCT" in the last 72 hours.  Urine analysis:    Component Value Date/Time   COLORURINE YELLOW 07/03/2023 1540   APPEARANCEUR CLEAR 07/03/2023 1540   LABSPEC 1.017 07/03/2023 1540   PHURINE 5.0 07/03/2023 1540   GLUCOSEU 50 (A) 07/03/2023 1540   HGBUR MODERATE (A) 07/03/2023 1540   BILIRUBINUR NEGATIVE 07/03/2023 1540   KETONESUR NEGATIVE 07/03/2023 1540   PROTEINUR 30 (A) 07/03/2023 1540   NITRITE NEGATIVE 07/03/2023 1540   LEUKOCYTESUR NEGATIVE 07/03/2023 1540    Sepsis Labs: Lactic Acid, Venous    Component Value Date/Time   LATICACIDVEN 1.1 07/04/2023 0026    MICROBIOLOGY: Recent Results (from the past 240 hours)  Resp panel by RT-PCR (RSV, Flu A&B, Covid) Anterior Nasal Swab     Status: None   Collection Time: 06/30/23 12:39 PM   Specimen: Anterior Nasal Swab  Result Value Ref Range Status   SARS Coronavirus 2 by RT PCR NEGATIVE NEGATIVE Final    Comment: (NOTE) SARS-CoV-2 target nucleic acids are NOT DETECTED.  The SARS-CoV-2 RNA is generally detectable in upper respiratory specimens during the acute phase of infection. The lowest concentration of SARS-CoV-2 viral copies this assay can detect is 138 copies/mL. A negative result does not preclude SARS-Cov-2 infection and should not be used as the sole basis for treatment or other patient management decisions. A negative result may occur with  improper specimen collection/handling, submission of specimen other than nasopharyngeal swab, presence of viral mutation(s) within the areas targeted by this assay, and inadequate number of viral copies(<138 copies/mL). A negative result must be combined with clinical observations, patient history, and epidemiological information. The expected result is Negative.  Fact Sheet for Patients:  BloggerCourse.com  Fact Sheet for Healthcare  Providers:  SeriousBroker.it  This test is no t yet approved or cleared by the  [place]  FDA and  has been authorized for detection and/or diagnosis of SARS-CoV-2 by FDA under an Emergency Use Authorization (EUA). This EUA will remain  in effect (meaning this test can be used) for the duration of the COVID-19 declaration under Section 564(b)(1) of the Act, 21 U.S.C.section 360bbb-3(b)(1), unless the authorization is terminated  or revoked sooner.       Influenza A by PCR NEGATIVE NEGATIVE Final   Influenza B by PCR NEGATIVE NEGATIVE Final    Comment: (NOTE) The Xpert Xpress SARS-CoV-2/FLU/RSV plus assay is intended as an aid in the diagnosis of influenza from Nasopharyngeal swab specimens and should not be used as a sole basis for treatment. Nasal washings and aspirates are unacceptable for Xpert Xpress SARS-CoV-2/FLU/RSV testing.  Fact Sheet for Patients: BloggerCourse.com  Fact Sheet for Healthcare Providers: SeriousBroker.it  This test is not yet approved or cleared by the [place]  FDA and has been authorized for detection and/or diagnosis of SARS-CoV-2 by FDA under an Emergency Use Authorization (EUA). This EUA will remain in effect (meaning this test can be used) for the duration of the COVID-19 declaration under Section 564(b)(1) of the Act, 21 U.S.C. section 360bbb-3(b)(1), unless the authorization is terminated or revoked.     Resp Syncytial Virus by PCR NEGATIVE NEGATIVE Final    Comment: (NOTE) Fact Sheet for Patients: BloggerCourse.com  Fact Sheet for Healthcare Providers: SeriousBroker.it  This test is not yet approved or cleared by the [place]  FDA and has been authorized for detection and/or diagnosis of SARS-CoV-2 by FDA under an Emergency Use Authorization (EUA). This EUA will remain in effect (meaning this test can be  used) for the duration of the COVID-19 declaration under Section 564(b)(1) of the Act, 21 U.S.C. section 360bbb-3(b)(1), unless the authorization is terminated or revoked.  Performed at [facility], [address] [place], [place] [address]   Resp panel by RT-PCR (RSV, Flu A&B, Covid) Anterior Nasal Swab     Status: None   Collection Time: 07/03/23  1:39 PM   Specimen: Anterior Nasal Swab  Result Value Ref Range Status   SARS Coronavirus 2 by RT PCR NEGATIVE NEGATIVE Final   Influenza A by PCR NEGATIVE NEGATIVE Final   Influenza B by PCR NEGATIVE NEGATIVE Final    Comment: (NOTE) The Xpert Xpress SARS-CoV-2/FLU/RSV plus assay is intended as an aid in the diagnosis of influenza from Nasopharyngeal swab specimens and should not be used as a sole basis for treatment. Nasal washings and aspirates are unacceptable for Xpert Xpress SARS-CoV-2/FLU/RSV testing.  Fact Sheet for Patients: BloggerCourse.com  Fact Sheet for Healthcare Providers: SeriousBroker.it  This test is not yet approved or cleared by the [place]  FDA and has been authorized for detection and/or diagnosis of SARS-CoV-2 by FDA under an Emergency Use Authorization (EUA). This EUA will remain in effect (meaning this test can be used) for the duration of the COVID-19 declaration under Section 564(b)(1) of the Act, 21 U.S.C. section 360bbb-3(b)(1), unless the authorization is terminated or revoked.     Resp Syncytial Virus by PCR NEGATIVE NEGATIVE Final    Comment: (NOTE) Fact Sheet for Patients: BloggerCourse.com  Fact Sheet for Healthcare Providers: SeriousBroker.it  This test is not yet approved or cleared by the [place]  FDA and has been authorized for detection and/or diagnosis of SARS-CoV-2 by FDA under an Emergency Use Authorization (EUA). This EUA will remain in effect (meaning  this test can be used) for the duration of the COVID-19 declaration under  Section 564(b)(1) of the Act, 21 U.S.C. section 360bbb-3(b)(1), unless the authorization is terminated or revoked.  Performed at [facility], [address] [place], [place] [address]   Blood Culture (routine x 2)     Status: None (Preliminary result)   Collection Time: 07/03/23  1:39 PM   Specimen: BLOOD  Result Value Ref Range Status   Specimen Description BLOOD RIGHT ANTECUBITAL  Final   Special Requests   Final    BOTTLES DRAWN AEROBIC AND ANAEROBIC Blood Culture results may not be optimal due to an inadequate volume of blood received in culture bottles   Culture   Final    NO GROWTH 2 DAYS Performed at [facility], [address], [place], [place] [address]    Report Status PENDING  Incomplete  Blood Culture (routine x 2)     Status: None (Preliminary result)   Collection Time: 07/03/23  4:03 PM   Specimen: BLOOD LEFT HAND  Result Value Ref Range Status   Specimen Description BLOOD LEFT HAND  Final   Special Requests   Final    AEROBIC BOTTLE ONLY Blood Culture results may not be optimal due to an inadequate volume of blood received in culture bottles   Culture   Final    NO GROWTH 2 DAYS Performed at [facility], [address] [place], [place] [address]    Report Status PENDING  Incomplete    RADIOLOGY STUDIES/RESULTS: MR BRAIN WO CONTRAST Result Date: 07/03/2023 CLINICAL DATA:  Initial evaluation for acute neuro deficit, stroke suspected. EXAM: MRI HEAD WITHOUT CONTRAST TECHNIQUE: Multiplanar, multiecho pulse sequences of the brain and surrounding structures were obtained without intravenous contrast. COMPARISON:  CT from earlier the same day. FINDINGS: Brain: Examination moderately to severely degraded by motion artifact. Generalized age-related cerebral atrophy. Patchy and confluent T2/FLAIR hyperintensity involving the periventricular deep white matter both cerebral  hemispheres as well as the pons, most characteristic of chronic microvascular ischemic disease, advanced in nature. No evidence for acute or subacute infarct. Gray-white matter differentiation maintained. No areas of chronic cortical infarction. No visible acute or chronic intracranial blood products. No mass lesion, midline shift or mass effect. Mild diffuse ventricular prominence, most like related to underlying atrophy. No hydrocephalus. No extra-axial fluid collection. Vascular: Major intracranial vascular flow voids are grossly maintained at the skull base. Skull and upper cervical spine: Craniocervical junction grossly within normal limits. Bone marrow signal intensity grossly normal. No scalp soft tissue abnormality. Sinuses/Orbits: Globes orbital soft tissues within normal limits. Paranasal sinuses are largely clear. No significant mastoid effusion. Other: None. IMPRESSION: 1. Motion degraded exam. 2. No acute intracranial abnormality. 3. Age-related cerebral atrophy with advanced chronic microvascular ischemic disease. Electronically Signed   By: Virgia Griffins M.D.   On: 07/03/2023 23:58   CT Head Wo Contrast Result Date: 07/03/2023 CLINICAL DATA:  Mental status change of unknown cause EXAM: CT HEAD WITHOUT CONTRAST TECHNIQUE: Contiguous axial images were obtained from the base of the skull through the vertex without intravenous contrast. RADIATION DOSE REDUCTION: This exam was performed according to the departmental dose-optimization program which includes automated exposure control, adjustment of the mA and/or kV according to patient size and/or use of iterative reconstruction technique. COMPARISON:  06/30/2023 CT.  MRI 10/15/2019. FINDINGS: Brain: No change or acute CT finding. No focal abnormality visible affecting the brainstem or cerebellum. Cerebral hemispheres show chronic small-vessel ischemic changes of the thalami and cerebral hemispheric white matter. No large vessel territory stroke.  No mass lesion, hemorrhage, hydrocephalus or extra-axial collection. Mild ventricular prominence is probably secondary to central atrophy. Vascular: There is atherosclerotic calcification of the major vessels at the base of the brain. Skull: Negative Sinuses/Orbits: Clear/normal Other: None IMPRESSION: No acute CT finding. Chronic small-vessel ischemic changes of the thalami and cerebral hemispheric white matter. Mild ventricular prominence probably secondary to central atrophy. Electronically Signed   By: Bettylou Brunner M.D.   On: 07/03/2023 17:42   CT CHEST ABDOMEN PELVIS WO CONTRAST Result Date: 07/03/2023 CLINICAL DATA:  Sepsis EXAM: CT CHEST, ABDOMEN AND PELVIS WITHOUT CONTRAST TECHNIQUE: Multidetector CT imaging of the chest, abdomen and pelvis was performed following the standard protocol without IV contrast. RADIATION DOSE REDUCTION: This exam was performed according to the departmental dose-optimization program which includes automated exposure control, adjustment of the mA and/or kV according to patient size and/or use of iterative reconstruction technique. COMPARISON:  07/03/2023, 03/13/2023 FINDINGS: CT CHEST FINDINGS Cardiovascular: Unenhanced imaging of the heart is unremarkable without pericardial effusion. Normal caliber of the thoracic aorta. Atherosclerosis of the aorta and coronary vasculature. Assessment of the vascular lumen cannot be performed without IV contrast. Mediastinum/Nodes: Stable calcified mediastinal and hilar lymph nodes. Thyroid, trachea, and esophagus are grossly unremarkable. Lungs/Pleura: Emphysema, with biapical pleural and parenchymal scarring again noted. Bullous changes are again seen within the right lower lobe, with superimposed airspace disease that has developed since prior study consistent with right lower lobe pneumonia or aspiration. Peripheral subpleural radiation fibrosis within the right lower lobe reference image 28/5. Trace right pleural effusion. No  pneumothorax. Central airways are patent. Musculoskeletal: Stable right posterior seventh rib fracture, compatible with insufficiency fracture given adjacent post radiation change. No acute bony abnormalities. Reconstructed images demonstrate no additional findings. CT ABDOMEN PELVIS FINDINGS Hepatobiliary: Subcentimeter hepatic cysts. Otherwise unremarkable unenhanced appearance of the liver and gallbladder. Pancreas: Unremarkable unenhanced appearance. Spleen: Unremarkable unenhanced appearance. Adrenals/Urinary Tract: Stable bilateral renal cortical cysts, which do not require specific imaging follow-up. No urinary tract calculi or obstructive uropathy within either kidney. Bladder is minimally distended, limiting its evaluation. No gross bladder abnormalities. The adrenals are stable. Stomach/Bowel: No bowel obstruction or ileus. Normal appendix right lower quadrant. Scattered colonic diverticulosis without diverticulitis. No bowel wall thickening or inflammatory change. Vascular/Lymphatic: Endoluminal stent graft within the distal aorta and bilateral iliac arteries. 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 Assessment of the vascular lumen cannot be performed without IV contrast. Atherosclerosis of the aorta and its distal branches. No pathologic adenopathy. Reproductive: Stable enlargement of the prostate. Other: No free fluid or free intraperitoneal gas. No abdominal wall hernia. Musculoskeletal: No acute or destructive bony abnormalities. Reconstructed images demonstrate no additional findings. IMPRESSION: Chest: 1. New right lower lobe airspace disease superimposed upon chronic emphysema and bullous change, consistent with infection or aspiration. Trace right pleural effusion. 2. Stable emphysema with biapical pleural and parenchymal scarring. Stable post radiation fibrosis within the subpleural region right lower lobe posteriorly. 3. Aortic Atherosclerosis (ICD10-I70.0). Coronary artery  atherosclerosis. 4. Stable calcified mediastinal and hilar lymph nodes. Abdomen/pelvis: 1. No acute intra-abdominal or intrapelvic process. 2. Endoluminal stent graft within the aorta and bilateral common iliac arteries, with stable left common iliac artery aneurysm. Assessment of the vascular lumen cannot be performed without IV contrast. 3.  Aortic Atherosclerosis (ICD10-I70.0). Electronically Signed   By: Bobbye Burrow M.D.   On: 07/03/2023 17:42   DG Chest Port 1 View Result Date: 07/03/2023 CLINICAL DATA:  Possible sepsis. EXAM: PORTABLE CHEST 1 VIEW COMPARISON:  06/30/2023 chest radiograph.  Chest CT of 03/13/2023 FINDINGS: Single AP view of the chest with patient moderately rotated to the right. Numerous leads and wires project over the chest. Tracheal deviation right secondary to obliquity and volume loss in the right hemithorax. Normal heart size. Trace right pleural fluid or thickening. No pneumothorax. Emphysema, with primarily right-sided pulmonary interstitial thickening. Extensive partially treatment related volume loss and architectural distortion throughout the right hemithorax. When compared to 03/13/2023, superimposed right mid and lower lung airspace disease, increased since 06/30/2023. Biapical pleuroparenchymal scarring, greater right than left. IMPRESSION: Progressive right mid and lower lung airspace disease/pneumonia, superimposed upon chronic architectural distortion and interstitial thickening. Emphysema (ICD10-J43.9). Electronically Signed   By: Lore Rode M.D.   On: 07/03/2023 14:52     LOS: 1 day   Kimberly Penna, MD  [facility]    To contact the attending provider between 7A-7P or the covering provider during after hours 7P-7A, please log into the web site [email or web address] and access using universal [place] password for that web site. If you do not have the password, please call the hospital operator.  07/05/2023, 11:07 AM

## 2023-07-06 ENCOUNTER — Inpatient Hospital Stay (HOSPITAL_COMMUNITY)

## 2023-07-06 DIAGNOSIS — G9341 Metabolic encephalopathy: Secondary | ICD-10-CM | POA: Diagnosis not present

## 2023-07-06 DIAGNOSIS — A419 Sepsis, unspecified organism: Secondary | ICD-10-CM | POA: Diagnosis not present

## 2023-07-06 DIAGNOSIS — I714 Abdominal aortic aneurysm, without rupture, unspecified: Secondary | ICD-10-CM | POA: Diagnosis not present

## 2023-07-06 DIAGNOSIS — J189 Pneumonia, unspecified organism: Secondary | ICD-10-CM | POA: Diagnosis not present

## 2023-07-06 DIAGNOSIS — M7989 Other specified soft tissue disorders: Secondary | ICD-10-CM

## 2023-07-06 LAB — COMPREHENSIVE METABOLIC PANEL WITH GFR
ALT: 20 U/L (ref 0–44)
AST: 42 U/L — ABNORMAL HIGH (ref 15–41)
Albumin: 2.3 g/dL — ABNORMAL LOW (ref 3.5–5.0)
Alkaline Phosphatase: 54 U/L (ref 38–126)
Anion gap: 9 (ref 5–15)
BUN: 8 mg/dL (ref 8–23)
CO2: 22 mmol/L (ref 22–32)
Calcium: 8.6 mg/dL — ABNORMAL LOW (ref 8.9–10.3)
Chloride: 107 mmol/L (ref 98–111)
Creatinine, Ser: 1.24 mg/dL (ref 0.61–1.24)
GFR, Estimated: 57 mL/min — ABNORMAL LOW (ref >60)
Glucose, Bld: 129 mg/dL — ABNORMAL HIGH (ref 70–99)
Potassium: 3.4 mmol/L — ABNORMAL LOW (ref 3.5–5.1)
Sodium: 138 mmol/L (ref 135–145)
Total Bilirubin: 0.5 mg/dL (ref 0.0–1.2)
Total Protein: 6.4 g/dL — ABNORMAL LOW (ref 6.5–8.1)

## 2023-07-06 LAB — RESPIRATORY PANEL BY PCR

## 2023-07-06 LAB — BASIC METABOLIC PANEL WITH GFR
Anion gap: 9 (ref 5–15)
BUN: 7 mg/dL — ABNORMAL LOW (ref 8–23)
CO2: 22 mmol/L (ref 22–32)
Calcium: 8.5 mg/dL — ABNORMAL LOW (ref 8.9–10.3)
Chloride: 106 mmol/L (ref 98–111)
Creatinine, Ser: 1.19 mg/dL (ref 0.61–1.24)
GFR, Estimated: 60 mL/min — ABNORMAL LOW (ref >60)
Glucose, Bld: 114 mg/dL — ABNORMAL HIGH (ref 70–99)
Potassium: 3.6 mmol/L (ref 3.5–5.1)
Sodium: 137 mmol/L (ref 135–145)

## 2023-07-06 LAB — CBC
HCT: 32.2 % — ABNORMAL LOW (ref 39.0–52.0)
Hemoglobin: 11 g/dL — ABNORMAL LOW (ref 13.0–17.0)
MCH: 30.7 pg (ref 26.0–34.0)
MCHC: 34.2 g/dL (ref 30.0–36.0)
MCV: 89.9 fL (ref 80.0–100.0)
Platelets: 167 10*3/uL (ref 150–400)
RBC: 3.58 MIL/uL — ABNORMAL LOW (ref 4.22–5.81)
RDW: 13.9 % (ref 11.5–15.5)
WBC: 9.3 10*3/uL (ref 4.0–10.5)
nRBC: 0 % (ref 0.0–0.2)

## 2023-07-06 LAB — MAGNESIUM: Magnesium: 2.1 mg/dL (ref 1.7–2.4)

## 2023-07-06 LAB — LEGIONELLA PNEUMOPHILA SEROGP 1 UR AG: L. pneumophila Serogp 1 Ur Ag: NEGATIVE

## 2023-07-06 LAB — MRSA NEXT GEN BY PCR, NASAL: MRSA by PCR Next Gen: NOT DETECTED

## 2023-07-06 MED ORDER — GABAPENTIN 400 MG PO CAPS
400.0000 mg | ORAL_CAPSULE | Freq: Three times a day (TID) | ORAL | Status: DC
  Administered 2023-07-06 – 2023-07-08 (×6): 400 mg via ORAL
  Filled 2023-07-06 (×6): qty 1

## 2023-07-06 NOTE — Progress Notes (Signed)
 Venous duplex lower extremity  has been completed. Refer to [facility] under chart review to view preliminary results.   07/06/2023  3:40 PM Syler Norcia, Hollace Lund

## 2023-07-06 NOTE — Progress Notes (Signed)
 PT Cancellation Note  Patient Details Name: Danny Duke MRN: [id number] DOB: [birth year]   Cancelled Treatment:    Reason Eval/Treat Not Completed:  13:13- Pt declined PT session due to having lunch delivered. Acute PT to re-attempt as able.  Orysia Blas, PT, DPT Secure Chat Preferred  Rehab Office [phone]   Alissa April Adela Ades 07/06/2023, 4:11 PM

## 2023-07-06 NOTE — Progress Notes (Signed)
 PROGRESS NOTE        PATIENT DETAILS Name: Danny Duke Age: 88 y.o. Sex: male Date of Birth: 1935-10-31 Admit Date: 07/03/2023 Admitting Physician Selene Dais, MD ZOX:[id number], Herminia Lope, NP  Brief Summary: Patient is a 88 y.o.  male with history of non-small cell cancer of the lung-on observation, HTN, HLD, COPD, CKD stage IIIa-who presented to the ED on 4/27 (second visit) for fall/weakness/cough/subjective fever-found to have severe sepsis physiology with acute encephalopathy secondary to pneumonia  Significant events: 4/27>> admit to [facility]  Significant studies: 4/27>> MRI brain: No acute intracranial abnormality 4/27>> CT chest/abdomen: RLL pneumonia-no acute intra-abdominal/intrapelvic process.  Significant microbiology data: 4/27>> COVID/influenza/RSV PCR: Negative 4/27>> blood culture: No growth  Procedures: None  Consults: None  Subjective: 1 episode of loose stool yesterday.  No abdominal pain.  Some cough.  Objective: Vitals: Blood pressure 127/83, pulse 84, temperature 99 F (37.2 C), temperature source Oral, resp. rate (!) 21, height 5\' 8"  (1.727 m), weight 70.4 kg, SpO2 95%.   Exam: Gen Exam:Alert awake-not in any distress HEENT:atraumatic, normocephalic Chest: B/L clear to auscultation anteriorly CVS:S1S2 regular Abdomen:soft non tender, non distended Extremities:no edema Neurology: Non focal Skin: no rash  Pertinent Labs/Radiology:    Latest Ref Rng & Units 07/06/2023    7:38 AM 07/05/2023    3:45 AM 07/04/2023    8:43 AM  CBC  WBC 4.0 - 10.5 K/uL 9.3  9.4  9.9   Hemoglobin 13.0 - 17.0 g/dL 54.0  98.1  19.1   Hematocrit 39.0 - 52.0 % 32.2  33.3  34.0   Platelets 150 - 400 K/uL 167  138  127     Lab Results  Component Value Date   NA 137 07/06/2023   K 3.6 07/06/2023   CL 106 07/06/2023   CO2 22 07/06/2023     Assessment/Plan: Severe sepsis secondary to pneumonia Overall improved-but had fever overnight No  other sources of infection apparent-apparently had 1 episode of loose stools yesterday but does not sound like this was diarrhea Check MRSA PCR/respiratory virus panel/lower extremity Dopplers For now continue with Unasyn/Zithromax given clinical stability Await above workup-May need antibiotic regimen adjusted-but if he continues to have fever-May need ID input at some point. Continue to mobilize with PT/OT Encourage incentive spirometry/flutter valve use Appreciate SLP eval several days back.  Acute metabolic encephalopathy Secondary to pneumonia-this has resolved-completely awake and alert Neuroimaging negative for CVA.  Hypokalemia Repleted.  COPD Not wheezing-stable As needed bronchodilators.  HTN Stable Amlodipine /metoprolol  on hold-resume when able.  HLD Continue statin  History of AAA-s/p endovascular stent graft placement Aspirin /statin Resume beta-blocker in the next several days.  History of non-small cell cancer of the lung s/p lobectomy/chemo/radiation in 2019 Currently on observation. Follows with Dr. Marguerita Shih  Peripheral neuropathy Per prior notes-secondary to chemotherapy Cymbalta /Neurontin   Debility/deconditioning Secondary to acute illness Second fall in within a week PT/OT eval-SNF recommended-patient initially not keen but after discussion with family-he has now reluctantly consented for SNF.Aaron Aas  Nutrition Status: Nutrition Problem: Moderate Malnutrition Etiology: chronic illness (lung cancer, CKDIII, HLD, HTN) Signs/Symptoms: mild muscle depletion, mild fat depletion, moderate muscle depletion, percent weight loss Percent weight loss: 9 % Interventions: Ensure Enlive (each supplement provides 350kcal and 20 grams of protein), MVI ;L  BMI: Estimated body mass index is 23.6 kg/m as calculated from the following:   Height as of  this encounter: 5\' 8"  (1.727 m).   Weight as of this encounter: 70.4 kg.   Code status:   Code Status: Full Code    DVT Prophylaxis: enoxaparin  (LOVENOX ) injection 40 mg Start: 07/04/23 1100 SCDs Start: 07/04/23 0731   Family Communication: None at bedside   Disposition Plan: Status is: Observation The patient will require care spanning > 2 midnights and should be moved to inpatient because: Severity of illness   Planned Discharge Destination:SNF   Diet: Diet Order             Diet regular Room service appropriate? Yes with Assist; Fluid consistency: Thin  Diet effective now                     Antimicrobial agents: Anti-infectives (From admission, onward)    Start     Dose/Rate Route Frequency Ordered Stop   07/04/23 1800  azithromycin (ZITHROMAX) 500 mg in sodium chloride  0.9 % 250 mL IVPB  Status:  Discontinued        500 mg 250 mL/hr over 60 Minutes Intravenous Every 24 hours 07/03/23 1928 07/04/23 0955   07/04/23 1045  azithromycin (ZITHROMAX) tablet 500 mg        500 mg Oral Daily 07/04/23 0955 07/06/23 0855   07/03/23 2200  Ampicillin-Sulbactam (UNASYN) 3 g in sodium chloride  0.9 % 100 mL IVPB        3 g 200 mL/hr over 30 Minutes Intravenous Every 6 hours 07/03/23 1939 07/08/23 2159   07/03/23 1345  ceFEPIme (MAXIPIME) 2 g in sodium chloride  0.9 % 100 mL IVPB        2 g 200 mL/hr over 30 Minutes Intravenous  Once 07/03/23 1339 07/03/23 1523   07/03/23 1345  metroNIDAZOLE (FLAGYL) IVPB 500 mg        500 mg 100 mL/hr over 60 Minutes Intravenous  Once 07/03/23 1339 07/03/23 1542   07/03/23 1345  vancomycin (VANCOCIN) IVPB 1000 mg/200 mL premix  Status:  Discontinued        1,000 mg 200 mL/hr over 60 Minutes Intravenous  Once 07/03/23 1339 07/03/23 1343   07/03/23 1345  vancomycin (VANCOREADY) IVPB 1500 mg/300 mL        1,500 mg 150 mL/hr over 120 Minutes Intravenous  Once 07/03/23 1343 07/03/23 1807        MEDICATIONS: Scheduled Meds:  allopurinol   100 mg Oral Daily   aspirin  EC  81 mg Oral Daily   DULoxetine   60 mg Oral Daily   enoxaparin  (LOVENOX ) injection   40 mg Subcutaneous Q24H   feeding supplement  237 mL Oral BID BM   gabapentin   900 mg Oral TID   guaiFENesin   600 mg Oral BID   multivitamin with minerals  1 tablet Oral Daily   pantoprazole   40 mg Oral Daily   simvastatin   20 mg Oral Daily   Continuous Infusions:  ampicillin-sulbactam (UNASYN) IV 3 g (07/06/23 0858)   PRN Meds:.acetaminophen  **OR** acetaminophen , albuterol , alum & mag hydroxide-simeth, benzonatate , fentaNYL  (SUBLIMAZE ) injection, ondansetron  **OR** ondansetron  (ZOFRAN ) IV, oxyCODONE    I have personally reviewed following labs and imaging studies  LABORATORY DATA: CBC: Recent Labs  Lab 06/30/23 1239 07/03/23 1422 07/03/23 1517 07/04/23 0843 07/05/23 0345 07/06/23 0738  WBC 9.8 10.5  --  9.9 9.4 9.3  NEUTROABS 8.3* 8.1*  --   --   --   --   HGB 12.8* 12.7* 12.9* 11.4* 11.3* 11.0*  HCT 39.0 37.8* 38.0* 34.0* 33.3* 32.2*  MCV 94.2 92.6  --  91.4 90.5 89.9  PLT 106* 135*  --  127* 138* 167    Basic Metabolic Panel: Recent Labs  Lab 06/30/23 1239 07/03/23 1422 07/03/23 1517 07/03/23 2032 07/04/23 0843 07/05/23 0345 07/06/23 0332  NA 135 133* 136  --  136 137 137  K 3.9 3.6 3.6  --  3.2* 3.3* 3.6  CL 106 102  --   --  103 106 106  CO2 22 22  --   --  22 23 22   GLUCOSE 130* 125*  --   --  117* 117* 114*  BUN 19 14  --   --  8 6* 7*  CREATININE 1.37* 1.50*  --   --  1.18 1.19 1.19  CALCIUM 9.2 9.2  --   --  8.8* 8.5* 8.5*  MG  --  2.0  --   --  1.8  --  2.1  PHOS  --   --   --  2.1* 2.5  --   --     GFR: Estimated Creatinine Clearance: 43.9 mL/min (by C-G formula based on SCr of 1.19 mg/dL).  Liver Function Tests: Recent Labs  Lab 06/30/23 1239 07/03/23 1422 07/04/23 0843  AST 20 30 31   ALT 11 13 14   ALKPHOS 56 57 52  BILITOT 1.2 1.1 1.4*  PROT 7.4 7.1 6.4*  ALBUMIN  3.5 2.8* 2.3*   No results for input(s): "LIPASE", "AMYLASE" in the last 168 hours. Recent Labs  Lab 07/03/23 2032  AMMONIA 20    Coagulation Profile: Recent Labs   Lab 07/03/23 1422  INR 1.2    Cardiac Enzymes: Recent Labs  Lab 07/03/23 2032  CKTOTAL 289    BNP (last 3 results) No results for input(s): "PROBNP" in the last 8760 hours.  Lipid Profile: No results for input(s): "CHOL", "HDL", "LDLCALC", "TRIG", "CHOLHDL", "LDLDIRECT" in the last 72 hours.  Thyroid Function Tests: Recent Labs    07/03/23 2032  TSH 0.696    Anemia Panel: No results for input(s): "VITAMINB12", "FOLATE", "FERRITIN", "TIBC", "IRON", "RETICCTPCT" in the last 72 hours.  Urine analysis:    Component Value Date/Time   COLORURINE YELLOW 07/03/2023 1540   APPEARANCEUR CLEAR 07/03/2023 1540   LABSPEC 1.017 07/03/2023 1540   PHURINE 5.0 07/03/2023 1540   GLUCOSEU 50 (A) 07/03/2023 1540   HGBUR MODERATE (A) 07/03/2023 1540   BILIRUBINUR NEGATIVE 07/03/2023 1540   KETONESUR NEGATIVE 07/03/2023 1540   PROTEINUR 30 (A) 07/03/2023 1540   NITRITE NEGATIVE 07/03/2023 1540   LEUKOCYTESUR NEGATIVE 07/03/2023 1540    Sepsis Labs: Lactic Acid, Venous    Component Value Date/Time   LATICACIDVEN 1.1 07/04/2023 0026    MICROBIOLOGY: Recent Results (from the past 240 hours)  Resp panel by RT-PCR (RSV, Flu A&B, Covid) Anterior Nasal Swab     Status: None   Collection Time: 06/30/23 12:39 PM   Specimen: Anterior Nasal Swab  Result Value Ref Range Status   SARS Coronavirus 2 by RT PCR NEGATIVE NEGATIVE Final    Comment: (NOTE) SARS-CoV-2 target nucleic acids are NOT DETECTED.  The SARS-CoV-2 RNA is generally detectable in upper respiratory specimens during the acute phase of infection. The lowest concentration of SARS-CoV-2 viral copies this assay can detect is 138 copies/mL. A negative result does not preclude SARS-Cov-2 infection and should not be used as the sole basis for treatment or other patient management decisions. A negative result may occur with  improper specimen collection/handling, submission of specimen other than  nasopharyngeal swab, presence  of viral mutation(s) within the areas targeted by this assay, and inadequate number of viral copies(<138 copies/mL). A negative result must be combined with clinical observations, patient history, and epidemiological information. The expected result is Negative.  Fact Sheet for Patients:  BloggerCourse.com  Fact Sheet for Healthcare Providers:  SeriousBroker.it  This test is no t yet approved or cleared by the [place]  FDA and  has been authorized for detection and/or diagnosis of SARS-CoV-2 by FDA under an Emergency Use Authorization (EUA). This EUA will remain  in effect (meaning this test can be used) for the duration of the COVID-19 declaration under Section 564(b)(1) of the Act, 21 U.S.C.section 360bbb-3(b)(1), unless the authorization is terminated  or revoked sooner.       Influenza A by PCR NEGATIVE NEGATIVE Final   Influenza B by PCR NEGATIVE NEGATIVE Final    Comment: (NOTE) The Xpert Xpress SARS-CoV-2/FLU/RSV plus assay is intended as an aid in the diagnosis of influenza from Nasopharyngeal swab specimens and should not be used as a sole basis for treatment. Nasal washings and aspirates are unacceptable for Xpert Xpress SARS-CoV-2/FLU/RSV testing.  Fact Sheet for Patients: BloggerCourse.com  Fact Sheet for Healthcare Providers: SeriousBroker.it  This test is not yet approved or cleared by the [place]  FDA and has been authorized for detection and/or diagnosis of SARS-CoV-2 by FDA under an Emergency Use Authorization (EUA). This EUA will remain in effect (meaning this test can be used) for the duration of the COVID-19 declaration under Section 564(b)(1) of the Act, 21 U.S.C. section 360bbb-3(b)(1), unless the authorization is terminated or revoked.     Resp Syncytial Virus by PCR NEGATIVE NEGATIVE Final    Comment: (NOTE) Fact Sheet for  Patients: BloggerCourse.com  Fact Sheet for Healthcare Providers: SeriousBroker.it  This test is not yet approved or cleared by the [place]  FDA and has been authorized for detection and/or diagnosis of SARS-CoV-2 by FDA under an Emergency Use Authorization (EUA). This EUA will remain in effect (meaning this test can be used) for the duration of the COVID-19 declaration under Section 564(b)(1) of the Act, 21 U.S.C. section 360bbb-3(b)(1), unless the authorization is terminated or revoked.  Performed at [facility], [address] [place], [place] [address]   Resp panel by RT-PCR (RSV, Flu A&B, Covid) Anterior Nasal Swab     Status: None   Collection Time: 07/03/23  1:39 PM   Specimen: Anterior Nasal Swab  Result Value Ref Range Status   SARS Coronavirus 2 by RT PCR NEGATIVE NEGATIVE Final   Influenza A by PCR NEGATIVE NEGATIVE Final   Influenza B by PCR NEGATIVE NEGATIVE Final    Comment: (NOTE) The Xpert Xpress SARS-CoV-2/FLU/RSV plus assay is intended as an aid in the diagnosis of influenza from Nasopharyngeal swab specimens and should not be used as a sole basis for treatment. Nasal washings and aspirates are unacceptable for Xpert Xpress SARS-CoV-2/FLU/RSV testing.  Fact Sheet for Patients: BloggerCourse.com  Fact Sheet for Healthcare Providers: SeriousBroker.it  This test is not yet approved or cleared by the [place]  FDA and has been authorized for detection and/or diagnosis of SARS-CoV-2 by FDA under an Emergency Use Authorization (EUA). This EUA will remain in effect (meaning this test can be used) for the duration of the COVID-19 declaration under Section 564(b)(1) of the Act, 21 U.S.C. section 360bbb-3(b)(1), unless the authorization is terminated or revoked.     Resp Syncytial Virus by PCR NEGATIVE NEGATIVE Final  Comment:  (NOTE) Fact Sheet for Patients: BloggerCourse.com  Fact Sheet for Healthcare Providers: SeriousBroker.it  This test is not yet approved or cleared by the [place]  FDA and has been authorized for detection and/or diagnosis of SARS-CoV-2 by FDA under an Emergency Use Authorization (EUA). This EUA will remain in effect (meaning this test can be used) for the duration of the COVID-19 declaration under Section 564(b)(1) of the Act, 21 U.S.C. section 360bbb-3(b)(1), unless the authorization is terminated or revoked.  Performed at [facility], [address] [place], [place] [address]   Blood Culture (routine x 2)     Status: None (Preliminary result)   Collection Time: 07/03/23  1:39 PM   Specimen: BLOOD  Result Value Ref Range Status   Specimen Description BLOOD RIGHT ANTECUBITAL  Final   Special Requests   Final    BOTTLES DRAWN AEROBIC AND ANAEROBIC Blood Culture results may not be optimal due to an inadequate volume of blood received in culture bottles   Culture   Final    NO GROWTH 3 DAYS Performed at [facility], [address], [place], [place] [address]    Report Status PENDING  Incomplete  Blood Culture (routine x 2)     Status: None (Preliminary result)   Collection Time: 07/03/23  4:03 PM   Specimen: BLOOD LEFT HAND  Result Value Ref Range Status   Specimen Description BLOOD LEFT HAND  Final   Special Requests   Final    AEROBIC BOTTLE ONLY Blood Culture results may not be optimal due to an inadequate volume of blood received in culture bottles   Culture   Final    NO GROWTH 3 DAYS Performed at [facility], [address] [place], [place] [address]    Report Status PENDING  Incomplete    RADIOLOGY STUDIES/RESULTS: No results found.    LOS: 2 days   Kimberly Penna, MD  [facility]    To contact the attending provider between 7A-7P or the covering provider during after hours  7P-7A, please log into the web site [email or web address] and access using universal [place] password for that web site. If you do not have the password, please call the hospital operator.  07/06/2023, 11:14 AM

## 2023-07-06 NOTE — TOC Progression Note (Signed)
 Transition of Care [facility]) - Progression Note    Patient Details  Name: Danny Duke MRN: [id number] Date of Birth: [birth year]  [facility]) CM/SW Contact  Jannice Mends, LCSW Phone Number: 07/06/2023, 4:27 PM  Clinical Narrative:    CSW left voicemail for patient's spouse to discuss SNF options.    Expected Discharge Plan: Skilled Nursing Facility Barriers to Discharge: Continued Medical Work up, English as a second language teacher, SNF Pending bed offer  Expected Discharge Plan and Services In-house Referral: Clinical Social Work   Post Acute Care Choice: Skilled Nursing Facility Living arrangements for the past 2 months: Single Family Home                                       Social Determinants of Health (SDOH) Interventions SDOH Screenings   Food Insecurity: No Food Insecurity (07/03/2023)  Housing: Low Risk  (07/03/2023)  Transportation Needs: No Transportation Needs (07/03/2023)  Utilities: Not At Risk (07/03/2023)  Social Connections: Moderately Integrated (07/03/2023)  Tobacco Use: Medium Risk (07/03/2023)    Readmission Risk Interventions     No data to display

## 2023-07-06 NOTE — Progress Notes (Signed)
 Ok to reduce gabapentin  400mg  TID based on Crcl per Dr. Hilton Lucky.  Ivery Marking, PharmD, BCIDP, AAHIVP, CPP Infectious Disease Pharmacist 07/06/2023 1:24 PM

## 2023-07-06 NOTE — Plan of Care (Signed)
  Problem: Clinical Measurements: Goal: Diagnostic test results will improve Outcome: Progressing Goal: Signs and symptoms of infection will decrease Outcome: Progressing   Problem: Respiratory: Goal: Ability to maintain adequate ventilation will improve Outcome: Progressing   Problem: Education: Goal: Knowledge of General Education information will improve Description: Including pain rating scale, medication(s)/side effects and non-pharmacologic comfort measures Outcome: Progressing   Problem: Clinical Measurements: Goal: Respiratory complications will improve Outcome: Progressing   Problem: Nutrition: Goal: Adequate nutrition will be maintained Outcome: Progressing   Problem: Coping: Goal: Level of anxiety will decrease Outcome: Progressing

## 2023-07-06 NOTE — Progress Notes (Addendum)
 Occupational Therapy Treatment Patient Details Name: Danny Duke MRN: [id number] DOB: [birth year] Today's Date: 07/06/2023   History of present illness Pt is an 88 y.o. male admitted 4/27 with sepsis and PNA. He has been falling at home and had confusion. PMH:  COPD, AAA, CKD, HLD, HTN, recurrent non-small cell lung cancer   OT comments  Pt doing well, c/o SOB and decreased activity tolerance with OOB activity, but eager to improve. Pt able to ambulate 100 feet at supervision/CGA level, no LOB, HR got up to 142 during activities, Pt tolerated well. Pt assisted to toilet, able to complete toileting and hygiene at CGA level while standing unsupported, increased effort, moderate labored breathing, HR 140. DC plan has been changed to postacute rehab <3hrs/day to maximize activity tolerance and strength prior to return home, but if Pt continues to improve likely can safely return home with home health follow up, wife will be available 24/7 and son is coming for one week to stay and assist as needed. Will continue to follow acutely to progress as able.       If plan is discharge home, recommend the following:  A little help with walking and/or transfers;A little help with bathing/dressing/bathroom;Assistance with cooking/housework;Assist for transportation;Help with stairs or ramp for entrance   Equipment Recommendations  Tub/shower seat;Other (comment)    Recommendations for Other Services      Precautions / Restrictions Precautions Precautions: Fall Recall of Precautions/Restrictions: Impaired Restrictions Weight Bearing Restrictions Per Provider Order: No       Mobility Bed Mobility Overal bed mobility: Needs Assistance Bed Mobility: Supine to Sit, Sit to Supine     Supine to sit: Supervision Sit to supine: Supervision   General bed mobility comments: supervision for safety    Transfers Overall transfer level: Needs assistance Equipment used: Rolling walker (2  wheels) Transfers: Sit to/from Stand, Bed to chair/wheelchair/BSC Sit to Stand: Contact guard assist     Step pivot transfers: Contact guard assist     General transfer comment: CGA for safety, no LOB     Balance Overall balance assessment: Needs assistance Sitting-balance support: No upper extremity supported, Feet supported Sitting balance-Leahy Scale: Good     Standing balance support: No upper extremity supported, During functional activity Standing balance-Leahy Scale: Fair Standing balance comment: able to stand unsupported washing hands, performing toileting, no LOB. Able to bend down while standing, no LOB.                           ADL either performed or assessed with clinical judgement   ADL Overall ADL's : Needs assistance/impaired Eating/Feeding: Independent   Grooming: Contact guard assist;Standing           Upper Body Dressing : Contact guard assist;Standing;Sitting   Lower Body Dressing: Contact guard assist   Toilet Transfer: Contact guard assist;Ambulation;Rolling walker (2 wheels)   Toileting- Clothing Manipulation and Hygiene: Contact guard assist;Sit to/from stand       Functional mobility during ADLs: Contact guard assist;Rolling walker (2 wheels) General ADL Comments: Pt able to complete ADLs at supervision/CGA level, HR is high around 130-142 during activities but able to complete tasks without physical assist.    Extremity/Trunk Assessment Upper Extremity Assessment Upper Extremity Assessment: Overall WFL for tasks assessed            Vision       Perception     Praxis     Communication Communication Communication: No apparent difficulties  Cognition Arousal: Alert Behavior During Therapy: WFL for tasks assessed/performed Cognition: No apparent impairments                               Following commands: Intact        Cueing   Cueing Techniques: Verbal cues  Exercises      Shoulder  Instructions       General Comments      Pertinent Vitals/ Pain       Pain Assessment Pain Assessment: No/denies pain  Home Living                                          Prior Functioning/Environment              Frequency  Min 2X/week        Progress Toward Goals  OT Goals(current goals can now be found in the care plan section)  Progress towards OT goals: Progressing toward goals  Acute Rehab OT Goals Patient Stated Goal: to improve activity tolerance and strength OT Goal Formulation: With patient/family Time For Goal Achievement: 07/18/23 Potential to Achieve Goals: Good ADL Goals Pt Will Perform Upper Body Dressing: Independently;sitting Pt Will Perform Lower Body Dressing: Independently;sitting/lateral leans;sit to/from stand Pt Will Transfer to Toilet: Independently;ambulating;regular height toilet Pt Will Perform Tub/Shower Transfer: Tub transfer;Shower transfer;Independently;ambulating;shower seat  Plan      Co-evaluation                 AM-PAC OT "6 Clicks" Daily Activity     Outcome Measure   Help from another person eating meals?: None Help from another person taking care of personal grooming?: A Little Help from another person toileting, which includes using toliet, bedpan, or urinal?: A Little Help from another person bathing (including washing, rinsing, drying)?: A Little Help from another person to put on and taking off regular upper body clothing?: A Little Help from another person to put on and taking off regular lower body clothing?: A Little 6 Click Score: 19    End of Session Equipment Utilized During Treatment: Gait belt;Rolling walker (2 wheels)  OT Visit Diagnosis: Unsteadiness on feet (R26.81);Other abnormalities of gait and mobility (R26.89);Muscle weakness (generalized) (M62.81);History of falling (Z91.81)   Activity Tolerance Patient tolerated treatment well   Patient Left in bed;with call bell/phone  within reach;with nursing/sitter in room;with family/visitor present   Nurse Communication Mobility status        Time: 4098-1191 OT Time Calculation (min): 33 min  Charges: OT General Charges $OT Visit: 1 Visit OT Treatments $Self Care/Home Management : 23-37 mins  [place], OTR/L   Scherry Curtis 07/06/2023, 3:35 PM

## 2023-07-07 DIAGNOSIS — E440 Moderate protein-calorie malnutrition: Secondary | ICD-10-CM

## 2023-07-07 DIAGNOSIS — I714 Abdominal aortic aneurysm, without rupture, unspecified: Secondary | ICD-10-CM | POA: Diagnosis not present

## 2023-07-07 DIAGNOSIS — A419 Sepsis, unspecified organism: Secondary | ICD-10-CM | POA: Diagnosis not present

## 2023-07-07 DIAGNOSIS — J189 Pneumonia, unspecified organism: Secondary | ICD-10-CM | POA: Diagnosis not present

## 2023-07-07 LAB — CBC
HCT: 32.4 % — ABNORMAL LOW (ref 39.0–52.0)
Hemoglobin: 10.8 g/dL — ABNORMAL LOW (ref 13.0–17.0)
MCH: 30.3 pg (ref 26.0–34.0)
MCHC: 33.3 g/dL (ref 30.0–36.0)
MCV: 90.8 fL (ref 80.0–100.0)
Platelets: 175 10*3/uL (ref 150–400)
RBC: 3.57 MIL/uL — ABNORMAL LOW (ref 4.22–5.81)
RDW: 13.9 % (ref 11.5–15.5)
WBC: 8 10*3/uL (ref 4.0–10.5)
nRBC: 0 % (ref 0.0–0.2)

## 2023-07-07 LAB — COMPREHENSIVE METABOLIC PANEL WITH GFR
ALT: 25 U/L (ref 0–44)
AST: 46 U/L — ABNORMAL HIGH (ref 15–41)
Albumin: 2.3 g/dL — ABNORMAL LOW (ref 3.5–5.0)
Alkaline Phosphatase: 59 U/L (ref 38–126)
Anion gap: 9 (ref 5–15)
BUN: 8 mg/dL (ref 8–23)
CO2: 23 mmol/L (ref 22–32)
Calcium: 8.5 mg/dL — ABNORMAL LOW (ref 8.9–10.3)
Chloride: 105 mmol/L (ref 98–111)
Creatinine, Ser: 1.17 mg/dL (ref 0.61–1.24)
GFR, Estimated: >60 mL/min (ref >60)
Glucose, Bld: 109 mg/dL — ABNORMAL HIGH (ref 70–99)
Potassium: 3.9 mmol/L (ref 3.5–5.1)
Sodium: 137 mmol/L (ref 135–145)
Total Bilirubin: 0.9 mg/dL (ref 0.0–1.2)
Total Protein: 6.2 g/dL — ABNORMAL LOW (ref 6.5–8.1)

## 2023-07-07 NOTE — TOC Progression Note (Signed)
 Transition of Care [facility]) - Progression Note    Patient Details  Name: Danny Duke MRN: [id number] Date of Birth: [birth year]  [facility]) CM/SW Contact  Jannice Mends, LCSW Phone Number: 07/07/2023, 10:41 AM  Clinical Narrative:    CSW received return call from patient's spouse who reported patient has been doing better and they now prefer home health services and not SNF. Patient has never had home health before and request one in network with his plan. CSW discussed DME needs and she declined, stating that she has a walker at home and her nephew has a rollator they can use. She has a bedside commode that they can use in the shower (CSW made her aware that shower chair is out of pocket cost). CSW updated RNCM and MD. Spouse prepared for discharge tomorrow.    Expected Discharge Plan: Home w Home Health Services Barriers to Discharge: Continued Medical Work up  Expected Discharge Plan and Services In-house Referral: Clinical Social Work   Post Acute Care Choice: Skilled Nursing Facility Living arrangements for the past 2 months: Single Family Home                                       Social Determinants of Health (SDOH) Interventions SDOH Screenings   Food Insecurity: No Food Insecurity (07/03/2023)  Housing: Low Risk  (07/03/2023)  Transportation Needs: No Transportation Needs (07/03/2023)  Utilities: Not At Risk (07/03/2023)  Social Connections: Moderately Integrated (07/03/2023)  Tobacco Use: Medium Risk (07/03/2023)    Readmission Risk Interventions     No data to display

## 2023-07-07 NOTE — Progress Notes (Signed)
   07/07/23 1054  Mobility  Activity  (Seated BUE Exercises)  Level of Assistance Standby assist, set-up cues, supervision of patient - no hands on  Assistive Device None  Range of Motion/Exercises Left arm;Right leg;Active  Activity Response Tolerated well  Mobility Referral Yes  Mobility visit 1 Mobility  Mobility Specialist Start Time (ACUTE ONLY) 1054  Mobility Specialist Stop Time (ACUTE ONLY) 1106  Mobility Specialist Time Calculation (min) (ACUTE ONLY) 12 min   Mobility Specialist: Progress Note  Pre-Mobility:      HR 110, SpO2 94% RA During Mobility: HR 120 Post-Mobility:    HR 113, SpO2 93% RA  Pt agreeable to mobility session - received in chair. Pt with hiccups throughout. Pt did not need repositioning in chair as he was comfortable with foot rest up. Returned to chair with all needs met - call bell within reach.   Seated BUE Exercises (x20) - required cues: shoulder shrugs, finger pulses, arm raises using pillow, over head reaches using pillow.  Isla Mari, BS Mobility Specialist Please contact via SecureChat or  Rehab office at [phone].

## 2023-07-07 NOTE — Plan of Care (Signed)

## 2023-07-07 NOTE — Progress Notes (Signed)
 PROGRESS NOTE        PATIENT DETAILS Name: Danny Duke Age: 88 y.o. Sex: male Date of Birth: Feb 21, 1936 Admit Date: 07/03/2023 Admitting Physician Selene Dais, MD WUJ:[id number], Herminia Lope, NP  Brief Summary: Patient is a 88 y.o.  male with history of non-small cell cancer of the lung-on observation, HTN, HLD, COPD, CKD stage IIIa-who presented to the ED on 4/27 (second visit) for fall/weakness/cough/subjective fever-found to have severe sepsis physiology with acute encephalopathy secondary to pneumonia  Significant events: 4/27>> admit to [facility]  Significant studies: 4/27>> MRI brain: No acute intracranial abnormality 4/27>> CT chest/abdomen: RLL pneumonia-no acute intra-abdominal/intrapelvic process.  Significant microbiology data: 4/27>> COVID/influenza/RSV PCR: Negative 4/27>> blood culture: No growth 4/30>> blood culture: No growth 4/30>> respiratory virus panel: Negative.  Procedures: None  Consults: None  Subjective: Issues overnight-afebrile for past 24 hours now.  Objective: Vitals: Blood pressure 133/83, pulse 95, temperature 98.7 F (37.1 C), temperature source Oral, resp. rate 17, height 5\' 8"  (1.727 m), weight 70.4 kg, SpO2 96%.   Exam: Awake/alert Chest clear to auscultation Abdomen: Soft nontender nondistended Legs: No edema Nonfocal exam.  Pertinent Labs/Radiology:    Latest Ref Rng & Units 07/07/2023    5:08 AM 07/06/2023    7:38 AM 07/05/2023    3:45 AM  CBC  WBC 4.0 - 10.5 K/uL 8.0  9.3  9.4   Hemoglobin 13.0 - 17.0 g/dL 47.8  29.5  62.1   Hematocrit 39.0 - 52.0 % 32.4  32.2  33.3   Platelets 150 - 400 K/uL 175  167  138     Lab Results  Component Value Date   NA 137 07/07/2023   K 3.9 07/07/2023   CL 105 07/07/2023   CO2 23 07/07/2023     Assessment/Plan: Severe sepsis secondary to pneumonia No further fever overnight No other sources of infection apparent Repeat blood cultures-MRSA PCR-respiratory virus  panel all negative Perhaps fever yesterday was an error Continue Unasyn -if afebrile-clinically improved-likely home 5/2. Continue to mobilize with PT/OT Continue to encourage incentive spirometry/flutter valve.    Acute metabolic encephalopathy Secondary to pneumonia-this has resolved-completely awake and alert Neuroimaging negative for CVA.  Hypokalemia Repleted.  COPD Not wheezing-stable As needed bronchodilators.  HTN Stable Amlodipine /metoprolol  on hold-resume when able.  HLD Continue statin  History of AAA-s/p endovascular stent graft placement Aspirin /statin Resume beta-blocker in the next several days.  History of non-small cell cancer of the lung s/p lobectomy/chemo/radiation in 2019 Currently on observation. Follows with Dr. Marguerita Shih  Peripheral neuropathy Per prior notes-secondary to chemotherapy Cymbalta /Neurontin   Debility/deconditioning Secondary to acute illness Second fall in within a week PT/OT eval-SNF recommended-patient has had more discussions with the wife-his son is coming in from [place] -they have decided to go home with home health.    Nutrition Status: Nutrition Problem: Moderate Malnutrition Etiology: chronic illness (lung cancer, CKDIII, HLD, HTN) Signs/Symptoms: mild muscle depletion, mild fat depletion, moderate muscle depletion, percent weight loss Percent weight loss: 9 % Interventions: Ensure Enlive (each supplement provides 350kcal and 20 grams of protein), MVI ;L  BMI: Estimated body mass index is 23.6 kg/m as calculated from the following:   Height as of this encounter: 5\' 8"  (1.727 m).   Weight as of this encounter: 70.4 kg.   Code status:   Code Status: Full Code   DVT Prophylaxis: enoxaparin  (LOVENOX ) injection 40 mg Start: 07/04/23  1100 SCDs Start: 07/04/23 0731   Family Communication: None at bedside   Disposition Plan: Status is: Observation The patient will require care spanning > 2 midnights and should be  moved to inpatient because: Severity of illness   Planned Discharge Destination:SNF   Diet: Diet Order             Diet regular Room service appropriate? Yes with Assist; Fluid consistency: Thin  Diet effective now                     Antimicrobial agents: Anti-infectives (From admission, onward)    Start     Dose/Rate Route Frequency Ordered Stop   07/04/23 1800  azithromycin  (ZITHROMAX ) 500 mg in sodium chloride  0.9 % 250 mL IVPB  Status:  Discontinued        500 mg 250 mL/hr over 60 Minutes Intravenous Every 24 hours 07/03/23 1928 07/04/23 0955   07/04/23 1045  azithromycin  (ZITHROMAX ) tablet 500 mg        500 mg Oral Daily 07/04/23 0955 07/06/23 0855   07/03/23 2200  Ampicillin -Sulbactam (UNASYN ) 3 g in sodium chloride  0.9 % 100 mL IVPB        3 g 200 mL/hr over 30 Minutes Intravenous Every 6 hours 07/03/23 1939 07/08/23 2159   07/03/23 1345  ceFEPIme  (MAXIPIME ) 2 g in sodium chloride  0.9 % 100 mL IVPB        2 g 200 mL/hr over 30 Minutes Intravenous  Once 07/03/23 1339 07/03/23 1523   07/03/23 1345  metroNIDAZOLE  (FLAGYL ) IVPB 500 mg        500 mg 100 mL/hr over 60 Minutes Intravenous  Once 07/03/23 1339 07/03/23 1542   07/03/23 1345  vancomycin  (VANCOCIN ) IVPB 1000 mg/200 mL premix  Status:  Discontinued        1,000 mg 200 mL/hr over 60 Minutes Intravenous  Once 07/03/23 1339 07/03/23 1343   07/03/23 1345  vancomycin  (VANCOREADY) IVPB 1500 mg/300 mL        1,500 mg 150 mL/hr over 120 Minutes Intravenous  Once 07/03/23 1343 07/03/23 1807        MEDICATIONS: Scheduled Meds:  allopurinol   100 mg Oral Daily   aspirin  EC  81 mg Oral Daily   DULoxetine   60 mg Oral Daily   enoxaparin  (LOVENOX ) injection  40 mg Subcutaneous Q24H   feeding supplement  237 mL Oral BID BM   gabapentin   400 mg Oral TID   guaiFENesin   600 mg Oral BID   multivitamin with minerals  1 tablet Oral Daily   pantoprazole   40 mg Oral Daily   simvastatin   20 mg Oral Daily   Continuous  Infusions:  ampicillin -sulbactam (UNASYN ) IV 3 g (07/07/23 0906)   PRN Meds:.acetaminophen  **OR** acetaminophen , albuterol , alum & mag hydroxide-simeth, benzonatate , fentaNYL  (SUBLIMAZE ) injection, ondansetron  **OR** ondansetron  (ZOFRAN ) IV, oxyCODONE    I have personally reviewed following labs and imaging studies  LABORATORY DATA: CBC: Recent Labs  Lab 06/30/23 1239 07/03/23 1422 07/03/23 1517 07/04/23 0843 07/05/23 0345 07/06/23 0738 07/07/23 0508  WBC 9.8 10.5  --  9.9 9.4 9.3 8.0  NEUTROABS 8.3* 8.1*  --   --   --   --   --   HGB 12.8* 12.7* 12.9* 11.4* 11.3* 11.0* 10.8*  HCT 39.0 37.8* 38.0* 34.0* 33.3* 32.2* 32.4*  MCV 94.2 92.6  --  91.4 90.5 89.9 90.8  PLT 106* 135*  --  127* 138* 167 175    Basic Metabolic Panel: Recent  Labs  Lab 07/03/23 1422 07/03/23 1517 07/03/23 2032 07/04/23 0843 07/05/23 0345 07/06/23 0332 07/06/23 1321 07/07/23 0508  NA 133*   < >  --  136 137 137 138 137  K 3.6   < >  --  3.2* 3.3* 3.6 3.4* 3.9  CL 102  --   --  103 106 106 107 105  CO2 22  --   --  22 23 22 22 23   GLUCOSE 125*  --   --  117* 117* 114* 129* 109*  BUN 14  --   --  8 6* 7* 8 8  CREATININE 1.50*  --   --  1.18 1.19 1.19 1.24 1.17  CALCIUM 9.2  --   --  8.8* 8.5* 8.5* 8.6* 8.5*  MG 2.0  --   --  1.8  --  2.1  --   --   PHOS  --   --  2.1* 2.5  --   --   --   --    < > = values in this interval not displayed.    GFR: Estimated Creatinine Clearance: 44.7 mL/min (by C-G formula based on SCr of 1.17 mg/dL).  Liver Function Tests: Recent Labs  Lab 06/30/23 1239 07/03/23 1422 07/04/23 0843 07/06/23 1321 07/07/23 0508  AST 20 30 31  42* 46*  ALT 11 13 14 20 25   ALKPHOS 56 57 52 54 59  BILITOT 1.2 1.1 1.4* 0.5 0.9  PROT 7.4 7.1 6.4* 6.4* 6.2*  ALBUMIN  3.5 2.8* 2.3* 2.3* 2.3*   No results for input(s): "LIPASE", "AMYLASE" in the last 168 hours. Recent Labs  Lab 07/03/23 2032  AMMONIA 20    Coagulation Profile: Recent Labs  Lab 07/03/23 1422  INR 1.2     Cardiac Enzymes: Recent Labs  Lab 07/03/23 2032  CKTOTAL 289    BNP (last 3 results) No results for input(s): "PROBNP" in the last 8760 hours.  Lipid Profile: No results for input(s): "CHOL", "HDL", "LDLCALC", "TRIG", "CHOLHDL", "LDLDIRECT" in the last 72 hours.  Thyroid Function Tests: No results for input(s): "TSH", "T4TOTAL", "FREET4", "T3FREE", "THYROIDAB" in the last 72 hours.   Anemia Panel: No results for input(s): "VITAMINB12", "FOLATE", "FERRITIN", "TIBC", "IRON", "RETICCTPCT" in the last 72 hours.  Urine analysis:    Component Value Date/Time   COLORURINE YELLOW 07/03/2023 1540   APPEARANCEUR CLEAR 07/03/2023 1540   LABSPEC 1.017 07/03/2023 1540   PHURINE 5.0 07/03/2023 1540   GLUCOSEU 50 (A) 07/03/2023 1540   HGBUR MODERATE (A) 07/03/2023 1540   BILIRUBINUR NEGATIVE 07/03/2023 1540   KETONESUR NEGATIVE 07/03/2023 1540   PROTEINUR 30 (A) 07/03/2023 1540   NITRITE NEGATIVE 07/03/2023 1540   LEUKOCYTESUR NEGATIVE 07/03/2023 1540    Sepsis Labs: Lactic Acid, Venous    Component Value Date/Time   LATICACIDVEN 1.1 07/04/2023 0026    MICROBIOLOGY: Recent Results (from the past 240 hours)  Resp panel by RT-PCR (RSV, Flu A&B, Covid) Anterior Nasal Swab     Status: None   Collection Time: 06/30/23 12:39 PM   Specimen: Anterior Nasal Swab  Result Value Ref Range Status   SARS Coronavirus 2 by RT PCR NEGATIVE NEGATIVE Final    Comment: (NOTE) SARS-CoV-2 target nucleic acids are NOT DETECTED.  The SARS-CoV-2 RNA is generally detectable in upper respiratory specimens during the acute phase of infection. The lowest concentration of SARS-CoV-2 viral copies this assay can detect is 138 copies/mL. A negative result does not preclude SARS-Cov-2 infection and should not be used  as the sole basis for treatment or other patient management decisions. A negative result may occur with  improper specimen collection/handling, submission of specimen other than  nasopharyngeal swab, presence of viral mutation(s) within the areas targeted by this assay, and inadequate number of viral copies(<138 copies/mL). A negative result must be combined with clinical observations, patient history, and epidemiological information. The expected result is Negative.  Fact Sheet for Patients:  BloggerCourse.com  Fact Sheet for Healthcare Providers:  SeriousBroker.it  This test is no t yet approved or cleared by the [place]  FDA and  has been authorized for detection and/or diagnosis of SARS-CoV-2 by FDA under an Emergency Use Authorization (EUA). This EUA will remain  in effect (meaning this test can be used) for the duration of the COVID-19 declaration under Section 564(b)(1) of the Act, 21 U.S.C.section 360bbb-3(b)(1), unless the authorization is terminated  or revoked sooner.       Influenza A by PCR NEGATIVE NEGATIVE Final   Influenza B by PCR NEGATIVE NEGATIVE Final    Comment: (NOTE) The Xpert Xpress SARS-CoV-2/FLU/RSV plus assay is intended as an aid in the diagnosis of influenza from Nasopharyngeal swab specimens and should not be used as a sole basis for treatment. Nasal washings and aspirates are unacceptable for Xpert Xpress SARS-CoV-2/FLU/RSV testing.  Fact Sheet for Patients: BloggerCourse.com  Fact Sheet for Healthcare Providers: SeriousBroker.it  This test is not yet approved or cleared by the [place]  FDA and has been authorized for detection and/or diagnosis of SARS-CoV-2 by FDA under an Emergency Use Authorization (EUA). This EUA will remain in effect (meaning this test can be used) for the duration of the COVID-19 declaration under Section 564(b)(1) of the Act, 21 U.S.C. section 360bbb-3(b)(1), unless the authorization is terminated or revoked.     Resp Syncytial Virus by PCR NEGATIVE NEGATIVE Final    Comment:  (NOTE) Fact Sheet for Patients: BloggerCourse.com  Fact Sheet for Healthcare Providers: SeriousBroker.it  This test is not yet approved or cleared by the [place]  FDA and has been authorized for detection and/or diagnosis of SARS-CoV-2 by FDA under an Emergency Use Authorization (EUA). This EUA will remain in effect (meaning this test can be used) for the duration of the COVID-19 declaration under Section 564(b)(1) of the Act, 21 U.S.C. section 360bbb-3(b)(1), unless the authorization is terminated or revoked.  Performed at [facility], [address], [place], [place] [address]   Resp panel by RT-PCR (RSV, Flu A&B, Covid) Anterior Nasal Swab     Status: None   Collection Time: 07/03/23  1:39 PM   Specimen: Anterior Nasal Swab  Result Value Ref Range Status   SARS Coronavirus 2 by RT PCR NEGATIVE NEGATIVE Final   Influenza A by PCR NEGATIVE NEGATIVE Final   Influenza B by PCR NEGATIVE NEGATIVE Final    Comment: (NOTE) The Xpert Xpress SARS-CoV-2/FLU/RSV plus assay is intended as an aid in the diagnosis of influenza from Nasopharyngeal swab specimens and should not be used as a sole basis for treatment. Nasal washings and aspirates are unacceptable for Xpert Xpress SARS-CoV-2/FLU/RSV testing.  Fact Sheet for Patients: BloggerCourse.com  Fact Sheet for Healthcare Providers: SeriousBroker.it  This test is not yet approved or cleared by the [place]  FDA and has been authorized for detection and/or diagnosis of SARS-CoV-2 by FDA under an Emergency Use Authorization (EUA). This EUA will remain in effect (meaning this test can be used) for the duration of the COVID-19 declaration under Section 564(b)(1) of the Act,  21 U.S.C. section 360bbb-3(b)(1), unless the authorization is terminated or revoked.     Resp Syncytial Virus by PCR NEGATIVE NEGATIVE  Final    Comment: (NOTE) Fact Sheet for Patients: BloggerCourse.com  Fact Sheet for Healthcare Providers: SeriousBroker.it  This test is not yet approved or cleared by the [place]  FDA and has been authorized for detection and/or diagnosis of SARS-CoV-2 by FDA under an Emergency Use Authorization (EUA). This EUA will remain in effect (meaning this test can be used) for the duration of the COVID-19 declaration under Section 564(b)(1) of the Act, 21 U.S.C. section 360bbb-3(b)(1), unless the authorization is terminated or revoked.  Performed at [facility], [address] [place], [place] [address]   Blood Culture (routine x 2)     Status: None (Preliminary result)   Collection Time: 07/03/23  1:39 PM   Specimen: BLOOD  Result Value Ref Range Status   Specimen Description BLOOD RIGHT ANTECUBITAL  Final   Special Requests   Final    BOTTLES DRAWN AEROBIC AND ANAEROBIC Blood Culture results may not be optimal due to an inadequate volume of blood received in culture bottles   Culture   Final    NO GROWTH 4 DAYS Performed at [facility], [address] [place], [place] [address]    Report Status PENDING  Incomplete  Blood Culture (routine x 2)     Status: None (Preliminary result)   Collection Time: 07/03/23  4:03 PM   Specimen: BLOOD LEFT HAND  Result Value Ref Range Status   Specimen Description BLOOD LEFT HAND  Final   Special Requests   Final    AEROBIC BOTTLE ONLY Blood Culture results may not be optimal due to an inadequate volume of blood received in culture bottles   Culture   Final    NO GROWTH 4 DAYS Performed at [facility], [address] [place], [place] [address]    Report Status PENDING  Incomplete  Culture, blood (Routine X 2) w Reflex to ID Panel     Status: None (Preliminary result)   Collection Time: 07/06/23 11:45 AM   Specimen: BLOOD RIGHT ARM  Result Value Ref Range Status    Specimen Description BLOOD RIGHT ARM  Final   Special Requests   Final    BOTTLES DRAWN AEROBIC AND ANAEROBIC Blood Culture adequate volume   Culture   Final    NO GROWTH < 24 HOURS Performed at [facility], [address], [place], [place] [address]    Report Status PENDING  Incomplete  Culture, blood (Routine X 2) w Reflex to ID Panel     Status: None (Preliminary result)   Collection Time: 07/06/23 11:51 AM   Specimen: BLOOD RIGHT ARM  Result Value Ref Range Status   Specimen Description BLOOD RIGHT ARM  Final   Special Requests   Final    BOTTLES DRAWN AEROBIC AND ANAEROBIC Blood Culture adequate volume   Culture   Final    NO GROWTH < 24 HOURS Performed at [facility], [address] [place], [place] [address]    Report Status PENDING  Incomplete  MRSA Next Gen by PCR, Nasal     Status: None   Collection Time: 07/06/23  1:34 PM   Specimen: Nasal Mucosa; Nasal Swab  Result Value Ref Range Status   MRSA by PCR Next Gen NOT DETECTED NOT DETECTED Final    Comment: (NOTE) The GeneXpert MRSA Assay (FDA approved  for NASAL specimens only), is one component of a comprehensive MRSA colonization surveillance program. It is not intended to diagnose MRSA infection nor to guide or monitor treatment for MRSA infections. Test performance is not FDA approved in patients less than 88 years old. Performed at [facility], [address], [place], [place] [address]   Respiratory (~20 pathogens) panel by PCR     Status: None   Collection Time: 07/06/23  1:35 PM   Specimen: Nasopharyngeal Swab; Respiratory  Result Value Ref Range Status   Adenovirus NOT DETECTED NOT DETECTED Final   Coronavirus 229E NOT DETECTED NOT DETECTED Final    Comment: (NOTE) The Coronavirus on the Respiratory Panel, DOES NOT test for the novel  Coronavirus (2019 nCoV)    Coronavirus HKU1 NOT DETECTED NOT DETECTED Final   Coronavirus NL63 NOT DETECTED NOT DETECTED Final   Coronavirus OC43 NOT  DETECTED NOT DETECTED Final   Metapneumovirus NOT DETECTED NOT DETECTED Final   Rhinovirus / Enterovirus NOT DETECTED NOT DETECTED Final   Influenza A NOT DETECTED NOT DETECTED Final   Influenza B NOT DETECTED NOT DETECTED Final   Parainfluenza Virus 1 NOT DETECTED NOT DETECTED Final   Parainfluenza Virus 2 NOT DETECTED NOT DETECTED Final   Parainfluenza Virus 3 NOT DETECTED NOT DETECTED Final   Parainfluenza Virus 4 NOT DETECTED NOT DETECTED Final   Respiratory Syncytial Virus NOT DETECTED NOT DETECTED Final   Bordetella pertussis NOT DETECTED NOT DETECTED Final   Bordetella Parapertussis NOT DETECTED NOT DETECTED Final   Chlamydophila pneumoniae NOT DETECTED NOT DETECTED Final   Mycoplasma pneumoniae NOT DETECTED NOT DETECTED Final    Comment: Performed at [facility], [address], [place], [place] [address]    RADIOLOGY STUDIES/RESULTS: VAS US  LOWER EXTREMITY VENOUS (DVT) Result Date: 07/06/2023  Lower Venous DVT Study Patient Name:  Danny Duke  Date of Exam:   07/06/2023 Medical Rec #: [id number]        Accession #:    [id number] Date of Birth: 1935/04/02         Patient Gender: M Patient Age:   71 years Exam Location:  [facility] Procedure:      VAS US  LOWER EXTREMITY VENOUS (DVT) Referring Phys: Kimberly Penna --------------------------------------------------------------------------------  Indications: Swelling, and Recurrent non-small cell lung cancer.  Comparison Study: No priors. Performing Technologist: Franky Ivanoff Sturdivant-Jones RDMS, RVT  Examination Guidelines: A complete evaluation includes B-mode imaging, spectral Doppler, color Doppler, and power Doppler as needed of all accessible portions of each vessel. Bilateral testing is considered an integral part of a complete examination. Limited examinations for reoccurring indications may be performed as noted. The reflux portion of the exam is performed with the patient in reverse Trendelenburg.   +---------+---------------+---------+-----------+----------+--------------+ RIGHT    CompressibilityPhasicitySpontaneityPropertiesThrombus Aging +---------+---------------+---------+-----------+----------+--------------+ CFV      Full           Yes      Yes                                 +---------+---------------+---------+-----------+----------+--------------+ SFJ      Full                                                        +---------+---------------+---------+-----------+----------+--------------+  FV Prox  Full                                                        +---------+---------------+---------+-----------+----------+--------------+ FV Mid   Full                                                        +---------+---------------+---------+-----------+----------+--------------+ FV DistalFull                                                        +---------+---------------+---------+-----------+----------+--------------+ PFV      Full                                                        +---------+---------------+---------+-----------+----------+--------------+ POP      Full           Yes      Yes                                 +---------+---------------+---------+-----------+----------+--------------+ PTV      Full                                                        +---------+---------------+---------+-----------+----------+--------------+ PERO     Full                                                        +---------+---------------+---------+-----------+----------+--------------+   +---------+---------------+---------+-----------+----------+--------------+ LEFT     CompressibilityPhasicitySpontaneityPropertiesThrombus Aging +---------+---------------+---------+-----------+----------+--------------+ CFV      Full           Yes      Yes                                  +---------+---------------+---------+-----------+----------+--------------+ SFJ      Full                                                        +---------+---------------+---------+-----------+----------+--------------+ FV Prox  Full                                                        +---------+---------------+---------+-----------+----------+--------------+  FV Mid   Full                                                        +---------+---------------+---------+-----------+----------+--------------+ FV DistalFull                                                        +---------+---------------+---------+-----------+----------+--------------+ PFV      Full                                                        +---------+---------------+---------+-----------+----------+--------------+ POP      Full           Yes      Yes                                 +---------+---------------+---------+-----------+----------+--------------+ PTV      Full                                                        +---------+---------------+---------+-----------+----------+--------------+ PERO     Full                                                        +---------+---------------+---------+-----------+----------+--------------+     Summary: BILATERAL: - No evidence of deep vein thrombosis seen in the lower extremities, bilaterally. -No evidence of popliteal cyst, bilaterally.   *See table(s) above for measurements and observations. Electronically signed by Genny Kid MD on 07/06/2023 at 8:31:10 PM.    Final       LOS: 3 days   Kimberly Penna, MD  [facility]    To contact the attending provider between 7A-7P or the covering provider during after hours 7P-7A, please log into the web site [email or web address] and access using universal [place] password for that web site. If you do not have the password, please call the hospital operator.  07/07/2023, 11:37  AM

## 2023-07-07 NOTE — Progress Notes (Signed)
 Physical Therapy Treatment Patient Details Name: Danny Duke MRN: [id number] DOB: 1935-12-16 Today's Date: 07/07/2023   History of Present Illness Pt is an 88 y.o. male admitted 4/27 with sepsis and PNA. He has been falling at home and had confusion. PMH:  COPD, AAA, CKD, HLD, HTN, recurrent non-small cell lung cancer    PT Comments  Good progress towards acute functional goals, able to ambulate with RW up to 90 feet with instructions for proper RW use, minor instability but able to correct without physical assist. HR increased to 128, SpO2 91% on RA, mild dyspnea. Feeling confident about returning home with family support which will now be available per reports. Recommend HHPT if family able to provide 24/7 supervision. Patient will continue to benefit from skilled physical therapy services to further improve independence with functional mobility.     If plan is discharge home, recommend the following: A little help with walking and/or transfers;Assistance with cooking/housework;Assist for transportation;Help with stairs or ramp for entrance;A little help with bathing/dressing/bathroom   Can travel by private vehicle        Equipment Recommendations  Rolling walker (2 wheels);BSC/3in1    Recommendations for Other Services       Precautions / Restrictions Precautions Precautions: Fall Recall of Precautions/Restrictions: Impaired Restrictions Weight Bearing Restrictions Per Provider Order: No     Mobility  Bed Mobility Overal bed mobility: Needs Assistance Bed Mobility: Supine to Sit, Sit to Supine     Supine to sit: Supervision Sit to supine: Supervision   General bed mobility comments: Supervision for safety, extra time, fidgets with lines/leads.    Transfers Overall transfer level: Needs assistance Equipment used: Rolling walker (2 wheels) Transfers: Sit to/from Stand Sit to Stand: Contact guard assist           General transfer comment: CGA for safety, slow to  rise but stable with RW upon standing. Cues for hand placement and technique with transitions.    Ambulation/Gait Ambulation/Gait assistance: Contact guard assist Gait Distance (Feet): 90 Feet Assistive device: Rolling walker (2 wheels) Gait Pattern/deviations: Step-through pattern, Decreased stride length, Drifts right/left Gait velocity: decreased Gait velocity interpretation: <1.31 ft/sec, indicative of household ambulator   General Gait Details: Educated on safe AD use with RW for support, cues for proximity to device and awareness of foot placement with turns, shuffling. Able to correct with cues. No overt LOB but CGA for safety due to drift and shuffle with turns causing some noticable instability. HR increased to 128. SpO2 91% on RA.   Stairs             Wheelchair Mobility     Tilt Bed    Modified Rankin (Stroke Patients Only)       Balance Overall balance assessment: Needs assistance Sitting-balance support: No upper extremity supported, Feet supported Sitting balance-Leahy Scale: Good     Standing balance support: No upper extremity supported, During functional activity Standing balance-Leahy Scale: Fair Standing balance comment: able to stand unsupported                            Communication Communication Communication: No apparent difficulties  Cognition Arousal: Alert Behavior During Therapy: WFL for tasks assessed/performed   PT - Cognitive impairments: No family/caregiver present to determine baseline                         Following commands: Intact  Cueing Cueing Techniques: Verbal cues  Exercises      General Comments General comments (skin integrity, edema, etc.): HR 116 sitting EOB, SpO2 92%.      Pertinent Vitals/Pain Pain Assessment Pain Assessment: No/denies pain    Home Living                          Prior Function            PT Goals (current goals can now be found in the care  plan section) Acute Rehab PT Goals Patient Stated Goal: get stronger PT Goal Formulation: With patient/family Time For Goal Achievement: 07/18/23 Potential to Achieve Goals: Good Progress towards PT goals: Progressing toward goals    Frequency    Min 2X/week      PT Plan      Co-evaluation              AM-PAC PT "6 Clicks" Mobility   Outcome Measure  Help needed turning from your back to your side while in a flat bed without using bedrails?: None Help needed moving from lying on your back to sitting on the side of a flat bed without using bedrails?: A Little Help needed moving to and from a bed to a chair (including a wheelchair)?: A Little Help needed standing up from a chair using your arms (e.g., wheelchair or bedside chair)?: A Little Help needed to walk in hospital room?: A Little Help needed climbing 3-5 steps with a railing? : A Little 6 Click Score: 19    End of Session Equipment Utilized During Treatment: Gait belt Activity Tolerance: Patient tolerated treatment well Patient left: with call bell/phone within reach;in bed;with bed alarm set;with family/visitor present (Modified chair position in bed.) Nurse Communication: Mobility status PT Visit Diagnosis: Muscle weakness (generalized) (M62.81);Difficulty in walking, not elsewhere classified (R26.2);Repeated falls (R29.6);Unsteadiness on feet (R26.81)     Time: 5784-6962 PT Time Calculation (min) (ACUTE ONLY): 13 min  Charges:    $Gait Training: 8-22 mins PT General Charges $$ ACUTE PT VISIT: 1 Visit                     Jory Ng, PT, DPT [facility]  Rehabilitation Services Physical Therapist Office: [phone] Website: [email or web address]    Alinda Irani 07/07/2023, 4:49 PM

## 2023-07-08 ENCOUNTER — Other Ambulatory Visit (HOSPITAL_COMMUNITY): Payer: Self-pay

## 2023-07-08 DIAGNOSIS — J690 Pneumonitis due to inhalation of food and vomit: Secondary | ICD-10-CM | POA: Diagnosis not present

## 2023-07-08 DIAGNOSIS — J449 Chronic obstructive pulmonary disease, unspecified: Secondary | ICD-10-CM | POA: Diagnosis not present

## 2023-07-08 DIAGNOSIS — I714 Abdominal aortic aneurysm, without rupture, unspecified: Secondary | ICD-10-CM | POA: Diagnosis not present

## 2023-07-08 DIAGNOSIS — A419 Sepsis, unspecified organism: Secondary | ICD-10-CM | POA: Diagnosis not present

## 2023-07-08 LAB — CULTURE, BLOOD (ROUTINE X 2)
Culture: NO GROWTH
Culture: NO GROWTH

## 2023-07-08 MED ORDER — AMLODIPINE BESYLATE 10 MG PO TABS
5.0000 mg | ORAL_TABLET | Freq: Every day | ORAL | 0 refills | Status: AC
  Filled 2023-07-08: qty 30, 60d supply, fill #0

## 2023-07-08 MED ORDER — AMLODIPINE BESYLATE 5 MG PO TABS
5.0000 mg | ORAL_TABLET | Freq: Every day | ORAL | Status: DC
  Administered 2023-07-08: 5 mg via ORAL
  Filled 2023-07-08: qty 1

## 2023-07-08 MED ORDER — METOPROLOL SUCCINATE ER 25 MG PO TB24: 25.0000 mg | ORAL_TABLET | Freq: Every day | ORAL | Status: DC

## 2023-07-08 MED ORDER — AMOXICILLIN-POT CLAVULANATE 875-125 MG PO TABS
1.0000 | ORAL_TABLET | Freq: Two times a day (BID) | ORAL | 0 refills | Status: AC
  Filled 2023-07-08: qty 4, 2d supply, fill #0

## 2023-07-08 MED ORDER — GABAPENTIN 400 MG PO CAPS
400.0000 mg | ORAL_CAPSULE | Freq: Three times a day (TID) | ORAL | 0 refills | Status: AC
  Filled 2023-07-08: qty 90, 30d supply, fill #0

## 2023-07-08 MED ORDER — METOCLOPRAMIDE HCL 5 MG/ML IJ SOLN: 10.0000 mg | Freq: Three times a day (TID) | INTRAMUSCULAR | Status: DC | PRN

## 2023-07-08 MED ORDER — AMLODIPINE BESYLATE 5 MG PO TABS: 5.0000 mg | ORAL_TABLET | Freq: Every day | ORAL | Status: DC

## 2023-07-08 NOTE — Progress Notes (Signed)
 Pt received home meds from [facility] as well as BSC and rolling walker.   Oral Billings, RN

## 2023-07-08 NOTE — TOC Transition Note (Addendum)
 Transition of Care [facility]) - Discharge Note   Patient Details  Name: Danny Duke MRN: [id number] Date of Birth: Jul 08, 1935  [facility]) CM/SW Contact:  Eusebio High, RN Phone Number: 07/08/2023, 11:08 AM   Clinical Narrative:    Update 11:51 AM  RN notified RNCM that Wife is now requesting the [facility] and RW. [employer] will deliver to the home. Wife states she can get patient safely in the home without the walker . Wife will now transport patient home    Patient to DC to home today. Home Health arranged with [facility] AVS updated  No DME per Wife. Family to transport     Final next level of care: Home w Home Health Services Barriers to Discharge: Continued Medical Work up   Patient Goals and CMS Choice Patient states their goals for this hospitalization and ongoing recovery are:: Rehab CMS Medicare.gov Compare Post Acute Care list provided to:: Patient Represenative (must comment) Choice offered to / list presented to : Spouse [place] ownership interest in [facility].provided to:: Spouse    Discharge Placement                       Discharge Plan and Services Additional resources added to the After Visit Summary for   In-house Referral: Clinical Social Work   Post Acute Care Choice: Skilled Nursing Facility                               Social Drivers of Health (SDOH) Interventions SDOH Screenings   Food Insecurity: No Food Insecurity (07/03/2023)  Housing: Low Risk  (07/03/2023)  Transportation Needs: No Transportation Needs (07/03/2023)  Utilities: Not At Risk (07/03/2023)  Social Connections: Moderately Integrated (07/03/2023)  Tobacco Use: Medium Risk (07/03/2023)     Readmission Risk Interventions     No data to display

## 2023-07-08 NOTE — Plan of Care (Signed)
 Pt has rested quietly throughout the night with no distress noted. Alert and oriented. On room air. SR on the monitor. [place] intact to suction. Up to BR to have BM. Medicated for pain once with relief noted. Wife at bedside. No other complaints voiced.     Problem: Education: Goal: Knowledge of General Education information will improve Description: Including pain rating scale, medication(s)/side effects and non-pharmacologic comfort measures Outcome: Progressing   Problem: Clinical Measurements: Goal: Respiratory complications will improve Outcome: Progressing Goal: Cardiovascular complication will be avoided Outcome: Progressing   Problem: Pain Managment: Goal: General experience of comfort will improve and/or be controlled Outcome: Progressing

## 2023-07-08 NOTE — TOC Progression Note (Signed)
 Transition of Care [facility]) - Progression Note    Patient Details  Name: Danny Duke MRN: [id number] Date of Birth: [birth year]  Transition of Care [facility]) CM/SW Contact  Eusebio High, RN Phone Number: 07/08/2023, 10:06 AM  Clinical Narrative:     Home Health PT and OT have been recommended. [facility] will provide services. AVS has been updated. Patient is declining recommended DME- Wife states they have what they need.   TOC will continue to follow patient for any additional discharge needs      Expected Discharge Plan: Home w Home Health Services Barriers to Discharge: Continued Medical Work up  Expected Discharge Plan and Services In-house Referral: Clinical Social Work   Post Acute Care Choice: Skilled Nursing Facility Living arrangements for the past 2 months: Single Family Home                                       Social Determinants of Health (SDOH) Interventions SDOH Screenings   Food Insecurity: No Food Insecurity (07/03/2023)  Housing: Low Risk  (07/03/2023)  Transportation Needs: No Transportation Needs (07/03/2023)  Utilities: Not At Risk (07/03/2023)  Social Connections: Moderately Integrated (07/03/2023)  Tobacco Use: Medium Risk (07/03/2023)    Readmission Risk Interventions     No data to display

## 2023-07-08 NOTE — Discharge Summary (Addendum)
 PATIENT DETAILS Name: Danny Duke Age: 88 y.o. Sex: male Date of Birth: 02-18-1936 MRN: [id number]. Admitting Physician: Selene Dais, MD YQM:[id number], Herminia Lope, NP  Admit Date: 07/03/2023 Discharge date: 07/08/2023  Recommendations for Outpatient Follow-up:  Follow up with PCP in 1-2 weeks Please obtain CMP/CBC in one week Please ensure follow-up x-ray/CT chest in 4 to 6 weeks to document resolution of pneumonia.    Admitted From:  Home  Disposition: Home health   Discharge Condition: good  CODE STATUS:   Code Status: Full Code   Diet recommendation:  Diet Order             Diet - low sodium heart healthy           Diet Carb Modified           Diet regular Room service appropriate? Yes with Assist; Fluid consistency: Thin  Diet effective now                    Brief Summary: Patient is a 88 y.o.  male with history of non-small cell cancer of the lung-on observation, HTN, HLD, COPD, CKD stage IIIa-who presented to the ED on 4/27 (second visit) for fall/weakness/cough/subjective fever-found to have severe sepsis physiology with acute encephalopathy secondary to pneumonia   Significant events: 4/27>> admit to [facility]   Significant studies: 4/27>> MRI brain: No acute intracranial abnormality 4/27>> CT chest/abdomen: RLL pneumonia-no acute intra-abdominal/intrapelvic process.   Significant microbiology data: 4/27>> COVID/influenza/RSV PCR: Negative 4/27>> blood culture: No growth 4/30>> blood culture: No growth 4/30>> respiratory virus panel: Negative.   Procedures: None   Consults: None  Brief Hospital Course: Severe sepsis secondary to pneumonia Clinically improved-no fever for 48 hours All culture data negative On IV Unasyn  during this hospitalization-will be transition to Augmentin  for a few more days. PCP to repeat two-view chest x-ray/CT chest in 4 to 6 weeks.   Acute metabolic encephalopathy Secondary to pneumonia-this has  resolved-completely awake and alert Neuroimaging negative for CVA.   Hypokalemia Repleted.   COPD Not wheezing-stable As needed bronchodilators.   HTN Stable but creeping up Resume amlodipine  at half of home dose Continue metoprolol  Will follow/optimize.   HLD Continue statin   History of AAA-s/p endovascular stent graft placement Aspirin /statin Resume beta-blocker    History of non-small cell cancer of the lung s/p lobectomy/chemo/radiation in 2019 Currently on observation. Follows with Dr. Marguerita Shih   Peripheral neuropathy Per prior notes-secondary to chemotherapy Cymbalta /Neurontin    Debility/deconditioning Secondary to acute illness Second fall in within a week PT/OT eval-SNF recommended-patient has had more discussions with the wife-his son is coming in from [place] -they have decided to go home with home health.     Nutrition Status: Nutrition Problem: Moderate Malnutrition Etiology: chronic illness (lung cancer, CKDIII, HLD, HTN) Signs/Symptoms: mild muscle depletion, mild fat depletion, moderate muscle depletion, percent weight loss Percent weight loss: 9 % Interventions: Ensure Enlive (each supplement provides 350kcal and 20 grams of protein), MVI  Discharge Diagnoses:  Principal Problem:   Sepsis (HCC) Active Problems:   COPD GOLD I    Essential hypertension   Chemotherapy-induced neuropathy (HCC)   AAA (abdominal aortic aneurysm) (HCC)   Aspiration pneumonia (HCC)   Debility   Acute metabolic encephalopathy   CKD (chronic kidney disease) stage 3, GFR 30-59 ml/min (HCC)   Malnutrition of moderate degree   Discharge Instructions:  Activity:  As tolerated with Full fall precautions use walker/cane & assistance as needed   Discharge Instructions  Call MD for:  difficulty breathing, headache or visual disturbances   Complete by: As directed    Diet - low sodium heart healthy   Complete by: As directed    Diet Carb Modified   Complete by: As  directed    Discharge instructions   Complete by: As directed    Follow with Primary MD  Forest Idol, NP in 1-2 weeks  Please ask your primary care practitioner to repeat two-view chest x-ray or a CT chest in 4 to 6 weeks.  Please get a complete blood count and chemistry panel checked by your Primary MD at your next visit, and again as instructed by your Primary MD.  Get Medicines reviewed and adjusted: Please take all your medications with you for your next visit with your Primary MD  Laboratory/radiological data: Please request your Primary MD to go over all hospital tests and procedure/radiological results at the follow up, please ask your Primary MD to get all Hospital records sent to his/her office.  In some cases, they will be blood work, cultures and biopsy results pending at the time of your discharge. Please request that your primary care M.D. follows up on these results.  Also Note the following: If you experience worsening of your admission symptoms, develop shortness of breath, life threatening emergency, suicidal or homicidal thoughts you must seek medical attention immediately by calling 911 or calling your MD immediately  if symptoms less severe.  You must read complete instructions/literature along with all the possible adverse reactions/side effects for all the Medicines you take and that have been prescribed to you. Take any new Medicines after you have completely understood and accpet all the possible adverse reactions/side effects.   Do not drive when taking Pain medications or sleeping medications (Benzodaizepines)  Do not take more than prescribed Pain, Sleep and Anxiety Medications. It is not advisable to combine anxiety,sleep and pain medications without talking with your primary care practitioner  Special Instructions: If you have smoked or chewed Tobacco  in the last 2 yrs please stop smoking, stop any regular Alcohol  and or any Recreational drug use.  Wear  Seat belts while driving.  Please note: You were cared for by a hospitalist during your hospital stay. Once you are discharged, your primary care physician will handle any further medical issues. Please note that NO REFILLS for any discharge medications will be authorized once you are discharged, as it is imperative that you return to your primary care physician (or establish a relationship with a primary care physician if you do not have one) for your post hospital discharge needs so that they can reassess your need for medications and monitor your lab values.   Increase activity slowly   Complete by: As directed       Allergies as of 07/08/2023   No Known Allergies      Medication List     TAKE these medications    acetaminophen  500 MG tablet Commonly known as: TYLENOL  Take 1,000 mg by mouth every 6 (six) hours as needed for moderate pain or headache.   allopurinol  100 MG tablet Commonly known as: ZYLOPRIM  Take 100 mg by mouth daily.   amLODipine  10 MG tablet Commonly known as: NORVASC  Take 0.5 tablets (5 mg total) by mouth daily. What changed: how much to take   amoxicillin -clavulanate 875-125 MG tablet Commonly known as: AUGMENTIN  Take 1 tablet by mouth 2 (two) times daily for 2 days.   aspirin  EC 81 MG tablet Take  81 mg by mouth daily.   DULoxetine  60 MG capsule Commonly known as: CYMBALTA  TAKE 1 CAPSULE(60 MG) BY MOUTH DAILY What changed: See the new instructions.   Farxiga  10 MG Tabs tablet Generic drug: dapagliflozin  propanediol Take 10 mg by mouth daily.   gabapentin  400 MG capsule Commonly known as: NEURONTIN  Take 1 capsule (400 mg total) by mouth 3 (three) times daily. What changed:  medication strength See the new instructions.   metoprolol  succinate 25 MG 24 hr tablet Commonly known as: TOPROL -XL Take 1 tablet (25 mg total) by mouth daily.   omeprazole 40 MG capsule Commonly known as: PRILOSEC Take 40 mg by mouth daily before breakfast.    Oxycodone  HCl 20 MG Tabs Take 1 tablet by mouth every 6 (six) hours as needed.   simvastatin  20 MG tablet Commonly known as: ZOCOR  Take 20 mg by mouth daily.        Follow-up Information     [facility] Follow up.   Specialty: Home Health Services Why: Gasper Karst will contact you within 48 hours of duscharge to arrange a home health visit Contact information: [address] [place] [address] [phone]         Forest Idol, NP. Schedule an appointment as soon as possible for a visit in 1 week(s).   Specialty: Nurse Practitioner Contact information: [address] [place] [address] [phone]                No Known Allergies   Other Procedures/Studies: VAS US  LOWER EXTREMITY VENOUS (DVT) Result Date: 07/06/2023  Lower Venous DVT Study Patient Name:  Danny Duke  Date of Exam:   07/06/2023 Medical Rec #: [id number]        Accession #:    [id number] Date of Birth: 1935-06-03         Patient Gender: M Patient Age:   10 years Exam Location:  [facility] Procedure:      VAS US  LOWER EXTREMITY VENOUS (DVT) Referring Phys: Kimberly Penna --------------------------------------------------------------------------------  Indications: Swelling, and Recurrent non-small cell lung cancer.  Comparison Study: No priors. Performing Technologist: Franky Ivanoff Sturdivant-Jones RDMS, RVT  Examination Guidelines: A complete evaluation includes B-mode imaging, spectral Doppler, color Doppler, and power Doppler as needed of all accessible portions of each vessel. Bilateral testing is considered an integral part of a complete examination. Limited examinations for reoccurring indications may be performed as noted. The reflux portion of the exam is performed with the patient in reverse Trendelenburg.  +---------+---------------+---------+-----------+----------+--------------+ RIGHT    CompressibilityPhasicitySpontaneityPropertiesThrombus Aging  +---------+---------------+---------+-----------+----------+--------------+ CFV      Full           Yes      Yes                                 +---------+---------------+---------+-----------+----------+--------------+ SFJ      Full                                                        +---------+---------------+---------+-----------+----------+--------------+ FV Prox  Full                                                        +---------+---------------+---------+-----------+----------+--------------+  FV Mid   Full                                                        +---------+---------------+---------+-----------+----------+--------------+ FV DistalFull                                                        +---------+---------------+---------+-----------+----------+--------------+ PFV      Full                                                        +---------+---------------+---------+-----------+----------+--------------+ POP      Full           Yes      Yes                                 +---------+---------------+---------+-----------+----------+--------------+ PTV      Full                                                        +---------+---------------+---------+-----------+----------+--------------+ PERO     Full                                                        +---------+---------------+---------+-----------+----------+--------------+   +---------+---------------+---------+-----------+----------+--------------+ LEFT     CompressibilityPhasicitySpontaneityPropertiesThrombus Aging +---------+---------------+---------+-----------+----------+--------------+ CFV      Full           Yes      Yes                                 +---------+---------------+---------+-----------+----------+--------------+ SFJ      Full                                                         +---------+---------------+---------+-----------+----------+--------------+ FV Prox  Full                                                        +---------+---------------+---------+-----------+----------+--------------+ FV Mid   Full                                                        +---------+---------------+---------+-----------+----------+--------------+  FV DistalFull                                                        +---------+---------------+---------+-----------+----------+--------------+ PFV      Full                                                        +---------+---------------+---------+-----------+----------+--------------+ POP      Full           Yes      Yes                                 +---------+---------------+---------+-----------+----------+--------------+ PTV      Full                                                        +---------+---------------+---------+-----------+----------+--------------+ PERO     Full                                                        +---------+---------------+---------+-----------+----------+--------------+     Summary: BILATERAL: - No evidence of deep vein thrombosis seen in the lower extremities, bilaterally. -No evidence of popliteal cyst, bilaterally.   *See table(s) above for measurements and observations. Electronically signed by Genny Kid MD on 07/06/2023 at 8:31:10 PM.    Final    MR BRAIN WO CONTRAST Result Date: 07/03/2023 CLINICAL DATA:  Initial evaluation for acute neuro deficit, stroke suspected. EXAM: MRI HEAD WITHOUT CONTRAST TECHNIQUE: Multiplanar, multiecho pulse sequences of the brain and surrounding structures were obtained without intravenous contrast. COMPARISON:  CT from earlier the same day. FINDINGS: Brain: Examination moderately to severely degraded by motion artifact. Generalized age-related cerebral atrophy. Patchy and confluent T2/FLAIR hyperintensity involving the  periventricular deep white matter both cerebral hemispheres as well as the pons, most characteristic of chronic microvascular ischemic disease, advanced in nature. No evidence for acute or subacute infarct. Gray-white matter differentiation maintained. No areas of chronic cortical infarction. No visible acute or chronic intracranial blood products. No mass lesion, midline shift or mass effect. Mild diffuse ventricular prominence, most like related to underlying atrophy. No hydrocephalus. No extra-axial fluid collection. Vascular: Major intracranial vascular flow voids are grossly maintained at the skull base. Skull and upper cervical spine: Craniocervical junction grossly within normal limits. Bone marrow signal intensity grossly normal. No scalp soft tissue abnormality. Sinuses/Orbits: Globes orbital soft tissues within normal limits. Paranasal sinuses are largely clear. No significant mastoid effusion. Other: None. IMPRESSION: 1. Motion degraded exam. 2. No acute intracranial abnormality. 3. Age-related cerebral atrophy with advanced chronic microvascular ischemic disease. Electronically Signed   By: Virgia Griffins M.D.   On: 07/03/2023 23:58   CT Head Wo Contrast Result Date: 07/03/2023 CLINICAL DATA:  Mental status change of unknown  cause EXAM: CT HEAD WITHOUT CONTRAST TECHNIQUE: Contiguous axial images were obtained from the base of the skull through the vertex without intravenous contrast. RADIATION DOSE REDUCTION: This exam was performed according to the departmental dose-optimization program which includes automated exposure control, adjustment of the mA and/or kV according to patient size and/or use of iterative reconstruction technique. COMPARISON:  06/30/2023 CT.  MRI 10/15/2019. FINDINGS: Brain: No change or acute CT finding. No focal abnormality visible affecting the brainstem or cerebellum. Cerebral hemispheres show chronic small-vessel ischemic changes of the thalami and cerebral hemispheric  white matter. No large vessel territory stroke. No mass lesion, hemorrhage, hydrocephalus or extra-axial collection. Mild ventricular prominence is probably secondary to central atrophy. Vascular: There is atherosclerotic calcification of the major vessels at the base of the brain. Skull: Negative Sinuses/Orbits: Clear/normal Other: None IMPRESSION: No acute CT finding. Chronic small-vessel ischemic changes of the thalami and cerebral hemispheric white matter. Mild ventricular prominence probably secondary to central atrophy. Electronically Signed   By: Bettylou Brunner M.D.   On: 07/03/2023 17:42   CT CHEST ABDOMEN PELVIS WO CONTRAST Result Date: 07/03/2023 CLINICAL DATA:  Sepsis EXAM: CT CHEST, ABDOMEN AND PELVIS WITHOUT CONTRAST TECHNIQUE: Multidetector CT imaging of the chest, abdomen and pelvis was performed following the standard protocol without IV contrast. RADIATION DOSE REDUCTION: This exam was performed according to the departmental dose-optimization program which includes automated exposure control, adjustment of the mA and/or kV according to patient size and/or use of iterative reconstruction technique. COMPARISON:  07/03/2023, 03/13/2023 FINDINGS: CT CHEST FINDINGS Cardiovascular: Unenhanced imaging of the heart is unremarkable without pericardial effusion. Normal caliber of the thoracic aorta. Atherosclerosis of the aorta and coronary vasculature. Assessment of the vascular lumen cannot be performed without IV contrast. Mediastinum/Nodes: Stable calcified mediastinal and hilar lymph nodes. Thyroid, trachea, and esophagus are grossly unremarkable. Lungs/Pleura: Emphysema, with biapical pleural and parenchymal scarring again noted. Bullous changes are again seen within the right lower lobe, with superimposed airspace disease that has developed since prior study consistent with right lower lobe pneumonia or aspiration. Peripheral subpleural radiation fibrosis within the right lower lobe reference image  28/5. Trace right pleural effusion. No pneumothorax. Central airways are patent. Musculoskeletal: Stable right posterior seventh rib fracture, compatible with insufficiency fracture given adjacent post radiation change. No acute bony abnormalities. Reconstructed images demonstrate no additional findings. CT ABDOMEN PELVIS FINDINGS Hepatobiliary: Subcentimeter hepatic cysts. Otherwise unremarkable unenhanced appearance of the liver and gallbladder. Pancreas: Unremarkable unenhanced appearance. Spleen: Unremarkable unenhanced appearance. Adrenals/Urinary Tract: Stable bilateral renal cortical cysts, which do not require specific imaging follow-up. No urinary tract calculi or obstructive uropathy within either kidney. Bladder is minimally distended, limiting its evaluation. No gross bladder abnormalities. The adrenals are stable. Stomach/Bowel: No bowel obstruction or ileus. Normal appendix right lower quadrant. Scattered colonic diverticulosis without diverticulitis. No bowel wall thickening or inflammatory change. Vascular/Lymphatic: Endoluminal stent graft within the distal aorta and bilateral iliac arteries. 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 Assessment of the vascular lumen cannot be performed without IV contrast. Atherosclerosis of the aorta and its distal branches. No pathologic adenopathy. Reproductive: Stable enlargement of the prostate. Other: No free fluid or free intraperitoneal gas. No abdominal wall hernia. Musculoskeletal: No acute or destructive bony abnormalities. Reconstructed images demonstrate no additional findings. IMPRESSION: Chest: 1. New right lower lobe airspace disease superimposed upon chronic emphysema and bullous change, consistent with infection or aspiration. Trace right pleural effusion. 2. Stable emphysema with biapical pleural and parenchymal scarring. Stable post radiation fibrosis  within the subpleural region right lower lobe posteriorly. 3. Aortic  Atherosclerosis (ICD10-I70.0). Coronary artery atherosclerosis. 4. Stable calcified mediastinal and hilar lymph nodes. Abdomen/pelvis: 1. No acute intra-abdominal or intrapelvic process. 2. Endoluminal stent graft within the aorta and bilateral common iliac arteries, with stable left common iliac artery aneurysm. Assessment of the vascular lumen cannot be performed without IV contrast. 3.  Aortic Atherosclerosis (ICD10-I70.0). Electronically Signed   By: Bobbye Burrow M.D.   On: 07/03/2023 17:42   DG Chest Port 1 View Result Date: 07/03/2023 CLINICAL DATA:  Possible sepsis. EXAM: PORTABLE CHEST 1 VIEW COMPARISON:  06/30/2023 chest radiograph.  Chest CT of 03/13/2023 FINDINGS: Single AP view of the chest with patient moderately rotated to the right. Numerous leads and wires project over the chest. Tracheal deviation right secondary to obliquity and volume loss in the right hemithorax. Normal heart size. Trace right pleural fluid or thickening. No pneumothorax. Emphysema, with primarily right-sided pulmonary interstitial thickening. Extensive partially treatment related volume loss and architectural distortion throughout the right hemithorax. When compared to 03/13/2023, superimposed right mid and lower lung airspace disease, increased since 06/30/2023. Biapical pleuroparenchymal scarring, greater right than left. IMPRESSION: Progressive right mid and lower lung airspace disease/pneumonia, superimposed upon chronic architectural distortion and interstitial thickening. Emphysema (ICD10-J43.9). Electronically Signed   By: Lore Rode M.D.   On: 07/03/2023 14:52   CT Cervical Spine Wo Contrast Result Date: 06/30/2023 CLINICAL DATA:  Neck trauma (Age >= 65y), fall in bathtub, weakness, loss of balance EXAM: CT CERVICAL SPINE WITHOUT CONTRAST TECHNIQUE: Multidetector CT imaging of the cervical spine was performed without intravenous contrast. Multiplanar CT image reconstructions were also generated. RADIATION DOSE  REDUCTION: This exam was performed according to the departmental dose-optimization program which includes automated exposure control, adjustment of the mA and/or kV according to patient size and/or use of iterative reconstruction technique. COMPARISON:  None Available. FINDINGS: Alignment: Straightening of the normal cervical lordosis. No spondylolisthesis, uncovering of the facet joints, or significant widening of the spinous processes. No subluxation or abnormality identified at the craniovertebral junction. Skull base and vertebrae: Vertebral body heights are preserved. No acute fracture. No primary bone lesion or focal pathologic process.The lateral masses of C1 are well aligned with C2. The odontoid is intact. Soft tissues and spinal canal: No prevertebral edema or soft tissue thickening. No visible canal hematoma. Disc levels: Bony fusion across the C5-C6 disc space with severe intervertebral disc height loss at C6-C7. Moderate to severe height loss is also present at C7-T1. Disc bulge at C6-C7 and C7-T1 causing mild spinal canal narrowing. Multilevel uncovertebral hypertrophy with facet arthropathy throughout the cervical spine causing multilevel neural foraminal stenosis. This is most severe bilaterally at C4-C5 and on the left at C3-C4. Upper chest: Moderate cyst paraseptal and centrilobular emphysema with extensive nodular and masslike pleuroparenchymal scarring. Other: None IMPRESSION: 1. Straightening of the normal cervical lordosis, which may be due to muscular spasm or underlying degenerative changes. No acute fracture or malalignment of the cervical spine. 2. Advanced multilevel degenerative disc disease, as delineated above. Severe bilateral neuroforaminal stenosis at C4-C5. No high-grade spinal canal narrowing. Electronically Signed   By: Rance Burrows M.D.   On: 06/30/2023 16:25   CT Head Wo Contrast Result Date: 06/30/2023 CLINICAL DATA:  Head trauma, minor (Age >= 65y) EXAM: CT HEAD WITHOUT  CONTRAST TECHNIQUE: Contiguous axial images were obtained from the base of the skull through the vertex without intravenous contrast. RADIATION DOSE REDUCTION: This exam was performed according to the departmental  dose-optimization program which includes automated exposure control, adjustment of the mA and/or kV according to patient size and/or use of iterative reconstruction technique. COMPARISON:  October 04, 2022 FINDINGS: Brain: Proportional prominence of the ventricles and sulci, consistent with diffuse cerebral parenchymal volume loss. The ventricles otherwise maintained midline position without midline shift. Confluent periventricular and subcortical white matter hypoattenuation, most consistent with changes of severe chronic ischemic microvascular disease. No evidence of acute territorial infarction, extra-axial fluid collection, hemorrhage, or mass lesion. The basilar cisterns are patent without downward herniation. The cerebellar hemispheres and vermis are well formed without mass lesion or focal attenuation abnormality. Vascular: No hyperdense vessel. Calcified atherosclerotic plaque within the cavernous/supraclinoid internal carotid arteries. Skull: Normal. Negative for fracture or focal lesion. Sinuses/Orbits: The paranasal sinuses and mastoids are clear. The globes appear intact. No retrobulbar hematoma. Other: None. IMPRESSION: 1. No acute intracranial abnormality, specifically, no acute hemorrhage, territorial infarction, or intracranial mass. 2. Global cerebral volume loss with fairly severe changes of chronic ischemic microvascular disease. Electronically Signed   By: Rance Burrows M.D.   On: 06/30/2023 16:05   DG Chest Portable 1 View Result Date: 06/30/2023 CLINICAL DATA:  Status post fall with syncope EXAM: PORTABLE CHEST 1 VIEW COMPARISON:  March 13, 2023 FINDINGS: Ill-defined right lower lobe infiltrates and atelectasis with pleural thickening could correlate with chronic changes. No acute  infiltrates consolidations or pulmonary edema. No obvious fractures. Heart within normal limits in size. IMPRESSION: Chronic interstitial lung disease, no acute cardiopulmonary infiltrates Electronically Signed   By: Fredrich Jefferson M.D.   On: 06/30/2023 14:36   DG Shoulder Left Port Result Date: 06/30/2023 CLINICAL DATA:  Status post fall and pain EXAM: LEFT SHOULDER COMPARISON:  None Available. FINDINGS: There is no evidence of fracture or dislocation. There is no evidence of arthropathy or other focal bone abnormality. Soft tissues are unremarkable. Osteoarthrosis of the acromioclavicular joint IMPRESSION: Osteoarthrosis No fractures Electronically Signed   By: Fredrich Jefferson M.D.   On: 06/30/2023 13:37     TODAY-DAY OF DISCHARGE:  Subjective:   Danny Duke today has no headache,no chest abdominal pain,no new weakness tingling or numbness, feels much better wants to go home today.  Objective:   Blood pressure (!) 137/98, pulse 99, temperature 98.1 F (36.7 C), temperature source Oral, resp. rate 19, height 5\' 8"  (1.727 m), weight 70.4 kg, SpO2 91%.  Intake/Output Summary (Last 24 hours) at 07/08/2023 1057 Last data filed at 07/08/2023 0905 Gross per 24 hour  Intake 438 ml  Output 1275 ml  Net -837 ml   Filed Weights   07/03/23 2036  Weight: 70.4 kg    Exam: Awake Alert, Oriented *3, No new F.N deficits, Normal affect .AT,PERRAL Supple Neck,No JVD, No cervical lymphadenopathy appriciated.  Symmetrical Chest wall movement, Good air movement bilaterally, CTAB RRR,No Gallops,Rubs or new Murmurs, No Parasternal Heave +ve B.Sounds, Abd Soft, Non tender, No organomegaly appriciated, No rebound -guarding or rigidity. No Cyanosis, Clubbing or edema, No new Rash or bruise   PERTINENT RADIOLOGIC STUDIES: VAS US  LOWER EXTREMITY VENOUS (DVT) Result Date: 07/06/2023  Lower Venous DVT Study Patient Name:  Danny Duke  Date of Exam:   07/06/2023 Medical Rec #: [id number]        Accession  #:    [id number] Date of Birth: Jul 24, 1935         Patient Gender: M Patient Age:   89 years Exam Location:  [facility] Procedure:      VAS US  LOWER EXTREMITY  VENOUS (DVT) Referring Phys: Kimberly Penna --------------------------------------------------------------------------------  Indications: Swelling, and Recurrent non-small cell lung cancer.  Comparison Study: No priors. Performing Technologist: Franky Ivanoff Sturdivant-Jones RDMS, RVT  Examination Guidelines: A complete evaluation includes B-mode imaging, spectral Doppler, color Doppler, and power Doppler as needed of all accessible portions of each vessel. Bilateral testing is considered an integral part of a complete examination. Limited examinations for reoccurring indications may be performed as noted. The reflux portion of the exam is performed with the patient in reverse Trendelenburg.  +---------+---------------+---------+-----------+----------+--------------+ RIGHT    CompressibilityPhasicitySpontaneityPropertiesThrombus Aging +---------+---------------+---------+-----------+----------+--------------+ CFV      Full           Yes      Yes                                 +---------+---------------+---------+-----------+----------+--------------+ SFJ      Full                                                        +---------+---------------+---------+-----------+----------+--------------+ FV Prox  Full                                                        +---------+---------------+---------+-----------+----------+--------------+ FV Mid   Full                                                        +---------+---------------+---------+-----------+----------+--------------+ FV DistalFull                                                        +---------+---------------+---------+-----------+----------+--------------+ PFV      Full                                                         +---------+---------------+---------+-----------+----------+--------------+ POP      Full           Yes      Yes                                 +---------+---------------+---------+-----------+----------+--------------+ PTV      Full                                                        +---------+---------------+---------+-----------+----------+--------------+ PERO     Full                                                        +---------+---------------+---------+-----------+----------+--------------+   +---------+---------------+---------+-----------+----------+--------------+  LEFT     CompressibilityPhasicitySpontaneityPropertiesThrombus Aging +---------+---------------+---------+-----------+----------+--------------+ CFV      Full           Yes      Yes                                 +---------+---------------+---------+-----------+----------+--------------+ SFJ      Full                                                        +---------+---------------+---------+-----------+----------+--------------+ FV Prox  Full                                                        +---------+---------------+---------+-----------+----------+--------------+ FV Mid   Full                                                        +---------+---------------+---------+-----------+----------+--------------+ FV DistalFull                                                        +---------+---------------+---------+-----------+----------+--------------+ PFV      Full                                                        +---------+---------------+---------+-----------+----------+--------------+ POP      Full           Yes      Yes                                 +---------+---------------+---------+-----------+----------+--------------+ PTV      Full                                                         +---------+---------------+---------+-----------+----------+--------------+ PERO     Full                                                        +---------+---------------+---------+-----------+----------+--------------+     Summary: BILATERAL: - No evidence of deep vein thrombosis seen in the lower extremities, bilaterally. -No evidence of popliteal cyst, bilaterally.   *See table(s) above for measurements and observations. Electronically signed by Genny Kid MD on 07/06/2023 at 8:31:10 PM.  Final      PERTINENT LAB RESULTS: CBC: Recent Labs    07/06/23 0738 07/07/23 0508  WBC 9.3 8.0  HGB 11.0* 10.8*  HCT 32.2* 32.4*  PLT 167 175   CMET CMP     Component Value Date/Time   NA 137 07/07/2023 0508   K 3.9 07/07/2023 0508   CL 105 07/07/2023 0508   CO2 23 07/07/2023 0508   GLUCOSE 109 (H) 07/07/2023 0508   BUN 8 07/07/2023 0508   CREATININE 1.17 07/07/2023 0508   CREATININE 1.25 (H) 03/04/2023 0954   CALCIUM 8.5 (L) 07/07/2023 0508   PROT 6.2 (L) 07/07/2023 0508   ALBUMIN  2.3 (L) 07/07/2023 0508   AST 46 (H) 07/07/2023 0508   AST 19 03/04/2023 0954   ALT 25 07/07/2023 0508   ALT 9 03/04/2023 0954   ALKPHOS 59 07/07/2023 0508   BILITOT 0.9 07/07/2023 0508   BILITOT 0.4 03/04/2023 0954   GFRNONAA >60 07/07/2023 0508   GFRNONAA 56 (L) 03/04/2023 0954    GFR Estimated Creatinine Clearance: 44.7 mL/min (by C-G formula based on SCr of 1.17 mg/dL). No results for input(s): "LIPASE", "AMYLASE" in the last 72 hours. No results for input(s): "CKTOTAL", "CKMB", "CKMBINDEX", "TROPONINI" in the last 72 hours. Invalid input(s): "POCBNP" No results for input(s): "DDIMER" in the last 72 hours. No results for input(s): "HGBA1C" in the last 72 hours. No results for input(s): "CHOL", "HDL", "LDLCALC", "TRIG", "CHOLHDL", "LDLDIRECT" in the last 72 hours. No results for input(s): "TSH", "T4TOTAL", "T3FREE", "THYROIDAB" in the last 72 hours.  Invalid input(s): "FREET3" No  results for input(s): "VITAMINB12", "FOLATE", "FERRITIN", "TIBC", "IRON", "RETICCTPCT" in the last 72 hours. Coags: No results for input(s): "INR" in the last 72 hours.  Invalid input(s): "PT" Microbiology: Recent Results (from the past 240 hours)  Resp panel by RT-PCR (RSV, Flu A&B, Covid) Anterior Nasal Swab     Status: None   Collection Time: 06/30/23 12:39 PM   Specimen: Anterior Nasal Swab  Result Value Ref Range Status   SARS Coronavirus 2 by RT PCR NEGATIVE NEGATIVE Final    Comment: (NOTE) SARS-CoV-2 target nucleic acids are NOT DETECTED.  The SARS-CoV-2 RNA is generally detectable in upper respiratory specimens during the acute phase of infection. The lowest concentration of SARS-CoV-2 viral copies this assay can detect is 138 copies/mL. A negative result does not preclude SARS-Cov-2 infection and should not be used as the sole basis for treatment or other patient management decisions. A negative result may occur with  improper specimen collection/handling, submission of specimen other than nasopharyngeal swab, presence of viral mutation(s) within the areas targeted by this assay, and inadequate number of viral copies(<138 copies/mL). A negative result must be combined with clinical observations, patient history, and epidemiological information. The expected result is Negative.  Fact Sheet for Patients:  BloggerCourse.com  Fact Sheet for Healthcare Providers:  SeriousBroker.it  This test is no t yet approved or cleared by the [place]  FDA and  has been authorized for detection and/or diagnosis of SARS-CoV-2 by FDA under an Emergency Use Authorization (EUA). This EUA will remain  in effect (meaning this test can be used) for the duration of the COVID-19 declaration under Section 564(b)(1) of the Act, 21 U.S.C.section 360bbb-3(b)(1), unless the authorization is terminated  or revoked sooner.       Influenza A by  PCR NEGATIVE NEGATIVE Final   Influenza B by PCR NEGATIVE NEGATIVE Final    Comment: (NOTE) The Xpert Xpress SARS-CoV-2/FLU/RSV plus assay is  intended as an aid in the diagnosis of influenza from Nasopharyngeal swab specimens and should not be used as a sole basis for treatment. Nasal washings and aspirates are unacceptable for Xpert Xpress SARS-CoV-2/FLU/RSV testing.  Fact Sheet for Patients: BloggerCourse.com  Fact Sheet for Healthcare Providers: SeriousBroker.it  This test is not yet approved or cleared by the [place]  FDA and has been authorized for detection and/or diagnosis of SARS-CoV-2 by FDA under an Emergency Use Authorization (EUA). This EUA will remain in effect (meaning this test can be used) for the duration of the COVID-19 declaration under Section 564(b)(1) of the Act, 21 U.S.C. section 360bbb-3(b)(1), unless the authorization is terminated or revoked.     Resp Syncytial Virus by PCR NEGATIVE NEGATIVE Final    Comment: (NOTE) Fact Sheet for Patients: BloggerCourse.com  Fact Sheet for Healthcare Providers: SeriousBroker.it  This test is not yet approved or cleared by the [place]  FDA and has been authorized for detection and/or diagnosis of SARS-CoV-2 by FDA under an Emergency Use Authorization (EUA). This EUA will remain in effect (meaning this test can be used) for the duration of the COVID-19 declaration under Section 564(b)(1) of the Act, 21 U.S.C. section 360bbb-3(b)(1), unless the authorization is terminated or revoked.  Performed at [facility], [address], [place], [place] [address]   Resp panel by RT-PCR (RSV, Flu A&B, Covid) Anterior Nasal Swab     Status: None   Collection Time: 07/03/23  1:39 PM   Specimen: Anterior Nasal Swab  Result Value Ref Range Status   SARS Coronavirus 2 by RT PCR NEGATIVE NEGATIVE Final    Influenza A by PCR NEGATIVE NEGATIVE Final   Influenza B by PCR NEGATIVE NEGATIVE Final    Comment: (NOTE) The Xpert Xpress SARS-CoV-2/FLU/RSV plus assay is intended as an aid in the diagnosis of influenza from Nasopharyngeal swab specimens and should not be used as a sole basis for treatment. Nasal washings and aspirates are unacceptable for Xpert Xpress SARS-CoV-2/FLU/RSV testing.  Fact Sheet for Patients: BloggerCourse.com  Fact Sheet for Healthcare Providers: SeriousBroker.it  This test is not yet approved or cleared by the [place]  FDA and has been authorized for detection and/or diagnosis of SARS-CoV-2 by FDA under an Emergency Use Authorization (EUA). This EUA will remain in effect (meaning this test can be used) for the duration of the COVID-19 declaration under Section 564(b)(1) of the Act, 21 U.S.C. section 360bbb-3(b)(1), unless the authorization is terminated or revoked.     Resp Syncytial Virus by PCR NEGATIVE NEGATIVE Final    Comment: (NOTE) Fact Sheet for Patients: BloggerCourse.com  Fact Sheet for Healthcare Providers: SeriousBroker.it  This test is not yet approved or cleared by the [place]  FDA and has been authorized for detection and/or diagnosis of SARS-CoV-2 by FDA under an Emergency Use Authorization (EUA). This EUA will remain in effect (meaning this test can be used) for the duration of the COVID-19 declaration under Section 564(b)(1) of the Act, 21 U.S.C. section 360bbb-3(b)(1), unless the authorization is terminated or revoked.  Performed at [facility], [address] [place], [place] [address]   Blood Culture (routine x 2)     Status: None   Collection Time: 07/03/23  1:39 PM   Specimen: BLOOD  Result Value Ref Range Status   Specimen Description BLOOD RIGHT ANTECUBITAL  Final   Special Requests   Final    BOTTLES DRAWN  AEROBIC AND ANAEROBIC Blood Culture results may not be optimal due to  an inadequate volume of blood received in culture bottles   Culture   Final    NO GROWTH 5 DAYS Performed at [facility], [address] [place], [place] [address]    Report Status 07/08/2023 FINAL  Final  Blood Culture (routine x 2)     Status: None   Collection Time: 07/03/23  4:03 PM   Specimen: BLOOD LEFT HAND  Result Value Ref Range Status   Specimen Description BLOOD LEFT HAND  Final   Special Requests   Final    AEROBIC BOTTLE ONLY Blood Culture results may not be optimal due to an inadequate volume of blood received in culture bottles   Culture   Final    NO GROWTH 5 DAYS Performed at [facility], [address] [place], [place] [address]    Report Status 07/08/2023 FINAL  Final  Culture, blood (Routine X 2) w Reflex to ID Panel     Status: None (Preliminary result)   Collection Time: 07/06/23 11:45 AM   Specimen: BLOOD RIGHT ARM  Result Value Ref Range Status   Specimen Description BLOOD RIGHT ARM  Final   Special Requests   Final    BOTTLES DRAWN AEROBIC AND ANAEROBIC Blood Culture adequate volume   Culture   Final    NO GROWTH 2 DAYS Performed at [facility], [address] [place], [place] [address]    Report Status PENDING  Incomplete  Culture, blood (Routine X 2) w Reflex to ID Panel     Status: None (Preliminary result)   Collection Time: 07/06/23 11:51 AM   Specimen: BLOOD RIGHT ARM  Result Value Ref Range Status   Specimen Description BLOOD RIGHT ARM  Final   Special Requests   Final    BOTTLES DRAWN AEROBIC AND ANAEROBIC Blood Culture adequate volume   Culture   Final    NO GROWTH 2 DAYS Performed at [facility], [address], [place], [place] [address]    Report Status PENDING  Incomplete  MRSA Next Gen by PCR, Nasal     Status: None   Collection Time: 07/06/23  1:34 PM   Specimen: Nasal Mucosa; Nasal Swab  Result Value Ref Range Status   MRSA by PCR  Next Gen NOT DETECTED NOT DETECTED Final    Comment: (NOTE) The GeneXpert MRSA Assay (FDA approved for NASAL specimens only), is one component of a comprehensive MRSA colonization surveillance program. It is not intended to diagnose MRSA infection nor to guide or monitor treatment for MRSA infections. Test performance is not FDA approved in patients less than 64 years old. Performed at [facility], [address], [place], [place] [address]   Respiratory (~20 pathogens) panel by PCR     Status: None   Collection Time: 07/06/23  1:35 PM   Specimen: Nasopharyngeal Swab; Respiratory  Result Value Ref Range Status   Adenovirus NOT DETECTED NOT DETECTED Final   Coronavirus 229E NOT DETECTED NOT DETECTED Final    Comment: (NOTE) The Coronavirus on the Respiratory Panel, DOES NOT test for the novel  Coronavirus (2019 nCoV)    Coronavirus HKU1 NOT DETECTED NOT DETECTED Final   Coronavirus NL63 NOT DETECTED NOT DETECTED Final   Coronavirus OC43 NOT DETECTED NOT DETECTED Final   Metapneumovirus NOT DETECTED NOT DETECTED Final   Rhinovirus / Enterovirus NOT DETECTED NOT DETECTED Final   Influenza A NOT DETECTED NOT DETECTED Final   Influenza B NOT DETECTED NOT  DETECTED Final   Parainfluenza Virus 1 NOT DETECTED NOT DETECTED Final   Parainfluenza Virus 2 NOT DETECTED NOT DETECTED Final   Parainfluenza Virus 3 NOT DETECTED NOT DETECTED Final   Parainfluenza Virus 4 NOT DETECTED NOT DETECTED Final   Respiratory Syncytial Virus NOT DETECTED NOT DETECTED Final   Bordetella pertussis NOT DETECTED NOT DETECTED Final   Bordetella Parapertussis NOT DETECTED NOT DETECTED Final   Chlamydophila pneumoniae NOT DETECTED NOT DETECTED Final   Mycoplasma pneumoniae NOT DETECTED NOT DETECTED Final    Comment: Performed at [facility], [address] [place], [place] [address]    FURTHER DISCHARGE INSTRUCTIONS:  Get Medicines reviewed and adjusted: Please take all your medications with you  for your next visit with your Primary MD  Laboratory/radiological data: Please request your Primary MD to go over all hospital tests and procedure/radiological results at the follow up, please ask your Primary MD to get all Hospital records sent to his/her office.  In some cases, they will be blood work, cultures and biopsy results pending at the time of your discharge. Please request that your primary care M.D. goes through all the records of your hospital data and follows up on these results.  Also Note the following: If you experience worsening of your admission symptoms, develop shortness of breath, life threatening emergency, suicidal or homicidal thoughts you must seek medical attention immediately by calling 911 or calling your MD immediately  if symptoms less severe.  You must read complete instructions/literature along with all the possible adverse reactions/side effects for all the Medicines you take and that have been prescribed to you. Take any new Medicines after you have completely understood and accpet all the possible adverse reactions/side effects.   Do not drive when taking Pain medications or sleeping medications (Benzodaizepines)  Do not take more than prescribed Pain, Sleep and Anxiety Medications. It is not advisable to combine anxiety,sleep and pain medications without talking with your primary care practitioner  Special Instructions: If you have smoked or chewed Tobacco  in the last 2 yrs please stop smoking, stop any regular Alcohol  and or any Recreational drug use.  Wear Seat belts while driving.  Please note: You were cared for by a hospitalist during your hospital stay. Once you are discharged, your primary care physician will handle any further medical issues. Please note that NO REFILLS for any discharge medications will be authorized once you are discharged, as it is imperative that you return to your primary care physician (or establish a relationship with a  primary care physician if you do not have one) for your post hospital discharge needs so that they can reassess your need for medications and monitor your lab values.  Total Time spent coordinating discharge including counseling, education and face to face time equals greater than 30 minutes.  SignedKimberly Penna 07/08/2023 10:57 AM

## 2023-07-08 NOTE — Care Management Important Message (Signed)
 Important Message  Patient Details  Name: Danny Duke MRN: [id number] Date of Birth: 1935-09-13   Important Message Given:  Yes - Medicare IM     Felix Host 07/08/2023, 12:15 PM

## 2023-07-08 NOTE — Progress Notes (Signed)
 Reviewed AVS, patient expressed understanding of medications, MD follow up reviewed.   Removed IV, Site clean, dry and intact.  Patient states all belongings brought to the hospital at time of admission are accounted for and packed to take home.  Patient informed and expressed understanding of where to pick up medications . Pt transported to Discharge lounge to wait for transportation home.  Patients wife expressed need for walker and BSC after telling case worker these items would not be needed. Informed Ashton Blakes Case Manager of need; Hazen Liter stated she would have the DME mentioned above delivered to the patients home.

## 2023-07-11 LAB — CULTURE, BLOOD (ROUTINE X 2)
Culture: NO GROWTH
Culture: NO GROWTH
Special Requests: ADEQUATE
Special Requests: ADEQUATE

## 2023-08-17 ENCOUNTER — Encounter: Payer: Self-pay | Admitting: Medical Oncology

## 2023-09-02 ENCOUNTER — Encounter: Payer: Self-pay | Admitting: Internal Medicine

## 2023-09-05 ENCOUNTER — Ambulatory Visit (HOSPITAL_COMMUNITY)
Admission: RE | Admit: 2023-09-05 | Discharge: 2023-09-05 | Disposition: A | Discharge status: Home / Self Care | Source: Ambulatory Visit | Attending: Internal Medicine | Admitting: Internal Medicine

## 2023-09-05 ENCOUNTER — Inpatient Hospital Stay: Payer: Medicare (Managed Care) | Attending: Internal Medicine

## 2023-09-05 DIAGNOSIS — C3490 Malignant neoplasm of unspecified part of unspecified bronchus or lung: Secondary | ICD-10-CM

## 2023-09-05 DIAGNOSIS — C3431 Malignant neoplasm of lower lobe, right bronchus or lung: Secondary | ICD-10-CM | POA: Insufficient documentation

## 2023-09-05 LAB — CMP (CANCER CENTER ONLY)
ALT: 6 U/L (ref 0–44)
AST: 16 U/L (ref 15–41)
Albumin: 3.5 g/dL (ref 3.5–5.0)
Alkaline Phosphatase: 76 U/L (ref 38–126)
Anion gap: 5 (ref 5–15)
BUN: 13 mg/dL (ref 8–23)
CO2: 28 mmol/L (ref 22–32)
Calcium: 9.9 mg/dL (ref 8.9–10.3)
Chloride: 105 mmol/L (ref 98–111)
Creatinine: 1.17 mg/dL (ref 0.61–1.24)
GFR, Estimated: >60 mL/min (ref >60)
Glucose, Bld: 94 mg/dL (ref 70–99)
Potassium: 4.3 mmol/L (ref 3.5–5.1)
Sodium: 138 mmol/L (ref 135–145)
Total Bilirubin: 0.4 mg/dL (ref 0.0–1.2)
Total Protein: 8.3 g/dL — ABNORMAL HIGH (ref 6.5–8.1)

## 2023-09-05 LAB — CBC WITH DIFFERENTIAL (CANCER CENTER ONLY)
Abs Immature Granulocytes: 0.02 10*3/uL (ref 0.00–0.07)
Basophils Absolute: 0 10*3/uL (ref 0.0–0.1)
Basophils Relative: 0 %
Eosinophils Absolute: 0.6 10*3/uL — ABNORMAL HIGH (ref 0.0–0.5)
Eosinophils Relative: 8 %
HCT: 40 % (ref 39.0–52.0)
Hemoglobin: 13.2 g/dL (ref 13.0–17.0)
Immature Granulocytes: 0 %
Lymphocytes Relative: 28 %
Lymphs Abs: 1.8 10*3/uL (ref 0.7–4.0)
MCH: 30.6 pg (ref 26.0–34.0)
MCHC: 33 g/dL (ref 30.0–36.0)
MCV: 92.8 fL (ref 80.0–100.0)
Monocytes Absolute: 0.8 10*3/uL (ref 0.1–1.0)
Monocytes Relative: 13 %
Neutro Abs: 3.3 10*3/uL (ref 1.7–7.7)
Neutrophils Relative %: 51 %
Platelet Count: 184 10*3/uL (ref 150–400)
RBC: 4.31 MIL/uL (ref 4.22–5.81)
RDW: 14.6 % (ref 11.5–15.5)
WBC Count: 6.5 10*3/uL (ref 4.0–10.5)
nRBC: 0 % (ref 0.0–0.2)

## 2023-09-12 ENCOUNTER — Inpatient Hospital Stay: Payer: Medicare (Managed Care) | Admitting: Internal Medicine

## 2023-09-12 ENCOUNTER — Inpatient Hospital Stay: Attending: Hematology | Admitting: Hematology

## 2023-09-12 VITALS — BP 120/79 | HR 107 | Temp 97.8°F | Resp 17 | Ht 68.0 in | Wt 145.0 lb

## 2023-09-12 DIAGNOSIS — Z9221 Personal history of antineoplastic chemotherapy: Secondary | ICD-10-CM | POA: Insufficient documentation

## 2023-09-12 DIAGNOSIS — C3491 Malignant neoplasm of unspecified part of right bronchus or lung: Secondary | ICD-10-CM | POA: Diagnosis not present

## 2023-09-12 DIAGNOSIS — Z923 Personal history of irradiation: Secondary | ICD-10-CM | POA: Insufficient documentation

## 2023-09-12 DIAGNOSIS — J439 Emphysema, unspecified: Secondary | ICD-10-CM | POA: Diagnosis not present

## 2023-09-12 DIAGNOSIS — Z79899 Other long term (current) drug therapy: Secondary | ICD-10-CM | POA: Diagnosis not present

## 2023-09-12 DIAGNOSIS — G629 Polyneuropathy, unspecified: Secondary | ICD-10-CM | POA: Insufficient documentation

## 2023-09-12 DIAGNOSIS — M549 Dorsalgia, unspecified: Secondary | ICD-10-CM | POA: Insufficient documentation

## 2023-09-12 DIAGNOSIS — Z87891 Personal history of nicotine dependence: Secondary | ICD-10-CM | POA: Diagnosis not present

## 2023-09-12 DIAGNOSIS — E7800 Pure hypercholesterolemia, unspecified: Secondary | ICD-10-CM | POA: Insufficient documentation

## 2023-09-12 DIAGNOSIS — Z8701 Personal history of pneumonia (recurrent): Secondary | ICD-10-CM | POA: Insufficient documentation

## 2023-09-12 DIAGNOSIS — R590 Localized enlarged lymph nodes: Secondary | ICD-10-CM | POA: Insufficient documentation

## 2023-09-12 DIAGNOSIS — C3431 Malignant neoplasm of lower lobe, right bronchus or lung: Secondary | ICD-10-CM | POA: Insufficient documentation

## 2023-09-12 DIAGNOSIS — Z7984 Long term (current) use of oral hypoglycemic drugs: Secondary | ICD-10-CM | POA: Diagnosis not present

## 2023-09-12 DIAGNOSIS — I129 Hypertensive chronic kidney disease with stage 1 through stage 4 chronic kidney disease, or unspecified chronic kidney disease: Secondary | ICD-10-CM | POA: Insufficient documentation

## 2023-09-12 DIAGNOSIS — N189 Chronic kidney disease, unspecified: Secondary | ICD-10-CM | POA: Insufficient documentation

## 2023-09-12 DIAGNOSIS — Z7982 Long term (current) use of aspirin: Secondary | ICD-10-CM | POA: Diagnosis not present

## 2023-09-12 MED ORDER — AMOXICILLIN-POT CLAVULANATE 875-125 MG PO TABS: 1.0000 | ORAL_TABLET | Freq: Two times a day (BID) | ORAL | 0 refills | Status: AC

## 2023-09-12 NOTE — Progress Notes (Signed)
 [facility]   [phone] [phone]   Clinic Follow up Note   Patient Care Team: [name], NP as PCP - General (Nurse Practitioner) Sherrod Sherrod, MD as Consulting Physician (Oncology) Kerrin Elspeth BROCKS, MD as Consulting Physician (Cardiothoracic Surgery)  Date of Service:  09/12/2023  CHIEF COMPLAINT: f/u of lung cancer  DIAGNOSIS: Recurrent non-small cell lung cancer, squamous cell carcinoma presented with right lower lobe lung nodule in August 2021 initially diagnosed as stage IIB (T3, N0, M0) non-small cell lung cancer, invasive well-differentiated squamous cell carcinoma presented with right middle lobe lung mass in May 2019 .   PRIOR THERAPY:  1) Status post right VATS, right middle lobectomy with mediastinal lymph node sampling under the care of Dr. Kerrin on 09/19/2017.  The tumor measured 4.2 cm but the carcinoma extends through the visceral pleura. 2) Adjuvant systemic chemotherapy with carboplatin  for AUC of 6 and paclitaxel  200 mg/M2 every 3 weeks.  First dose 11/16/2017.  Status post 3 cycles.  Last dose was giving January 05, 2018 discontinued secondary to intolerance with significant peripheral neuropathy. 3) curative radiotherapy to the right lower lobe pulmonary nodule under the care of Dr. Patrcia completed on December 21, 2019.   CURRENT THERAPY: Observation.    Assessment & Plan Recurrent lung cancer Recurrent lung cancer in the right lung, initially diagnosed in 2019 and treated with surgery and chemotherapy. Recurrence in 2021 treated with radiation. Current CT scan shows no definitive evidence of recurrence, but there is chronic inflammation in the right lower lobe, likely due to previous radiation and pneumonia. Considering the history of recurrence and persistent abnormal CT findings, there is a low suspicion of cancer recurrence. - Order blood tests for circulating tumor DNA next week to monitor for cancer  recurrence. - Schedule follow-up appointment with Dr. Deatrice in three weeks to review blood test results and determine further management.  Chronic cough with sputum production Chronic cough with dark greenish sputum production persisting since pneumonia in April. Recent worsening of cough with sputum production for the past two weeks. Low-grade fever noted recently. Lungs auscultated with crackling on the right side, indicating inflammation. - Prescribe Augmentin  (amoxicillin /clavulanate) to be taken twice daily for seven days. - Advise to start antibiotics today and take after meals.  Pneumonia Pneumonia in April required hospitalization for five days. Persistent cough since then, with recent exacerbation. CT scan shows improvement in pneumonia-related changes in the right lower lobe, but not completely resolved. - Monitor response to antibiotics and reassess if symptoms persist or worsen.  Plan - I reviewed his CT scan from September 05, 2023 with the patient and his wife - I recommended CT DNA GuardianReveal next week - I called in antibiotics Augmentin  for him  - Follow-up with Dr. Sherrod in 3 weeks   SUMMARY OF ONCOLOGIC HISTORY: Oncology History Overview Note  Patient presented with an incidental finding during annual work up with CXR   Squamous cell carcinoma lung, right (HCC)  06/16/2017 Imaging   CT Chest IMPRESSION: 1. Right middle lobe mass with spiculated margins. Mass measures 3 cm in greatest dimension, but is also contiguous with additional opacity extending to the anterior inferior aspect of the right middle lobe. Neoplasm is suspected. Tissue sampling is warranted. 2. Irregular nodular opacities in the right upper lobe extending to the apex. This may all be scarring. Cannot exclude any of the small nodular components as neoplastic disease. 3. Bilateral apical pleuroparenchymal scarring 4. Small noncalcified left lobe nodule measuring  4 mm. 5. Advanced emphysema. 6.  Mild mediastinal and right hilar adenopathy. Multiple lymph nodes demonstrate calcifications consistent with changes from old, healed granulomatous disease.   07/26/2017 Procedure   CT Biopsy   08/05/2017 Initial Diagnosis   Squamous cell carcinoma lung, right (HCC)   09/01/2017 Imaging   MRI Brain No visible intracranial metastatic disease   09/19/2017 Pathology Results   REASON FOR ADDENDUM, AMENDMENT OR CORRECTION: [id number]: TNM stage corrected to read pT3, pN0 previously reported as pT2b, pN0. 11/10/17 08:26:35 AM (gt) FINAL DIAGNOSIS Diagnosis 1. Lymph node, biopsy, 4R - THERE IS NO EVIDENCE OF CARCINOMA IN 1 OF 1 LYMPH NODE (0/1). 2. Lymph node, biopsy, 7 - THERE IS NO EVIDENCE OF CARCINOMA IN 1 OF 1 LYMPH NODE (0/1). 3. Lymph node, biopsy, 9 - THERE IS NO EVIDENCE OF CARCINOMA IN 1 OF 1 LYMPH NODE (0/1). 4. Pleura, biopsy, right parietal - INFLAMED FIBROUS TISSUE. - THERE IS NO EVIDENCE OF MALIGNANCY. 5. Lymph node, biopsy, 7 #4 - THERE IS NO EVIDENCE OF CARCINOMA IN 1 OF 1 LYMPH NODE (0/1). 6. Pleura, biopsy, right upper lobe nodule - INFLAMED FIBROUS TISSUE AND LUNG PARENCHYMA. - THERE IS NO EVIDENCE OF MALIGNANCY. 7. Lymph node, biopsy, 4R #2 - THERE IS NO EVIDENCE OF CARCINOMA IN 1 OF 1 LYMPH NODE (0/1). 8. Lymph node, biopsy, 7 #2 - THERE IS NO EVIDENCE OF CARCINOMA IN 1 OF 1 LYMPH NODE (0/1). 9. Lymph node, biopsy, 2R - THERE IS NO EVIDENCE OF CARCINOMA IN 1 OF 1 LYMPH NODE (0/1). 10. Lung, wedge biopsy/resection, Final margin - BENIGN LUNG PARENCHYMA WITH SUBPLEURAL DENSE FIBROSIS AND DYSTROPHIC CALCIFICATIONS. - THERE IS NO EVIDENCE OF CARCINOMA IN 8 OF 8 LYMPH NODES (0/8). - THERE IS NO EVIDENCE OF MALIGNANCY. 11. Lung, resection (segmental or lobe), Right middle lobe - INVASIVE SQUAMOUS CELL CARCINOMA, WELL-DIFFERENTIATED, SPANNING 4.2 CM.   10/20/2017 Surgery   Mediastinoscopy, right middle lobectomy, VATS   11/04/2017 Imaging    PET IMPRESSION: 1. The right middle lobe mass and adjacent right middle lobe nodule are both highly hypermetabolic on today's examination. In addition, there is biapical scarring which is mildly hypermetabolic, and mildly hypermetabolic scattered lymph nodes in the mediastinum and hila which are partially calcified. Although malignancy is clearly a concern in the right middle lobe, part or all of the appearance could also be caused by active granulomatous disease. The possibility of active granulomatous disease is somewhat emphasized in this case given that the biapical scarring and calcified lymph nodes would be more characteristic of a prior granulomatous process. That said, the right middle lobe lesions likely warrant sampling as these lesions are substantially more metabolic than the rest of the findings. 2. Small right pleural effusion with trace pleural thickening and low-grade activity.   11/24/2017 - 01/07/2018 Chemotherapy   The patient had palonosetron  (ALOXI ) injection 0.25 mg, 0.25 mg, Intravenous,  Once, 3 of 4 cycles Administration: 0.25 mg (11/24/2017), 0.25 mg (12/14/2017), 0.25 mg (01/05/2018) pegfilgrastim -cbqv (UDENYCA ) injection 6 mg, 6 mg, Subcutaneous, Once, 3 of 4 cycles Administration: 6 mg (11/26/2017), 6 mg (12/16/2017), 6 mg (01/07/2018) CARBOplatin  (PARAPLATIN ) 400 mg in sodium chloride  0.9 % 250 mL chemo infusion, 400 mg (100 % of original dose 395.4 mg), Intravenous,  Once, 3 of 4 cycles Dose modification: 395.4 mg (original dose 395.4 mg, Cycle 1), 395.4 mg (original dose 395.4 mg, Cycle 2), 332 mg (original dose 332 mg, Cycle 3) Administration: 400 mg (11/24/2017), 400 mg (12/14/2017), 330 mg (01/05/2018) PACLitaxel  (  TAXOL ) 366 mg in sodium chloride  0.9 % 500 mL chemo infusion (> 80mg /m2), 200 mg/m2 = 366 mg, Intravenous,  Once, 3 of 4 cycles Dose modification: 175 mg/m2 (original dose 200 mg/m2, Cycle 3, Reason: Provider Judgment) Administration: 366 mg  (11/24/2017), 366 mg (12/14/2017), 324 mg (01/05/2018) fosaprepitant  (EMEND ) 150 mg, dexamethasone  (DECADRON ) 12 mg in sodium chloride  0.9 % 145 mL IVPB, , Intravenous,  Once, 3 of 4 cycles Administration:  (11/24/2017),  (12/14/2017),  (01/05/2018)  for chemotherapy treatment.    08/29/2020 Cancer Staging   Staging form: Lung, AJCC 8th Edition - Clinical: Stage IIB (cT3, cN0, cM0) - Signed by Sherrod Sherrod, MD on 08/29/2020      Discussed the use of AI scribe software for clinical note transcription with the patient, who gave verbal consent to proceed.  History of Present Illness The patient is an 88 year old with recurrent lung cancer who presents with a persistent cough. He is accompanied by his wife, Mrs. Lupinski. He was referred by Dr. Deatrice for evaluation of his recurrent lung cancer.  In April 2025, he was hospitalized for five days due to pneumonia. Since then, he has experienced a persistent cough that has worsened over the past few weeks. The cough is described as 'terrible' and occurs throughout the night, causing him to wake up. It is associated with the production of dark greenish sputum, but no hemoptysis. The cough never completely resolved after the pneumonia.  He quit smoking 30 to 40 years ago and has no known allergies to antibiotics. No fever or chills recently, except for the low-grade fever noted by his primary doctor. No chest discomfort or chronic cough prior to the pneumonia episode.     All other systems were reviewed with the patient and are negative.  MEDICAL HISTORY:  Past Medical History:  Diagnosis Date   AAA (abdominal aortic aneurysm) (HCC)    Chronic back pain    Chronic kidney disease    Hypercholesterolemia    Hypertension    Right Lung dx'd 09/2017   s/p RMLobectomy 2019 and adjuvant chemotherapy and SBRT    SURGICAL HISTORY: Past Surgical History:  Procedure Laterality Date   ABDOMINAL AORTIC ENDOVASCULAR STENT GRAFT Right 02/03/2022    Procedure: ABDOMINAL AORTIC ENDOVASCULAR STENT GRAFT;  Surgeon: Serene Gaile ORN, MD;  Location: [facility] OR;  Service: Vascular;  Laterality: Right;   BRONCHIAL NEEDLE ASPIRATION BIOPSY  08/05/2017   Procedure: BRONCHIAL NEEDLE ASPIRATION BIOPSIES;  Surgeon: Alaine Vicenta NOVAK, MD;  Location: [facility];  Service: Cardiopulmonary;;   ENDOBRONCHIAL ULTRASOUND Bilateral 08/05/2017   Procedure: ENDOBRONCHIAL ULTRASOUND;  Surgeon: Alaine Vicenta NOVAK, MD;  Location: [facility];  Service: Cardiopulmonary;  Laterality: Bilateral;   IR IMAGING GUIDED PORT INSERTION  12/27/2017   IR REMOVAL TUN ACCESS W/ PORT W/O FL MOD SED  03/25/2020   MEDIASTINOSCOPY N/A 09/19/2017   Procedure: MEDIASTINOSCOPY;  Surgeon: Kerrin Elspeth BROCKS, MD;  Location: [facility] OR;  Service: Thoracic;  Laterality: N/A;   MULTIPLE TOOTH EXTRACTIONS     VIDEO ASSISTED THORACOSCOPY (VATS)/ LOBECTOMY Right 09/19/2017   Procedure: RIGHT VIDEO ASSISTED THORACOSCOPY (VATS)/ LOBECTOMY;  Surgeon: Kerrin Elspeth BROCKS, MD;  Location: [facility];  Service: Thoracic;  Laterality: Right;    I have reviewed the social history and family history with the patient and they are unchanged from previous note.  ALLERGIES:  has no known allergies.  MEDICATIONS:  Current Outpatient Medications  Medication Sig Dispense Refill   amoxicillin -clavulanate (AUGMENTIN ) 875-125 MG tablet Take 1 tablet by  mouth 2 (two) times daily. 14 tablet 0   acetaminophen  (TYLENOL ) 500 MG tablet Take 1,000 mg by mouth every 6 (six) hours as needed for moderate pain or headache.     allopurinol  (ZYLOPRIM ) 100 MG tablet Take 100 mg by mouth daily.     amLODipine  (NORVASC ) 10 MG tablet Take 0.5 tablets (5 mg total) by mouth daily. 30 tablet 0   aspirin  EC 81 MG tablet Take 81 mg by mouth daily.     DULoxetine  (CYMBALTA ) 60 MG capsule TAKE 1 CAPSULE(60 MG) BY MOUTH DAILY 30 capsule 3   FARXIGA  10 MG TABS tablet Take 10 mg by mouth daily.     gabapentin  (NEURONTIN ) 400 MG capsule Take 1  capsule (400 mg total) by mouth 3 (three) times daily. 90 capsule 0   metoprolol  succinate (TOPROL -XL) 25 MG 24 hr tablet Take 1 tablet (25 mg total) by mouth daily. 60 tablet 1   omeprazole (PRILOSEC) 40 MG capsule Take 40 mg by mouth daily before breakfast.     Oxycodone  HCl 20 MG TABS Take 1 tablet by mouth every 6 (six) hours as needed.     simvastatin  (ZOCOR ) 20 MG tablet Take 20 mg by mouth daily.     No current facility-administered medications for this visit.    PHYSICAL EXAMINATION: ECOG PERFORMANCE STATUS: 1 - Symptomatic but completely ambulatory  Vitals:   09/12/23 1140  BP: 120/79  Pulse: (!) 107  Resp: 17  Temp: 97.8 F (36.6 C)  SpO2: 100%   Wt Readings from Last 3 Encounters:  09/12/23 145 lb (65.8 kg)  07/03/23 155 lb 3.3 oz (70.4 kg)  06/30/23 163 lb (73.9 kg)     GENERAL:alert, no distress and comfortable SKIN: skin color, texture, turgor are normal, no rashes or significant lesions EYES: normal, Conjunctiva are pink and non-injected, sclera clear NECK: supple, thyroid normal size, non-tender, without nodularity LYMPH:  no palpable lymphadenopathy in the cervical, axillary  LUNGS: clear to auscultation and percussion with normal breathing effort except crackles on the right side lung. HEART: regular rate & rhythm and no murmurs and no lower extremity edema ABDOMEN:abdomen soft, non-tender and normal bowel sounds Musculoskeletal:no cyanosis of digits and no clubbing  NEURO: alert & oriented x 3 with fluent speech, no focal motor/sensory deficits  Physical Exam   LABORATORY DATA:  I have reviewed the data as listed    Latest Ref Rng & Units 09/05/2023   10:27 AM 07/07/2023    5:08 AM 07/06/2023    7:38 AM  CBC  WBC 4.0 - 10.5 K/uL 6.5  8.0  9.3   Hemoglobin 13.0 - 17.0 g/dL 86.7  89.1  88.9   Hematocrit 39.0 - 52.0 % 40.0  32.4  32.2   Platelets 150 - 400 K/uL 184  175  167         Latest Ref Rng & Units 09/05/2023   10:27 AM 07/07/2023    5:08 AM  07/06/2023    1:21 PM  CMP  Glucose 70 - 99 mg/dL 94  890  870   BUN 8 - 23 mg/dL 13  8  8    Creatinine 0.61 - 1.24 mg/dL 8.82  8.82  8.75   Sodium 135 - 145 mmol/L 138  137  138   Potassium 3.5 - 5.1 mmol/L 4.3  3.9  3.4   Chloride 98 - 111 mmol/L 105  105  107   CO2 22 - 32 mmol/L 28  23  22  Calcium 8.9 - 10.3 mg/dL 9.9  8.5  8.6   Total Protein 6.5 - 8.1 g/dL 8.3  6.2  6.4   Total Bilirubin 0.0 - 1.2 mg/dL 0.4  0.9  0.5   Alkaline Phos 38 - 126 U/L 76  59  54   AST 15 - 41 U/L 16  46  42   ALT 0 - 44 U/L 6  25  20        RADIOGRAPHIC STUDIES: I have personally reviewed the radiological images as listed and agreed with the findings in the report. No results found.    No orders of the defined types were placed in this encounter.  All questions were answered. The patient knows to call the clinic with any problems, questions or concerns. No barriers to learning was detected. The total time spent in the appointment was 40 minutes, including review of chart and various tests results, discussions about plan of care and coordination of care plan     Onita Mattock, MD 09/12/2023

## 2023-09-14 ENCOUNTER — Other Ambulatory Visit: Payer: Self-pay

## 2023-09-14 DIAGNOSIS — C3491 Malignant neoplasm of unspecified part of right bronchus or lung: Secondary | ICD-10-CM

## 2023-09-14 NOTE — Progress Notes (Signed)
 As per Dr. Lanny, order was placed in portal for Guardant Reveal. Kit and paperwork were placed in lab to be drawn on 07/14. Confirmation was received that paperwork was attached with order in portal.

## 2023-09-19 ENCOUNTER — Inpatient Hospital Stay

## 2023-09-19 DIAGNOSIS — C3491 Malignant neoplasm of unspecified part of right bronchus or lung: Secondary | ICD-10-CM

## 2023-09-28 ENCOUNTER — Encounter: Payer: Self-pay | Admitting: Hematology

## 2023-09-28 ENCOUNTER — Telehealth: Payer: Self-pay

## 2023-09-28 NOTE — Telephone Encounter (Signed)
 Spoke with patient's wife regarding a CD of the recent CT scan needed for the patient's pulmonologist, Dr. India Hope. [facility] and requested that a CD be prepared for the patient. Instructed patient's wife to go to the [facility] entrance and check in at the main desk, where they will direct her to the radiology department to pick up the CD. She voiced understanding and expressed thanks.

## 2023-09-29 LAB — GUARDANT REVEAL

## 2023-09-30 ENCOUNTER — Ambulatory Visit: Payer: Self-pay | Admitting: Hematology

## 2023-10-03 ENCOUNTER — Telehealth: Payer: Self-pay

## 2023-10-03 ENCOUNTER — Other Ambulatory Visit: Payer: Self-pay

## 2023-10-03 NOTE — Telephone Encounter (Signed)
 Spoke with pt and wife via telephone regarding recent Guardant Reveal results.  Informed pt and spouse that the results were negative which means there were NO circulating tumor DNA found in the pt's blood.  Pt and spouse were glad to hear the great news!  Pt and spouse verbalized understanding and had no further questions or concerns at this time.

## 2023-10-04 ENCOUNTER — Ambulatory Visit: Admitting: Internal Medicine

## 2023-10-06 ENCOUNTER — Inpatient Hospital Stay: Admitting: Internal Medicine

## 2023-10-06 VITALS — BP 124/78 | HR 85 | Temp 98.0°F | Resp 17 | Ht 68.0 in | Wt 148.0 lb

## 2023-10-06 DIAGNOSIS — C3490 Malignant neoplasm of unspecified part of unspecified bronchus or lung: Secondary | ICD-10-CM | POA: Diagnosis not present

## 2023-10-06 DIAGNOSIS — C3431 Malignant neoplasm of lower lobe, right bronchus or lung: Secondary | ICD-10-CM | POA: Diagnosis not present

## 2023-10-06 NOTE — Progress Notes (Signed)
 [facility] [phone]   [phone]  OFFICE PROGRESS NOTE  [name], NP [address] [place] [address]  DIAGNOSIS: Recurrent non-small cell lung cancer, squamous cell carcinoma presented with right lower lobe lung nodule in August 2021 initially diagnosed as stage IIB (T3, N0, M0) non-small cell lung cancer, invasive well-differentiated squamous cell carcinoma presented with right middle lobe lung mass in May 2019 .  PRIOR THERAPY:  1) Status post right VATS, right middle lobectomy with mediastinal lymph node sampling under the care of Dr. Kerrin on 09/19/2017.  The tumor measured 4.2 cm but the carcinoma extends through the visceral pleura. 2) Adjuvant systemic chemotherapy with carboplatin  for AUC of 6 and paclitaxel  200 mg/M2 every 3 weeks.  First dose 11/16/2017.  Status post 3 cycles.  Last dose was giving January 05, 2018 discontinued secondary to intolerance with significant peripheral neuropathy. 3) curative radiotherapy to the right lower lobe pulmonary nodule under the care of Dr. Patrcia completed on December 21, 2019.  CURRENT THERAPY: Observation.  INTERVAL HISTORY: Danny Duke 88 y.o. male returns to the clinic today for follow-up visit accompanied by his wife.Discussed the use of AI scribe software for clinical note transcription with the patient, who gave verbal consent to proceed.  History of Present Illness Danny Duke is an 88 year old male with recurrent non-small cell lung cancer who presents for evaluation and repeat CT scan for restaging of his disease.  He has a history of non-small cell lung cancer initially diagnosed in May 2019, with recurrence noted in August 2021. He underwent curative stereotactic body radiation therapy (SBRT) to a right lower lobe lung nodule in October 2021 and has been under observation since then.  He feels good overall with no new complaints since his last visit. No  chest pain, breathing issues, nausea, vomiting, or diarrhea. He experiences occasional shortness of breath, which he attributes to the treatments for lung cancer.  He had pneumonia several months ago, but there are no current symptoms related to this.  He recently had a cataract removed from his left eye on Monday and is wearing dark glasses as a result.     MEDICAL HISTORY: Past Medical History:  Diagnosis Date   AAA (abdominal aortic aneurysm) (HCC)    Chronic back pain    Chronic kidney disease    Hypercholesterolemia    Hypertension    Right Lung dx'd 09/2017   s/p RMLobectomy 2019 and adjuvant chemotherapy and SBRT    ALLERGIES:  has no known allergies.  MEDICATIONS:  Current Outpatient Medications  Medication Sig Dispense Refill   acetaminophen  (TYLENOL ) 500 MG tablet Take 1,000 mg by mouth every 6 (six) hours as needed for moderate pain or headache.     allopurinol  (ZYLOPRIM ) 100 MG tablet Take 100 mg by mouth daily.     amLODipine  (NORVASC ) 10 MG tablet Take 0.5 tablets (5 mg total) by mouth daily. 30 tablet 0   amoxicillin -clavulanate (AUGMENTIN ) 875-125 MG tablet Take 1 tablet by mouth 2 (two) times daily. 14 tablet 0   aspirin  EC 81 MG tablet Take 81 mg by mouth daily.     DULoxetine  (CYMBALTA ) 60 MG capsule TAKE 1 CAPSULE(60 MG) BY MOUTH DAILY 30 capsule 3   FARXIGA  10 MG TABS tablet Take 10 mg by mouth daily.     gabapentin  (NEURONTIN ) 400 MG capsule Take 1 capsule (400 mg total) by mouth 3 (three) times daily. 90 capsule 0  metoprolol  succinate (TOPROL -XL) 25 MG 24 hr tablet Take 1 tablet (25 mg total) by mouth daily. 60 tablet 1   omeprazole (PRILOSEC) 40 MG capsule Take 40 mg by mouth daily before breakfast.     Oxycodone  HCl 20 MG TABS Take 1 tablet by mouth every 6 (six) hours as needed.     simvastatin  (ZOCOR ) 20 MG tablet Take 20 mg by mouth daily.     No current facility-administered medications for this visit.    SURGICAL HISTORY:  Past Surgical  History:  Procedure Laterality Date   ABDOMINAL AORTIC ENDOVASCULAR STENT GRAFT Right 02/03/2022   Procedure: ABDOMINAL AORTIC ENDOVASCULAR STENT GRAFT;  Surgeon: Serene Gaile ORN, MD;  Location: [facility] OR;  Service: Vascular;  Laterality: Right;   BRONCHIAL NEEDLE ASPIRATION BIOPSY  08/05/2017   Procedure: BRONCHIAL NEEDLE ASPIRATION BIOPSIES;  Surgeon: Alaine Vicenta NOVAK, MD;  Location: [facility];  Service: Cardiopulmonary;;   ENDOBRONCHIAL ULTRASOUND Bilateral 08/05/2017   Procedure: ENDOBRONCHIAL ULTRASOUND;  Surgeon: Alaine Vicenta NOVAK, MD;  Location: [facility];  Service: Cardiopulmonary;  Laterality: Bilateral;   IR IMAGING GUIDED PORT INSERTION  12/27/2017   IR REMOVAL TUN ACCESS W/ PORT W/O FL MOD SED  03/25/2020   MEDIASTINOSCOPY N/A 09/19/2017   Procedure: MEDIASTINOSCOPY;  Surgeon: Kerrin Elspeth BROCKS, MD;  Location: [facility] OR;  Service: Thoracic;  Laterality: N/A;   MULTIPLE TOOTH EXTRACTIONS     VIDEO ASSISTED THORACOSCOPY (VATS)/ LOBECTOMY Right 09/19/2017   Procedure: RIGHT VIDEO ASSISTED THORACOSCOPY (VATS)/ LOBECTOMY;  Surgeon: Kerrin Elspeth BROCKS, MD;  Location: [facility] OR;  Service: Thoracic;  Laterality: Right;    REVIEW OF SYSTEMS:  A comprehensive review of systems was negative except for: Respiratory: positive for dyspnea on exertion   PHYSICAL EXAMINATION: General appearance: alert, cooperative, fatigued, and no distress Head: Normocephalic, without obvious abnormality, atraumatic Neck: no adenopathy, no JVD, supple, symmetrical, trachea midline, and thyroid not enlarged, symmetric, no tenderness/mass/nodules Lymph nodes: Cervical, supraclavicular, and axillary nodes normal. Resp: clear to auscultation bilaterally Back: symmetric, no curvature. ROM normal. No CVA tenderness. Cardio: regular rate and rhythm, S1, S2 normal, no murmur, click, rub or gallop GI: soft, non-tender; bowel sounds normal; no masses,  no organomegaly Extremities: extremities normal, atraumatic, no  cyanosis or edema  ECOG PERFORMANCE STATUS: 1 - Symptomatic but completely ambulatory  Blood pressure 124/78, pulse 85, temperature 98 F (36.7 C), temperature source Temporal, resp. rate 17, height 5' 8 (1.727 m), weight 148 lb (67.1 kg), SpO2 100%.  LABORATORY DATA: Lab Results  Component Value Date   WBC 6.5 09/05/2023   HGB 13.2 09/05/2023   HCT 40.0 09/05/2023   MCV 92.8 09/05/2023   PLT 184 09/05/2023      Chemistry      Component Value Date/Time   NA 138 09/05/2023 1027   K 4.3 09/05/2023 1027   CL 105 09/05/2023 1027   CO2 28 09/05/2023 1027   BUN 13 09/05/2023 1027   CREATININE 1.17 09/05/2023 1027      Component Value Date/Time   CALCIUM 9.9 09/05/2023 1027   ALKPHOS 76 09/05/2023 1027   AST 16 09/05/2023 1027   ALT 6 09/05/2023 1027   BILITOT 0.4 09/05/2023 1027       RADIOGRAPHIC STUDIES: No results found.    ASSESSMENT AND PLAN: This is a very pleasant 88 years old African-American male with likely recurrent lung cancer initially diagnosed as stage IIB (T3, N0, M0) invasive well-differentiated squamous cell carcinoma presented with right middle lobe lung mass status post right  middle lobectomy with lymph node sampling on September 19, 2017 under the care of Dr. Kerrin. The patient underwent adjuvant treatment with systemic chemotherapy with carboplatin  for AUC of 6 and paclitaxel  200 mg/M2 every 3 weeks with Neulasta  support status post 3 cycles. He tolerated this treatment well except for the chemotherapy-induced peripheral neuropathy and he discontinued his treatment after cycle #3. He underwent CT-guided core biopsy of the enlarging right lower lobe lung nodule and the final pathology was consistent with squamous cell carcinoma. He underwent SBRT to the right lower lobe lung nodule by Dr. Patrcia and tolerated the procedure fairly well. He is currently on observation.  The patient is feeling fine today with no concerning complaints except for mild  shortness of breath with exertion. He had repeat CT scan of the chest performed recently.  I personally independently reviewed the scan and discussed the results with the patient today.  His scan showed no concerning findings for disease recurrence or metastasis.  He also had a guardant reveal test ordered by one of my partners and this came back negative for any circulating tumor DNA. Assessment and Plan Assessment & Plan Recurrent non-small cell lung cancer of the right lower lobe under surveillance Recurrent non-small cell lung cancer of the right lower lobe, initially diagnosed in May 2019, with recurrence in August 2021. Most recently treated with curative SPRT in October 2015. Currently under surveillance with no new concerning findings on recent CT scan. Guardian Reveal blood test was negative. No current symptoms such as chest pain, dyspnea, nausea, vomiting, or diarrhea. Occasional shortness of breath likely related to previous treatments and past pneumonia. - Continue surveillance with repeat CT scan in six months He was advised to call immediately if he has any other concerning symptoms in the interval. The patient voices understanding of current disease status and treatment options and is in agreement with the current care plan.  All questions were answered. The patient knows to call the clinic with any problems, questions or concerns. We can certainly see the patient much sooner if necessary.  Disclaimer: This note was dictated with voice recognition software. Similar sounding words can inadvertently be transcribed and may not be corrected upon review.

## 2023-10-29 ENCOUNTER — Other Ambulatory Visit: Payer: Self-pay

## 2023-10-29 ENCOUNTER — Encounter (HOSPITAL_COMMUNITY): Payer: Self-pay | Admitting: Emergency Medicine

## 2023-10-29 ENCOUNTER — Emergency Department (HOSPITAL_COMMUNITY)

## 2023-10-29 ENCOUNTER — Emergency Department (HOSPITAL_COMMUNITY)
Admission: EM | Admit: 2023-10-29 | Discharge: 2023-10-29 | Disposition: A | Discharge status: Home / Self Care | Attending: Emergency Medicine | Admitting: Emergency Medicine

## 2023-10-29 DIAGNOSIS — Z7982 Long term (current) use of aspirin: Secondary | ICD-10-CM | POA: Diagnosis not present

## 2023-10-29 DIAGNOSIS — J984 Other disorders of lung: Secondary | ICD-10-CM | POA: Diagnosis not present

## 2023-10-29 DIAGNOSIS — R079 Chest pain, unspecified: Secondary | ICD-10-CM | POA: Diagnosis present

## 2023-10-29 DIAGNOSIS — Z87891 Personal history of nicotine dependence: Secondary | ICD-10-CM | POA: Insufficient documentation

## 2023-10-29 DIAGNOSIS — Z85118 Personal history of other malignant neoplasm of bronchus and lung: Secondary | ICD-10-CM | POA: Insufficient documentation

## 2023-10-29 LAB — TROPONIN I (HIGH SENSITIVITY)
Troponin I (High Sensitivity): 3 ng/L (ref <18)
Troponin I (High Sensitivity): 4 ng/L (ref <18)

## 2023-10-29 LAB — BASIC METABOLIC PANEL WITH GFR
Anion gap: 6 (ref 5–15)
BUN: 11 mg/dL (ref 8–23)
CO2: 25 mmol/L (ref 22–32)
Calcium: 9 mg/dL (ref 8.9–10.3)
Chloride: 103 mmol/L (ref 98–111)
Creatinine, Ser: 1.22 mg/dL (ref 0.61–1.24)
GFR, Estimated: 58 mL/min — ABNORMAL LOW (ref >60)
Glucose, Bld: 103 mg/dL — ABNORMAL HIGH (ref 70–99)
Potassium: 3.9 mmol/L (ref 3.5–5.1)
Sodium: 134 mmol/L — ABNORMAL LOW (ref 135–145)

## 2023-10-29 LAB — CBC
HCT: 39.7 % (ref 39.0–52.0)
Hemoglobin: 12.5 g/dL — ABNORMAL LOW (ref 13.0–17.0)
MCH: 30.3 pg (ref 26.0–34.0)
MCHC: 31.5 g/dL (ref 30.0–36.0)
MCV: 96.1 fL (ref 80.0–100.0)
Platelets: 180 K/uL (ref 150–400)
RBC: 4.13 MIL/uL — ABNORMAL LOW (ref 4.22–5.81)
RDW: 13.9 % (ref 11.5–15.5)
WBC: 6.3 K/uL (ref 4.0–10.5)
nRBC: 0 % (ref 0.0–0.2)

## 2023-10-29 MED ORDER — IOHEXOL 350 MG/ML SOLN
75.0000 mL | Freq: Once | INTRAVENOUS | Status: AC | PRN
  Administered 2023-10-29: 75 mL via INTRAVENOUS

## 2023-10-29 NOTE — Discharge Instructions (Signed)
 Seen in the emergency department for right-sided chest pain.  You had blood work EKG and chest x-ray, CAT scan of your chest.  There was no evidence of heart attack or blood clot.  Your CAT scan showed some chronic changes in your right lung with some signs of fluid in the lung cavity lesion.  This will need to be followed up with your oncologist.

## 2023-10-29 NOTE — ED Triage Notes (Signed)
 Pt arrives w/ right sided chest pain that began 20 mins ago. Denies cardiac hx. Reports pain has subsided at this time.

## 2023-10-29 NOTE — ED Provider Notes (Signed)
  EMERGENCY DEPARTMENT AT [facility] Provider Note   CSN: [id number] Arrival date & time: 10/29/23  1910     Patient presents with: Chest Pain   Danny Duke is a 88 y.o. male.  He has a history of lung cancer, right and had a resection.  Former smoker.  He said he was walking today when he began experiencing some severe sharp stabbing right sided chest pain.  Symptoms lasted about 10 or 15 minutes and improved after he sat and rested.  Not there now.  Has had a chronic cough since April when he had pneumonia.  No fevers or chills.  No known coronary disease.  No leg pain or swelling.  No history of DVT or PE.  {Add pertinent medical, surgical, social history, OB history to YEP:67052} The history is provided by the patient.  Chest Pain Pain location:  R chest Pain quality: stabbing   Pain radiates to:  Does not radiate Pain severity:  Severe Onset quality:  Sudden Duration:  15 minutes Progression:  Resolved Chronicity:  New Relieved by:  Rest Worsened by:  Exertion Ineffective treatments:  None tried Associated symptoms: cough   Associated symptoms: no abdominal pain, no diaphoresis, no dizziness, no fever, no nausea, no shortness of breath and no vomiting        Prior to Admission medications   Medication Sig Start Date End Date Taking? Authorizing Provider  acetaminophen  (TYLENOL ) 500 MG tablet Take 1,000 mg by mouth every 6 (six) hours as needed for moderate pain or headache.    [provider]  allopurinol  (ZYLOPRIM ) 100 MG tablet Take 100 mg by mouth daily. 10/23/21   [provider]  amLODipine  (NORVASC ) 10 MG tablet Take 0.5 tablets (5 mg total) by mouth daily. 07/08/23   Ghimire, Donalda CHRISTELLA, MD  amoxicillin -clavulanate (AUGMENTIN ) 875-125 MG tablet Take 1 tablet by mouth 2 (two) times daily. 09/12/23   Lanny Callander, MD  aspirin  EC 81 MG tablet Take 81 mg by mouth daily.    [provider]  DULoxetine  (CYMBALTA ) 60 MG capsule  TAKE 1 CAPSULE(60 MG) BY MOUTH DAILY 07/05/23   Vaslow, Zachary K, MD  FARXIGA  10 MG TABS tablet Take 10 mg by mouth daily. 01/19/22   [provider]  gabapentin  (NEURONTIN ) 400 MG capsule Take 1 capsule (400 mg total) by mouth 3 (three) times daily. 07/08/23   Ghimire, Donalda CHRISTELLA, MD  metoprolol  succinate (TOPROL -XL) 25 MG 24 hr tablet Take 1 tablet (25 mg total) by mouth daily. 09/26/17   Gold, Wayne E, PA-C  omeprazole (PRILOSEC) 40 MG capsule Take 40 mg by mouth daily before breakfast.    [provider]  Oxycodone  HCl 20 MG TABS Take 1 tablet by mouth every 6 (six) hours as needed. 06/27/23   [provider]  simvastatin  (ZOCOR ) 20 MG tablet Take 20 mg by mouth daily.    [provider]    Allergies: Patient has no known allergies.    Review of Systems  Constitutional:  Negative for diaphoresis and fever.  Respiratory:  Positive for cough. Negative for shortness of breath.   Cardiovascular:  Positive for chest pain.  Gastrointestinal:  Negative for abdominal pain, nausea and vomiting.  Neurological:  Negative for dizziness.    Updated Vital Signs BP 111/74 (BP Location: Right Arm)   Pulse 77   Temp 98.4 F (36.9 C) (Oral)   Resp 17   SpO2 97%   Physical Exam Vitals and nursing note  reviewed.  Constitutional:      General: He is not in acute distress.    Appearance: He is well-developed.  HENT:     Head: Normocephalic and atraumatic.  Eyes:     Conjunctiva/sclera: Conjunctivae normal.  Cardiovascular:     Rate and Rhythm: Normal rate and regular rhythm.     Heart sounds: No murmur heard. Pulmonary:     Effort: Pulmonary effort is normal. No respiratory distress.     Breath sounds: Normal breath sounds.  Abdominal:     Palpations: Abdomen is soft.     Tenderness: There is no abdominal tenderness.  Musculoskeletal:        General: No swelling.     Cervical back: Neck supple.     Right lower leg: No tenderness. No edema.     Left lower  leg: No tenderness. No edema.  Skin:    General: Skin is warm and dry.     Capillary Refill: Capillary refill takes less than 2 seconds.  Neurological:     General: No focal deficit present.     Mental Status: He is alert.     (all labs ordered are listed, but only abnormal results are displayed) Labs Reviewed  BASIC METABOLIC PANEL WITH GFR  CBC  TROPONIN I (HIGH SENSITIVITY)    EKG: EKG Interpretation Date/Time:  Saturday October 29 2023 19:21:20 EDT Ventricular Rate:  76 PR Interval:  149 QRS Duration:  80 QT Interval:  373 QTC Calculation: 420 R Axis:   43  Text Interpretation: Sinus rhythm Abnormal R-wave progression, early transition Borderline T abnormalities, lateral leads No significant change since prior 4/25 Confirmed by Towana Sharper [phone]) on 10/29/2023 7:24:18 PM  Radiology: No results found.  {Document cardiac monitor, telemetry assessment procedure when appropriate:32947} Procedures   Medications Ordered in the ED - No data to display    {Click here for ABCD2, HEART and other calculators REFRESH Note before signing:1}                              Medical Decision Making Amount and/or Complexity of Data Reviewed Labs: ordered. Radiology: ordered.   This patient complains of ***; this involves an extensive number of treatment Options and is a complaint that carries with it a high risk of complications and morbidity. The differential includes ***  I ordered, reviewed and interpreted labs, which included *** I ordered medication *** and reviewed PMP when indicated. I ordered imaging studies which included *** and I independently    visualized and interpreted imaging which showed *** Additional history obtained from *** Previous records obtained and reviewed *** I consulted *** and discussed lab and imaging findings and discussed disposition.  Cardiac monitoring reviewed, *** Social determinants considered, *** Critical Interventions:  ***  After the interventions stated above, I reevaluated the patient and found *** Admission and further testing considered, ***   {Document critical care time when appropriate  Document review of labs and clinical decision tools ie CHADS2VASC2, etc  Document your independent review of radiology images and any outside records  Document your discussion with family members, caretakers and with consultants  Document social determinants of health affecting pt's care  Document your decision making why or why not admission, treatments were needed:32947:::1}   Final diagnoses:  None    ED Discharge Orders     None

## 2023-11-01 ENCOUNTER — Telehealth: Payer: Self-pay | Admitting: Medical Oncology

## 2023-11-01 NOTE — Telephone Encounter (Signed)
 Patient reports pain when coughing. States the pain originates on the left side and radiates to the front. Patient was recently evaluated in the Emergency Department for similar symptoms and was advised to follow up with Oncology.  CT-IMPRESSION: 1. No evidence of pulmonary embolism. 2. Peripheral parenchymal consolidation posteriorly in the right lung and thick-walled cavitary lesion at the right lung base with new fluid level. 3. Partial right pneumonectomy with volume loss and mediastinal shift as before.  Wife states pt may have seen Dr Vinie Hope, MD Pulmonary . I recommended to contact his office for f/u.  Wife voiced understanding.

## 2023-11-02 ENCOUNTER — Telehealth: Payer: Self-pay

## 2023-11-02 NOTE — Telephone Encounter (Signed)
 8/27 @ 2:10 pm Received voicemail, patient's wife called to have CD made of recent images.  Called and forward call to [facility] (Radiology dept), so they are aware.  Also phone number was given to Radiology.

## 2023-11-22 ENCOUNTER — Other Ambulatory Visit: Payer: Self-pay

## 2023-11-29 ENCOUNTER — Other Ambulatory Visit: Payer: Self-pay | Admitting: Internal Medicine

## 2023-12-23 ENCOUNTER — Encounter: Payer: Self-pay | Admitting: Internal Medicine

## 2023-12-28 ENCOUNTER — Other Ambulatory Visit: Payer: Self-pay

## 2023-12-28 DIAGNOSIS — C3490 Malignant neoplasm of unspecified part of unspecified bronchus or lung: Secondary | ICD-10-CM

## 2023-12-28 NOTE — Progress Notes (Signed)
 Called patient to set up lab appointment to have his Guardant Reveal drawn as per Dr. Lanny. Set the app for 10/30 at 1100 as per patients wife. They had no further questions at this time. Paper work was taken to lab and order placed in [employer].

## 2023-12-29 ENCOUNTER — Ambulatory Visit

## 2023-12-29 DIAGNOSIS — B351 Tinea unguium: Secondary | ICD-10-CM

## 2023-12-29 DIAGNOSIS — L603 Nail dystrophy: Secondary | ICD-10-CM | POA: Diagnosis not present

## 2023-12-29 DIAGNOSIS — G620 Drug-induced polyneuropathy: Secondary | ICD-10-CM

## 2023-12-29 DIAGNOSIS — T451X5A Adverse effect of antineoplastic and immunosuppressive drugs, initial encounter: Secondary | ICD-10-CM | POA: Diagnosis not present

## 2023-12-29 NOTE — Progress Notes (Signed)
 Subjective:  Patient ID: Danny Duke, male    DOB: [birth year],  MRN: [id number]  Chief Complaint  Patient presents with   Nail Problem    Rm10 Patient  says toenails are thick and discoloration/no pain    88 y.o. male presents with the above complaint.  He relates to a history of cancer that has been successfully treated via radiation and chemo.  Since finishing the chemo, he has noticed burning and tingling in his feet and cannot feel the bottom of them consistently.  They are here for concerns about his sensation as well as his toenails which are thickened, dystrophic.  Review of Systems: Negative except as noted in the HPI. Denies N/V/F/Ch.  Past Medical History:  Diagnosis Date   AAA (abdominal aortic aneurysm)    Chronic back pain    Chronic kidney disease    Hypercholesterolemia    Hypertension    Right Lung dx'd 09/2017   s/p RMLobectomy 2019 and adjuvant chemotherapy and SBRT    Current Outpatient Medications:    acetaminophen  (TYLENOL ) 500 MG tablet, Take 1,000 mg by mouth every 6 (six) hours as needed for moderate pain or headache., Disp: , Rfl:    allopurinol  (ZYLOPRIM ) 100 MG tablet, Take 100 mg by mouth daily., Disp: , Rfl:    amLODipine  (NORVASC ) 10 MG tablet, Take 0.5 tablets (5 mg total) by mouth daily., Disp: 30 tablet, Rfl: 0   amoxicillin -clavulanate (AUGMENTIN ) 875-125 MG tablet, Take 1 tablet by mouth 2 (two) times daily., Disp: 14 tablet, Rfl: 0   aspirin  EC 81 MG tablet, Take 81 mg by mouth daily., Disp: , Rfl:    DULoxetine  (CYMBALTA ) 60 MG capsule, TAKE 1 CAPSULE(60 MG) BY MOUTH DAILY, Disp: 30 capsule, Rfl: 3   FARXIGA  10 MG TABS tablet, Take 10 mg by mouth daily., Disp: , Rfl:    gabapentin  (NEURONTIN ) 400 MG capsule, Take 1 capsule (400 mg total) by mouth 3 (three) times daily., Disp: 90 capsule, Rfl: 0   metoprolol  succinate (TOPROL -XL) 25 MG 24 hr tablet, Take 1 tablet (25 mg total) by mouth daily., Disp: 60 tablet, Rfl: 1   omeprazole (PRILOSEC) 40  MG capsule, Take 40 mg by mouth daily before breakfast., Disp: , Rfl:    Oxycodone  HCl 20 MG TABS, Take 1 tablet by mouth every 6 (six) hours as needed., Disp: , Rfl:    simvastatin  (ZOCOR ) 20 MG tablet, Take 20 mg by mouth daily., Disp: , Rfl:   Social History   Tobacco Use  Smoking Status Former   Current packs/day: 0.00   Average packs/day: 0.5 packs/day for 40.0 years (20.0 ttl pk-yrs)   Types: Cigarettes   Start date: 03/08/1957   Quit date: 03/08/1997   Years since quitting: 26.8  Smokeless Tobacco Never    No Known Allergies Objective:  There were no vitals filed for this visit. There is no height or weight on file to calculate BMI. Constitutional Well developed. Well nourished. Oriented to person, place, and time.  Vascular Dorsalis pedis pulses faintly palpable bilaterally. Posterior tibial pulses faintly palpable bilaterally. Capillary refill normal to all digits.  No cyanosis or clubbing noted. Pedal hair growth normal.  Neurologic Normal speech. Epicritic sensation to light touch grossly absent bilaterally.  No protective sensation via SWMF testing. Negative tinel sign at tarsal tunnel bilaterally.   Dermatologic Skin texture and turgor are within normal limits.  No open wounds. No skin lesions. Right hallux nail pincer nail shape without ingrowing or pain to palpation.  No wounds.  Nail is thickened, dystrophic, crumbly and texture.  Musculoskeletal: 5/5 muscle strength to all major pedal muscle groups. Ambulates unassisted. No contributing deformity.     Assessment:   1. Chemotherapy-induced neuropathy   2. Onychomycosis   3. Nail dystrophy    Plan:  - Patient was evaluated and treated and all questions answered.  Chemotherapy-induced neuropathy -Discussed with the patient diagnosis of chemotherapy-induced neuropathy.  He does express understanding and relates to an extended history of dealing with this.  He does not have protective sensation.  I did  discuss the necessity of visually evaluating the feet regularly to make sure no pathology is occurring. We discussed dosage of gabapentin  and how it has a wide dose range.   Onychomycosis/nail dystrophy - Discussed with the patient the diagnosis of onychomycosis versus nail dystrophy versus chemotherapy related nail changes.  He does express understanding of this.  We discussed treating possible fungal element utilizing terbinafine, he would like to hold off on this for now.  Today, I debrided his right hallux nail of thickness, shape without incident and to patient satisfaction.  RTC 1 year for routine foot care and neuropathic foot exam  Prentice Ovens, DPM AACFAS Fellowship Trained Podiatric Surgeon [facility]

## 2024-01-05 ENCOUNTER — Inpatient Hospital Stay: Attending: Internal Medicine

## 2024-01-11 ENCOUNTER — Telehealth: Payer: Self-pay

## 2024-01-11 NOTE — Telephone Encounter (Signed)
 Received call from pt in regards to missed 01/05/24 lab appt. Scheduling message sent to reschedule.

## 2024-01-18 ENCOUNTER — Telehealth: Payer: Self-pay | Admitting: Internal Medicine

## 2024-01-18 NOTE — Telephone Encounter (Signed)
 Rescheduled patient's missed lab appointment from 10/30. Called and spoke with the patients wife, they will be here tomorrow to get that labs done.

## 2024-01-19 ENCOUNTER — Inpatient Hospital Stay: Attending: Internal Medicine

## 2024-01-19 DIAGNOSIS — C3490 Malignant neoplasm of unspecified part of unspecified bronchus or lung: Secondary | ICD-10-CM

## 2024-01-27 ENCOUNTER — Encounter: Payer: Self-pay | Admitting: Hematology

## 2024-01-27 LAB — GUARDANT REVEAL

## 2024-02-08 ENCOUNTER — Other Ambulatory Visit: Payer: Self-pay

## 2024-02-22 ENCOUNTER — Other Ambulatory Visit: Payer: Self-pay

## 2024-02-22 NOTE — Progress Notes (Signed)
 As per Dr. Lanny, called patient to relay results of patient Guardant Reveal, results were negative, patient voiced full understanding and had no further questions at this time.

## 2024-03-04 ENCOUNTER — Other Ambulatory Visit: Payer: Self-pay | Admitting: Internal Medicine

## 2024-03-05 ENCOUNTER — Encounter: Payer: Self-pay | Admitting: Internal Medicine

## 2024-03-27 ENCOUNTER — Inpatient Hospital Stay: Attending: Internal Medicine

## 2024-03-27 DIAGNOSIS — C3490 Malignant neoplasm of unspecified part of unspecified bronchus or lung: Secondary | ICD-10-CM

## 2024-03-27 LAB — CMP (CANCER CENTER ONLY)
ALT: 7 U/L (ref 0–44)
AST: 23 U/L (ref 15–41)
Albumin: 4 g/dL (ref 3.5–5.0)
Alkaline Phosphatase: 82 U/L (ref 38–126)
Anion gap: 9 (ref 5–15)
BUN: 12 mg/dL (ref 8–23)
CO2: 25 mmol/L (ref 22–32)
Calcium: 9.8 mg/dL (ref 8.9–10.3)
Chloride: 106 mmol/L (ref 98–111)
Creatinine: 1.19 mg/dL (ref 0.61–1.24)
GFR, Estimated: 59 mL/min — ABNORMAL LOW
Glucose, Bld: 101 mg/dL — ABNORMAL HIGH (ref 70–99)
Potassium: 3.9 mmol/L (ref 3.5–5.1)
Sodium: 140 mmol/L (ref 135–145)
Total Bilirubin: 0.5 mg/dL (ref 0.0–1.2)
Total Protein: 8.4 g/dL — ABNORMAL HIGH (ref 6.5–8.1)

## 2024-03-27 LAB — CBC WITH DIFFERENTIAL (CANCER CENTER ONLY)
Abs Immature Granulocytes: 0.02 K/uL (ref 0.00–0.07)
Basophils Absolute: 0 K/uL (ref 0.0–0.1)
Basophils Relative: 1 %
Eosinophils Absolute: 0.2 K/uL (ref 0.0–0.5)
Eosinophils Relative: 4 %
HCT: 39.5 % (ref 39.0–52.0)
Hemoglobin: 13.2 g/dL (ref 13.0–17.0)
Immature Granulocytes: 0 %
Lymphocytes Relative: 29 %
Lymphs Abs: 1.9 K/uL (ref 0.7–4.0)
MCH: 30.3 pg (ref 26.0–34.0)
MCHC: 33.4 g/dL (ref 30.0–36.0)
MCV: 90.6 fL (ref 80.0–100.0)
Monocytes Absolute: 0.8 K/uL (ref 0.1–1.0)
Monocytes Relative: 13 %
Neutro Abs: 3.5 K/uL (ref 1.7–7.7)
Neutrophils Relative %: 53 %
Platelet Count: 140 K/uL — ABNORMAL LOW (ref 150–400)
RBC: 4.36 MIL/uL (ref 4.22–5.81)
RDW: 13.6 % (ref 11.5–15.5)
WBC Count: 6.5 K/uL (ref 4.0–10.5)
nRBC: 0 % (ref 0.0–0.2)

## 2024-03-29 ENCOUNTER — Ambulatory Visit (HOSPITAL_COMMUNITY)
Admission: RE | Admit: 2024-03-29 | Discharge: 2024-03-29 | Disposition: A | Discharge status: Home / Self Care | Source: Ambulatory Visit | Attending: Internal Medicine | Admitting: Internal Medicine

## 2024-03-29 DIAGNOSIS — C3490 Malignant neoplasm of unspecified part of unspecified bronchus or lung: Secondary | ICD-10-CM | POA: Diagnosis present

## 2024-04-04 ENCOUNTER — Inpatient Hospital Stay: Admitting: Internal Medicine

## 2024-04-04 VITALS — BP 131/81 | HR 87 | Temp 98.1°F | Resp 17 | Ht 68.0 in | Wt 139.2 lb

## 2024-04-04 DIAGNOSIS — C3491 Malignant neoplasm of unspecified part of right bronchus or lung: Secondary | ICD-10-CM

## 2024-04-04 NOTE — Progress Notes (Signed)
 "     Danny Duke [phone]   [phone]  OFFICE PROGRESS NOTE  [name], NP [address] [place] [address]  DIAGNOSIS: Recurrent non-small cell lung cancer, squamous cell carcinoma presented with right lower lobe lung nodule in August 2021 initially diagnosed as stage IIB (T3, N0, M0) non-small cell lung cancer, invasive well-differentiated squamous cell carcinoma presented with right middle lobe lung mass in May 2019 .  PRIOR THERAPY:  1) Status post right VATS, right middle lobectomy with mediastinal lymph node sampling under the care of Dr. Kerrin on 09/19/2017.  The tumor measured 4.2 cm but the carcinoma extends through the visceral pleura. 2) Adjuvant systemic chemotherapy with carboplatin  for AUC of 6 and paclitaxel  200 mg/M2 every 3 weeks.  First dose 11/16/2017.  Status post 3 cycles.  Last dose was giving January 05, 2018 discontinued secondary to intolerance with significant peripheral neuropathy. 3) curative radiotherapy to the right lower lobe pulmonary nodule under the care of Dr. Patrcia completed on December 21, 2019.  CURRENT THERAPY: Observation.  INTERVAL HISTORY: Danny Duke 89 y.o. male returns to the clinic today for follow-up visit accompanied by his wife.Discussed the use of AI scribe software for clinical note transcription with the patient, who gave verbal consent to proceed.  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  He was initially diagnosed with non-small cell lung cancer, squamous cell carcinoma, in May 2019, and underwent right middle lobectomy with mediastinal lymph node dissection in July 2019, followed by systemic chemotherapy. Disease recurrence was identified in August 2021, and he subsequently received curative radiotherapy to a right lower lobe  pulmonary nodule in October 2021. He has been under observation since that time.  He reports no new symptoms since his last visit. Respiratory status remains stable, managed with inhalers for chronic obstructive pulmonary disease. He does not require supplemental oxygen at home. He denies chest pain, hemoptysis, nausea, vomiting, diarrhea, or headaches.    MEDICAL HISTORY: Past Medical History:  Diagnosis Date   AAA (abdominal aortic aneurysm)    Chronic back pain    Chronic kidney disease    Hypercholesterolemia    Hypertension    Right Lung dx'd 09/2017   s/p RMLobectomy 2019 and adjuvant chemotherapy and SBRT    ALLERGIES:  has no known allergies.  MEDICATIONS:  Current Outpatient Medications  Medication Sig Dispense Refill   acetaminophen  (TYLENOL ) 500 MG tablet Take 1,000 mg by mouth every 6 (six) hours as needed for moderate pain or headache.     allopurinol  (ZYLOPRIM ) 100 MG tablet Take 100 mg by mouth daily.     amLODipine  (NORVASC ) 10 MG tablet Take 0.5 tablets (5 mg total) by mouth daily. 30 tablet 0   amoxicillin -clavulanate (AUGMENTIN ) 875-125 MG tablet Take 1 tablet by mouth 2 (two) times daily. 14 tablet 0   aspirin  EC 81 MG tablet Take 81 mg by mouth daily.     DULoxetine  (CYMBALTA ) 60 MG capsule TAKE 1 CAPSULE(60 MG) BY MOUTH DAILY 30 capsule 3   FARXIGA  10 MG TABS tablet Take 10 mg by mouth daily.     gabapentin  (NEURONTIN ) 400 MG capsule Take 1 capsule (400 mg total) by mouth 3 (three) times daily. 90 capsule 0   metoprolol  succinate (TOPROL -XL) 25 MG 24 hr tablet Take 1 tablet (25 mg total) by mouth daily.  60 tablet 1   omeprazole (PRILOSEC) 40 MG capsule Take 40 mg by mouth daily before breakfast.     Oxycodone  HCl 20 MG TABS Take 1 tablet by mouth every 6 (six) hours as needed.     simvastatin  (ZOCOR ) 20 MG tablet Take 20 mg by mouth daily.     No current facility-administered medications for this visit.    SURGICAL HISTORY:  Past Surgical History:   Procedure Laterality Date   ABDOMINAL AORTIC ENDOVASCULAR STENT GRAFT Right 02/03/2022   Procedure: ABDOMINAL AORTIC ENDOVASCULAR STENT GRAFT;  Surgeon: Serene Gaile ORN, MD;  Location: [facility] OR;  Service: Vascular;  Laterality: Right;   BRONCHIAL NEEDLE ASPIRATION BIOPSY  08/05/2017   Procedure: BRONCHIAL NEEDLE ASPIRATION BIOPSIES;  Surgeon: Alaine Vicenta NOVAK, MD;  Location: [facility];  Service: Cardiopulmonary;;   ENDOBRONCHIAL ULTRASOUND Bilateral 08/05/2017   Procedure: ENDOBRONCHIAL ULTRASOUND;  Surgeon: Alaine Vicenta NOVAK, MD;  Location: [facility];  Service: Cardiopulmonary;  Laterality: Bilateral;   IR IMAGING GUIDED PORT INSERTION  12/27/2017   IR REMOVAL TUN ACCESS W/ PORT W/O FL MOD SED  03/25/2020   MEDIASTINOSCOPY N/A 09/19/2017   Procedure: MEDIASTINOSCOPY;  Surgeon: Kerrin Elspeth BROCKS, MD;  Location: [facility] OR;  Service: Thoracic;  Laterality: N/A;   MULTIPLE TOOTH EXTRACTIONS     VIDEO ASSISTED THORACOSCOPY (VATS)/ LOBECTOMY Right 09/19/2017   Procedure: RIGHT VIDEO ASSISTED THORACOSCOPY (VATS)/ LOBECTOMY;  Surgeon: Kerrin Elspeth BROCKS, MD;  Location: [facility] OR;  Service: Thoracic;  Laterality: Right;    REVIEW OF SYSTEMS:  A comprehensive review of systems was negative except for: Respiratory: positive for dyspnea on exertion   PHYSICAL EXAMINATION: General appearance: alert, cooperative, fatigued, and no distress Head: Normocephalic, without obvious abnormality, atraumatic Neck: no adenopathy, no JVD, supple, symmetrical, trachea midline, and thyroid not enlarged, symmetric, no tenderness/mass/nodules Lymph nodes: Cervical, supraclavicular, and axillary nodes normal. Resp: clear to auscultation bilaterally Back: symmetric, no curvature. ROM normal. No CVA tenderness. Cardio: regular rate and rhythm, S1, S2 normal, no murmur, click, rub or gallop GI: soft, non-tender; bowel sounds normal; no masses,  no organomegaly Extremities: extremities normal, atraumatic, no cyanosis or  edema  ECOG PERFORMANCE STATUS: 1 - Symptomatic but completely ambulatory  Blood pressure 131/81, pulse 87, temperature 98.1 F (36.7 C), temperature source Temporal, resp. rate 17, height 5' 8 (1.727 m), weight 139 lb 3.2 oz (63.1 kg), SpO2 96%.  LABORATORY DATA: Lab Results  Component Value Date   WBC 6.5 03/27/2024   HGB 13.2 03/27/2024   HCT 39.5 03/27/2024   MCV 90.6 03/27/2024   PLT 140 (L) 03/27/2024      Chemistry      Component Value Date/Time   NA 140 03/27/2024 0950   K 3.9 03/27/2024 0950   CL 106 03/27/2024 0950   CO2 25 03/27/2024 0950   BUN 12 03/27/2024 0950   CREATININE 1.19 03/27/2024 0950      Component Value Date/Time   CALCIUM 9.8 03/27/2024 0950   ALKPHOS 82 03/27/2024 0950   AST 23 03/27/2024 0950   ALT 7 03/27/2024 0950   BILITOT 0.5 03/27/2024 0950       RADIOGRAPHIC STUDIES: CT Chest Wo Contrast Result Date: 03/29/2024 CLINICAL DATA:  Recurrent non-small-cell lung cancer. Right lower lobe nodule in 2021. Right middle lobe lung mass in 2019. * Tracking Code: BO * EXAM: CT CHEST WITHOUT CONTRAST TECHNIQUE: Multidetector CT imaging of the chest was performed following the standard protocol without IV contrast. RADIATION DOSE REDUCTION: This exam was performed according  to the departmental dose-optimization program which includes automated exposure control, adjustment of the mA and/or kV according to patient size and/or use of iterative reconstruction technique. COMPARISON:  10/29/2023 FINDINGS: Cardiovascular: Aortic atherosclerosis. Tortuous thoracic aorta. Normal heart size, without pericardial effusion. Lad and right coronary artery calcification. Mediastinum/Nodes: Right paratracheal node measures 1.6 cm on 42/2 is similar to 1.7 cm on the prior. Calcified mediastinal and bilateral hilar nodes are likely related to old granulomatous disease. Hilar regions poorly evaluated without intravenous contrast. The esophagus is mildly dilated with fluid level  within on image 76/2. Lungs/Pleura: Right-sided pleural thickening, without pleural fluid. Hyperattenuation within the right pleural space could represent calcification or sequelae of prior talc pleurodesis. Advanced centrilobular emphysema. Right upper lobe wedge resection. Right middle lobectomy. Biapical pleuroparenchymal scarring. Bilateral, right-greater-than-left upper lobe linear/band and nodular densities which are favored to be postinfectious/inflammatory and similar including back to 09/05/2023. Example at maximally 1.4 cm on image 34/7 within the right upper lobe. Right lower lobe cavitary process is similar in size and distribution. Wall thickening and intracavitary fluid posteriorly have improved. Example 106/7 today. Surrounding bronchial wall thickening, mucoid impaction are decreased. Upper Abdomen: Subcentimeter hepatic cysts. Normal imaged portions of the spleen, stomach, pancreas, gallbladder, adrenal glands. Low-density bilateral renal lesions of up to 2.6 cm are likely cysts. No follow-up indicated. Incompletely imaged aortic stent graft repair. Musculoskeletal: Seventh posterolateral right rib fracture is similar to the prior exam, but incompletely healed. Lower cervical spondylosis with accentuation of expected thoracic kyphosis. IMPRESSION: 1. Status post right upper lobe wedge resection and right middle lobectomy. 2. Extensive bilateral, upper lobe predominant scarring and presumably postinfectious/inflammatory nodularity. This decreases sensitivity for early recurrent or metastatic disease. 3. Right lower lobe cavitary process with surrounding airspace, ground-glass opacity and bronchial wall thickening. Likely necrotic pneumonia, including fungal or mycobacterial etiologies. Improvement in posterior cavitary wall thickening and fluid. 4. Similar mild right paratracheal adenopathy, likely reactive. 5. Chronic seventh posterolateral right rib fracture. 6. Esophageal air fluid level suggests  dysmotility or gastroesophageal reflux. 7. Aortic atherosclerosis (ICD10-I70.0), coronary artery atherosclerosis and emphysema (ICD10-J43.9). Electronically Signed   By: Rockey Kilts M.D.   On: 03/29/2024 18:21      ASSESSMENT AND PLAN: This is a very pleasant 89 years old African-American male with likely recurrent lung cancer initially diagnosed as stage IIB (T3, N0, M0) invasive well-differentiated squamous cell carcinoma presented with right middle lobe lung mass status post right middle lobectomy with lymph node sampling on September 19, 2017 under the care of Dr. Kerrin. The patient underwent adjuvant treatment with systemic chemotherapy with carboplatin  for AUC of 6 and paclitaxel  200 mg/M2 every 3 weeks with Neulasta  support status post 3 cycles. He tolerated this treatment well except for the chemotherapy-induced peripheral neuropathy and he discontinued his treatment after cycle #3. He underwent CT-guided core biopsy of the enlarging right lower lobe lung nodule and the final pathology was consistent with squamous cell carcinoma. He underwent SBRT to the right lower lobe lung nodule by Dr. Patrcia and tolerated the procedure fairly well. He is currently on observation.  The patient is feeling fine today with no concerning complaints except for mild shortness of breath with exertion. He had repeat CT scan of the chest performed recently.  At bedside independently reviewed the scan and discussed the results with the patient and his wife today.  His scan showed no concerning findings for disease progression. Assessment and Plan Assessment & Plan Recurrent squamous cell carcinoma of the right lung Recurrent  squamous cell carcinoma of the right lung, status post mediastinal lymph node dissection, systemic chemotherapy, and curative radiotherapy. Disease remains stable on surveillance with no evidence of progression on recent chest CT. ECOG performance status 0; he is fully active without  restriction and reports no new symptoms. - Reviewed chest CT for restaging, which demonstrated stable disease. - Provided copy of scan results. - Scheduled follow-up in six months with repeat chest CT for surveillance.  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 - Reviewed imaging findings of stable fibrosis. - Provided reassurance regarding unchanged disease.  Chronic obstructive pulmonary disease Chronic obstructive pulmonary disease managed with inhalers. He remains active, does not require supplemental oxygen, and reports no new respiratory symptoms. - Confirmed continued inhaler use for COPD management. - Assessed for supplemental oxygen requirement; not indicated at this time. - Encouraged continued activity and monitoring for new symptoms. The patient was advised to call immediately if he has any other concerning symptoms in the interval.  The patient voices understanding of current disease status and treatment options and is in agreement with the current care plan.  All questions were answered. The patient knows to call the clinic with any problems, questions or concerns. We can certainly see the patient much sooner if necessary.  Disclaimer: This note was dictated with voice recognition software. Similar sounding words can inadvertently be transcribed and may not be corrected upon review.       "

## 2024-10-02 ENCOUNTER — Inpatient Hospital Stay: Attending: Internal Medicine

## 2024-10-02 ENCOUNTER — Inpatient Hospital Stay: Admitting: Internal Medicine
